# Patient Record
Sex: Male | Born: 1955
Health system: Southern US, Community
[De-identification: ages and names within clinical notes are randomized; demographics above are authoritative.]

## PROBLEM LIST (undated history)

## (undated) DIAGNOSIS — R0602 Shortness of breath: Secondary | ICD-10-CM

## (undated) DIAGNOSIS — G4730 Sleep apnea, unspecified: Secondary | ICD-10-CM

## (undated) DIAGNOSIS — I2510 Atherosclerotic heart disease of native coronary artery without angina pectoris: Secondary | ICD-10-CM

## (undated) DIAGNOSIS — I739 Peripheral vascular disease, unspecified: Secondary | ICD-10-CM

## (undated) DIAGNOSIS — C4490 Unspecified malignant neoplasm of skin, unspecified: Secondary | ICD-10-CM

## (undated) DIAGNOSIS — C3490 Malignant neoplasm of unspecified part of unspecified bronchus or lung: Secondary | ICD-10-CM

## (undated) DIAGNOSIS — I219 Acute myocardial infarction, unspecified: Secondary | ICD-10-CM

## (undated) DIAGNOSIS — I10 Essential (primary) hypertension: Secondary | ICD-10-CM

## (undated) DIAGNOSIS — D126 Benign neoplasm of colon, unspecified: Secondary | ICD-10-CM

## (undated) DIAGNOSIS — K219 Gastro-esophageal reflux disease without esophagitis: Secondary | ICD-10-CM

## (undated) DIAGNOSIS — E785 Hyperlipidemia, unspecified: Secondary | ICD-10-CM

## (undated) DIAGNOSIS — J449 Chronic obstructive pulmonary disease, unspecified: Secondary | ICD-10-CM

## (undated) HISTORY — DX: Gastro-esophageal reflux disease without esophagitis: K21.9

## (undated) HISTORY — PX: INGUINAL HERNIA REPAIR: SUR1180

## (undated) HISTORY — DX: Atherosclerotic heart disease of native coronary artery without angina pectoris: I25.10

## (undated) HISTORY — DX: Benign neoplasm of colon, unspecified: D12.6

## (undated) HISTORY — DX: Hyperlipidemia, unspecified: E78.5

## (undated) HISTORY — PX: HERNIA REPAIR: SHX51

## (undated) HISTORY — PX: ACHILLES TENDON SURGERY: SHX542

## (undated) HISTORY — DX: Peripheral vascular disease, unspecified: I73.9

## (undated) HISTORY — PX: LUNG SURGERY: SHX703

## (undated) HISTORY — PX: CORONARY ARTERY BYPASS GRAFT: SHX141

---

## 1998-01-26 ENCOUNTER — Emergency Department (HOSPITAL_COMMUNITY): Admission: EM | Admit: 1998-01-26 | Discharge: 1998-01-26 | Payer: Self-pay | Admitting: Emergency Medicine

## 1998-07-16 ENCOUNTER — Emergency Department (HOSPITAL_COMMUNITY): Admission: EM | Admit: 1998-07-16 | Discharge: 1998-07-16 | Payer: Self-pay | Admitting: Emergency Medicine

## 1998-07-16 ENCOUNTER — Encounter: Payer: Self-pay | Admitting: Emergency Medicine

## 1998-09-14 ENCOUNTER — Emergency Department (HOSPITAL_COMMUNITY): Admission: EM | Admit: 1998-09-14 | Discharge: 1998-09-14 | Payer: Self-pay | Admitting: Emergency Medicine

## 2000-02-26 ENCOUNTER — Emergency Department (HOSPITAL_COMMUNITY): Admission: EM | Admit: 2000-02-26 | Discharge: 2000-02-26 | Payer: Self-pay | Admitting: Emergency Medicine

## 2000-07-19 DIAGNOSIS — I219 Acute myocardial infarction, unspecified: Secondary | ICD-10-CM

## 2000-07-19 HISTORY — DX: Acute myocardial infarction, unspecified: I21.9

## 2000-08-16 ENCOUNTER — Inpatient Hospital Stay (HOSPITAL_COMMUNITY): Admission: EM | Admit: 2000-08-16 | Discharge: 2000-08-22 | Payer: Self-pay | Admitting: Emergency Medicine

## 2000-08-18 ENCOUNTER — Encounter: Payer: Self-pay | Admitting: Thoracic Surgery (Cardiothoracic Vascular Surgery)

## 2000-08-18 HISTORY — PX: CORONARY ARTERY BYPASS GRAFT: SHX141

## 2000-08-19 ENCOUNTER — Encounter: Payer: Self-pay | Admitting: Thoracic Surgery (Cardiothoracic Vascular Surgery)

## 2000-08-20 ENCOUNTER — Encounter: Payer: Self-pay | Admitting: Thoracic Surgery (Cardiothoracic Vascular Surgery)

## 2000-09-14 ENCOUNTER — Encounter (HOSPITAL_COMMUNITY): Admission: RE | Admit: 2000-09-14 | Discharge: 2000-12-13 | Payer: Self-pay | Admitting: Cardiology

## 2000-10-19 DIAGNOSIS — I219 Acute myocardial infarction, unspecified: Secondary | ICD-10-CM

## 2000-10-19 HISTORY — DX: Acute myocardial infarction, unspecified: I21.9

## 2000-10-19 HISTORY — PX: CORONARY ARTERY BYPASS GRAFT: SHX141

## 2000-10-31 ENCOUNTER — Encounter: Payer: Self-pay | Admitting: Emergency Medicine

## 2000-10-31 ENCOUNTER — Emergency Department (HOSPITAL_COMMUNITY): Admission: EM | Admit: 2000-10-31 | Discharge: 2000-10-31 | Payer: Self-pay | Admitting: Emergency Medicine

## 2000-11-30 ENCOUNTER — Encounter (HOSPITAL_COMMUNITY): Admission: RE | Admit: 2000-11-30 | Discharge: 2001-02-11 | Payer: Self-pay | Admitting: Cardiology

## 2001-04-27 ENCOUNTER — Ambulatory Visit (HOSPITAL_BASED_OUTPATIENT_CLINIC_OR_DEPARTMENT_OTHER): Admission: RE | Admit: 2001-04-27 | Discharge: 2001-04-27 | Payer: Self-pay | Admitting: *Deleted

## 2001-11-15 ENCOUNTER — Encounter (HOSPITAL_COMMUNITY): Admission: RE | Admit: 2001-11-15 | Discharge: 2002-02-13 | Payer: Self-pay | Admitting: Cardiology

## 2003-03-02 ENCOUNTER — Encounter: Admission: RE | Admit: 2003-03-02 | Discharge: 2003-03-02 | Payer: Self-pay | Admitting: Family Medicine

## 2003-03-02 ENCOUNTER — Encounter: Payer: Self-pay | Admitting: Family Medicine

## 2003-12-14 ENCOUNTER — Inpatient Hospital Stay (HOSPITAL_COMMUNITY): Admission: EM | Admit: 2003-12-14 | Discharge: 2003-12-18 | Payer: Self-pay | Admitting: Emergency Medicine

## 2004-01-24 ENCOUNTER — Ambulatory Visit (HOSPITAL_COMMUNITY): Admission: RE | Admit: 2004-01-24 | Discharge: 2004-01-24 | Payer: Self-pay | Admitting: Cardiology

## 2004-02-22 ENCOUNTER — Emergency Department (HOSPITAL_COMMUNITY): Admission: EM | Admit: 2004-02-22 | Discharge: 2004-02-22 | Payer: Self-pay | Admitting: Internal Medicine

## 2005-07-14 ENCOUNTER — Ambulatory Visit (HOSPITAL_COMMUNITY): Admission: RE | Admit: 2005-07-14 | Discharge: 2005-07-14 | Payer: Self-pay | Admitting: Gastroenterology

## 2005-07-14 ENCOUNTER — Encounter (INDEPENDENT_AMBULATORY_CARE_PROVIDER_SITE_OTHER): Payer: Self-pay | Admitting: *Deleted

## 2006-02-17 ENCOUNTER — Encounter: Payer: Self-pay | Admitting: *Deleted

## 2008-10-19 HISTORY — PX: LUNG SURGERY: SHX703

## 2008-12-25 ENCOUNTER — Emergency Department (HOSPITAL_COMMUNITY): Admission: EM | Admit: 2008-12-25 | Discharge: 2008-12-25 | Payer: Self-pay | Admitting: *Deleted

## 2008-12-31 ENCOUNTER — Ambulatory Visit: Admission: RE | Admit: 2008-12-31 | Discharge: 2008-12-31 | Payer: Self-pay | Admitting: Thoracic Surgery

## 2009-01-02 ENCOUNTER — Ambulatory Visit: Payer: Self-pay | Admitting: Thoracic Surgery

## 2009-01-02 ENCOUNTER — Ambulatory Visit (HOSPITAL_COMMUNITY): Admission: RE | Admit: 2009-01-02 | Discharge: 2009-01-02 | Payer: Self-pay | Admitting: Thoracic Surgery

## 2009-01-28 ENCOUNTER — Encounter: Payer: Self-pay | Admitting: Thoracic Surgery (Cardiothoracic Vascular Surgery)

## 2009-01-28 ENCOUNTER — Ambulatory Visit: Payer: Self-pay | Admitting: Thoracic Surgery (Cardiothoracic Vascular Surgery)

## 2009-01-28 ENCOUNTER — Ambulatory Visit: Payer: Self-pay | Admitting: Vascular Surgery

## 2009-01-29 ENCOUNTER — Inpatient Hospital Stay (HOSPITAL_COMMUNITY)
Admission: RE | Admit: 2009-01-29 | Discharge: 2009-02-04 | Payer: Self-pay | Admitting: Thoracic Surgery (Cardiothoracic Vascular Surgery)

## 2009-01-29 ENCOUNTER — Encounter: Payer: Self-pay | Admitting: Thoracic Surgery (Cardiothoracic Vascular Surgery)

## 2009-01-29 ENCOUNTER — Ambulatory Visit: Payer: Self-pay | Admitting: Thoracic Surgery (Cardiothoracic Vascular Surgery)

## 2009-01-29 HISTORY — PX: LUNG SURGERY: SHX703

## 2009-02-08 ENCOUNTER — Ambulatory Visit: Payer: Self-pay | Admitting: Thoracic Surgery (Cardiothoracic Vascular Surgery)

## 2009-02-13 ENCOUNTER — Ambulatory Visit: Payer: Self-pay | Admitting: Thoracic Surgery

## 2009-02-13 ENCOUNTER — Ambulatory Visit: Payer: Self-pay | Admitting: Internal Medicine

## 2009-02-13 ENCOUNTER — Encounter: Admission: RE | Admit: 2009-02-13 | Discharge: 2009-02-13 | Payer: Self-pay | Admitting: Thoracic Surgery

## 2009-02-25 LAB — COMPREHENSIVE METABOLIC PANEL
ALT: 29 U/L (ref 0–53)
BUN: 13 mg/dL (ref 6–23)
CO2: 21 mEq/L (ref 19–32)
Calcium: 9.3 mg/dL (ref 8.4–10.5)
Chloride: 107 mEq/L (ref 96–112)
Creatinine, Ser: 0.91 mg/dL (ref 0.40–1.50)
Glucose, Bld: 99 mg/dL (ref 70–99)
Potassium: 4.6 mEq/L (ref 3.5–5.3)
Sodium: 139 mEq/L (ref 135–145)

## 2009-02-25 LAB — CBC WITH DIFFERENTIAL/PLATELET
EOS%: 6.1 % (ref 0.0–7.0)
MCH: 33.3 pg (ref 27.2–33.4)
MCHC: 34.6 g/dL (ref 32.0–36.0)
MCV: 96.3 fL (ref 79.3–98.0)
MONO%: 8.4 % (ref 0.0–14.0)
RBC: 4.34 10*6/uL (ref 4.20–5.82)
RDW: 13.9 % (ref 11.0–14.6)

## 2009-03-04 ENCOUNTER — Ambulatory Visit: Payer: Self-pay | Admitting: Thoracic Surgery (Cardiothoracic Vascular Surgery)

## 2009-03-04 ENCOUNTER — Encounter
Admission: RE | Admit: 2009-03-04 | Discharge: 2009-03-04 | Payer: Self-pay | Admitting: Thoracic Surgery (Cardiothoracic Vascular Surgery)

## 2009-03-21 LAB — CBC WITH DIFFERENTIAL/PLATELET
Basophils Absolute: 0 10*3/uL (ref 0.0–0.1)
Eosinophils Absolute: 0 10*3/uL (ref 0.0–0.5)
HGB: 14.2 g/dL (ref 13.0–17.1)
MCV: 95.2 fL (ref 79.3–98.0)
MONO%: 1.6 % (ref 0.0–14.0)
NEUT#: 17.4 10*3/uL — ABNORMAL HIGH (ref 1.5–6.5)
RBC: 4.35 10*6/uL (ref 4.20–5.82)
RDW: 13.9 % (ref 11.0–14.6)
WBC: 18.9 10*3/uL — ABNORMAL HIGH (ref 4.0–10.3)
lymph#: 1.2 10*3/uL (ref 0.9–3.3)
nRBC: 0 % (ref 0–0)

## 2009-03-21 LAB — COMPREHENSIVE METABOLIC PANEL
ALT: 27 U/L (ref 0–53)
Albumin: 4 g/dL (ref 3.5–5.2)
Alkaline Phosphatase: 55 U/L (ref 39–117)
CO2: 18 mEq/L — ABNORMAL LOW (ref 19–32)
Glucose, Bld: 167 mg/dL — ABNORMAL HIGH (ref 70–99)
Potassium: 4.2 mEq/L (ref 3.5–5.3)
Sodium: 136 mEq/L (ref 135–145)
Total Bilirubin: 0.3 mg/dL (ref 0.3–1.2)
Total Protein: 6.6 g/dL (ref 6.0–8.3)

## 2009-03-21 LAB — MAGNESIUM: Magnesium: 2 mg/dL (ref 1.5–2.5)

## 2009-03-26 ENCOUNTER — Ambulatory Visit: Payer: Self-pay | Admitting: Internal Medicine

## 2009-03-26 ENCOUNTER — Ambulatory Visit: Payer: Self-pay | Admitting: Surgery

## 2009-03-26 ENCOUNTER — Encounter: Admission: RE | Admit: 2009-03-26 | Discharge: 2009-03-26 | Payer: Self-pay | Admitting: Surgery

## 2009-03-28 LAB — COMPREHENSIVE METABOLIC PANEL
Alkaline Phosphatase: 96 U/L (ref 39–117)
BUN: 34 mg/dL — ABNORMAL HIGH (ref 6–23)
CO2: 19 mEq/L (ref 19–32)
Creatinine, Ser: 1.46 mg/dL (ref 0.40–1.50)
Glucose, Bld: 97 mg/dL (ref 70–99)
Sodium: 137 mEq/L (ref 135–145)
Total Bilirubin: 0.4 mg/dL (ref 0.3–1.2)
Total Protein: 6.5 g/dL (ref 6.0–8.3)

## 2009-03-28 LAB — CBC WITH DIFFERENTIAL/PLATELET
BASO%: 0.8 % (ref 0.0–2.0)
EOS%: 0.6 % (ref 0.0–7.0)
HCT: 44.9 % (ref 38.4–49.9)
LYMPH%: 15.1 % (ref 14.0–49.0)
MCH: 33.7 pg — ABNORMAL HIGH (ref 27.2–33.4)
MCHC: 35 g/dL (ref 32.0–36.0)
NEUT%: 75.9 % — ABNORMAL HIGH (ref 39.0–75.0)
Platelets: 256 10*3/uL (ref 140–400)
RBC: 4.66 10*6/uL (ref 4.20–5.82)

## 2009-03-28 LAB — MAGNESIUM: Magnesium: 2.1 mg/dL (ref 1.5–2.5)

## 2009-04-04 LAB — CBC WITH DIFFERENTIAL/PLATELET
Basophils Absolute: 0.1 10*3/uL (ref 0.0–0.1)
EOS%: 0.3 % (ref 0.0–7.0)
HCT: 36.8 % — ABNORMAL LOW (ref 38.4–49.9)
HGB: 13.2 g/dL (ref 13.0–17.1)
MCHC: 35.8 g/dL (ref 32.0–36.0)
MONO%: 4.7 % (ref 0.0–14.0)
NEUT#: 7.9 10*3/uL — ABNORMAL HIGH (ref 1.5–6.5)
Platelets: 248 10*3/uL (ref 140–400)
RDW: 14 % (ref 11.0–14.6)
WBC: 10.1 10*3/uL (ref 4.0–10.3)

## 2009-04-04 LAB — COMPREHENSIVE METABOLIC PANEL
AST: 14 U/L (ref 0–37)
Albumin: 3.6 g/dL (ref 3.5–5.2)
Alkaline Phosphatase: 82 U/L (ref 39–117)
BUN: 16 mg/dL (ref 6–23)
Potassium: 4.1 mEq/L (ref 3.5–5.3)
Sodium: 139 mEq/L (ref 135–145)
Total Bilirubin: 0.4 mg/dL (ref 0.3–1.2)

## 2009-04-11 LAB — CBC WITH DIFFERENTIAL/PLATELET
Basophils Absolute: 0 10*3/uL (ref 0.0–0.1)
Eosinophils Absolute: 0 10*3/uL (ref 0.0–0.5)
HCT: 40.7 % (ref 38.4–49.9)
HGB: 14.4 g/dL (ref 13.0–17.1)
LYMPH%: 6 % — ABNORMAL LOW (ref 14.0–49.0)
MCV: 96.3 fL (ref 79.3–98.0)
MONO%: 0.9 % (ref 0.0–14.0)
NEUT#: 17.8 10*3/uL — ABNORMAL HIGH (ref 1.5–6.5)
NEUT%: 93 % — ABNORMAL HIGH (ref 39.0–75.0)
Platelets: 303 10*3/uL (ref 140–400)
RBC: 4.22 10*6/uL (ref 4.20–5.82)

## 2009-04-11 LAB — COMPREHENSIVE METABOLIC PANEL
ALT: 28 U/L (ref 0–53)
AST: 14 U/L (ref 0–37)
Albumin: 4.1 g/dL (ref 3.5–5.2)
BUN: 15 mg/dL (ref 6–23)
Calcium: 9.9 mg/dL (ref 8.4–10.5)
Chloride: 105 mEq/L (ref 96–112)
Potassium: 4.6 mEq/L (ref 3.5–5.3)
Total Protein: 6.8 g/dL (ref 6.0–8.3)

## 2009-04-18 LAB — COMPREHENSIVE METABOLIC PANEL
BUN: 18 mg/dL (ref 6–23)
CO2: 20 mEq/L (ref 19–32)
Glucose, Bld: 92 mg/dL (ref 70–99)
Sodium: 139 mEq/L (ref 135–145)
Total Bilirubin: 0.5 mg/dL (ref 0.3–1.2)
Total Protein: 6.4 g/dL (ref 6.0–8.3)

## 2009-04-18 LAB — CBC WITH DIFFERENTIAL/PLATELET
BASO%: 0.3 % (ref 0.0–2.0)
Eosinophils Absolute: 0.1 10*3/uL (ref 0.0–0.5)
HCT: 40.5 % (ref 38.4–49.9)
HGB: 14 g/dL (ref 13.0–17.1)
MCHC: 34.7 g/dL (ref 32.0–36.0)
MONO#: 0.8 10*3/uL (ref 0.1–0.9)
NEUT#: 9 10*3/uL — ABNORMAL HIGH (ref 1.5–6.5)
NEUT%: 74.2 % (ref 39.0–75.0)
WBC: 12.1 10*3/uL — ABNORMAL HIGH (ref 4.0–10.3)
lymph#: 2.2 10*3/uL (ref 0.9–3.3)

## 2009-04-18 LAB — MAGNESIUM: Magnesium: 1.6 mg/dL (ref 1.5–2.5)

## 2009-04-25 ENCOUNTER — Ambulatory Visit: Payer: Self-pay | Admitting: Internal Medicine

## 2009-04-25 LAB — COMPREHENSIVE METABOLIC PANEL
ALT: 30 U/L (ref 0–53)
CO2: 19 mEq/L (ref 19–32)
Calcium: 9.5 mg/dL (ref 8.4–10.5)
Chloride: 105 mEq/L (ref 96–112)
Sodium: 137 mEq/L (ref 135–145)
Total Protein: 6.4 g/dL (ref 6.0–8.3)

## 2009-04-25 LAB — CBC WITH DIFFERENTIAL/PLATELET
BASO%: 0.4 % (ref 0.0–2.0)
Eosinophils Absolute: 0 10*3/uL (ref 0.0–0.5)
HCT: 39.5 % (ref 38.4–49.9)
MCHC: 34.7 g/dL (ref 32.0–36.0)
MONO#: 0.6 10*3/uL (ref 0.1–0.9)
NEUT#: 7.7 10*3/uL — ABNORMAL HIGH (ref 1.5–6.5)
NEUT%: 75 % (ref 39.0–75.0)
RBC: 4.13 10*6/uL — ABNORMAL LOW (ref 4.20–5.82)
WBC: 10.2 10*3/uL (ref 4.0–10.3)
lymph#: 2 10*3/uL (ref 0.9–3.3)

## 2009-04-25 LAB — MAGNESIUM: Magnesium: 1.8 mg/dL (ref 1.5–2.5)

## 2009-05-02 LAB — CBC WITH DIFFERENTIAL/PLATELET
BASO%: 0.7 % (ref 0.0–2.0)
EOS%: 0.2 % (ref 0.0–7.0)
MCH: 32.3 pg (ref 27.2–33.4)
MCHC: 34.4 g/dL (ref 32.0–36.0)
RBC: 4.09 10*6/uL — ABNORMAL LOW (ref 4.20–5.82)
RDW: 15.8 % — ABNORMAL HIGH (ref 11.0–14.6)
lymph#: 1.9 10*3/uL (ref 0.9–3.3)

## 2009-05-02 LAB — COMPREHENSIVE METABOLIC PANEL
Albumin: 3.9 g/dL (ref 3.5–5.2)
BUN: 15 mg/dL (ref 6–23)
CO2: 22 mEq/L (ref 19–32)
Calcium: 9.5 mg/dL (ref 8.4–10.5)
Chloride: 108 mEq/L (ref 96–112)
Glucose, Bld: 94 mg/dL (ref 70–99)
Potassium: 4.7 mEq/L (ref 3.5–5.3)
Sodium: 140 mEq/L (ref 135–145)
Total Protein: 5.9 g/dL — ABNORMAL LOW (ref 6.0–8.3)

## 2009-05-27 ENCOUNTER — Ambulatory Visit: Payer: Self-pay | Admitting: Internal Medicine

## 2009-05-27 LAB — COMPREHENSIVE METABOLIC PANEL
BUN: 17 mg/dL (ref 6–23)
CO2: 18 mEq/L — ABNORMAL LOW (ref 19–32)
Creatinine, Ser: 0.94 mg/dL (ref 0.40–1.50)
Glucose, Bld: 157 mg/dL — ABNORMAL HIGH (ref 70–99)
Total Bilirubin: 0.5 mg/dL (ref 0.3–1.2)
Total Protein: 6.6 g/dL (ref 6.0–8.3)

## 2009-05-27 LAB — CBC WITH DIFFERENTIAL/PLATELET
Basophils Absolute: 0 10*3/uL (ref 0.0–0.1)
Eosinophils Absolute: 0 10*3/uL (ref 0.0–0.5)
HCT: 38.6 % (ref 38.4–49.9)
LYMPH%: 9 % — ABNORMAL LOW (ref 14.0–49.0)
MCV: 98.7 fL — ABNORMAL HIGH (ref 79.3–98.0)
MONO#: 0.3 10*3/uL (ref 0.1–0.9)
MONO%: 2.9 % (ref 0.0–14.0)
NEUT#: 10 10*3/uL — ABNORMAL HIGH (ref 1.5–6.5)
NEUT%: 88 % — ABNORMAL HIGH (ref 39.0–75.0)
Platelets: 296 10*3/uL (ref 140–400)
WBC: 11.4 10*3/uL — ABNORMAL HIGH (ref 4.0–10.3)

## 2009-05-27 LAB — MAGNESIUM: Magnesium: 2 mg/dL (ref 1.5–2.5)

## 2009-06-13 ENCOUNTER — Ambulatory Visit (HOSPITAL_COMMUNITY): Admission: RE | Admit: 2009-06-13 | Discharge: 2009-06-13 | Payer: Self-pay | Admitting: Internal Medicine

## 2009-06-25 LAB — COMPREHENSIVE METABOLIC PANEL
Alkaline Phosphatase: 60 U/L (ref 39–117)
CO2: 19 mEq/L (ref 19–32)
Creatinine, Ser: 0.92 mg/dL (ref 0.40–1.50)
Glucose, Bld: 95 mg/dL (ref 70–99)
Sodium: 139 mEq/L (ref 135–145)
Total Bilirubin: 0.6 mg/dL (ref 0.3–1.2)

## 2009-06-25 LAB — CBC WITH DIFFERENTIAL/PLATELET
BASO%: 0.3 % (ref 0.0–2.0)
Eosinophils Absolute: 0.1 10*3/uL (ref 0.0–0.5)
HCT: 37.6 % — ABNORMAL LOW (ref 38.4–49.9)
LYMPH%: 22.5 % (ref 14.0–49.0)
MCHC: 34.7 g/dL (ref 32.0–36.0)
MCV: 102.1 fL — ABNORMAL HIGH (ref 79.3–98.0)
MONO#: 0.5 10*3/uL (ref 0.1–0.9)
MONO%: 7.9 % (ref 0.0–14.0)
NEUT%: 67.3 % (ref 39.0–75.0)
Platelets: 375 10*3/uL (ref 140–400)
RBC: 3.69 10*6/uL — ABNORMAL LOW (ref 4.20–5.82)
WBC: 6.1 10*3/uL (ref 4.0–10.3)

## 2009-10-02 ENCOUNTER — Ambulatory Visit: Payer: Self-pay | Admitting: Internal Medicine

## 2009-10-04 LAB — COMPREHENSIVE METABOLIC PANEL
ALT: 74 U/L — ABNORMAL HIGH (ref 0–53)
Alkaline Phosphatase: 50 U/L (ref 39–117)
Creatinine, Ser: 0.93 mg/dL (ref 0.40–1.50)
Sodium: 139 mEq/L (ref 135–145)
Total Bilirubin: 0.7 mg/dL (ref 0.3–1.2)
Total Protein: 6.6 g/dL (ref 6.0–8.3)

## 2009-10-04 LAB — CBC WITH DIFFERENTIAL/PLATELET
BASO%: 0.6 % (ref 0.0–2.0)
EOS%: 3 % (ref 0.0–7.0)
HCT: 41.6 % (ref 38.4–49.9)
LYMPH%: 24.9 % (ref 14.0–49.0)
MCH: 33.7 pg — ABNORMAL HIGH (ref 27.2–33.4)
MCHC: 34.5 g/dL (ref 32.0–36.0)
MCV: 97.6 fL (ref 79.3–98.0)
MONO#: 0.9 10*3/uL (ref 0.1–0.9)
MONO%: 11.3 % (ref 0.0–14.0)
NEUT%: 60.2 % (ref 39.0–75.0)
Platelets: 253 10*3/uL (ref 140–400)
RBC: 4.27 10*6/uL (ref 4.20–5.82)
WBC: 7.7 10*3/uL (ref 4.0–10.3)

## 2009-10-07 ENCOUNTER — Ambulatory Visit (HOSPITAL_COMMUNITY): Admission: RE | Admit: 2009-10-07 | Discharge: 2009-10-07 | Payer: Self-pay | Admitting: Internal Medicine

## 2009-12-30 ENCOUNTER — Ambulatory Visit: Payer: Self-pay | Admitting: Internal Medicine

## 2010-01-02 ENCOUNTER — Ambulatory Visit (HOSPITAL_COMMUNITY): Admission: RE | Admit: 2010-01-02 | Discharge: 2010-01-02 | Payer: Self-pay | Admitting: Internal Medicine

## 2010-01-02 LAB — COMPREHENSIVE METABOLIC PANEL
ALT: 106 U/L — ABNORMAL HIGH (ref 0–53)
Albumin: 3.8 g/dL (ref 3.5–5.2)
CO2: 23 mEq/L (ref 19–32)
Potassium: 4.4 mEq/L (ref 3.5–5.3)
Sodium: 136 mEq/L (ref 135–145)
Total Bilirubin: 0.8 mg/dL (ref 0.3–1.2)
Total Protein: 6.7 g/dL (ref 6.0–8.3)

## 2010-01-02 LAB — CBC WITH DIFFERENTIAL/PLATELET
BASO%: 0.4 % (ref 0.0–2.0)
Eosinophils Absolute: 0.2 10*3/uL (ref 0.0–0.5)
LYMPH%: 22.5 % (ref 14.0–49.0)
MCHC: 34.9 g/dL (ref 32.0–36.0)
MONO#: 0.5 10*3/uL (ref 0.1–0.9)
NEUT#: 5.1 10*3/uL (ref 1.5–6.5)
Platelets: 224 10*3/uL (ref 140–400)
RBC: 4.34 10*6/uL (ref 4.20–5.82)
RDW: 14.4 % (ref 11.0–14.6)
WBC: 7.4 10*3/uL (ref 4.0–10.3)
lymph#: 1.7 10*3/uL (ref 0.9–3.3)

## 2010-05-01 ENCOUNTER — Ambulatory Visit: Payer: Self-pay | Admitting: Internal Medicine

## 2010-05-05 ENCOUNTER — Ambulatory Visit (HOSPITAL_COMMUNITY): Admission: RE | Admit: 2010-05-05 | Discharge: 2010-05-05 | Payer: Self-pay | Admitting: Internal Medicine

## 2010-05-05 LAB — CBC WITH DIFFERENTIAL/PLATELET
BASO%: 0.6 % (ref 0.0–2.0)
EOS%: 1.7 % (ref 0.0–7.0)
HCT: 41.5 % (ref 38.4–49.9)
LYMPH%: 19.8 % (ref 14.0–49.0)
MCH: 32.6 pg (ref 27.2–33.4)
MCHC: 34.4 g/dL (ref 32.0–36.0)
MCV: 94.7 fL (ref 79.3–98.0)
MONO#: 0.4 10*3/uL (ref 0.1–0.9)
MONO%: 5.2 % (ref 0.0–14.0)
NEUT%: 72.7 % (ref 39.0–75.0)
Platelets: 251 10*3/uL (ref 140–400)
RBC: 4.39 10*6/uL (ref 4.20–5.82)
WBC: 7.4 10*3/uL (ref 4.0–10.3)

## 2010-05-05 LAB — COMPREHENSIVE METABOLIC PANEL
ALT: 92 U/L — ABNORMAL HIGH (ref 0–53)
Albumin: 3.8 g/dL (ref 3.5–5.2)
CO2: 24 mEq/L (ref 19–32)
Calcium: 9.6 mg/dL (ref 8.4–10.5)
Chloride: 108 mEq/L (ref 96–112)
Glucose, Bld: 103 mg/dL — ABNORMAL HIGH (ref 70–99)
Sodium: 139 mEq/L (ref 135–145)
Total Protein: 6.8 g/dL (ref 6.0–8.3)

## 2010-08-13 ENCOUNTER — Inpatient Hospital Stay (HOSPITAL_COMMUNITY): Admission: EM | Admit: 2010-08-13 | Discharge: 2010-08-15 | Payer: Self-pay | Admitting: Emergency Medicine

## 2010-08-13 ENCOUNTER — Ambulatory Visit: Payer: Self-pay | Admitting: Vascular Surgery

## 2010-08-13 ENCOUNTER — Encounter (INDEPENDENT_AMBULATORY_CARE_PROVIDER_SITE_OTHER): Payer: Self-pay | Admitting: Internal Medicine

## 2010-09-02 ENCOUNTER — Ambulatory Visit: Payer: Self-pay | Admitting: Internal Medicine

## 2010-09-04 ENCOUNTER — Ambulatory Visit (HOSPITAL_COMMUNITY): Admission: RE | Admit: 2010-09-04 | Discharge: 2010-09-04 | Payer: Self-pay | Admitting: Internal Medicine

## 2010-09-04 LAB — CBC WITH DIFFERENTIAL/PLATELET
BASO%: 0.7 % (ref 0.0–2.0)
LYMPH%: 21.8 % (ref 14.0–49.0)
MCHC: 35.1 g/dL (ref 32.0–36.0)
MCV: 95.9 fL (ref 79.3–98.0)
MONO#: 0.4 10*3/uL (ref 0.1–0.9)
MONO%: 7.5 % (ref 0.0–14.0)
Platelets: 226 10*3/uL (ref 140–400)
RBC: 3.9 10*6/uL — ABNORMAL LOW (ref 4.20–5.82)
RDW: 14.5 % (ref 11.0–14.6)
WBC: 6 10*3/uL (ref 4.0–10.3)

## 2010-09-04 LAB — COMPREHENSIVE METABOLIC PANEL
ALT: 61 U/L — ABNORMAL HIGH (ref 0–53)
Alkaline Phosphatase: 53 U/L (ref 39–117)
Sodium: 141 mEq/L (ref 135–145)
Total Bilirubin: 0.6 mg/dL (ref 0.3–1.2)
Total Protein: 6.3 g/dL (ref 6.0–8.3)

## 2010-10-30 IMAGING — PT NM PET TUM IMG INITIAL (PI) SKULL BASE T - THIGH
7 series · 25 of 25 positions shown · non-contrast
Comparison: 12/25/2008

CLINICAL DATA: Left lower lobe mass.

NUCLEAR MEDICINE PET/CT
TECHNIQUE: 15.5 mCi F-18 FDG was injected intravenously.  Full-
ring PET imaging was performed from the skull base through the mid-
thighs.  CT data was obtained and used for attenuation correction
and anatomic localization only.  (This was not acquired as a
diagnostic CT examination.)  Data regarding site of injection,
patient weight, post injection waiting period, and fasting blood
sugar is available in the PACS documentation.

[Series 1: pet ac · axial · 3.3mm · 4.69mm/px · z∈[-870,+0]mm · 6 of 267 slices shown]
[im 1/267]
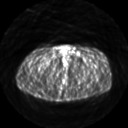
[im 54/267]
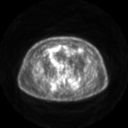
[im 107/267]
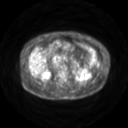
[im 160/267]
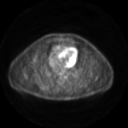
[im 213/267]
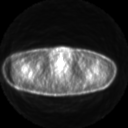
[im 267/267]
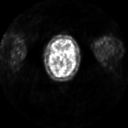

[Series 2: pet nac · axial · 3.3mm · 4.69mm/px · z∈[-870,+0]mm · 5 of 267 slices shown]
[im 1/267]
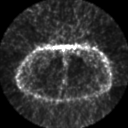
[im 67/267]
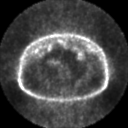
[im 134/267]
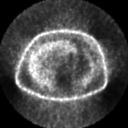
[im 200/267]
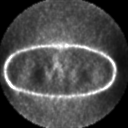
[im 267/267]
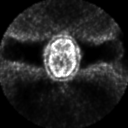

[Series 2: ct images · axial · 3.8mm · 0.98mm/px · z∈[-870,+0]mm · 5 of 267 slices shown]
[im 1/267]
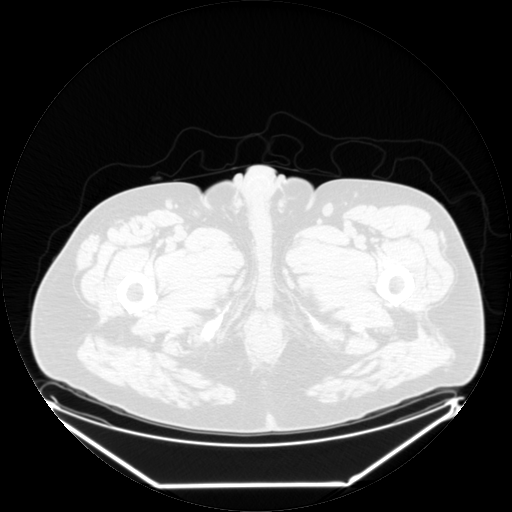
[im 67/267]
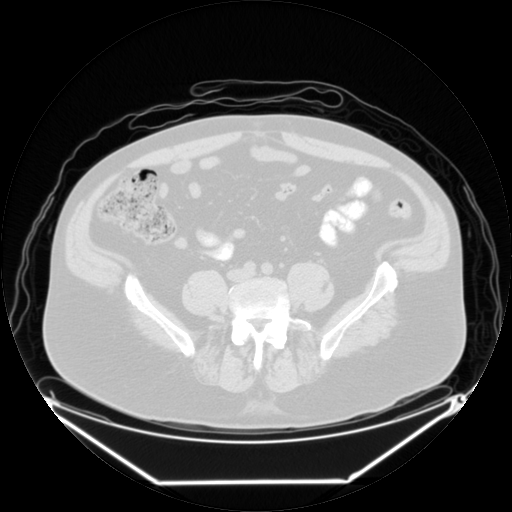
[im 134/267]
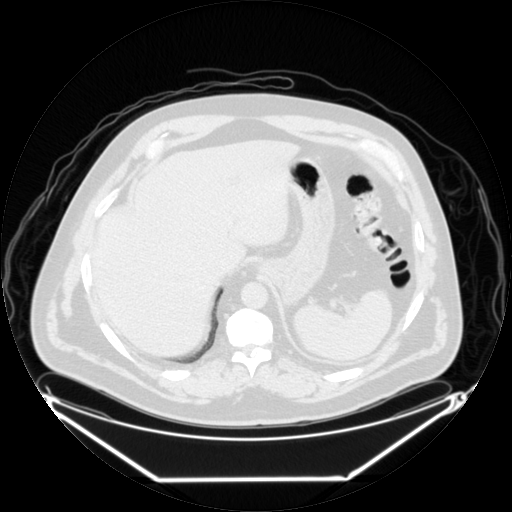
[im 200/267]
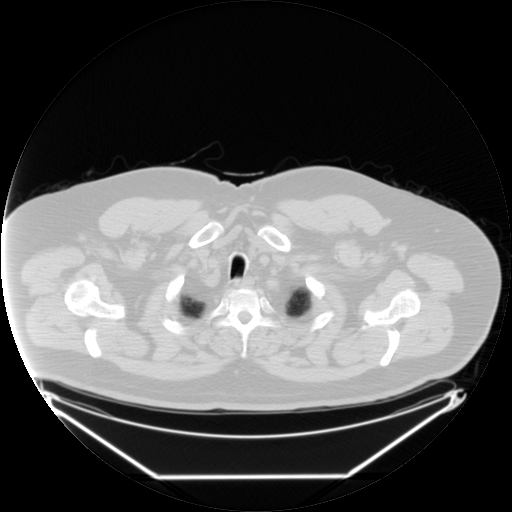
[im 267/267  brain]
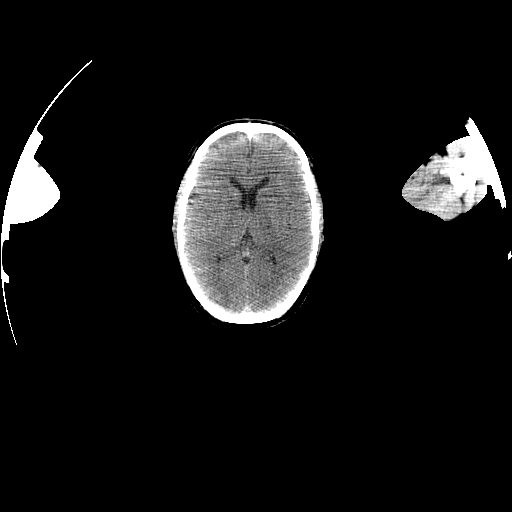

[Series 123: mip · coronal · 3.3mm · 4.69mm/px · 1 of 30 slices shown]
[im 1/30]
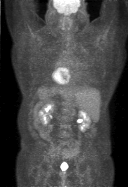

[Series 151: reformatted · axial · 3.3mm · 3.91mm/px · z∈[-324,+0]mm · 2 of 100 slices shown (1 of 3)]
[im 1/100]
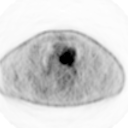
[im 100/100]
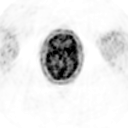

[Series 151: reformatted · axial · 3.3mm · 3.91mm/px · z∈[-870,+0]mm · 5 of 267 slices shown (2 of 3)]
[im 1/267]
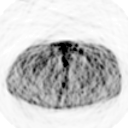
[im 67/267]
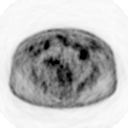
[im 134/267]
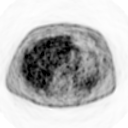
[im 200/267]
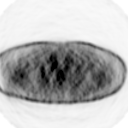
[im 267/267]
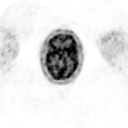

[Series 153: reformatted · coronal · 4.7mm · 6.98mm/px · 1 of 74 slices shown (3 of 3)]
[im 1/74]
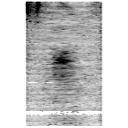

[25 of 25 positions shown; findings below may reference images not displayed]

FINDINGS: Hypermetabolic soft tissue in the vicinity of the
adenoids has a maximum standard uptake value of 9.6.  The soft
tissue density in this vicinity appears relatively symmetric.

A mucous retention cyst is present in the left maxillary sinus.

No adenopathy is identified in the neck.

Activity in the left lower lobe mass is similar to that in the
adjacent pulmonary vascular structures.  Maximum standard uptake
value is 2.8.  No pathologic hypermetabolic mediastinal or hilar
activity is noted.  Prior CABG is noted.

The adrenal glands appear normal.  Lumbar spondylosis noted.  Small
bilateral inguinal lymph nodes are not pathologically enlarged or
hypermetabolic.  Adipose tissue is incidentally noted in the
muscularis appropriate of the urinary bladder, a normal variant.

Lumbar spondylosis is present at L4-5 and L5-S1, with spurring of
the sacroiliac joints (right greater than left) and spurring of the
acetabula.

The adrenal glands appear unremarkable.  No specific hypermetabolic
lesions in the neck, abdomen, or pelvis identified.
IMPRESSION: 1.  Low grade but abnormal activity in the left lower lobe mass.
Although this could be infectious/inflammatory, the possibility of
low grade malignancy or so-called bronchial adenoma associated with
the superior segmental branch of the left lower lobe is raised.
Resection or tissue diagnosis is recommended.
2.  Hypermetabolic activity in the adenoids, of uncertain
significance.  Occasionally adenoidal tissue can persist into
adulthood and, when inflamed, be hypermetabolic.  We do not
demonstrate an asymmetric mass of the adenoids, but visual
inspection and follow-up may be warranted in order to exclude
malignancy.

## 2010-10-30 IMAGING — CT CT HEAD WO/W CM
1 of 3 series · 12 of 30 positions shown, 15 images · IV contrast (agent unspecified)
Comparison: Unenhanced CT 02/22/2004

CLINICAL DATA: Lung mass - evaluate for metastatic disease

CT HEAD WITHOUT AND WITH CONTRAST
TECHNIQUE: Contiguous axial images were obtained from the base of
the skull through the vertex without and with intravenous contrast.
Contrast: 100 ml Fmnipaque-3EE

[Series 3: head w/cm 4.8 h45s · axial · 0.43mm/px · z∈[-121,-7]mm · 12 of 30 slices shown, 15 images]
[im 3/30  brain]
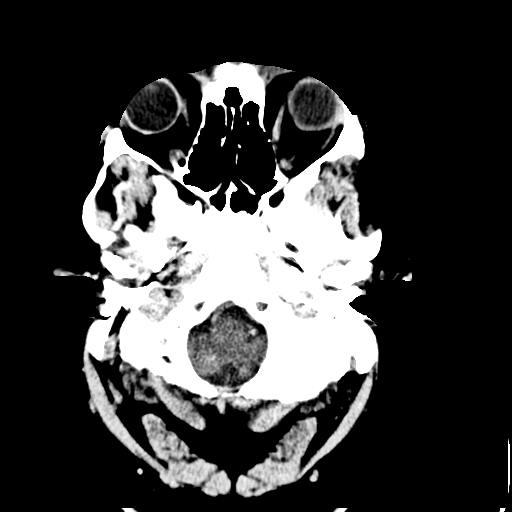
[im 3/30  bone]
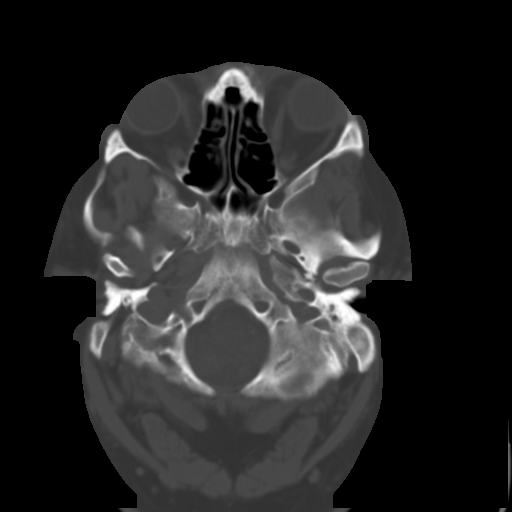
[im 5/30  brain]
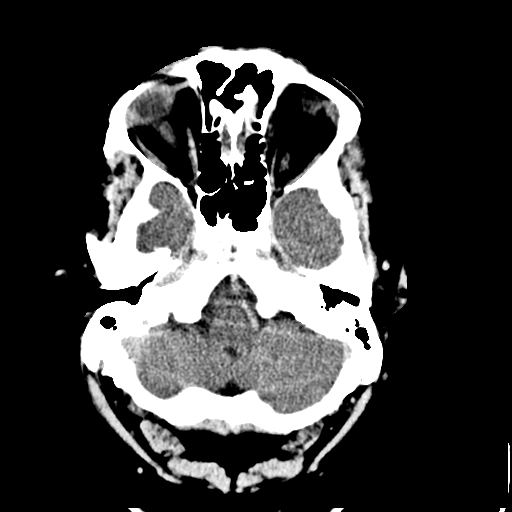
[im 7/30  brain]
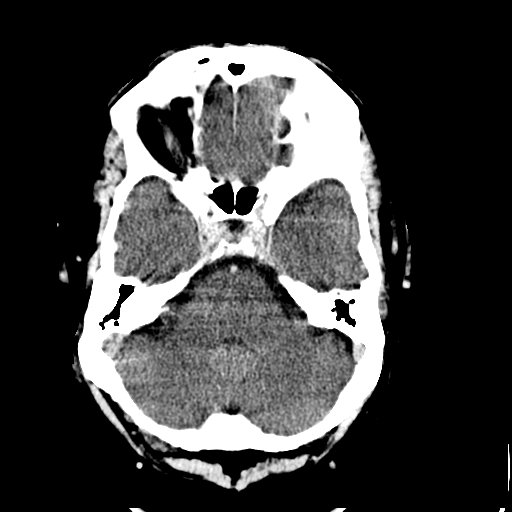
[im 9/30  brain]
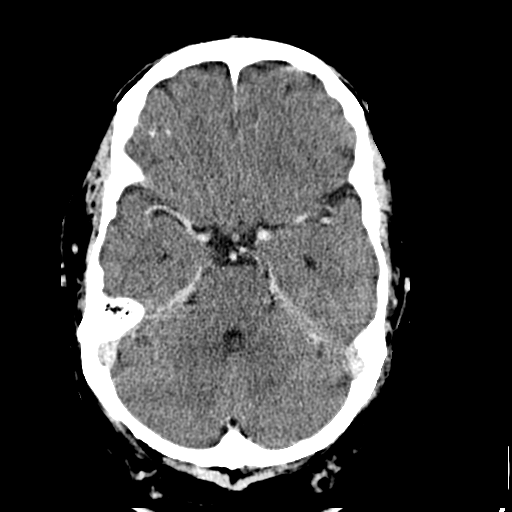
[im 12/30  brain]
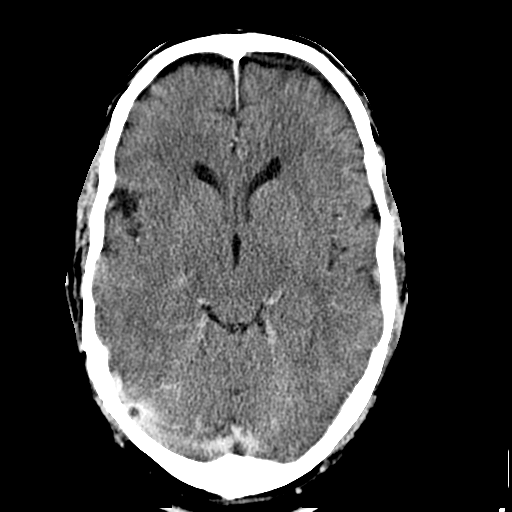
[im 12/30  bone]
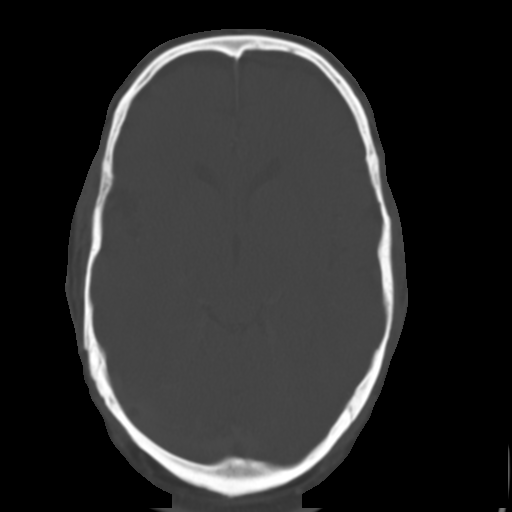
[im 14/30  brain]
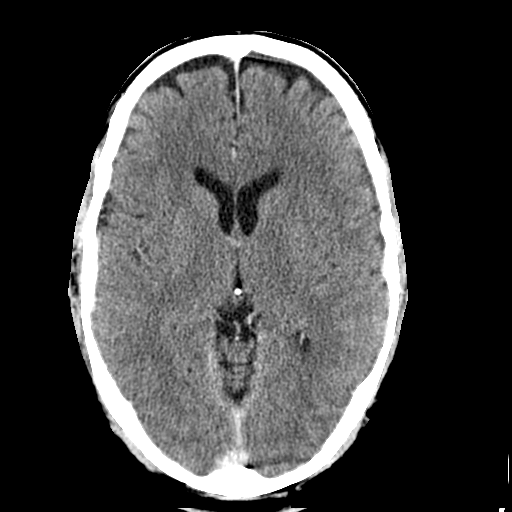
[im 16/30  brain]
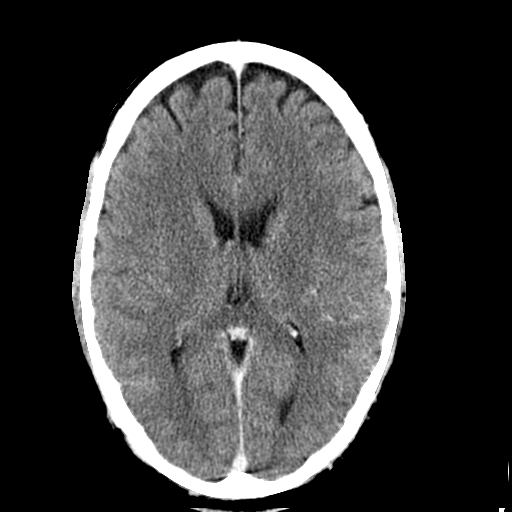
[im 18/30  brain]
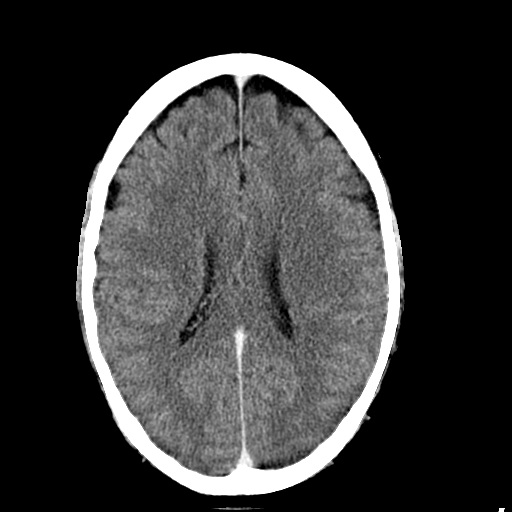
[im 21/30  brain]
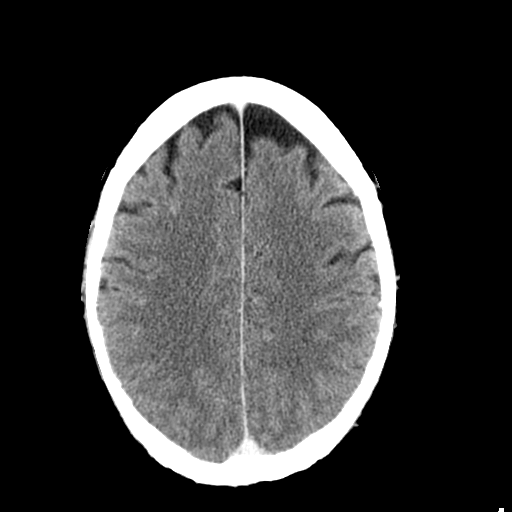
[im 21/30  bone]
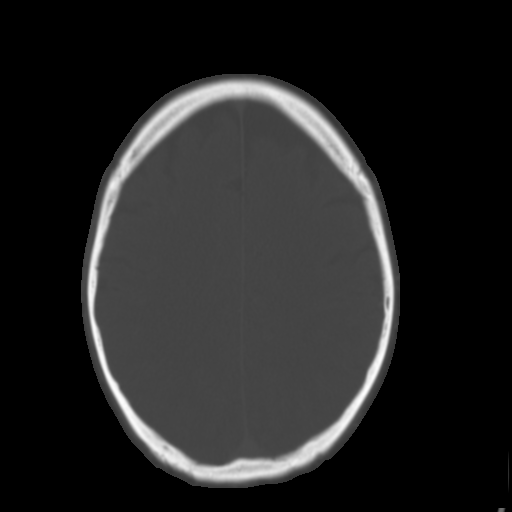
[im 23/30  brain]
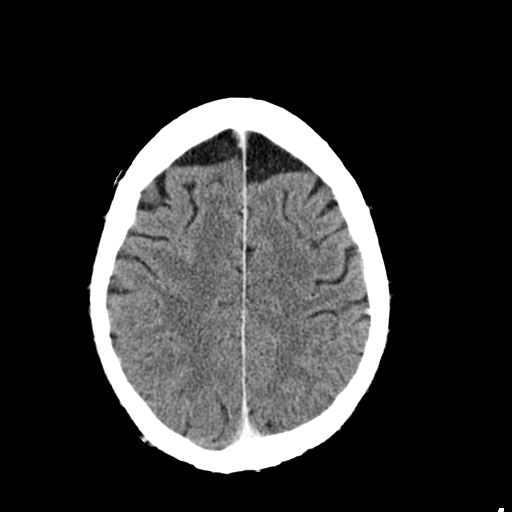
[im 25/30  brain]
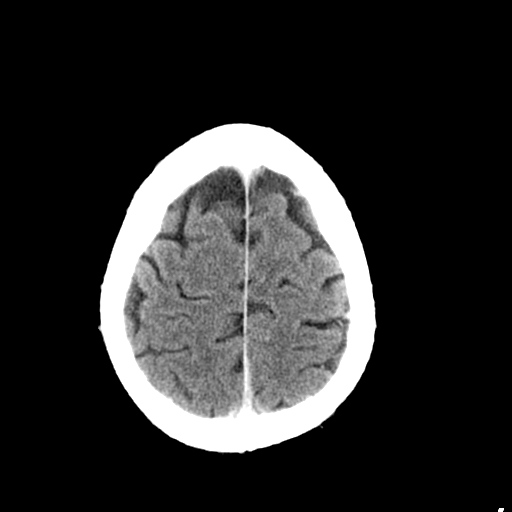
[im 27/30  brain]
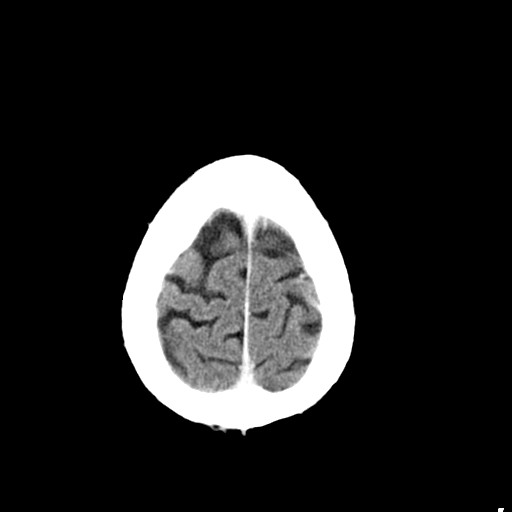

[12 of 30 positions shown; findings below may reference images not displayed]

FINDINGS: Ventricular size and CSF spaces within normal limits.  No
evidence for focal mass lesions or abnormal enhancement.  No
evidence for bleed or infarct.

Calvarium intact without obvious metastases.
IMPRESSION: 1.  No evidence for cerebral metastases.
2.  No other acute or significant findings.

## 2010-11-07 ENCOUNTER — Other Ambulatory Visit: Payer: Self-pay | Admitting: Internal Medicine

## 2010-11-07 DIAGNOSIS — C3490 Malignant neoplasm of unspecified part of unspecified bronchus or lung: Secondary | ICD-10-CM

## 2010-11-09 ENCOUNTER — Encounter: Payer: Self-pay | Admitting: Internal Medicine

## 2010-11-10 ENCOUNTER — Encounter: Payer: Self-pay | Admitting: Thoracic Surgery (Cardiothoracic Vascular Surgery)

## 2010-11-13 ENCOUNTER — Other Ambulatory Visit (HOSPITAL_COMMUNITY): Payer: Self-pay

## 2010-12-31 LAB — CARDIAC PANEL(CRET KIN+CKTOT+MB+TROPI)
CK, MB: 40.2 ng/mL (ref 0.3–4.0)
Relative Index: 10.1 — ABNORMAL HIGH (ref 0.0–2.5)
Total CK: 431 U/L — ABNORMAL HIGH (ref 7–232)
Troponin I: 2.09 ng/mL (ref 0.00–0.06)
Troponin I: 7.13 ng/mL (ref 0.00–0.06)

## 2010-12-31 LAB — CK TOTAL AND CKMB (NOT AT ARMC)
CK, MB: 11.6 ng/mL (ref 0.3–4.0)
Relative Index: 5.6 — ABNORMAL HIGH (ref 0.0–2.5)
Relative Index: 6.4 — ABNORMAL HIGH (ref 0.0–2.5)
Total CK: 297 U/L — ABNORMAL HIGH (ref 7–232)

## 2010-12-31 LAB — BASIC METABOLIC PANEL
Calcium: 8.7 mg/dL (ref 8.4–10.5)
GFR calc non Af Amer: >60 mL/min (ref >60)
GFR calc non Af Amer: >60 mL/min (ref >60)
Glucose, Bld: 91 mg/dL (ref 70–99)
Potassium: 4.1 mEq/L (ref 3.5–5.1)
Sodium: 136 mEq/L (ref 135–145)
Sodium: 139 mEq/L (ref 135–145)

## 2010-12-31 LAB — D-DIMER, QUANTITATIVE: D-Dimer, Quant: 0.72 ug/mL-FEU — ABNORMAL HIGH (ref 0.00–0.48)

## 2010-12-31 LAB — LIPID PANEL
LDL Cholesterol: 154 mg/dL — ABNORMAL HIGH (ref 0–99)
Total CHOL/HDL Ratio: 7.9 RATIO
VLDL: 33 mg/dL (ref 0–40)

## 2010-12-31 LAB — POCT I-STAT, CHEM 8
Creatinine, Ser: 0.9 mg/dL (ref 0.4–1.5)
Hemoglobin: 14.6 g/dL (ref 13.0–17.0)
Sodium: 140 mEq/L (ref 135–145)
TCO2: 25 mmol/L (ref 0–100)

## 2010-12-31 LAB — DIFFERENTIAL
Basophils Absolute: 0 10*3/uL (ref 0.0–0.1)
Eosinophils Absolute: 0.2 10*3/uL (ref 0.0–0.7)
Eosinophils Relative: 3 % (ref 0–5)
Lymphocytes Relative: 24 % (ref 12–46)
Monocytes Absolute: 0.6 10*3/uL (ref 0.1–1.0)
Monocytes Relative: 8 % (ref 3–12)
Neutro Abs: 4.8 10*3/uL (ref 1.7–7.7)

## 2010-12-31 LAB — CBC
HCT: 38.3 % — ABNORMAL LOW (ref 39.0–52.0)
Hemoglobin: 12.9 g/dL — ABNORMAL LOW (ref 13.0–17.0)
Hemoglobin: 13.3 g/dL (ref 13.0–17.0)
MCH: 32.9 pg (ref 26.0–34.0)
MCHC: 33.4 g/dL (ref 30.0–36.0)
MCHC: 33.4 g/dL (ref 30.0–36.0)
MCV: 98.4 fL (ref 78.0–100.0)
Platelets: 222 10*3/uL (ref 150–400)
RDW: 14.8 % (ref 11.5–15.5)
RDW: 14.9 % (ref 11.5–15.5)
WBC: 7.2 10*3/uL (ref 4.0–10.5)
WBC: 8.3 10*3/uL (ref 4.0–10.5)

## 2010-12-31 LAB — HEMOGLOBIN A1C
Hgb A1c MFr Bld: 5.4 % (ref <5.7)
Mean Plasma Glucose: 108 mg/dL (ref <117)

## 2010-12-31 LAB — POCT CARDIAC MARKERS

## 2010-12-31 LAB — PLATELET COUNT: Platelets: 207 10*3/uL (ref 150–400)

## 2010-12-31 LAB — APTT: aPTT: 28 seconds (ref 24–37)

## 2010-12-31 LAB — PROTIME-INR
INR: 0.93 (ref 0.00–1.49)
Prothrombin Time: 12.7 seconds (ref 11.6–15.2)

## 2010-12-31 LAB — MRSA PCR SCREENING: MRSA by PCR: NEGATIVE

## 2010-12-31 LAB — BRAIN NATRIURETIC PEPTIDE: Pro B Natriuretic peptide (BNP): 47 pg/mL (ref 0.0–100.0)

## 2011-01-15 ENCOUNTER — Other Ambulatory Visit (HOSPITAL_COMMUNITY): Payer: Self-pay

## 2011-01-19 ENCOUNTER — Encounter (HOSPITAL_BASED_OUTPATIENT_CLINIC_OR_DEPARTMENT_OTHER): Payer: 59 | Admitting: Internal Medicine

## 2011-01-19 ENCOUNTER — Ambulatory Visit (HOSPITAL_COMMUNITY)
Admission: RE | Admit: 2011-01-19 | Discharge: 2011-01-19 | Disposition: A | Discharge status: Home / Self Care | Payer: 59 | Source: Ambulatory Visit | Attending: Internal Medicine | Admitting: Internal Medicine

## 2011-01-19 ENCOUNTER — Encounter (HOSPITAL_COMMUNITY): Payer: Self-pay

## 2011-01-19 ENCOUNTER — Other Ambulatory Visit: Payer: Self-pay | Admitting: Internal Medicine

## 2011-01-19 DIAGNOSIS — Z951 Presence of aortocoronary bypass graft: Secondary | ICD-10-CM | POA: Insufficient documentation

## 2011-01-19 DIAGNOSIS — Z85118 Personal history of other malignant neoplasm of bronchus and lung: Secondary | ICD-10-CM | POA: Insufficient documentation

## 2011-01-19 DIAGNOSIS — C3430 Malignant neoplasm of lower lobe, unspecified bronchus or lung: Secondary | ICD-10-CM

## 2011-01-19 DIAGNOSIS — J90 Pleural effusion, not elsewhere classified: Secondary | ICD-10-CM | POA: Insufficient documentation

## 2011-01-19 DIAGNOSIS — Z09 Encounter for follow-up examination after completed treatment for conditions other than malignant neoplasm: Secondary | ICD-10-CM | POA: Insufficient documentation

## 2011-01-19 DIAGNOSIS — R0602 Shortness of breath: Secondary | ICD-10-CM | POA: Insufficient documentation

## 2011-01-19 DIAGNOSIS — C3490 Malignant neoplasm of unspecified part of unspecified bronchus or lung: Secondary | ICD-10-CM

## 2011-01-19 HISTORY — DX: Essential (primary) hypertension: I10

## 2011-01-19 HISTORY — DX: Malignant neoplasm of unspecified part of unspecified bronchus or lung: C34.90

## 2011-01-19 LAB — CMP (CANCER CENTER ONLY)
AST: 38 U/L (ref 11–38)
BUN, Bld: 12 mg/dL (ref 7–22)
Calcium: 9 mg/dL (ref 8.0–10.3)
Chloride: 102 mEq/L (ref 98–108)
Creat: 1 mg/dl (ref 0.6–1.2)
Glucose, Bld: 122 mg/dL — ABNORMAL HIGH (ref 73–118)

## 2011-01-19 LAB — CBC WITH DIFFERENTIAL/PLATELET
Basophils Absolute: 0 10*3/uL (ref 0.0–0.1)
EOS%: 2.2 % (ref 0.0–7.0)
HCT: 42 % (ref 38.4–49.9)
HGB: 14.6 g/dL (ref 13.0–17.1)
MCH: 32.9 pg (ref 27.2–33.4)
MCV: 94.4 fL (ref 79.3–98.0)
NEUT%: 66 % (ref 39.0–75.0)
lymph#: 1.5 10*3/uL (ref 0.9–3.3)

## 2011-01-19 MED ORDER — IOHEXOL 300 MG/ML  SOLN: 80.0000 mL | Freq: Once | INTRAMUSCULAR | Status: AC | PRN

## 2011-01-22 ENCOUNTER — Encounter (HOSPITAL_BASED_OUTPATIENT_CLINIC_OR_DEPARTMENT_OTHER): Payer: 59 | Admitting: Internal Medicine

## 2011-01-22 ENCOUNTER — Other Ambulatory Visit: Payer: Self-pay | Admitting: Internal Medicine

## 2011-01-22 DIAGNOSIS — C7A090 Malignant carcinoid tumor of the bronchus and lung: Secondary | ICD-10-CM

## 2011-01-22 DIAGNOSIS — C3490 Malignant neoplasm of unspecified part of unspecified bronchus or lung: Secondary | ICD-10-CM

## 2011-01-28 LAB — URINE MICROSCOPIC-ADD ON

## 2011-01-28 LAB — CBC
HCT: 37.7 % — ABNORMAL LOW (ref 39.0–52.0)
Hemoglobin: 13.1 g/dL (ref 13.0–17.0)
Hemoglobin: 12.9 g/dL — ABNORMAL LOW (ref 13.0–17.0)
Hemoglobin: 15.1 g/dL (ref 13.0–17.0)
MCHC: 34.5 g/dL (ref 30.0–36.0)
MCV: 99.5 fL (ref 78.0–100.0)
RBC: 3.8 MIL/uL — ABNORMAL LOW (ref 4.22–5.81)
RBC: 3.76 MIL/uL — ABNORMAL LOW (ref 4.22–5.81)
RBC: 4.36 MIL/uL (ref 4.22–5.81)
RDW: 14.1 % (ref 11.5–15.5)
RDW: 14.3 % (ref 11.5–15.5)
RDW: 14.5 % (ref 11.5–15.5)
WBC: 8.4 10*3/uL (ref 4.0–10.5)

## 2011-01-28 LAB — BLOOD GAS, ARTERIAL
Acid-base deficit: 0.6 mmol/L (ref 0.0–2.0)
Acid-base deficit: 3.2 mmol/L — ABNORMAL HIGH (ref 0.0–2.0)
Drawn by: 206361
Drawn by: 31149
FIO2: 0.21 %
FIO2: 0.32 %
Inspiratory PAP: 10
O2 Saturation: 98.3 %
PEEP: 5 cmH2O
Patient temperature: 97.4
TCO2: 25.3 mmol/L (ref 0–100)
pCO2 arterial: 37.1 mmHg (ref 35.0–45.0)
pCO2 arterial: 49.3 mmHg — ABNORMAL HIGH (ref 35.0–45.0)
pH, Arterial: 7.298 — ABNORMAL LOW (ref 7.350–7.450)
pH, Arterial: 7.375 (ref 7.350–7.450)
pO2, Arterial: 114 mmHg — ABNORMAL HIGH (ref 80.0–100.0)

## 2011-01-28 LAB — PROTIME-INR: Prothrombin Time: 13 seconds (ref 11.6–15.2)

## 2011-01-28 LAB — COMPREHENSIVE METABOLIC PANEL
ALT: 26 U/L (ref 0–53)
Alkaline Phosphatase: 50 U/L (ref 39–117)
BUN: 12 mg/dL (ref 6–23)
CO2: 25 mEq/L (ref 19–32)
Chloride: 104 mEq/L (ref 96–112)
Creatinine, Ser: 0.76 mg/dL (ref 0.4–1.5)
GFR calc non Af Amer: >60 mL/min (ref >60)
Glucose, Bld: 101 mg/dL — ABNORMAL HIGH (ref 70–99)
Glucose, Bld: 92 mg/dL (ref 70–99)
Potassium: 5.3 mEq/L — ABNORMAL HIGH (ref 3.5–5.1)
Sodium: 136 mEq/L (ref 135–145)
Total Bilirubin: 0.7 mg/dL (ref 0.3–1.2)
Total Protein: 6.3 g/dL (ref 6.0–8.3)

## 2011-01-28 LAB — BASIC METABOLIC PANEL
CO2: 26 mEq/L (ref 19–32)
Calcium: 8.4 mg/dL (ref 8.4–10.5)
Chloride: 107 mEq/L (ref 96–112)
Glucose, Bld: 114 mg/dL — ABNORMAL HIGH (ref 70–99)
Sodium: 138 mEq/L (ref 135–145)

## 2011-01-28 LAB — TYPE AND SCREEN: Antibody Screen: NEGATIVE

## 2011-01-28 LAB — URINALYSIS, ROUTINE W REFLEX MICROSCOPIC
Leukocytes, UA: NEGATIVE
Nitrite: NEGATIVE
Specific Gravity, Urine: 1.025 (ref 1.005–1.030)
Urobilinogen, UA: 1 mg/dL (ref 0.0–1.0)

## 2011-03-03 NOTE — Assessment & Plan Note (Signed)
OFFICE VISIT   Sean Gardner, Sean Gardner  DOB:  1956-09-29                                        March 26, 2009  CHART #:  [id number]   HISTORY OF PRESENT ILLNESS:  The patient returned today for evaluation  of new left-sided chest wall pain.  He had undergone left thoracotomy  and left lower lobectomy by Dr. Cornelius Moras on January 29, 2009.  He was last  seen on Mar 04, 2009.  He has started postoperative chemotherapy.  He  said that yesterday he sniffed hard and then developed left-sided chest  wall pain just around the area where his chest tube incisions are.  This  was particularly severe over night last night and not relieved by the  Ultram that he was taking.  He has not been doing any heavy lifting.  He  shortness of breath is stable.  He did report some dizziness when he  stood up this morning.   PHYSICAL EXAMINATION:  Today, his blood pressure is 109/72 and his pulse  is 109 and regular.  Respiratory rate is 18, unlabored.  Oxygen  saturation on room air is 97%.  He looks tired.  Cardiac exam shows a  regular rate and rhythm with normal heart sounds.  His lung exam is  clear.  The left thoracotomy incision is healing well.  The chest tube  sites are healing well.  There is some tenderness around the chest tube  sites but no signs of infection or swelling.   DIAGNOSTIC TEST:  A followup chest x-ray today shows stable pleural and  parenchymal opacity at the left lung base.  There are no new  abnormalities.   MEDICATIONS:  1. Lisinopril 20 mg daily.  2. Lopressor 50 mg b.i.d.  3. Crestor 20 mg at bedtime.  4. Aspirin 81 mg daily.  5. Ultram 50 mg p.r.n. for pain.   IMPRESSION:  I suspect his left-sided chest wall pain is probably  musculoskeletal.  I do not see any abnormality on his chest x-ray to  account for it, and it seems that there is local chest wall tenderness  in the area where he is complaining of pain.  A wrote him a prescription  for oxycodone IR 5  mg to 10 mg q.4 h p.r.n. for pain #40.  Also, I asked  him to hold his lisinopril as soon as his blood pressure is only 109/72  and may be dropping significantly lower when he takes is medications in  the morning.  He and his family attribute most of his problems to the  chemotherapy since he has been feeling worse since starting that.  He  will continue to follow up with Medical Oncology.  He will return to our  office if he develops any further problems with his incisions.   Evelene Croon, M.D.  Electronically Signed   BB/MEDQ  D:  03/26/2009  T:  03/27/2009  Job:  [id number]

## 2011-03-03 NOTE — Letter (Signed)
February 13, 2009   Cristy Hilts. Jacinto Halim, MD  [address]  [place], [place] [address]   Re:  ZYGMUND, PASSERO                DOB:  November 22, 1955   Dear Dr. Jacinto Halim:   I saw the patient back today after his followup for his lobectomy.  Dr.  Cornelius Moras did a left lower lobectomy and node dissection on him.  He is doing  well overall.  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  Given this  finding, and he probably will need to have adjuvant chemotherapy and I  explained this to him today.  His incisions are well healed.  His blood  pressure is 118/70, pulse 60, respiration 18, saturations were 97%.  I  told him he will gradually increase his activity, and I gave him a  prescription for Ultram.  Dr. Cornelius Moras will see him back again in 3 weeks  with a chest x-ray.   Sincerely,   Ines Bloomer, M.D.  Electronically Signed   DPB/MEDQ  D:  02/13/2009  T:  02/14/2009  Job:  [id number]   cc:   Lajuana Matte, MD

## 2011-03-03 NOTE — Letter (Signed)
January 02, 2009   Cristy Hilts. Jacinto Halim, MD  [address]  [place], [place] [address]   Re:  ISHAAQ, PENNA                DOB:  12/08/1955   Dear Dr. Jacinto Halim:   I appreciate the opportunity of seeing the patient.  This patient had an  emergency coronary artery bypass by Dr. Cornelius Moras in 2002.  Apparently, he  has done well.  Since then, he had acute onset of left chest pain, which  he went to the emergency room and a CT scan was done.  His previous  surgery included left internal mammary artery to the left anterior  descending, a right internal mammary to the right coronary artery.  A  radial artery to the second circumflex branch and a saphenous vein graft  to the second diagonal.  A CT scan at the emergency room revealed that  he had a 2 cm.  A lesion in the superior segment in the left lower lobe  was 2.2 x 1.3 cm.  A PET scan was ordered, which showed an uptake of 2.8  in the left lower lobe nodule.  No evidence of the uptake in the  mediastinal nodes or elsewhere.  His brain scan was also negative.  Pulmonary function tests were excellent with an FEV-1 of 3.37 with an  FVC of 4.18 and effusion capacity corrected of 91%.  He has a nonsmoker  at present time.  He has been treated in the past for questionable sleep  apnea.  Because of this, we feel that he probably has a low-grade cancer  in the superior segment of left lower lobe probably a carcinoid cancer.  Whatever it is that needs to be removed with at least a left lower lobe  superior segmentectomy.  Because of the surgery, we evaluated from a  cardiac status to give him cardiac clearance.  So, we will refer him  back to you before scheduling the surgery.  Dr. Cornelius Moras will do his surgery  since he did his heart surgery.  I appreciate the opportunity of seeing  the patient.   Sincerely.   Ines Bloomer, M.D.  Electronically Signed   DPB/MEDQ  D:  01/02/2009  T:  01/02/2009  Job:  [id number]

## 2011-03-03 NOTE — Discharge Summary (Signed)
Sean Gardner, Sean Gardner                ACCOUNT NO.:  [id number]   MEDICAL RECORD NO.:  [id number]          PATIENT TYPE:  INP   LOCATION:  3312                         FACILITY:  [facility]   PHYSICIAN:  Salvatore Decent. Cornelius Moras, M.D. DATE OF BIRTH:  04-30-1956   DATE OF ADMISSION:  01/29/2009  DATE OF DISCHARGE:  02/04/2009                               DISCHARGE SUMMARY   ADMITTING DIAGNOSES:  1. Left lung mass.  2. History of coronary artery disease (status post coronary artery      bypass grafting x4 in 2001).  3. History of non-ST-segment elevation myocardial infarction.  4. History of tobacco abuse.  5. History of hypertension.  6. History of hyperlipidemia.  7. History of obesity.   DISCHARGE DIAGNOSES:  1. Neuroendocrine tumor of the left lower lobe.  2. 2.  History of coronary artery disease (status post coronary artery      bypass grafting x4 in 2001).  3. History of non-ST-segment elevation myocardial infarction.  4. History of tobacco abuse.  5. History of hypertension.  6. History of hyperlipidemia.  7. History of obesity.   PROCEDURE:  Left video-assisted thoracic surgery, left thoracotomy, left  lower lobectomy with lymph node dissection by Dr. Cornelius Moras on January 29, 2009.   PATHOLOGY:  Left lower lobe of the lung revealed a neuroendocrine tumor  (1.8 cm), lymph node 11L showed metastatic neuroendocrine tumor in 1/1  lymph node.  There was no evidence of malignancy in 4/4 hilar lymph  nodes.   HISTORY OF PRESENT ILLNESS:  This is a 55 year old Caucasian male with a  past medical history of coronary artery disease (status post MI, status  post CABG x4 in 2001), history of tobacco abuse, and those previously  stated above, who has not had any further cardiac problems since 2001.  He developed a persistent productive cough approximately 3 weeks prior  to be being seen by Dr. Cornelius Moras in the office on January 28, 2009.  The cough  worsened over several days and became associated with  left-sided  atypical chest pain associated with severe coughing spells.  The patient  described the sputum as thick and greenish.  He presented to [facility] Emergency Room.  A CAT scan of the chest was done.  There was no  evidence of pulmonary embolism.  However, the patient was found to have  a 2.2-cm lobular nodule density in the left lower lobe, worrisome for  primary bronchogenic neoplasm.  There was no evidence of significant  mediastinal or hilar lymphadenopathy at that time.  Pulmonary function  tests revealed FEV-1 of 3.37 L and forced vital capacity of 4.18 L,  diffusion capacity 91% of predicted.   The patient was originally seen by Dr. Edwyna Shell in the office; however, as  previously stated, the patient was a prior patient of Dr. Orvan July.  The  patient was then seen by Dr. Jacinto Halim in March 2010, for cardiac clearance  prior to undergoing surgical intervention.  A stress Myoview exam  apparently was felt to be low risk for myocardial ischemia.  He was  cleared from a  cardiac standpoint.  It should also be noted the patient  did see his primary care physician in the interim and he was given a  prescription for oral antibiotics.  His productive cough resolved  promptly and he presented to [facility] on January 29, 2009, to undergo  the left thoracotomy and left lower lobectomy with lymph node dissection  by Dr. Cornelius Moras on January 29, 2009.   BRIEF HOSPITAL COURSE STAY:  Initially, the patient was not found to  have an air leak from his chest tube.  He had very minimal chest tube  output.  He had good pain control with the On-Q as well as the PCA.  He  was found to have a small air leak upon coughing on postop day #2.  His  chest tube was placed to water seal on this date.  Followup chest x-ray  revealed a suggestion of a small left pneumothorax.  Chest tubes had a  fair amount of output on this date, they did remain.  Subsequent chest x-  ray again revealed a small, stable left  pneumothorax.  There was no air  leak.  One chest tube was removed.  The On-Q was discontinued.  The  remaining chest tube was switched to a Mini-Express 500.  He had a  questionable 1 air leak on February 02, 2009.  Chest tube did not have any  air leak.  Chest x-ray was essentially unchanged as previously noted  with a small left apical pneumothorax.  Remaining chest tube was  removed.  The patient continued to improve such that by currently on  postop day #6 which is February 04, 2009, he has already been ambulating  without difficulties tolerating a diet.  He is afebrile.  Vital signs  are stable.  O2 sat was 96% on room air and he is going to be discharged  today.   LABORATORY STUDIES:  CMP done on February 03, 2009, potassium 4.4, BUN and  creatinine 12 and 0.76 respectively.  CBC done on this date, H&H 13.1  and 37.7, white count of 8400, and platelet count of 231,000.  Last  chest x-ray done this morning showed a stable, possibly slightly smaller  left apical pneumothorax.   DISCHARGE INSTRUCTIONS:  The patient is to remain on a low-sodium, heart-  healthy diet.  He is to increase his activity slowly.  May walk up  steps.  He is not to lift or drive for 3 weeks.  He may shower.  He is  to use soap and water on his wounds.  The patient is instructed to  continue with his breathing exercise daily, to walk every day, and  increase his frequency and duration as he tolerates.   FOLLOWUP APPOINTMENT:  To see Dr. Orvan July physician assistant on Feb 18, 2009, at 1:45 p.m.  Prior to this office appointment, a chest x-ray will  be obtained.   DISCHARGE MEDICATIONS:  1. Crestor 20 mg at bedtime.  2. Enteric-coated aspirin 81 mg p.o. daily.  3. Metoprolol 50 mg p.o. 2 times day.  4. Lisinopril 20 mg p.o. daily.  5. Ultram 50 mg 1-2 tablets every 6 hours as needed for pain.      Doree Fudge, PA      [place] [facility]. Cornelius Moras, M.D.  Electronically Signed    DZ/MEDQ  D:  02/04/2009  T:   02/05/2009  Job:  [id number]   cc:   Cristy Hilts. Jacinto Halim, MD

## 2011-03-03 NOTE — Assessment & Plan Note (Signed)
OFFICE VISIT   ASAH, LAMAY  DOB:  17-Nov-1955                                        Mar 04, 2009  CHART #:  [id number]   The patient returns to the office today for further followup, status  post left lower lobectomy on January 29, 2009, for what turned out to be  neuroendocrine carcinoma metastatic to one intralobar lymph node.  He  was last seen here in the office by Dr. Edwyna Shell on February 13, 2009.  Since  then he has continued to improve.  He has also been seen in followup by  Dr. Arbutus Ped, and Mr. Lad tentatively plans to start adjuvant  chemotherapy sometime in early June.  The patient reports that overall  he is doing well, although he still complains of exertional shortness of  breath.  He states that this is no worse than it was at the time of  hospital discharge, but he reports that it has not really improved a  whole lot.  He has not had any fevers, chills, or productive cough.  He  has very little residual pain in his chest wall, and this is very  positional in nature.  He is no longer taking any pain medicine.  His  appetite is good.  He has no other complaints.  The remainder of his  review of systems is unremarkable.   PHYSICAL EXAMINATION:  GENERAL:  A well-appearing male.  VITAL SIGNS:  Blood pressure 160/98, pulse 94, and oxygen saturation 98%  on room air.  CHEST:  A posterolateral thoracotomy incision that is healing very  nicely.  LUNGS:  Auscultation of the chest reveals clear breath sounds  bilaterally.  No wheezes or rhonchi are noted.  ABDOMEN:  Soft and nontender.  EXTREMITIES:  Warm and well perfused.   DIAGNOSTIC TEST:  Chest x-ray performed today at the [facility] is reviewed.  This demonstrates stable radiographic appearance  following left lower lobectomy.  The remainder of the left lung is clear  and well expanded.  The right lung is clear.  No other abnormalities are  noted.   IMPRESSION:  Overall, the  patient appears to be doing well.  His chest x-  ray looks good.  He still has some exertional shortness of breath that  has been a bit slow to improve following surgery, although he is only 1  month following surgery at this point in time.  He otherwise seems to be  doing quite well.  His blood pressure is running a little bit high here  in the office today.   PLAN:  I have encouraged the patient to continue to gradually increase  his physical activity as tolerated with his only limitation at this  point remaining that he refrain from heavy lifting or strenuous use of  his arms or shoulders.  I have cautioned him to keep an eye on his blood  pressure, and he will need to see Dr. Jacinto Halim or one of his partners for  adjustment of his medications should his blood pressure continue to run  on the high side.  All of his questions have been addressed.  We will  plan to see him back in 2 months with a followup chest x-ray.  If he is  in the middle of chemotherapy at that time, his followup appointment  can  be postponed until he completes his course of adjuvant therapy.  All of  his questions have been addressed.   Salvatore Decent. Cornelius Moras, M.D.  Electronically Signed   CHO/MEDQ  D:  03/04/2009  T:  03/05/2009  Job:  [id number]   cc:   Lajuana Matte, MD  Cristy Hilts. Jacinto Halim, MD  [facility]

## 2011-03-03 NOTE — Op Note (Signed)
Sean Gardner, Sean Gardner                ACCOUNT NO.:  [id number]   MEDICAL RECORD NO.:  [id number]          PATIENT TYPE:  INP   LOCATION:  3312                         FACILITY:  [facility]   PHYSICIAN:  Salvatore Decent. Cornelius Moras, M.D. DATE OF BIRTH:  Sep 14, 1956   DATE OF PROCEDURE:  01/29/2009  DATE OF DISCHARGE:                               OPERATIVE REPORT   PREOPERATIVE DIAGNOSIS:  Left lung mass.   POSTOPERATIVE DIAGNOSIS:  Left lung mass.   PROCEDURE:  Left lower lobectomy.   SURGEON:  Salvatore Decent. Cornelius Moras, MD   ASSISTANT:  Doree Fudge, PA   SECOND ASSISTANT:  Stephanie Acre. Dominick, PA   ANESTHESIA:  General.   BRIEF CLINICAL NOTE:  The patient is a 55 year old male with  longstanding tobacco abuse, coronary artery disease, hypertension,  hyperlipidemia, obesity, and obstructive sleep apnea.  The patient  developed productive cough and atypical chest pain for which he was seen  in the emergency room in March 2010.  A chest CT scan was performed at  that time demonstrating a 2-cm mass in the superior segment of the left  lower lobe suspicious for primary lung cancer.  The patient subsequently  underwent PET/CT scan that revealed mild increase metabolic activity  within this lesion.  There was no other sign of other abnormal areas and  no sign of distant metastatic disease.  The patient was initially  evaluated by Dr. Edwyna Shell and subsequently referred for surgical  intervention.  Prior to surgery, the patient underwent preoperative  cardiac clearance by Dr. Yates Decamp.  The patient now presents for  surgery as described.  The patient's family have been counseled at  length regarding the indications, risks, and potential benefits of  surgery.  Alternative treatment strategies have been discussed.  They  understand and accept all potential associated risks and desire to  proceed with surgery as described.   OPERATIVE NOTE IN DETAIL:  The patient was brought to the operating room  on the  above-mentioned date and central monitoring was established by  the anesthesia team under the care and direction of Dr. Sheldon Silvan.  Specifically, a central venous catheter was placed for central access.  A right radial arterial line was placed.  Intravenous antibiotics were  administered.  The patient was placed in the supine position on the  operating table.  General endotracheal anesthesia was induced under the  care and direction of Dr. Ivin Booty.  Initially, significant difficulty was  experienced with respect to positioning of a dual-lumen endotracheal  tube.  The patient was easy to intubate, but positioning of the tube in  appropriate position and subsequent verification of its position with  bronchoscopy took considerable period of time with consultation from a  variety of Dr. Ivin Booty' colleagues from the anesthesia team.  Ultimately,  a dual-lumen endotracheal tube was secured in the appropriate position.  A Foley catheter was placed.  Pneumatic sequential compression boots  were placed on both lower extremities.  The patient was turned to the  right lateral decubitus position using a pneumatic beanbag device to  facilitate positioning.  The patient's left  chest was prepared and  draped in sterile manner.  Single lung ventilation was begun.   A small incision was made overlying the seventh intercostal space in the  anterior axillary line.  The incision was completed through the  subcutaneous tissues with electrocautery.  The left pleural space was  entered bluntly.  A 10-mm port was passed through the incision.  A 30-  degree camera was passed through the port and the left chest was  explored visually.  There were some wispy adhesions between the visceral  and parietal surface of the pleura.  Due to the patient's obesity and  significant body habitus and the presence of some adhesions from  previous surgery, endoscopic pulmonary resection was felt not to be  technically safe or  feasible.   The left lateral thoracotomy incision was performed.  The latissimus  dorsi muscle fibers were split longitudinally and preserved.  The  serratus anterior muscle was retracted and preserved.  The left pleural  space was entered through the fifth intercostal space.  The ribs were  preserved and not divided.  The adhesions between the visceral and  parietal surface of the pleura were divided.  This was technically  straightforward.  There were some adhesions in the base of the major  fissure.  These were divided with electrocautery.  The superior segment  of the left lower lobe was now palpated.  A firm rubbery mass deep in  the parenchyma of the superior segment can be appreciated, corresponding  to the abnormality noted on CT scan and PET scan.  The inferior  pulmonary ligament was now divided.  The reflection of the pleura was  divided.  The floor of the major fissure was now opened.  The segmental  branch to the superior segment of the left lower lobe from the pulmonary  artery was identified from associated structures, subsequently encircled  with vessel loop and then divided with ATW stapler.  At this juncture,  an echelon endoscopic stapler was utilized to perform staple  segmentectomy of the superior segment of the left lower lobe.  Of note,  the mass in question was fairly low in the superior segment and really  close to the superior portion of the posterior basilar segment of the  left lower lobe.  The segmentectomy in reality comprises a portion of  the posterior basilar segment of the left lower lobe as well in order to  encompass the mass in question.  Ultimately, the entire superior segment  was removed and sent to pathology.   Preliminary frozen section histology on the mass in question was termed  a neoplasm.  The pathologist reported that it may be a low grade  malignancy, but this was not definitive.  The pathologist comments that  it may be of epithelial  origin, but this was not definitive.  The  pathologist reports that the stapled bronchial margin was free of tumor.  When about whether or not the staple line margins of the lung parenchyma  were involved, the pathologist dates that it is close, but probably not  involved with perhaps a 5-10 mm margin.  Under the circumstances,  completion lobectomy was felt to be necessary to ensure adequate  surgical resection of the neoplasm in question, particularly given the  uncertain report based upon frozen section.   The left inferior pulmonary vein was encircled with vessel loop and  subsequently divided with ATW stapler.  The continuation of the  pulmonary artery below the segmental branch was  now divided with ATW  stapler.  The remainder of the major fissure was divided with ATW  stapler and subsequently, the remainder of the left lower lobe was  removed using echelon stapler with a gold staples prior to divide the  bronchus.   One interlobar lymph node was removed in level 11L lymph node.  One  lymph node from the inferior pulmonary ligament was removed in level 9  lymph node.   The left chest was filled with warm saline.  The left upper lobe was  ventilated.  There was no sign of any residual air leak.  Meticulous  hemostasis was ascertained.   The On-Q continuous pain management system was utilized to facilitate  postoperative pain control.  One 5-inch catheter was supplied with the  On-Q kit, it was tunneled initially through the subcutaneous tissue  through a small stab incision posteriorly and then tunneled into the  subpleural space to cover the second through the sixth intercostal space  nerve roots posteriorly.  The catheter was flushed with 5 mL of 0.5%  bupivacaine solution and ultimately connected to continuous infusion  pump.  The left pleural space was drained using two 28-French chest  tubes exited through separate stab incisions inferiorly.  The  thoracotomy incision was  closed in multiple layers in routine fashion.  The skin incision was closed with subcuticular skin closure.   The patient tolerated the procedure well, was extubated in the operating  room, and transported to recovery room in stable condition.  There were  no intraoperative complications.  All sponge, instrument, and needle  counts were verified correct at completion of the operation.  No blood  products were administered.  Estimated blood loss was 350 mL.      Salvatore Decent. Cornelius Moras, M.D.  Electronically Signed     CHO/MEDQ  D:  01/29/2009  T:  01/30/2009  Job:  [id number]   cc:   Cristy Hilts. Jacinto Halim, MD

## 2011-03-03 NOTE — H&P (Signed)
HISTORY AND PHYSICAL EXAMINATION   January 28, 2009   Re:  TASHI, BAND          DOB:  1956-01-11   DATE OF PLANNED HOSPITAL ADMISSION:  January 29, 2009.   PRESENTING CHIEF COMPLAINT:  Left lung mass.   HISTORY OF PRESENT ILLNESS:  The patient is a 55 year old obese white  male from [place] with history of coronary artery disease,  hypertension, hyperlipidemia, and longstanding tobacco abuse.  The  patient underwent coronary artery bypass grafting x4 in 2001 for severe  three-vessel coronary artery disease, status post acute non-ST segment  elevation myocardial infarction.  Postoperatively, the patient did well.  He initially tried to quit smoking, but he ultimately went back to  smoking again.  He has not had any further cardiac problems whatsoever  since his heart surgery in 2001.  The patient was in his usual state of  health until approximately 3 weeks ago when he developed persistent  productive cough.  The cough worsened over several days and became  associated with left-sided atypical chest pain associated with severe  coughing spells.  The patient states at that time his cough was  productive of thick greenish sputum.  He ultimately presented to the  [facility] Emergency Room because of this persistent cough and atypical  chest pain.  A chest CT scan was performed at that time to rule out  pulmonary embolus.  This revealed the presence of a small 2-cm mass in  the superior segment of the left lower lobe.  No other significant  abnormalities were noted.  The patient was referred to Dr. Edwyna Shell, who  saw him in consultation on January 02, 2009.  A PET CT scan was performed  demonstrating mild increased uptake in the 2-cm mass in the left lower  lobe.  No other abnormalities were noted.  The patient had pulmonary  function test performed with FEV-1 measured 3.37 liters and a forced  vital capacity of 4.18 liters.  Diffusion capacity was 91% predicted.  The  patient was seen by Dr. Edwyna Shell and initially plans were made to  proceed with surgery.  However, the patient was found to be an old  patient of mine and subsequently referred for second opinion.  With his  history of chest discomfort, he was then referred for outpatient cardiac  clearance by Dr. Jacinto Halim.  He was initially evaluated by Dr. Jacinto Halim on  January 04, 2009.  He was sent for a stress Myoview exam that reportedly  was felt to be low risk for myocardial ischemia.  The official report  from this test is not currently available.  The patient is felt to be  clear to proceed with surgery as described.  The patient reports that  subsequent to his visit with Dr. Edwyna Shell, he went to see his primary care  physician at [facility] and was given a prescription  for oral antibiotics.  His productive cough resolved promptly.  He has  not had any further cough or chest discomfort whatsoever since then.   REVIEW OF SYSTEMS:  General:  The patient reports normal appetite.  He  has actually been gaining a little weight recently.  Cardiac:  Notable  only for mild exertional shortness of breath that is chronic and stable.  The patient denies resting shortness of breath, PND, orthopnea, or lower  extremity edema.  The patient denies exertional chest discomfort  suspicious for angina.  Respiratory:  The patient continues to smoke.  The  patient's previous cough has resolved.  He denies any ongoing cough,  hemoptysis, or wheezing.  Gastrointestinal:  Negative.  The patient  reports no difficulty swallowing.  He denies hematochezia, hematemesis,  melena.  Musculoskeletal:  Negative.  The patient denies arthritis or  arthralgias.  Genitourinary:  Negative.  HEENT:  Negative.  Infectious:  Notable for likely upper respiratory tract infection that resolved.  The  patient denies recent fevers or chills over the last couple of weeks.   PAST MEDICAL HISTORY:  1. Coronary artery disease, status post  coronary artery bypass      grafting x4 in October 2001.  Grafts placed at the time of surgery      include left internal mammary artery to the distal left anterior      descending coronary artery, right internal mammary artery to the      distal right coronary artery, left radial artery to the second      circumflex marginal branch, and saphenous vein graft to the second      diagonal branch.  2. Hypertension.  3. Hyperlipidemia.  4. Longstanding tobacco abuse.  5. Obesity.   FAMILY HISTORY:  Notable for the absence of any family members with lung  cancer.   SOCIAL HISTORY:  The patient is married and lives in [place].  He  states that he is retired, but still works part time, doing some  carpentry work.  He has longstanding history of tobacco abuse, smoking 1-  2 packs of cigarettes per day for many years.  He quit smoking initially  after his heart surgery, but went back to smoking again.  He states that  recently he has cut back to a half-pack of cigarettes per day.  The  patient denies excessive alcohol consumption.   CURRENT MEDICATIONS:  1. Aspirin 81 mg daily.  2. Lisinopril 20 mg daily.  3. Metoprolol 50 mg twice daily.  4. Crestor 20 mg daily.   DRUG ALLERGIES:  None known.   PHYSICAL EXAMINATION:  GENERAL:  The patient is well-appearing,  moderately obese male, who appears his stated age in no acute distress.  VITAL SIGNS:  Blood pressure 143/81, pulse 73, and oxygen saturation 96%  on room air.  HEENT:  Unrevealing.  NECK:  There is no palpable lymphadenopathy.  LUNGS:  Auscultation of the chest demonstrates clear breath sounds which  are symmetrical bilaterally.  No wheezes or rhonchi are demonstrated.  CARDIOVASCULAR:  Regular rate and rhythm.  No murmurs, rubs, or gallops  are appreciated.  ABDOMEN:  Obese, soft, and nontender.  There are no palpable masses.  Bowel sounds are present.  EXTREMITIES:  Warm and well perfused.  There is no lower extremity   edema.  There is no sign of venous insufficiency.  RECTAL/GU:  Deferred.  NEUROLOGIC:  Grossly nonfocal and symmetrical throughout.   DIAGNOSTIC TESTS:  Chest CT scan and PET CT scan performed in March 2010  are both reviewed.  These demonstrate a 2 cm mass in the superior  segment of the left lower lobe which is very mildly hypermetabolic on  PET and suspicious for primary lung cancer.  There are no other  pulmonary parenchymal lesions noted.  There is no lymphadenopathy.  There are no pleural effusions.  There is increased metabolic activity  in the adenoid glands bilaterally, likely demonstrating benign uptake.  There is no sign of distant metastatic disease.  CT scan of the head  performed in March also reveals no evidence for intracranial metastases.  Pulmonary function test performed on December 31, 2008, is reviewed.  FEV-1  measured 3.37 liters or 84% predicted.  This increased slightly to 3.56  liters following bronchodilator therapy.  Forced vital capacity was 4.18  liters or 79% predicted.  FEF25-75 was 3.42 or 93% predicted and  diffusion capacity was 73% predicted.   IMPRESSION:  Small, well-circumscribed lesion in the superior segment of  the left lower lobe suspicious for early stage primary lung cancer.   PLAN:  We will tentatively plan to proceed with left VATS and possible  open thoracotomy for wedge resection or segmentectomy of this lesion and  possible formal lobectomy if necessary.  I have outlined the  indications, risks, and potential benefits of surgery with the patient  and his family.  Alternative treatment strategies have been discussed  including continued conservative management with serial radiographic  followup versus transthoracic needle aspiration biopsy versus proceeding  directly with surgery.  The relative risks and benefits of each of these  approaches have been discussed.  They understand and accept all  potential associated risks of surgery  including but not limited to risk  of death, stroke, myocardial infarction, pulmonary embolus, respiratory  failure, pneumonia, bleeding requiring blood transfusion, arrhythmia,  prolonged chest wall pain, prolonged air leak requiring chest tube  drainage, late recurrence of malignancy.  All of their questions have  been addressed.   Salvatore Decent. Cornelius Moras, M.D.  Electronically Signed   CHO/MEDQ  D:  01/28/2009  T:  01/29/2009  Job:  [id number]   cc:   Cristy Hilts. Jacinto Halim, MD  [facility]

## 2011-03-06 NOTE — Discharge Summary (Signed)
NAME:  HOYLE, BARKDULL                          ACCOUNT NO.:  [id number]   MEDICAL RECORD NO.:  [id number]                   PATIENT TYPE:  INP   LOCATION:  3737                                 FACILITY:  [facility]   PHYSICIAN:  Cristy Hilts. Jacinto Halim, M.D.                  DATE OF BIRTH:  [birth year]   DATE OF ADMISSION:  12/14/2003  DATE OF DISCHARGE:  12/18/2003                                 DISCHARGE SUMMARY   ADMISSION DIAGNOSES:  1. Status post cardiac catheterization on December 14, 2003.  2. Status post electrocardiogram changes and chest pain post cardiac     catheterization on December 14, 2003.  3. History of coronary artery disease with history of coronary artery bypass     grafting in 2001.  4. Peripheral vascular disease with slightly abnormal Doppler.  5. Tobacco use.  6. Obesity.  7. Hyperlipidemia.   DISCHARGE DIAGNOSES:  1. Status post cardiac catheterization on December 14, 2003.  2. Status post electrocardiogram changes and chest pain post cardiac     catheterization on December 14, 2003.  3. History of coronary artery disease with history of coronary artery bypass     grafting in 2001.  4. Peripheral vascular disease with slightly abnormal Doppler.  5. Tobacco use.  6. Obesity.  7. Hyperlipidemia.  8. Status post positive myocardial infarction status post diagnostic cardiac     catheterization.   HISTORY OF PRESENT ILLNESS:  Mr. Mahkai Fangman is a 55 year old white male with  a history of CAD status post CABG in 2001.  When he saw Dr. Jacinto Halim in the  office on November 28, 2003, he was having complaints of shortness of breath  with exertion that had increased over the last month or so.  He complained  of just feeling terrible and feeling like he had no energy.  He states he  comes in from work and goes to bed and feels like he cannot do anything  else.  EKG shows sinus rhythm with no significant changes.   In addition to chest pain, he is also having lower extremity  claudication  symptoms.  At that time was planned to go on to cardiac and then peripheral  vascular angiogram given his symptomatology.  Risks and benefits of the  procedure were discussed, and he was willing to proceed. Will recheck  appropriate pre-procedure labs and schedule him for cardiac catheterization  prior to peripheral vascular angiogram.   On December 14, 2003, Mr. Kostick underwent cardiac catheterization by Dr. Yates Decamp at the [facility].  I do not have the full dictated  catheterization report available at this time.  Apparently there were no  significant abnormalities found at the time of cardiac catheterization.  Dr.  Jacinto Halim completed the diagnostic cardiac catheterization.  Post  catheterization, the patient started to complain of left arm pain.  At that  time he was found  to have new EKG changes consistent with T wave inversion  and subtle ST depression in inferolateral leads.  Dr. Jacinto Halim then proceeded  with repeat coronary angiogram to evaluate for any complications.  All of  the grafts and native vessels were unchanged.  There was no subclavian  dissection.  He could find no abnormalities on repeat look.   By this point, the left arm pain had resolved.  However, because of the EKG  changes, Dr. Jacinto Halim felt he planned to have patient admitted to  [facility] observation.  Note that at that point, his heart rate was 68,  blood pressure 116/76, respirations 14.  Heart sounds were normal S1 and S2  with no gallop, no murmur, no rub.  Lungs were clear.  Extremities showed no  edema, no abnormality.  At that point, Dr. Jacinto Halim planned to admit, check  enzymes.   Dr. Jacinto Halim did see the patient once he got to [facility] ER at which point  the EKG and CT changes were markedly improved.   HOSPITAL COURSE:  On the morning of December 15, 2003, Mr. Corpening was feeling  okay; however, his cardiac enzymes were elevated.  The first set showed MB  2.5.  On the  second set, the MB was up to 29.7, and the third set was  actually peaking at 85.4, troponin 9.54.  Therefore at that time, because  enzymes were positive, IV heparin had been started, and he was on IV heparin  at the time of our evaluation that morning.  At that point, we planned to  add Integrilin.  We would check further enzymes to make sure that was the  peak and ensure they were trending downward.   On December 16, 2003, we got a full other set of enzymes, and they were all  decreased.  That third set was the peak with MB of 85.4, troponin 9.54 as  peak enzymes.  When we saw him on the morning of February 27, at this point  was on aspirin, Plavix, heparin, Integrilin, as well as Toprol, Altace, and  statin.  At this point, he was evaluated by Dr. Tresa Endo.  EKG again remained  entirely normal as it did on the previous day.  Planned to continue  Integrilin for the day and continue Plavix indefinitely.   On December 17, 2003, Mr. Guerrant was feeling well with no symptoms, no chest  pain, shortness of breath, arm pain.  Dr. Tresa Endo again spoke with patient and  his wife, and he felt that the patient had probably experience distal  embolization via the graft with etiology of increased enzymes.  At this  point, he remains pain free.  EKG shows no changes.  At this point, plan to  stop heparin, stop nitroglycerin, and Integrilin was already turned off the  previous day.  Did increase his ACE.  Planned for discharge home in the  morning if stable.   On December 18, 2003, Mr. Vester is doing well.  He continues to have no chest  pain, arm pain, shortness of breath.  At this point, labs are all stable.  He has been evaluated by Dr. Yates Decamp in followup at this point.  He notes  this patient remains asymptomatic since admission.  Plan to discharge at  this time with plans for him to stay out of work until December 31, 2003.  At this point, he is deemed stable for discharge home by Dr. Yates Decamp.    CONSULTATIONS:  None.   PROCEDURES:  None.   LABORATORY AND X-RAY DATA:  Cardiac enzymes 6 sets as follows.  CK 161, 353,  657, 593, 430, and 301.  CK-MB 2.5, 29.7, 85.4, 85.9, 41,8, and 20.7.  Troponin 0.02, 1.68, 9.53, 8.98, 7.01, 4.89.  Electrolytes remained stable  with sodium 135, potassium 4.1, chloride 104, CO2 24, glucose 91, BUN 10,  creatinine 0.9.  White count 6.3, hemoglobin 14.6, hematocrit 43.4,  platelets 262,000.  All these parameters remained stable with no significant  variation throughout the admission.   EKG performed at [facility] apparently had shown inferolateral  T wave versions/ST depression.  However, EKGs while in the hospital here  actually show sinus rhythm with nonspecific ST-T change throughout the  admission.   DISCHARGE MEDICATIONS:  1. Lipitor 40 mg once a day.  2. Aspirin 81 mg once a day.  3. Plavix 75 mg once a day.  4. Pletal 100 mg twice a day.  5. Zetia 10 mg a day.  6. Toprol XL 50 mg once a day.  7. Imdur 30 mg once a day.  8. Altace 5 mg twice a day.   DIET:  Low-fat, low-salt.   ACTIVITY:  Light.   FOLLOW UP:  The office is to call with an appointment to see Dr. Jacinto Halim in  the next couple of weeks as well as follow up with Dr. Daphine Deutscher.      Mary B. Remer Macho, P.A.-C.                   Cristy Hilts. Jacinto Halim, M.D.    MBE/MEDQ  D:  01/21/2004  T:  01/22/2004  Job:  [id number]

## 2011-03-06 NOTE — Cardiovascular Report (Signed)
NAME:  Sean Gardner, Sean Gardner                          ACCOUNT NO.:  [id number]   MEDICAL RECORD NO.:  [id number]                   PATIENT TYPE:  OIB   LOCATION:  2899                                 FACILITY:  [facility]   PHYSICIAN:  Cristy Hilts. Jacinto Halim, M.D.                  DATE OF BIRTH:  05-29-1956   DATE OF PROCEDURE:  01/24/2004  DATE OF DISCHARGE:  01/24/2004                              CARDIAC CATHETERIZATION   REFERRING PHYSICIAN:  Tanya D. Daphine Deutscher, M.D.   PROCEDURES PERFORMED:  1. Peripheral angiography.  2. Peripheral angioplasty and stenting of the left superficial femoral     artery.   CARDIOLOGIST:  Cristy Hilts. Jacinto Halim, M.D.   INDICATIONS:  Mr. Sean Gardner is a 56 year old Caucasian male with a  history of coronary disease, status post CABG, and hypertension and  hyperlipidemia who has been having severe lifestyle limiting claudication.  He had undergone diagnostic angiography previously on December 14, 2003 at  [facility], and was found to have a focal 90% stenosis of the  left SFA.  He was brought here for elective PTA of the left SFA.   DATA:  The left iliac artery, left femoral artery and left superficial  femoral artery have mild disease.  There was a focal 90% stenosis of the  left SFA in its distal segment.   DESCRIPTION OF INTERVENTIONAL DATA:  Successful PTA using a 6.0 x 20 mm  PowerFlex balloon performed at a maximum of 12 atmospheric pressure for 60  seconds.  After multiple inflations the stenosis reduced from 90% to less  than 10% with excellent triple vessel distal runoff.   RECOMMENDATIONS:  The patient will be continued on aspirin and Plavix.  Continued risk factor modification is indicated.  The patient has very mild  disease in his right SFA and medical therapy is indicated for the same.   TECHNIQUE OF THE PROCEDURE:  Under the usual sterile precautions using a 5  French right femoral artery access a 5 French LIMA catheter was utilized to  crossover  from the right femoral and into the left iliac artery.  With the  help of a Wholey wire and then with the help of a 0.035 inch Glidewire the  left iliac artery was selectively cannulated.  Then the wire was exchanged  for a Wholey wire for support.  Then a 7 French Terumo sheath was utilized  to cross from the right femoral artery into the left iliac artery.  Then a  6.0 x 20 mm PowerFlex balloon was utilized to perform multiple inflations to  the left distal SFA.  A total of five dilatations from eight to 12  atmospheric pressures for 60-90 seconds were performed.  Post balloon  dilatation angiography revealed excellent results.   The wire and the catheters were withdrawn, and angiography was repeated to  confirm the success.  Then the Terumo sheath was gently pulled  back into the  right femoral artery and sutured in place.   During the procedure a total of 8,000 units of IV heparin was administered  and the ACT was maintained at greater than 220.   The patient tolerated the procedure well.   COMPLICATIONS:  No complications were noted.                                               Cristy Hilts. Jacinto Halim, M.D.    Pilar Plate  D:  01/24/2004  T:  01/25/2004  Job:  [id number]   cc:   Tanya D. Daphine Deutscher, M.D.  [address]  [place]  [place] [address]  Fax: [phone]

## 2011-03-06 NOTE — Discharge Summary (Signed)
NAME:  DYLLON, HENKEN                          ACCOUNT NO.:  [id number]   MEDICAL RECORD NO.:  [id number]                   PATIENT TYPE:  INP   LOCATION:  3737                                 FACILITY:  [facility]   PHYSICIAN:  Cristy Hilts. Jacinto Halim, M.D.                  DATE OF BIRTH:  November 05, 1955   DATE OF ADMISSION:  12/14/2003  DATE OF DISCHARGE:  12/18/2003                                 DISCHARGE SUMMARY   ADMISSION DIAGNOSES:  1. Status post diagnostic cardiac catheterization on December 14, 2003.  2. Status post EKG changes and chest pain, post cardiac catheterization.  3. History of coronary artery disease with history of coronary artery bypass     graft.  4. History of peripheral vascular disease.   DICTATION ENDED AT THIS POINT.      Mary B. Remer Macho, P.A.-C.                   Cristy Hilts. Jacinto Halim, M.D.    MBE/MEDQ  D:  01/21/2004  T:  01/22/2004  Job:  [id number]   cc:   Cristy Hilts. Jacinto Halim, M.D.  [address]  [place]  [place] [address]  Fax: [phone]

## 2011-03-06 NOTE — Discharge Summary (Signed)
[facility]  Patient:    Sean, Gardner                       MRN: [id number] Adm. Date:  [id number] Disc. Date: [id number] Attending:  Tressie Stalker Dictator:   Carlye Grippe. CC:         Gerrit Friends. Dietrich Pates, M.D. [facility]                           Discharge Summary  DATE OF BIRTH:  Feb 07, 1956  HISTORY OF PRESENT ILLNESS:  This is a 55 year old gentleman with moderate to severe left chest, shoulder, and upper arm aching approximately three hours duration at the time of presentation to the emergency room.  The pain was constant without dyspnea, nausea, or diaphoresis.  The patient had no change with exercise or body position.  He had no response to nitroglycerin in the ambulance.  He had no similar prior symptoms.  He had no cardiac history.  He was felt to require admission for further evaluation and treatment including rule out myocardial infarction.  PAST MEDICAL HISTORY:  Includes right and left hernia repair, no other hospital admissions or surgery.  MEDICATIONS ON ADMISSION:  None, although he did have a recent course of antibiotics for otitis.  SOCIAL HISTORY, FAMILY HISTORY, REVIEW OF SYSTEMS, AND PHYSICAL EXAMINATION: Please see the History and Physical done at the time of admission.  ASSESSMENT AND PLAN:  The patient was admitted.  There was inferior T wave inversion and ST depression and slight ST elevation in lead aVL.  Impression was unstable angina.  Initial treatments included intravenous nitroglycerin, aspirin, low molecular weight heparin, metoprolol, and evaluation of enzymes scheduled for cardiac catheterization.  HOSPITAL COURSE:  The patients initial CPK and MB enzymes were positive, and he ruled in for a non-Q-wave myocardial infarction that was considered in the high lateral distribution.  Catheterization was scheduled and performed on August 16, 2000, by Dr. Riley Kill, and this revealed significant left  main coronary artery disease with additional lesions in the LAD and circumflex including an 80% diagonal lesion.  Ejection fraction was 48.4% with evidence of anteroapical hypokinesis.  The right coronary artery did have a 50% ostial lesion, but no significant distal lesions.  Due to these findings and recent myocardial infarction, cardiac surgical opinion was obtained with Tressie Stalker, M.D. who evaluated the patient and his studies and agreed with recommendations for coronary artery surgical revascularization.  The patient was stabilized from a medical viewpoint, and the procedure was scheduled.  Procedure:  On August 18, 2000, the patient was taken to the operating room where he underwent the following procedure:  Coronary artery bypass grafting x 4.  The following grafts were placed:  (1) Left internal mammary artery to the distal left anterior descending coronary artery.  (2) Right internal mammary artery to the distal right coronary artery.  (3) Left radial artery to the second circumflex marginal branch.  (4) Saphenous vein graft to the second diagonal branch.  The patient tolerated procedure well.  Crossclamp time was 106 minutes.  Pump time was 143 minutes.  The patient required no blood products and came off cardiopulmonary bypass on no inotropic support and was taken to the surgical intensive care unit in stable condition.  Postoperative hospital course:  The patient has remained hemodynamically stable.  He initially required some aggressive bronchial toilet and optimization of his pulmonary  status prior to extubation, but he was extubated on the evening of surgery without significant difficulties.  He subsequently has developed a tracheobronchitis and has been placed on oral Tequin antibiotic, but overall pulmonary status is good, and he maintains good saturations on room air.  His routine lines, monitors, and tubes have all been discontinued in a stepwise manner.  He has  tolerated a routine advancement on the 2000 telemetry unit in regards to cardiac rehabilitation phase 1 modalities.  He maintains a normal sinus rhythm.  He does have a postoperative anemia, but this is stable.  Most recent hemoglobin and hematocrit measured August 20, 2000, are 8.7 and 24.8.  His incisions are all healing well. His left radial harvest site reveals good neurovascular function.  His overall status is felt to be quite stable for tentative discharge in the morning of August 22, 2000, pending morning round reevaluation.  CONDITION ON DISCHARGE:  Stable and improving.  MEDICATIONS ON DISCHARGE:  1. Enteric-coated aspirin one q.d.  2. Percocet one to two q.4-6h. p.r.n.  3. Lopressor 25 mg q.12h.  4. Imdur 30 mg q.a.m. x 2 months for radial artery graft.  5. Lasix 40 mg q.a.m. x 3 days.  6. Potassium chloride 20 mEq q.a.m. x 3 days.  7. Tequin 400 mg q.d. x 3 days.  8. Iron sulfate 325 mg b.i.d. x 3 days.  9. Colace 100 mg b.i.d. x 2 weeks. 10. Lipitor 10 mg q.p.m.  DISCHARGE INSTRUCTIONS:  The patient will receive written instructions regarding medications, activity, diet, wound care, and follow-up.  FOLLOWUP:  Follow-up will include Dr. Dietrich Pates in [place], September 07, 2000.  Dr. Cornelius Moras will see the patient in three weeks.  FINAL DIAGNOSES: 1. Severe coronary artery disease with left main disease. 2. Recent non-Q-wave myocardial infarction.  OTHER DIAGNOSES: 1. Postoperative anemia. 2. Postoperative bronchitis. 3. History of recent otitis media infection. 4. History of feet injuries while in the [employer]. 5. History of ruptured Achilles tendon. DD:  08/21/00 TD:  08/23/00 Job: [id number]

## 2011-03-06 NOTE — Cardiovascular Report (Signed)
[place]  Patient:    Sean Gardner, Sean Gardner                       MRN: [id number] Proc. Date: 08/16/00 Adm. Date:  [id number] Attending:  Nelta Numbers CC:         Gerrit Friends. Dietrich Pates, M.D. [facility]             CV Laboratory                        Cardiac Catheterization  INDICATIONS:  The patient is a 55 year old white male from [place] who presented with a non-Q-wave infarction.  He had some lateral ST elevation that resolved.  He also had some mild precipitable changes.  The current study was done to access anatomy.  PROCEDURES: 1. Left heart catheterization. 2. Selective coronary arteriography. 3. Selective left ventriculography. 4. Subclavian angiography.  HEMODYNAMIC DATA:  The central aortic pressure is 124/87.  LV pressure 128/26. There is no gradient on pullback across the aortic valve.  ANGIOGRAPHIC DATA:  VENTRICULOGRAPHY:  Ventriculography in the RAO projection reveals preserved overall systolic function.  There was mid anterolateral hypo to akinesis. Ejection fraction was calculated at 48%.  There did not appear to be significant mitral regurgitation in the LAO projection.  There was no segmental wall motion abnormality.  The left main coronary demonstrates some ostial stenosis that is eccentric and shelf-like.  This is best demonstrated on the very final shots using a guiding catheter outside the left main artery.  It is reproduced in several views.  The left anterior descending artery courses through the apex.  There is about 40% narrowing beyond the origin of the diagonal branch.  The moderate sized second diagonal branch has an 80% stenosis prior to a bifurcation point.  This is likely the site of the infarct related artery.  The circumflex provides a small first marginal and then there is a 40% stenosis followed by an 80% stenosis leading into a large second marginal. The distal marginal is suitable for  grafting.  The right coronary artery demonstrates about a 50% ostial stenosis followed by a 20% mid stenosis.  There is segmental plaquing of 40-50% in the mid PDA.  The subclavian is widely patent.  CONCLUSIONS: 1. Mild reduction in global left ventricular function with a wall motion    abnormality involving the anterolateral apical segment and ejection    fraction of 48%. 2. Infarct related artery consistent with diagonal stenosis. 3. Incidental left main disease. 4. High-grade stenosis of the mid circumflex.  DISPOSITION:  We will plan to get a surgical consult.  The patient will likely need revascularization surgery. DD:  08/16/00 TD:  08/16/00 Job: [id number]

## 2011-03-06 NOTE — Op Note (Signed)
[name]  Patient:    Sean Gardner, Sean Gardner                       MRN: [id number] Proc. Date: 08/18/00 Adm. Date:  [id number] Attending:  Tressie Stalker CC:         Arturo Morton. Riley Kill, M.D. [facility]             Feliciana Rossetti, M.D.             CVTS office                           Operative Report  PREOPERATIVE DIAGNOSIS:  Severe three-vessel coronary artery disease, status post acute myocardial infarction.  POSTOPERATIVE DIAGNOSIS:  Severe three-vessel coronary artery disease, status post acute myocardial infarction.  OPERATION PERFORMED:  Median sternotomy for coronary artery bypass grafting x 4 (left internal mammary artery to distal left anterior descending coronary artery, right internal mammary artery to distal right coronary artery, left radial artery to second circumflex marginal branch, saphenous vein graft to second diagonal branch).  SURGEON:  Salvatore Decent. Cornelius Moras, M.D.  ASSISTANT:  Areta Haber, P.A.  ANESTHESIA:  INDICATIONS FOR PROCEDURE:  The patient is a 55 year old white male from [place], [place] followed by Dr. Quintella Reichert and referred by Dr. Riley Kill for management of coronary artery disease.  The patient presents with new onset substernal chest pain at rest and rules in for an acute non-Q wave myocardial infarction.  Cardiac catheterization performed by Dr. Riley Kill demonstrates severe three-vessel coronary artery disease with preserved left ventricular function.  OPERATIVE CONSENT:  The patient and the family were counseled at length regarding the indications and potential benefits of coronary artery bypass grafting.  They understand the associated risks of surgery including but not limited to the risks of death, stroke, myocardial infarction, bleeding requiring blood transfusion, arrhythmia, infection and recurrent coronary artery disease.  DESCRIPTION OF PROCEDURE:  The patient was brought to the operating room on the  above-mentioned date and invasive hemodynamic monitoring was established by the anesthesia service under the care and direction of Dr. Adonis Huguenin.  The patient was placed in the supine position on the operating table.  Following induction with general endotracheal anesthesia, the patients chest, abdomen, both groins, and both lower extremities were prepped and draped in a sterile manner. The entire left upper extremity was prepared and draped in a sterile manner as well.  Prior to preparation, the patients left hand was carefully examined and a pulse oximetry probe was placed on the left index finger.  The left radial pulse was manually obliterated and the pulse oximetry wave form did not change, confirming the presence of a patent palmar arch circulation as previously documented prior to surgery.  Following sterile preparation and drape as previously described, a longitudinal incision was made on the volar aspect of the left forearm.  The left radial artery was harvested to be utilized as a conduit for bypass grafting through this longitudinal incision.  Sharp dissection was used with a paucity of electrocautery.  The superficial branch of the radial nerve was identified and preserved during the dissection.  All arterial and venous branches of the radial artery and its venae comitantes were divided between Hemoclips. Just prior to completion of the mobilization, the patient was heparinized systemically.  The left radial artery was briefly occluded and a pulse could still be palpated in the artery distal to the  level of occlusion. The left radial artery was then transected just above the wrist and the distal end was doubly oversewn with silk sutures.  The portion of the radial artery to be utilized as a conduit was flushed with heparinized saline solution and subsequently inspected for hemostasis.  The proximal end was then divided and the proximal end of the artery was doubly oversewn with  silk sutures.  The radial artery conduit was placed in a small bath of heparinized patients blood to be utilized as stored solution.  The wound was irrigated with saline solution and meticulous hemostasis was ascertained.  The wound was closed with a running two-layered closure of absorbable suture.  Simultaneously to dissection of the left radial artery, a median sternotomy incision was performed and the left internal mammary artery was dissected from the chest wall and prepared for bypass grafting.  Subsequently, the right internal mammary artery was dissected from the chest wall and prepared for bypass grafting.  Both internal mammary arteries were noted to be medium-sized caliber vessels at best but they are good quality and had excellent forward flow.  The left radial artery was notably a medium-sized vessel but had good flow prior to its removal.  A longitudinal incision was made just above the ankle on the right lower leg and saphenous vein was dissected from associated structures.  The greater saphenous vein in the lower leg was notably very sclerotic and felt to be poor quality conduit.  Subsequently, a new longitudinal incision was made in the right thigh beginning just below the right groin.  A segment of saphenous vein was obtained from the right thigh to be be utilized as bypass conduit.  The saphenous vein was of fair quality, was notably fairly thickened and had several varicosities.  These areas were excluded.  The pericardium was opened.  The ascending aorta was inspected and was notablyk free of any palpable plaque and calcification.  The ascending aorta and the right atrial appendage were cannulated for cardiopulmonary bypass. Adequate heparinization was verified.  Cardiopulmonary bypass was begun and the surface of the heart was inspected. Distal sites were selected for coronary bypass grafting.  The left radial artery, both internal mammary arteries and the  saphenous vein were all trimmed  to appropriate length.  The temperature probe was placed in the left ventricular septum and the styrofoam pad was placed to protect the left phrenic nerve from thermal injury.  The cardioplegia catheter was placed in the ascending aorta.  The patient was cooled to 32 degrees systemic temperature. The aortic crossclamp was applied and cardioplegia was delivered in an antegrade fashion through the aortic root.  Iced saline slush was applied for topical hypothermia.  The initial cardioplegic arrest was rapid and cooling was excellent.  Additional doses of cardioplegia were administered intermittently throughout the crossclamp portion of the operation both through the aortic root and down the subsequently placed vein graft to maintain septal temperature below 15 degrees centigrade.  The following distal coronary anastomoses were performed:  (1) The second circumflex marginal branch was grafted using the left radial artery in an end-to-side fashion using running 8-0 Prolene suture.  This coronary measures 1.7 mm in diameter and is of good quality.  (2) The second diagonal branch off the left anterior descending coronary artery was grafted with a saphenous vein graft in end-to-side fashion using running 7-0 Prolene suture.  This coronary measured 1.5 mm in diameter and was of fair to good quality.  (3) The distal right  coronary artery was grafted with right internal mammary artery using running 8-0 Prolene suture. This coronary measures 2 mm in diameter and is of good quality although there is some atherosclerosis present within the wall of the vessel throughout its course.  It has a very large lumen and a 2 mm probe will pass down beyond the bifurcation of the distal right coronary artery.  The posterior descending coronary artery and posterolateral coronary arteries appeared normal.  (4) The distal left anterior descending coronary artery was grafted to the  left internal mammary artery using running 8-0 Prolene suture.  This coronary measures 1.7 mm at the site of distal bypass and is deeply intramyocardial. This dissection is quite difficult due to the intramyocardial nature of the vessel.  This coronary is of fair to good quality.  Initially, after reperfusion of the left internal mammary artery, the septal temperature was noted to rise somewhat sluggishly.  Therefore, the mammary artery was re-occluded and an additional dose of cardioplegia was administered.  The distal anastomosis of the left internal mammary artery was performed again using running 8-0 Prolene suture.  After removal of the first anastomosis there was no sign of any abnormality and the anastomosis appeared patent.  A 1.5 mm probe would easily pass down the distal vessel.  Upon release of both the left and right internal mammary arteries, the septal temperature was noted to rise appropriately.  Both proximal anastomoses were performed directly to the ascending aorta prior to removal of the aortic crossclamp.  The proximal anastomosis of the left radial artery was placed to a small button of saphenous vein due to size mismatch between the proximal end of the radial artery and the ascending aorta.  The aortic crossclamp was removed after removing all air from the aortic root.  The aortic crossclamp was removed after a total crossclamp time of 106 minutes.  The heart began to beat spontaneously without need for cardioversion.  All proximal and distal anastomoses were inspected for hemostasis and appropriate graft orientation.  Epicardial pacing wires were fixed to the right ventricular outflow tract and to the right atrial appendage.  The patient was weaned from cardiopulmonary bypass without difficulty.  The patients rhythm at separation from bypass was sinus bradycardia requiring dual chamber AV sequential pacing.  Total cardiopulmonary bypass time for the operation  was 143 minutes.  Low dose dopamine infusion was begun after separation from bypass.  The patients existing radial arterial line does not appear to be functioning appropriately and temporarily and Angiocatheter was placed in the ascending aorta to allow appropriate monitoring of the blood pressure. Ultimately, the patients radial artery line began to function more appropriately and the cannula in the ascending aorta was removed.  The venous and arterial cannulae were removed uneventfully.  Protamine was administered to reverse the anticoagulation.  The mediastinum and both the left and right pleural spaces were irrigated with saline solution containing vancomycin.  Meticulous surgical hemostasis was ascertained.  The mediastinum and both left and right pleural spaces were drained with four chest tubes placed through separate stab incisions inferiorly.  The median sternotomy was closed in routine fashion.  The right lower extremity incisions were closed in multiple layers in routine fashion.  All skin incisions were closed with subcuticular skin closures.  The patient tolerated the procedure well.  The patient remained in the operating room for an excess of two hours after completion of the operation due to unavailability of a bed in the surgical intensive care unit.  The patients existing Swan-Ganz catheter had to be pulled back intraoperatively during the case and subsequently a new Swan-Ganz catheter was replaced by Dr. Krista Blue after completion of the procedure.  Ultimately after that availability became apparent, the patient was subsequently transferred to the surgical intensive care unit in stable condition.  There were no intraoperative complications.  All sponge, needle and instrument counts were verified correct at the completion of the operation.  No autologous blood products were administered. DD:  08/18/00 TD:  08/19/00 Job: [id number]

## 2011-07-20 ENCOUNTER — Ambulatory Visit (HOSPITAL_COMMUNITY)
Admission: RE | Admit: 2011-07-20 | Discharge: 2011-07-20 | Disposition: A | Discharge status: Home / Self Care | Payer: 59 | Source: Ambulatory Visit | Attending: Internal Medicine | Admitting: Internal Medicine

## 2011-07-20 ENCOUNTER — Other Ambulatory Visit: Payer: Self-pay | Admitting: Internal Medicine

## 2011-07-20 ENCOUNTER — Encounter: Payer: 59 | Admitting: Internal Medicine

## 2011-07-20 DIAGNOSIS — M479 Spondylosis, unspecified: Secondary | ICD-10-CM | POA: Insufficient documentation

## 2011-07-20 DIAGNOSIS — Z85118 Personal history of other malignant neoplasm of bronchus and lung: Secondary | ICD-10-CM | POA: Insufficient documentation

## 2011-07-20 DIAGNOSIS — J90 Pleural effusion, not elsewhere classified: Secondary | ICD-10-CM | POA: Insufficient documentation

## 2011-07-20 DIAGNOSIS — C3490 Malignant neoplasm of unspecified part of unspecified bronchus or lung: Secondary | ICD-10-CM

## 2011-07-20 DIAGNOSIS — Z09 Encounter for follow-up examination after completed treatment for conditions other than malignant neoplasm: Secondary | ICD-10-CM | POA: Insufficient documentation

## 2011-07-20 DIAGNOSIS — C7B8 Other secondary neuroendocrine tumors: Secondary | ICD-10-CM

## 2011-07-20 LAB — CBC WITH DIFFERENTIAL/PLATELET
BASO%: 0.6 % (ref 0.0–2.0)
Eosinophils Absolute: 0.2 10*3/uL (ref 0.0–0.5)
HCT: 42.5 % (ref 38.4–49.9)
MCHC: 34.8 g/dL (ref 32.0–36.0)
MONO#: 0.5 10*3/uL (ref 0.1–0.9)
NEUT#: 4.8 10*3/uL (ref 1.5–6.5)
NEUT%: 66.2 % (ref 39.0–75.0)
WBC: 7.2 10*3/uL (ref 4.0–10.3)
lymph#: 1.8 10*3/uL (ref 0.9–3.3)

## 2011-07-20 LAB — CMP (CANCER CENTER ONLY)
ALT(SGPT): 75 U/L — ABNORMAL HIGH (ref 10–47)
CO2: 22 mEq/L (ref 18–33)
Calcium: 9.2 mg/dL (ref 8.0–10.3)
Chloride: 109 mEq/L — ABNORMAL HIGH (ref 98–108)
Creat: 0.8 mg/dl (ref 0.6–1.2)
Glucose, Bld: 103 mg/dL (ref 73–118)
Sodium: 145 mEq/L (ref 128–145)
Total Protein: 6.7 g/dL (ref 6.4–8.1)

## 2011-07-20 MED ORDER — IOHEXOL 300 MG/ML  SOLN
80.0000 mL | Freq: Once | INTRAMUSCULAR | Status: AC | PRN
  Administered 2011-07-20: 80 mL via INTRAVENOUS

## 2011-07-23 ENCOUNTER — Encounter (HOSPITAL_BASED_OUTPATIENT_CLINIC_OR_DEPARTMENT_OTHER): Payer: 59 | Admitting: Internal Medicine

## 2011-07-23 DIAGNOSIS — C7A090 Malignant carcinoid tumor of the bronchus and lung: Secondary | ICD-10-CM

## 2012-01-13 ENCOUNTER — Telehealth: Payer: Self-pay | Admitting: Internal Medicine

## 2012-01-13 NOTE — Telephone Encounter (Signed)
pt l/m to r/s appts,new appts made,pt aware and mailed    aom

## 2012-01-14 ENCOUNTER — Other Ambulatory Visit: Payer: 59 | Admitting: Lab

## 2012-01-21 ENCOUNTER — Ambulatory Visit: Payer: 59 | Admitting: Internal Medicine

## 2012-01-27 ENCOUNTER — Other Ambulatory Visit (HOSPITAL_BASED_OUTPATIENT_CLINIC_OR_DEPARTMENT_OTHER): Payer: 59 | Admitting: Lab

## 2012-01-27 DIAGNOSIS — C7A090 Malignant carcinoid tumor of the bronchus and lung: Secondary | ICD-10-CM

## 2012-01-27 LAB — COMPREHENSIVE METABOLIC PANEL
ALT: 125 U/L — ABNORMAL HIGH (ref 0–53)
AST: 74 U/L — ABNORMAL HIGH (ref 0–37)
Albumin: 4.1 g/dL (ref 3.5–5.2)
Calcium: 9.6 mg/dL (ref 8.4–10.5)
Chloride: 107 mEq/L (ref 96–112)
Creatinine, Ser: 0.9 mg/dL (ref 0.50–1.35)
Potassium: 4.3 mEq/L (ref 3.5–5.3)
Sodium: 140 mEq/L (ref 135–145)

## 2012-01-27 LAB — CBC WITH DIFFERENTIAL/PLATELET
BASO%: 0.5 % (ref 0.0–2.0)
MCHC: 34.6 g/dL (ref 32.0–36.0)
MONO#: 0.5 10*3/uL (ref 0.1–0.9)
RBC: 4.42 10*6/uL (ref 4.20–5.82)
RDW: 14.3 % (ref 11.0–14.6)
WBC: 7.5 10*3/uL (ref 4.0–10.3)
lymph#: 1.8 10*3/uL (ref 0.9–3.3)
nRBC: 0 % (ref 0–0)

## 2012-02-04 ENCOUNTER — Ambulatory Visit: Payer: 59 | Admitting: Internal Medicine

## 2012-03-16 ENCOUNTER — Telehealth: Payer: Self-pay | Admitting: Internal Medicine

## 2012-03-16 ENCOUNTER — Other Ambulatory Visit: Payer: Self-pay | Admitting: Internal Medicine

## 2012-03-16 DIAGNOSIS — C3490 Malignant neoplasm of unspecified part of unspecified bronchus or lung: Secondary | ICD-10-CM

## 2012-03-16 NOTE — Telephone Encounter (Signed)
called pt with appts,pt didnt show for last appt because the ct ahd not been scheduled.  found info in mosaiq and sch pt,pt aware of appts   som

## 2012-03-17 ENCOUNTER — Other Ambulatory Visit: Payer: Self-pay | Admitting: *Deleted

## 2012-03-17 DIAGNOSIS — C3490 Malignant neoplasm of unspecified part of unspecified bronchus or lung: Secondary | ICD-10-CM

## 2012-03-21 ENCOUNTER — Ambulatory Visit (HOSPITAL_COMMUNITY)
Admission: RE | Admit: 2012-03-21 | Discharge: 2012-03-21 | Disposition: A | Discharge status: Home / Self Care | Payer: 59 | Source: Ambulatory Visit | Attending: Internal Medicine | Admitting: Internal Medicine

## 2012-03-21 ENCOUNTER — Other Ambulatory Visit (HOSPITAL_BASED_OUTPATIENT_CLINIC_OR_DEPARTMENT_OTHER): Payer: 59 | Admitting: Lab

## 2012-03-21 DIAGNOSIS — J984 Other disorders of lung: Secondary | ICD-10-CM | POA: Insufficient documentation

## 2012-03-21 DIAGNOSIS — C3490 Malignant neoplasm of unspecified part of unspecified bronchus or lung: Secondary | ICD-10-CM | POA: Insufficient documentation

## 2012-03-21 DIAGNOSIS — I517 Cardiomegaly: Secondary | ICD-10-CM | POA: Insufficient documentation

## 2012-03-21 LAB — CBC WITH DIFFERENTIAL/PLATELET
BASO%: 0.7 % (ref 0.0–2.0)
EOS%: 2.4 % (ref 0.0–7.0)
HCT: 41.2 % (ref 38.4–49.9)
MCH: 33.3 pg (ref 27.2–33.4)
MCHC: 34.5 g/dL (ref 32.0–36.0)
NEUT%: 64.8 % (ref 39.0–75.0)
lymph#: 1.7 10*3/uL (ref 0.9–3.3)

## 2012-03-21 LAB — CMP (CANCER CENTER ONLY)
ALT(SGPT): 111 U/L — ABNORMAL HIGH (ref 10–47)
AST: 73 U/L — ABNORMAL HIGH (ref 11–38)
BUN, Bld: 12 mg/dL (ref 7–22)
CO2: 26 mEq/L (ref 18–33)
Creat: 1.1 mg/dl (ref 0.6–1.2)
Total Bilirubin: 0.7 mg/dl (ref 0.20–1.60)

## 2012-03-21 MED ORDER — IOHEXOL 300 MG/ML  SOLN
100.0000 mL | Freq: Once | INTRAMUSCULAR | Status: AC | PRN
  Administered 2012-03-21: 100 mL via INTRAVENOUS

## 2012-03-23 ENCOUNTER — Ambulatory Visit (HOSPITAL_BASED_OUTPATIENT_CLINIC_OR_DEPARTMENT_OTHER): Payer: 59 | Admitting: Internal Medicine

## 2012-03-23 VITALS — BP 146/78 | HR 84 | Temp 97.2°F | Ht 72.0 in | Wt 300.9 lb

## 2012-03-23 DIAGNOSIS — C3490 Malignant neoplasm of unspecified part of unspecified bronchus or lung: Secondary | ICD-10-CM | POA: Insufficient documentation

## 2012-03-23 DIAGNOSIS — R0602 Shortness of breath: Secondary | ICD-10-CM

## 2012-03-23 DIAGNOSIS — C7A090 Malignant carcinoid tumor of the bronchus and lung: Secondary | ICD-10-CM

## 2012-03-23 NOTE — Progress Notes (Signed)
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  PRIOR THERAPY:   1. Status post left lower lobectomy under the care of Dr. Cornelius Moras on 01/29/2009. 2. Status post 4 cycles of adjuvant chemotherapy.  First cycle was given with cisplatin and docetaxel, discontinued secondary to intolerance.  The patient completed 3 more cycles with carboplatin and docetaxel.  Last dose was given 05/27/2009.  CURRENT THERAPY:  Observation.  INTERVAL HISTORY: Sean Gardner 56 y.o. male returns to the clinic today for followup visit. The patient was last seen in October of 2012. He still complaining of shortness breath with exertion and mild cough. He gained several pounds since his last visit. The patient denied having any nausea or vomiting. He has repeat CT scan of the chest performed recently and he is here today for evaluation and discussion of his scan results.  MEDICAL HISTORY: Past Medical History  Diagnosis Date  . Lung cancer     lung ca dx 2010  . Hypertension     ALLERGIES:  is allergic to zolpidem tartrate.  MEDICATIONS:  Current Outpatient Prescriptions  Medication Sig Dispense Refill  . aspirin 81 MG tablet Take 81 mg by mouth daily.      . budesonide-formoterol (SYMBICORT) 80-4.5 MCG/ACT inhaler Inhale 2 puffs into the lungs 2 (two) times daily.      . clopidogrel (PLAVIX) 75 MG tablet Take 75 mg by mouth daily.      Marland Kitchen lisinopril (PRINIVIL,ZESTRIL) 20 MG tablet Take 20 mg by mouth daily.      . metoprolol (LOPRESSOR) 50 MG tablet Take 50 mg by mouth 2 (two) times daily.      . ranitidine (ZANTAC) 300 MG capsule Take 300 mg by mouth 2 (two) times daily.      . rosuvastatin (CRESTOR) 20 MG tablet Take 20 mg by mouth daily.      Marland Kitchen tiotropium (SPIRIVA) 18 MCG inhalation capsule Place 18 mcg into  inhaler and inhale daily.        REVIEW OF SYSTEMS:  A comprehensive review of systems was negative except for: Constitutional: positive for fatigue Respiratory: positive for dyspnea on exertion   PHYSICAL EXAMINATION: General appearance: alert, cooperative and no distress Head: Normocephalic, without obvious abnormality, atraumatic Neck: no adenopathy Lymph nodes: Cervical, supraclavicular, and axillary nodes normal. Resp: clear to auscultation bilaterally Cardio: regular rate and rhythm, S1, S2 normal, no murmur, click, rub or gallop GI: soft, non-tender; bowel sounds normal; no masses,  no organomegaly Extremities: extremities normal, atraumatic, no cyanosis or edema Neurologic: Alert and oriented X 3, normal strength and tone. Normal symmetric reflexes. Normal coordination and gait  ECOG PERFORMANCE STATUS: 1 - Symptomatic but completely ambulatory  Blood pressure 146/78, pulse 84, temperature 97.2 F (36.2 C), temperature source Oral, height 6' (1.829 m), weight 300 lb 14.4 oz (136.487 kg).  LABORATORY DATA: Lab Results  Component Value Date   WBC 6.8 03/21/2012   HGB 14.2 03/21/2012   HCT 41.2 03/21/2012   MCV 96.6 03/21/2012   PLT 249 03/21/2012      Chemistry      Component Value Date/Time   NA 142 03/21/2012 1556   NA 140 01/27/2012 0930   K 4.2 03/21/2012 1556   K 4.3 01/27/2012 0930   CL 104 03/21/2012 1556   CL 107  01/27/2012 0930   CO2 26 03/21/2012 1556   CO2 24 01/27/2012 0930   BUN 12 03/21/2012 1556   BUN 11 01/27/2012 0930   CREATININE 1.1 03/21/2012 1556   CREATININE 0.90 01/27/2012 0930      Component Value Date/Time   CALCIUM 9.3 03/21/2012 1556   CALCIUM 9.6 01/27/2012 0930   ALKPHOS 59 03/21/2012 1556   ALKPHOS 54 01/27/2012 0930   AST 73* 03/21/2012 1556   AST 74* 01/27/2012 0930   ALT 125* 01/27/2012 0930   BILITOT 0.70 03/21/2012 1556   BILITOT 0.6 01/27/2012 0930       RADIOGRAPHIC STUDIES: Ct Chest W Contrast  03/21/2012  *RADIOLOGY REPORT*  Clinical Data: Follow-up  lung cancer.  CT CHEST WITH CONTRAST  Technique:  Multidetector CT imaging of the chest was performed following the standard protocol during bolus administration of intravenous contrast.  Contrast: OMNIPAQUE IOHEXOL 300 MG/ML  SOLN  Comparison: 07/20/2011.  Findings: Mediastinal and right hilar lymph nodes are not enlarged by CT size criteria.  No axillary or left hilar adenopathy.  Heart is mildly enlarged.  There is lipomatous hypertrophy of the interatrial septum.  No pericardial effusion.  Postoperative changes are seen in the right hemithorax, stable. Left lower lobectomy.  Scattered scarring in the left upper lobe. Trace pleural fluid is loculated in the inferomedial left hemithorax, as before.  There may be adherent mucus along the right lateral tracheal wall.  Airway is otherwise unremarkable.  Incidental imaging of the upper abdomen shows no acute findings. No worrisome lytic or sclerotic lesions.  Degenerative changes are seen in the spine.  IMPRESSION: Postoperative changes in the hemithoraces bilaterally without evidence of recurrent or metastatic disease.  Original Report Authenticated By: Reyes Ivan, M.D.    ASSESSMENT: This is a very pleasant 56 years old white male with history of stage II a non-small cell lung cancer status post resection followed by 4 cycles of adjuvant chemotherapy. He has been observation since August of 2010. The patient has no evidence for disease recurrence.  PLAN: I discussed the scan results with the patient today. I recommended for him continuous observation with repeat CT scan of the chest and 6 months. He would come back for followup visit at that time. He was advised to call me immediately she has any concerning symptoms in the interval. Regarding his persistent shortness of breath the patient was advised with referral from his primary care physician to pulmonary medicine for evaluation.  All questions were answered. The patient knows to call the  clinic with any problems, questions or concerns. We can certainly see the patient much sooner if necessary.

## 2012-07-18 DIAGNOSIS — Z0271 Encounter for disability determination: Secondary | ICD-10-CM

## 2012-09-19 ENCOUNTER — Telehealth: Payer: Self-pay | Admitting: Internal Medicine

## 2012-09-19 NOTE — Telephone Encounter (Signed)
Pt came in this morning thinking his appts were today for the lab and scan.  Appts are 12/3.  Pt then stated that he did not get a reminder call and that he wanted to just cx all the appts.  Melissa did look and the 11/28/thanksging day calls should have gone out on 11/29 for 12/2 and they did not go out at all.  Called the pt and lm for him to call back to see if he still wanted to keep the existing appts or r/s      anne

## 2012-09-19 NOTE — Telephone Encounter (Signed)
called pt to see what he wanted to do about his appts and he req to keep them as sch ....12/3 9:00 and  12/5 9:00

## 2012-09-20 ENCOUNTER — Telehealth: Payer: Self-pay | Admitting: Internal Medicine

## 2012-09-20 ENCOUNTER — Ambulatory Visit (HOSPITAL_COMMUNITY): Payer: 59

## 2012-09-20 ENCOUNTER — Other Ambulatory Visit: Payer: 59

## 2012-09-20 NOTE — Telephone Encounter (Signed)
pt called to r/s all appts due to feeling ill today,done,aware               Sean Gardner

## 2012-09-22 ENCOUNTER — Ambulatory Visit: Payer: 59 | Admitting: Internal Medicine

## 2012-09-27 ENCOUNTER — Other Ambulatory Visit (HOSPITAL_BASED_OUTPATIENT_CLINIC_OR_DEPARTMENT_OTHER): Payer: 59

## 2012-09-27 ENCOUNTER — Ambulatory Visit (HOSPITAL_COMMUNITY)
Admission: RE | Admit: 2012-09-27 | Discharge: 2012-09-27 | Disposition: A | Discharge status: Home / Self Care | Payer: 59 | Source: Ambulatory Visit | Attending: Internal Medicine | Admitting: Internal Medicine

## 2012-09-27 DIAGNOSIS — R0602 Shortness of breath: Secondary | ICD-10-CM | POA: Insufficient documentation

## 2012-09-27 DIAGNOSIS — J984 Other disorders of lung: Secondary | ICD-10-CM | POA: Insufficient documentation

## 2012-09-27 DIAGNOSIS — Z951 Presence of aortocoronary bypass graft: Secondary | ICD-10-CM | POA: Insufficient documentation

## 2012-09-27 DIAGNOSIS — I700 Atherosclerosis of aorta: Secondary | ICD-10-CM | POA: Insufficient documentation

## 2012-09-27 DIAGNOSIS — Z9221 Personal history of antineoplastic chemotherapy: Secondary | ICD-10-CM | POA: Insufficient documentation

## 2012-09-27 DIAGNOSIS — C3490 Malignant neoplasm of unspecified part of unspecified bronchus or lung: Secondary | ICD-10-CM | POA: Insufficient documentation

## 2012-09-27 DIAGNOSIS — Z902 Acquired absence of lung [part of]: Secondary | ICD-10-CM | POA: Insufficient documentation

## 2012-09-27 DIAGNOSIS — I2510 Atherosclerotic heart disease of native coronary artery without angina pectoris: Secondary | ICD-10-CM | POA: Insufficient documentation

## 2012-09-27 LAB — COMPREHENSIVE METABOLIC PANEL (CC13)
ALT: 123 U/L — ABNORMAL HIGH (ref 0–55)
CO2: 22 mEq/L (ref 22–29)
Calcium: 9.4 mg/dL (ref 8.4–10.4)
Chloride: 110 mEq/L — ABNORMAL HIGH (ref 98–107)
Glucose: 90 mg/dl (ref 70–99)
Sodium: 139 mEq/L (ref 136–145)
Total Bilirubin: 0.43 mg/dL (ref 0.20–1.20)
Total Protein: 6.7 g/dL (ref 6.4–8.3)

## 2012-09-27 LAB — CBC WITH DIFFERENTIAL/PLATELET
BASO%: 0.9 % (ref 0.0–2.0)
Eosinophils Absolute: 0.2 10*3/uL (ref 0.0–0.5)
MCHC: 34.1 g/dL (ref 32.0–36.0)
MONO#: 0.5 10*3/uL (ref 0.1–0.9)
NEUT#: 4.8 10*3/uL (ref 1.5–6.5)
Platelets: 240 10*3/uL (ref 140–400)
RBC: 4.31 10*6/uL (ref 4.20–5.82)
RDW: 14.4 % (ref 11.0–14.6)
WBC: 6.7 10*3/uL (ref 4.0–10.3)
lymph#: 1.1 10*3/uL (ref 0.9–3.3)

## 2012-09-27 MED ORDER — IOHEXOL 300 MG/ML  SOLN
100.0000 mL | Freq: Once | INTRAMUSCULAR | Status: AC | PRN
  Administered 2012-09-27: 100 mL via INTRAVENOUS

## 2012-09-29 ENCOUNTER — Telehealth: Payer: Self-pay | Admitting: Internal Medicine

## 2012-09-29 ENCOUNTER — Ambulatory Visit (HOSPITAL_BASED_OUTPATIENT_CLINIC_OR_DEPARTMENT_OTHER): Payer: 59 | Admitting: Internal Medicine

## 2012-09-29 ENCOUNTER — Encounter: Payer: Self-pay | Admitting: Internal Medicine

## 2012-09-29 VITALS — BP 137/84 | HR 88 | Temp 97.0°F | Resp 20 | Ht 72.0 in | Wt 303.2 lb

## 2012-09-29 DIAGNOSIS — C7A090 Malignant carcinoid tumor of the bronchus and lung: Secondary | ICD-10-CM

## 2012-09-29 DIAGNOSIS — R0602 Shortness of breath: Secondary | ICD-10-CM

## 2012-09-29 DIAGNOSIS — C3490 Malignant neoplasm of unspecified part of unspecified bronchus or lung: Secondary | ICD-10-CM

## 2012-09-29 NOTE — Progress Notes (Signed)
Pt request records so he signed a  release and I sent it to Med records.

## 2012-09-29 NOTE — Telephone Encounter (Signed)
Gave pt appt for December 2013, template for 2015 not available, pt will call and r/s appt to January per POF

## 2012-09-29 NOTE — Progress Notes (Signed)
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   PRIOR THERAPY:  1. Status post left lower lobectomy under the care of Dr. Cornelius Gardner on 01/29/2009. 2. Status post 4 cycles of adjuvant chemotherapy. First cycle was given with cisplatin and docetaxel, discontinued secondary to intolerance. The patient completed 3 more cycles with carboplatin and docetaxel. Last dose was given 05/27/2009.  CURRENT THERAPY: Observation.  INTERVAL HISTORY: Sean Gardner 56 y.o. male returns to the clinic today for routine six-month followup visit. The patient is feeling fine today with no specific complaints except for shortness breath with exertion. He denied having any significant chest pain, cough or hemoptysis. The patient denied having any significant weight loss or night sweats. He had repeat CT scan of the chest performed recently and he is here for evaluation and discussion of his scan results.  MEDICAL HISTORY: Past Medical History  Diagnosis Date  . Lung cancer     lung ca dx 2010  . Hypertension     ALLERGIES:  is allergic to zolpidem tartrate.  MEDICATIONS:  Current Outpatient Prescriptions  Medication Sig Dispense Refill  . aspirin 81 MG tablet Take 81 mg by mouth daily.      . budesonide-formoterol (SYMBICORT) 80-4.5 MCG/ACT inhaler Inhale 2 puffs into the lungs 2 (two) times daily.      . clopidogrel (PLAVIX) 75 MG tablet Take 75 mg by mouth daily.      Marland Kitchen lisinopril (PRINIVIL,ZESTRIL) 20 MG tablet Take 20 mg by mouth daily.      . metoprolol (LOPRESSOR) 50 MG tablet Take 50 mg by mouth 2 (two) times daily.      . ranitidine (ZANTAC) 300 MG capsule Take 300 mg by mouth 2 (two) times daily.      . rosuvastatin (CRESTOR) 20 MG tablet Take 20 mg by  mouth daily.      Marland Kitchen tiotropium (SPIRIVA) 18 MCG inhalation capsule Place 18 mcg into inhaler and inhale daily.      . Vitamin D, Ergocalciferol, (DRISDOL) 50000 UNITS CAPS Take 1 capsule by mouth Daily.        REVIEW OF SYSTEMS:  A comprehensive review of systems was negative except for: Respiratory: positive for dyspnea on exertion   PHYSICAL EXAMINATION: General appearance: alert, cooperative and no distress Head: Normocephalic, without obvious abnormality, atraumatic Neck: no adenopathy Resp: clear to auscultation bilaterally Cardio: regular rate and rhythm, S1, S2 normal, no murmur, click, rub or gallop GI: soft, non-tender; bowel sounds normal; no masses,  no organomegaly Extremities: extremities normal, atraumatic, no cyanosis or edema  ECOG PERFORMANCE STATUS: 1 - Symptomatic but completely ambulatory  Blood pressure 137/84, pulse 88, temperature 97 F (36.1 C), temperature source Oral, resp. rate 20, height 6' (1.829 m), weight 303 lb 3.2 oz (137.531 kg).  LABORATORY DATA: Lab Results  Component Value Date   WBC 6.7 09/27/2012   HGB 14.4 09/27/2012   HCT 42.0 09/27/2012   MCV 97.6 09/27/2012   PLT 240 09/27/2012      Chemistry      Component Value Date/Time   NA 139 09/27/2012 1157   NA 142 03/21/2012 1556   NA 140 01/27/2012 0930   K 4.4 09/27/2012 1157   K 4.2 03/21/2012 1556   K 4.3  01/27/2012 0930   CL 110* 09/27/2012 1157   CL 104 03/21/2012 1556   CL 107 01/27/2012 0930   CO2 22 09/27/2012 1157   CO2 26 03/21/2012 1556   CO2 24 01/27/2012 0930   BUN 11.0 09/27/2012 1157   BUN 12 03/21/2012 1556   BUN 11 01/27/2012 0930   CREATININE 0.9 09/27/2012 1157   CREATININE 1.1 03/21/2012 1556   CREATININE 0.90 01/27/2012 0930      Component Value Date/Time   CALCIUM 9.4 09/27/2012 1157   CALCIUM 9.3 03/21/2012 1556   CALCIUM 9.6 01/27/2012 0930   ALKPHOS 61 09/27/2012 1157   ALKPHOS 59 03/21/2012 1556   ALKPHOS 54 01/27/2012 0930   AST 71* 09/27/2012 1157   AST 73* 03/21/2012  1556   AST 74* 01/27/2012 0930   ALT 123* 09/27/2012 1157   ALT 125* 01/27/2012 0930   BILITOT 0.43 09/27/2012 1157   BILITOT 0.70 03/21/2012 1556   BILITOT 0.6 01/27/2012 0930       RADIOGRAPHIC STUDIES: Ct Chest W Contrast  09/27/2012  *RADIOLOGY REPORT*  Clinical Data: History of lung cancer.  Chemotherapy completed in 2010.  Status post left lower lobectomy.  Shortness of breath with exertion.  Restaging scan.  CT CHEST WITH CONTRAST  Technique:  Multidetector CT imaging of the chest was performed following the standard protocol during bolus administration of intravenous contrast.  Contrast: OMNIPAQUE IOHEXOL 300 MG/ML  SOLN  Comparison: Chest CT 03/21/2012  Findings:  Mediastinum: Heart size is normal. There is no significant pericardial fluid, thickening or pericardial calcification. There is atherosclerosis of the thoracic aorta, the great vessels of the mediastinum and the coronary arteries, including calcified atherosclerotic plaque in the left main, left circumflex and right coronary arteries. Status post median sternotomy for CABG with a LIMA to the LAD. No pathologically enlarged mediastinal or hilar lymph nodes. The esophagus is unremarkable in appearance.  Lungs/Pleura: A small area of scarring in the inferior segment of the lingula is unchanged.  Status post left lower lobectomy.  No suspicious appearing pulmonary nodules or masses are identified. No pleural effusions.  Linear scar in the right upper lobe is also unchanged. Small amount of pleural thickening in the medial aspect of the left base is unchanged and presumably an area of scarring.  Upper Abdomen: Unremarkable.  Musculoskeletal: Median sternotomy wires. There are no aggressive appearing lytic or blastic lesions noted in the visualized portions of the skeleton.  IMPRESSION: 1.  Status post left lower lobectomy without evidence to suggest local recurrence of disease or new metastatic disease in the thorax. 2.  Additional  incidental findings, as above, similar to prior examinations.   Original Report Authenticated By: Sean Gardner, M.D.     ASSESSMENT: This is a very pleasant 56 years old white male with history of stage IIIa non-small cell lung cancer diagnosed in March of 2010 status post left lower lobectomy followed by adjuvant chemotherapy and the patient has been observation since August of 2010 with no evidence for disease recurrence.   PLAN: I discussed the scan results with the patient today. I recommended for him to continue on observation with repeat CT scan of the chest in one year.  He was advised to call me immediately if he has any concerning symptoms in the interval.  All questions were answered. The patient knows to call the clinic with any problems, questions or concerns. We can certainly see the patient much sooner if necessary.

## 2012-09-29 NOTE — Patient Instructions (Signed)
Your scan showed no evidence for disease recurrence. Followup in one year with repeat CT scan of the chest 

## 2012-10-11 ENCOUNTER — Encounter: Payer: Self-pay | Admitting: Internal Medicine

## 2012-10-11 ENCOUNTER — Ambulatory Visit (INDEPENDENT_AMBULATORY_CARE_PROVIDER_SITE_OTHER): Payer: 59 | Admitting: Internal Medicine

## 2012-10-11 VITALS — BP 134/84 | HR 87 | Temp 97.3°F | Ht 72.0 in | Wt 303.0 lb

## 2012-10-11 DIAGNOSIS — R0609 Other forms of dyspnea: Secondary | ICD-10-CM

## 2012-10-11 DIAGNOSIS — R0600 Dyspnea, unspecified: Secondary | ICD-10-CM | POA: Insufficient documentation

## 2012-10-11 DIAGNOSIS — I10 Essential (primary) hypertension: Secondary | ICD-10-CM

## 2012-10-11 MED ORDER — OLMESARTAN MEDOXOMIL 40 MG PO TABS: ORAL_TABLET | ORAL | Status: DC

## 2012-10-11 MED ORDER — BUDESONIDE-FORMOTEROL FUMARATE 160-4.5 MCG/ACT IN AERO: INHALATION_SPRAY | RESPIRATORY_TRACT | Status: DC

## 2012-10-11 NOTE — Progress Notes (Signed)
  Subjective:    Patient ID: Sean Gardner, male    DOB: [birth year] MRN: [id number]  HPI  9 yowm quit smoking  01/31/09 at time of LLobectomy with doe post op that progressed with only  about 20 lb wt gain since surgery  referred 10/11/2012 by Dr Jacinto Halim   10/11/2012 1st pulmonary on spriva  cc doe x 3.5 years to point where can walk 50 yards before gives out.  Also has spells where worse than baseline assoc with cough and chest and nasalcongestion and subjective wheeze. In term of noct symptoms p cpap applied Sleeping ok without nocturnal  or early am exacerbation  of respiratory  c/o's or need for noct saba. Also denies any obvious fluctuation of symptoms with weather or environmental changes or other aggravating or alleviating factors except as outlined above.  No obvious daytime variabilty or assoc excess mucus produciton or cp or chest tightness,  overt sinus or hb symptoms. No unusual exp hx or h/o childhood pna/ asthma or premature birth to his knowledge.     Review of Systems  Constitutional: Negative for fever and unexpected weight change.  HENT: Positive for congestion, sneezing and sinus pressure. Negative for ear pain, nosebleeds, sore throat, rhinorrhea, trouble swallowing, dental problem and postnasal drip.   Eyes: Negative for redness and itching.  Respiratory: Positive for cough, shortness of breath and wheezing. Negative for chest tightness.   Cardiovascular: Negative for palpitations and leg swelling.  Gastrointestinal: Negative for nausea and vomiting.  Genitourinary: Negative for dysuria.  Musculoskeletal: Negative for joint swelling.  Skin: Negative for rash.  Neurological: Negative for headaches.  Hematological: Does not bruise/bleed easily.  Psychiatric/Behavioral: Negative for dysphoric mood. The patient is not nervous/anxious.        Objective:   Physical Exam  Pleasant obese wm nad Wt Readings from Last 3 Encounters:  10/11/12 303 lb (137.44 kg)  09/29/12  303 lb 3.2 oz (137.531 kg)  03/23/12 300 lb 14.4 oz (136.487 kg)    HEENT mild turbinate edema.  Oropharynx no thrush or excess pnd or cobblestoning.  No JVD or cervical adenopathy. Mild accessory muscle hypertrophy. Trachea midline, nl thryroid. Chest was hyperinflated by percussion with diminished breath sounds and moderate increased exp time without wheeze. Hoover sign positive at mid inspiration. Regular rate and rhythm without murmur gallop or rub or increase P2 or edema.  Abd: no hsm, nl excursion. Ext warm without cyanosis or clubbing.    CT Chest 09/27/12 Status post left lower lobectomy without evidence to suggest  local recurrence of disease or new metastatic disease in the  thorax.       Assessment & Plan:

## 2012-10-11 NOTE — Patient Instructions (Addendum)
Work on inhaler technique:  relax and gently blow all the way out then take a nice smooth deep breath back in, triggering the inhaler at same time you start breathing in.  Hold for up to 5 seconds if you can.  Rinse and gargle with water when done   If your mouth or throat starts to bother you,   I suggest you time the inhaler to your dental care and after using the inhaler(s) brush teeth and tongue with a baking soda containing toothpaste and when you rinse this out, gargle with it first to see if this helps your mouth and throat.    Change symbicort 160 Take 2 puffs first thing in am and then another 2 puffs about 12 hours later.   Continue spiriva each am  Stop lisinopril and start benicar 40 mg one half daily  Please schedule a follow up office visit in 6 weeks, call sooner if needed with pft's

## 2012-10-11 NOTE — Assessment & Plan Note (Addendum)
  When respiratory symptoms begin or become refractory well after a patient reports complete smoking cessation,  Especially when this wasn't the case while they were smoking, a red flag is raised based on the work of Dr Primitivo Gauze which states:  if you quit smoking when your best day FEV1 is still well preserved it is highly unlikely you will progress to severe disease.  That is to say, once the smoking stops,  the symptoms should not suddenly erupt or markedly worsen.  If so, the differential diagnosis should include  obesity/deconditioning,  LPR/Reflux/Aspiration syndromes,  occult CHF, or  especially side effect of medications commonly used in this population like acei.   Suspect this is combination of factors with obesity deconditioning and possible acei leading the list of usual suspects.  rec therefore trial of max dose symbicort/spiriva and off acei x 6 weeks then return for pfts and consideration for referral to pulmonary rehab  The proper method of use, as well as anticipated side effects, of a metered-dose inhaler are discussed and demonstrated to the patient. Improved effectiveness after extensive coaching during this visit to a level of approximately  75%

## 2012-10-11 NOTE — Assessment & Plan Note (Signed)

## 2012-11-18 ENCOUNTER — Other Ambulatory Visit: Payer: Self-pay | Admitting: Gastroenterology

## 2012-11-25 ENCOUNTER — Ambulatory Visit (INDEPENDENT_AMBULATORY_CARE_PROVIDER_SITE_OTHER): Payer: 59 | Admitting: Internal Medicine

## 2012-11-25 ENCOUNTER — Encounter: Payer: Self-pay | Admitting: Internal Medicine

## 2012-11-25 VITALS — BP 142/70 | HR 86 | Temp 97.0°F | Ht 72.0 in | Wt 300.0 lb

## 2012-11-25 DIAGNOSIS — R0609 Other forms of dyspnea: Secondary | ICD-10-CM

## 2012-11-25 DIAGNOSIS — C3490 Malignant neoplasm of unspecified part of unspecified bronchus or lung: Secondary | ICD-10-CM

## 2012-11-25 DIAGNOSIS — I10 Essential (primary) hypertension: Secondary | ICD-10-CM

## 2012-11-25 DIAGNOSIS — R0600 Dyspnea, unspecified: Secondary | ICD-10-CM

## 2012-11-25 DIAGNOSIS — R0989 Other specified symptoms and signs involving the circulatory and respiratory systems: Secondary | ICD-10-CM

## 2012-11-25 LAB — PULMONARY FUNCTION TEST

## 2012-11-25 MED ORDER — OLMESARTAN MEDOXOMIL 40 MG PO TABS: ORAL_TABLET | ORAL | Status: DC

## 2012-11-25 NOTE — Assessment & Plan Note (Addendum)
-   hfa 75% p coaching 11/25/2012  -11/25/2012  Walked RA x 3 laps @ 185 ft each stopped due to end of study, no sob or desat - PFT's 11/25/2012 FEV1  2.54 (73%) ratio 75 and no change p B2,  DLCO 59 corrects to 109  Purely restrictive with no evidence of copd s/p LLobectomy so ok to stop spiriva.  We have not excluded asthma however and he's much better on present rx so try off spiriva first, continue symbicort then return here in 3 months to see if we can change to prn saba vs continue to use symbicort.

## 2012-11-25 NOTE — Progress Notes (Signed)
PFT done today. 

## 2012-11-25 NOTE — Assessment & Plan Note (Signed)
Trial off acei started 10/11/2012   Adequate control on present rx, reviewed need to stay off acei based on how well he's doing off it.   Defer final decision to Dr Jacinto Halim re bp rx but strongly rec avoid acei as in this case I think it made him look like bad copd/ab when I'm now not even sure he has any lung airway issues at all now that he's off acei.  rx benicar 40 mg one daily samples x one month to see Dr Reece Agar before samples run out

## 2012-11-25 NOTE — Progress Notes (Signed)
  Subjective:    Patient ID: Sean Gardner, male    DOB: 19-Dec-1955 MRN: [id number]    Brief patient profile:  31 yowm quit smoking  01/31/09 at time of LLobectomy with doe post op that progressed with only  about 20 lb wt gain since surgery  referred to pulmonary clinic 10/11/2012 by Dr Jacinto Halim for doe and found to have ? acei related cough/sob with no sign airflow obstruction by pfts 11/25/2012   HPI 10/11/2012 1st pulmonary on spriva  cc doe x 3.5 years to point where can walk 50 yards before gives out.  Also has spells where worse than baseline assoc with cough and chest and nasal congestion and subjective wheeze. rec Work on inhaler technique:  Change symbicort 160 Take 2 puffs first thing in am and then another 2 puffs about 12 hours later.  Continue spiriva each am Stop lisinopril and start benicar 40 mg one half daily Please schedule a follow up office visit in 6 weeks, call sooner if needed with pft's   11/25/2012 f/u ov/Coreena Rubalcava cc quite a bit better, getting  up driveway s stopping from sob, no more cough off acei.    In term of noct symptoms p cpap applied Sleeping ok without nocturnal  or early am exacerbation  of respiratory  c/o's or need for noct saba. Also denies any obvious fluctuation of symptoms with weather or environmental changes or other aggravating or alleviating factors except as outlined above.  No obvious daytime variabilty or assoc excess mucus produciton or cp or chest tightness,  overt sinus or hb symptoms. No unusual exp hx or h/o childhood pna/ asthma or premature birth to his knowledge.  ROS  The following are not active complaints unless bolded sore throat, dysphagia, dental problems, itching, sneezing,  nasal congestion or excess/ purulent secretions, ear ache,   fever, chills, sweats, unintended wt loss, pleuritic or exertional cp, hemoptysis,  orthopnea pnd or leg swelling, presyncope, palpitations, heartburn, abdominal pain, anorexia, nausea, vomiting, diarrhea  or  change in bowel or urinary habits, change in stools or urine, dysuria,hematuria,  rash, arthralgias, visual complaints, headache, numbness weakness or ataxia or problems with walking or coordination,  change in mood/affect or memory.              Objective:   Physical Exam  Pleasant obese wm nad 11/25/2012  300 Wt Readings from Last 3 Encounters:  10/11/12 303 lb (137.44 kg)  09/29/12 303 lb 3.2 oz (137.531 kg)  03/23/12 300 lb 14.4 oz (136.487 kg)    HEENT mild turbinate edema.  Oropharynx no thrush or excess pnd or cobblestoning.  No JVD or cervical adenopathy. Mild accessory muscle hypertrophy. Trachea midline, nl thryroid. Chest was hyperinflated by percussion with diminished breath sounds and moderate increased exp time without wheeze. Hoover sign positive at mid inspiration. Regular rate and rhythm without murmur gallop or rub or increase P2 or edema.  Abd: no hsm, nl excursion. Ext warm without cyanosis or clubbing.    CT Chest 09/27/12 Status post left lower lobectomy without evidence to suggest  local recurrence of disease or new metastatic disease in the  thorax.       Assessment & Plan:

## 2012-11-25 NOTE — Patient Instructions (Addendum)
Stop spiriva to see if you notice a difference walking up the driveway and if if doing great after a few weeks ok to cut back on the symbicort to where you just use it as needed for cough or short of breath.  Work on inhaler technique:  relax and gently blow all the way out then take a nice smooth deep breath back in, triggering the inhaler at same time you start breathing in.  Hold for up to 5 seconds if you can.  Rinse and gargle with water when done   If your mouth or throat starts to bother you,   I suggest you time the inhaler to your dental care and after using the inhaler(s) brush teeth and tongue with a baking soda containing toothpaste and when you rinse this out, gargle with it first to see if this helps your mouth and throat.      Please schedule a follow up visit in 3 months but call sooner if needed

## 2012-12-07 ENCOUNTER — Encounter: Payer: Self-pay | Admitting: Internal Medicine

## 2013-01-09 ENCOUNTER — Encounter (HOSPITAL_COMMUNITY): Payer: Self-pay | Admitting: Pharmacy Technician

## 2013-01-17 ENCOUNTER — Other Ambulatory Visit: Payer: Self-pay | Admitting: Gastroenterology

## 2013-01-17 NOTE — Addendum Note (Signed)
Addended by: Willis Modena on: 01/17/2013 05:16 PM   Modules accepted: Orders

## 2013-01-18 ENCOUNTER — Ambulatory Visit (HOSPITAL_COMMUNITY)
Admission: RE | Admit: 2013-01-18 | Discharge: 2013-01-18 | Disposition: A | Discharge status: Home / Self Care | Payer: 59 | Source: Ambulatory Visit | Attending: Gastroenterology | Admitting: Gastroenterology

## 2013-01-18 ENCOUNTER — Encounter (HOSPITAL_COMMUNITY): Payer: Self-pay | Admitting: *Deleted

## 2013-01-18 ENCOUNTER — Encounter (HOSPITAL_COMMUNITY)
Admission: RE | Disposition: A | Discharge status: Home / Self Care | Payer: Self-pay | Source: Ambulatory Visit | Attending: Gastroenterology

## 2013-01-18 DIAGNOSIS — D128 Benign neoplasm of rectum: Secondary | ICD-10-CM | POA: Insufficient documentation

## 2013-01-18 DIAGNOSIS — D129 Benign neoplasm of anus and anal canal: Secondary | ICD-10-CM | POA: Insufficient documentation

## 2013-01-18 DIAGNOSIS — K644 Residual hemorrhoidal skin tags: Secondary | ICD-10-CM | POA: Insufficient documentation

## 2013-01-18 DIAGNOSIS — C3490 Malignant neoplasm of unspecified part of unspecified bronchus or lung: Secondary | ICD-10-CM

## 2013-01-18 HISTORY — DX: Acute myocardial infarction, unspecified: I21.9

## 2013-01-18 HISTORY — PX: FLEXIBLE SIGMOIDOSCOPY: SHX5431

## 2013-01-18 HISTORY — DX: Peripheral vascular disease, unspecified: I73.9

## 2013-01-18 HISTORY — PX: HOT HEMOSTASIS: SHX5433

## 2013-01-18 HISTORY — DX: Shortness of breath: R06.02

## 2013-01-18 HISTORY — DX: Sleep apnea, unspecified: G47.30

## 2013-01-18 SURGERY — SIGMOIDOSCOPY, FLEXIBLE
Anesthesia: Moderate Sedation

## 2013-01-18 MED ORDER — FENTANYL CITRATE 0.05 MG/ML IJ SOLN
INTRAMUSCULAR | Status: DC | PRN
  Administered 2013-01-18: 25 ug via INTRAVENOUS

## 2013-01-18 MED ORDER — SODIUM CHLORIDE 0.9 % IV SOLN
INTRAVENOUS | Status: DC
  Administered 2013-01-18: 500 mL via INTRAVENOUS

## 2013-01-18 MED ORDER — MIDAZOLAM HCL 10 MG/2ML IJ SOLN
INTRAMUSCULAR | Status: AC
  Filled 2013-01-18: qty 4

## 2013-01-18 MED ORDER — MIDAZOLAM HCL 10 MG/2ML IJ SOLN
INTRAMUSCULAR | Status: DC | PRN
  Administered 2013-01-18: 1 mg via INTRAVENOUS
  Administered 2013-01-18: 2 mg via INTRAVENOUS

## 2013-01-18 MED ORDER — DIPHENHYDRAMINE HCL 50 MG/ML IJ SOLN
INTRAMUSCULAR | Status: AC
  Filled 2013-01-18: qty 1

## 2013-01-18 MED ORDER — FENTANYL CITRATE 0.05 MG/ML IJ SOLN
INTRAMUSCULAR | Status: AC
  Filled 2013-01-18: qty 4

## 2013-01-18 MED ORDER — CLOPIDOGREL BISULFATE 75 MG PO TABS: 75.0000 mg | ORAL_TABLET | Freq: Every day | ORAL | Status: DC

## 2013-01-18 NOTE — Op Note (Signed)
[facility] [address] [place], [address]   FLEXIBLE SIGMOIDOSCOPY PROCEDURE REPORT  PATIENT: Sean Gardner, Sean Gardner  MR#: [id number] BIRTHDATE: 08-21-1956 , 56  yrs. old GENDER: Male ENDOSCOPIST: Willis Modena, MD REFERRED BY: Tally Joe, M.D. PROCEDURE DATE:  01/18/2013 PROCEDURE:   Sigmoidoscopy with biopsy ASA CLASS:   Class III INDICATIONS:reassessment of rectal polypectomy site (tubulovillous adenoma with high grade dysplasia). MEDICATIONS: Fentanyl 25 mcg IV and Versed 3 mg IV  DESCRIPTION OF PROCEDURE:   After the risks benefits and alternatives of the procedure were thoroughly explained, informed consent was obtained.  revealed external hemorrhoids. The endoscope was introduced through the anus  and advanced to the sigmoid colon , limited by No adverse events experienced.   The quality of the prep was good .  The instrument was then slowly withdrawn as the mucosa was fully examined.     FINDINGS:  Large external hemorrhoids, otherwise normal digital rectal exam.  Rectal polypectomy site with neighboring tattoo markings well visualized.  Very small very subtle nodularity around part of lateral rim of polypectomy site, suspect polypectomy reparative tissue, less likely polyp tissue.  This area was removed nevertheless with cold biopsy forceps.  Retroflexed view  of rectum normal.  Remainder of exam to mid sigmoid colon was normal. The scope was then withdrawn from the patient and the procedure terminated.  COMPLICATIONS:  There were no complications.  ENDOSCOPIC IMPRESSION: Seemingly complete resection of prior large rectal polyp.  Subtle small nodularity, likely reparative/scar tissue, removed with cold biopsy forceps.  RECOMMENDATIONS: 1.  Watch for potential complications of procedure. 2.  Await biopsy results. 3.  Repeat complete colonoscopy in 2 years. 4.  Resume clopidigrel tomorrow.  REPEAT EXAM: Colon repeat in 2  years  _______________________________ eSignedWillis Modena, MD 01/18/2013 8:06 AM CC:

## 2013-01-18 NOTE — H&P (Signed)
Patient interval history reviewed.  Patient examined again.  There has been no change from documented H/P dated 01/12/13 (scanned into chart from our office) except as documented above.  Assessment:  1.  Rectal polyp, prior piecemeal polypectomy, tubulovillous adenoma with high grade dysplasia.  Plan:  1.  Sigmoidoscopy with possible completion polypectomy, possible argon plasma coagulation (APC). 2.  Risks (bleeding, infection, bowel perforation that could require surgery, sedation-related changes in cardiopulmonary systems), benefits (identification and possible treatment of source of symptoms, exclusion of certain causes of symptoms), and alternatives (watchful waiting, radiographic imaging studies, empiric medical treatment) of flexible sigmoidoscopy with possible completion polypectomy, possible APC, were explained to patient in detail and he wishes to proceed.

## 2013-01-19 ENCOUNTER — Encounter (HOSPITAL_COMMUNITY): Payer: Self-pay | Admitting: Gastroenterology

## 2013-03-23 ENCOUNTER — Ambulatory Visit: Payer: 59 | Admitting: Internal Medicine

## 2013-10-17 ENCOUNTER — Other Ambulatory Visit: Payer: 59

## 2013-10-17 ENCOUNTER — Telehealth: Payer: Self-pay | Admitting: Internal Medicine

## 2013-10-17 NOTE — Telephone Encounter (Signed)
s.w. pt called to r/s cx appt due to being sick...done...pt aware

## 2013-10-18 ENCOUNTER — Ambulatory Visit: Payer: 59 | Admitting: Internal Medicine

## 2013-10-27 ENCOUNTER — Other Ambulatory Visit (HOSPITAL_BASED_OUTPATIENT_CLINIC_OR_DEPARTMENT_OTHER): Payer: 59

## 2013-10-27 ENCOUNTER — Encounter (HOSPITAL_COMMUNITY): Payer: Self-pay

## 2013-10-27 ENCOUNTER — Ambulatory Visit (HOSPITAL_COMMUNITY)
Admission: RE | Admit: 2013-10-27 | Discharge: 2013-10-27 | Disposition: A | Discharge status: Home / Self Care | Payer: 59 | Source: Ambulatory Visit | Attending: Internal Medicine | Admitting: Internal Medicine

## 2013-10-27 DIAGNOSIS — C3490 Malignant neoplasm of unspecified part of unspecified bronchus or lung: Secondary | ICD-10-CM

## 2013-10-27 DIAGNOSIS — I2510 Atherosclerotic heart disease of native coronary artery without angina pectoris: Secondary | ICD-10-CM | POA: Insufficient documentation

## 2013-10-27 DIAGNOSIS — J984 Other disorders of lung: Secondary | ICD-10-CM | POA: Insufficient documentation

## 2013-10-27 DIAGNOSIS — C7A090 Malignant carcinoid tumor of the bronchus and lung: Secondary | ICD-10-CM

## 2013-10-27 DIAGNOSIS — K7689 Other specified diseases of liver: Secondary | ICD-10-CM | POA: Insufficient documentation

## 2013-10-27 DIAGNOSIS — Z951 Presence of aortocoronary bypass graft: Secondary | ICD-10-CM | POA: Insufficient documentation

## 2013-10-27 DIAGNOSIS — M47814 Spondylosis without myelopathy or radiculopathy, thoracic region: Secondary | ICD-10-CM | POA: Insufficient documentation

## 2013-10-27 LAB — CBC WITH DIFFERENTIAL/PLATELET
BASO%: 0.6 % (ref 0.0–2.0)
Basophils Absolute: 0 10*3/uL (ref 0.0–0.1)
EOS%: 2 % (ref 0.0–7.0)
Eosinophils Absolute: 0.2 10*3/uL (ref 0.0–0.5)
HEMATOCRIT: 43.4 % (ref 38.4–49.9)
HEMOGLOBIN: 14.9 g/dL (ref 13.0–17.1)
LYMPH#: 1.6 10*3/uL (ref 0.9–3.3)
LYMPH%: 20 % (ref 14.0–49.0)
MCH: 33 pg (ref 27.2–33.4)
MCHC: 34.3 g/dL (ref 32.0–36.0)
MCV: 96 fL (ref 79.3–98.0)
MONO#: 0.6 10*3/uL (ref 0.1–0.9)
MONO%: 7.7 % (ref 0.0–14.0)
NEUT%: 69.7 % (ref 39.0–75.0)
NEUTROS ABS: 5.4 10*3/uL (ref 1.5–6.5)
Platelets: 238 10*3/uL (ref 140–400)
RBC: 4.52 10*6/uL (ref 4.20–5.82)
RDW: 14.2 % (ref 11.0–14.6)
WBC: 7.8 10*3/uL (ref 4.0–10.3)

## 2013-10-27 LAB — COMPREHENSIVE METABOLIC PANEL (CC13)
ALT: 94 U/L — AB (ref 0–55)
ANION GAP: 9 meq/L (ref 3–11)
AST: 51 U/L — ABNORMAL HIGH (ref 5–34)
Albumin: 3.7 g/dL (ref 3.5–5.0)
Alkaline Phosphatase: 57 U/L (ref 40–150)
BUN: 13.4 mg/dL (ref 7.0–26.0)
CALCIUM: 9.6 mg/dL (ref 8.4–10.4)
CHLORIDE: 109 meq/L (ref 98–109)
CO2: 23 meq/L (ref 22–29)
CREATININE: 0.8 mg/dL (ref 0.7–1.3)
Glucose: 93 mg/dl (ref 70–140)
Potassium: 4.4 mEq/L (ref 3.5–5.1)
Sodium: 140 mEq/L (ref 136–145)
TOTAL PROTEIN: 6.8 g/dL (ref 6.4–8.3)
Total Bilirubin: 0.69 mg/dL (ref 0.20–1.20)

## 2013-11-01 ENCOUNTER — Ambulatory Visit (HOSPITAL_BASED_OUTPATIENT_CLINIC_OR_DEPARTMENT_OTHER): Payer: 59 | Admitting: Internal Medicine

## 2013-11-01 ENCOUNTER — Encounter: Payer: Self-pay | Admitting: Internal Medicine

## 2013-11-01 VITALS — BP 151/68 | HR 63 | Temp 98.1°F | Resp 18 | Ht 72.0 in | Wt 304.0 lb

## 2013-11-01 DIAGNOSIS — C3490 Malignant neoplasm of unspecified part of unspecified bronchus or lung: Secondary | ICD-10-CM

## 2013-11-01 DIAGNOSIS — Z859 Personal history of malignant neoplasm, unspecified: Secondary | ICD-10-CM

## 2013-11-01 NOTE — Progress Notes (Signed)
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   PRIOR THERAPY:  1. Status post left lower lobectomy under the care of Dr. Roxy Manns on 01/29/2009. 2. Status post 4 cycles of adjuvant chemotherapy. First cycle was given with cisplatin and docetaxel, discontinued secondary to intolerance. The patient completed 3 more cycles with carboplatin and docetaxel. Last dose was given 05/27/2009.  CURRENT THERAPY: Observation.  INTERVAL HISTORY: Sean Gardner 58 y.o. male returns to the clinic today for routine six-month followup visit. The patient is feeling fine today with no specific complaints except for shortness of breath with exertion secondary to moderate obesity. He denied having any significant chest pain, cough or hemoptysis. The patient denied having any significant weight loss or night sweats. He had repeat CT scan of the chest performed recently and he is here for evaluation and discussion of his scan results.  MEDICAL HISTORY: Past Medical History  Diagnosis Date  . Hypertension   . Myocardial infarction   . Sleep apnea     cpap  . Shortness of breath   . Peripheral vascular disease   . Lung cancer     lung ca dx 2010    ALLERGIES:  is allergic to zolpidem tartrate.  MEDICATIONS:  Current Outpatient Prescriptions  Medication Sig Dispense Refill  . aspirin 81 MG tablet Take 81 mg by mouth daily.      Marland Kitchen atorvastatin (LIPITOR) 80 MG tablet Take 80 mg by mouth daily before breakfast.       . clopidogrel (PLAVIX) 75 MG tablet Take 1 tablet (75 mg total) by mouth daily before breakfast.  1 tablet  0  . gabapentin (NEURONTIN) 300 MG capsule Take 300 mg by mouth 3 (three) times daily.       . metoprolol (LOPRESSOR) 50 MG  tablet Take 50 mg by mouth 2 (two) times daily.      . ranitidine (ZANTAC) 300 MG capsule Take 300 mg by mouth 2 (two) times daily.      . rosuvastatin (CRESTOR) 40 MG tablet Take 40 mg by mouth daily.      . Vitamin D, Ergocalciferol, (DRISDOL) 50000 UNITS CAPS Take 1 capsule by mouth Daily.       No current facility-administered medications for this visit.    REVIEW OF SYSTEMS:  A comprehensive review of systems was negative except for: Respiratory: positive for dyspnea on exertion   PHYSICAL EXAMINATION: General appearance: alert, cooperative and no distress Head: Normocephalic, without obvious abnormality, atraumatic Neck: no adenopathy Resp: clear to auscultation bilaterally Cardio: regular rate and rhythm, S1, S2 normal, no murmur, click, rub or gallop GI: soft, non-tender; bowel sounds normal; no masses,  no organomegaly Extremities: extremities normal, atraumatic, no cyanosis or edema  ECOG PERFORMANCE STATUS: 1 - Symptomatic but completely ambulatory  Blood pressure 151/68, pulse 63, temperature 98.1 F (36.7 C), temperature source Oral, resp. rate 18, height 6' (1.829 m), weight 304 lb (137.893 kg).  LABORATORY DATA: Lab Results  Component Value Date   WBC 7.8 10/27/2013   HGB 14.9 10/27/2013   HCT 43.4 10/27/2013   MCV 96.0 10/27/2013   PLT 238 10/27/2013      Chemistry      Component Value Date/Time   NA 140  10/27/2013 0947   NA 142 03/21/2012 1556   NA 140 01/27/2012 0930   K 4.4 10/27/2013 0947   K 4.2 03/21/2012 1556   K 4.3 01/27/2012 0930   CL 110* 09/27/2012 1157   CL 104 03/21/2012 1556   CL 107 01/27/2012 0930   CO2 23 10/27/2013 0947   CO2 26 03/21/2012 1556   CO2 24 01/27/2012 0930   BUN 13.4 10/27/2013 0947   BUN 12 03/21/2012 1556   BUN 11 01/27/2012 0930   CREATININE 0.8 10/27/2013 0947   CREATININE 1.1 03/21/2012 1556   CREATININE 0.90 01/27/2012 0930      Component Value Date/Time   CALCIUM 9.6 10/27/2013 0947   CALCIUM 9.3 03/21/2012 1556   CALCIUM 9.6 01/27/2012 0930    ALKPHOS 57 10/27/2013 0947   ALKPHOS 59 03/21/2012 1556   ALKPHOS 54 01/27/2012 0930   AST 51* 10/27/2013 0947   AST 73* 03/21/2012 1556   AST 74* 01/27/2012 0930   ALT 94* 10/27/2013 0947   ALT 111* 03/21/2012 1556   ALT 125* 01/27/2012 0930   BILITOT 0.69 10/27/2013 0947   BILITOT 0.70 03/21/2012 1556   BILITOT 0.6 01/27/2012 0930       RADIOGRAPHIC STUDIES:  Ct Chest Wo Contrast  10/27/2013   CLINICAL DATA:  Lung cancer status post left lower lobectomy in 2010  EXAM: CT CHEST WITHOUT CONTRAST  TECHNIQUE: Multidetector CT imaging of the chest was performed following the standard protocol without IV contrast.  COMPARISON:  09/27/2012  FINDINGS: Status post left lower lobectomy. Mild linear scarring in the anterior right upper lobe. Mild lingular scarring. No new/suspicious pulmonary nodules. No pleural effusion or pneumothorax.  Visualized thyroid is grossly unremarkable.  The heart is normal in size. No pericardial effusion. Coronary atherosclerosis. Postsurgical changes related to prior CABG.  Small mediastinal nodes which do not meet pathologic CT size criteria. No suspicious axillary lymphadenopathy.  Visualized upper abdomen is notable for mild hepatic steatosis.  Degenerative changes of the visualized thoracolumbar spine.  IMPRESSION: Status post left lower lobectomy.  No evidence of recurrent or metastatic disease in the chest.   Electronically Signed   By: Julian Hy M.D.   On: 10/27/2013 10:42    ASSESSMENT AND PLAN : This is a very pleasant 58 years old white male with history of stage IIIa non-small cell lung cancer diagnosed in March of 2010 status post left lower lobectomy followed by adjuvant chemotherapy and the patient has been observation since August of 2010 with no evidence for disease recurrence. I discussed the scan results with the patient today. I recommended for him to continue on observation with repeat CT scan of the chest in one year.  He was advised to call me immediately if he  has any concerning symptoms in the interval.  All questions were answered. The patient knows to call the clinic with any problems, questions or concerns. We can certainly see the patient much sooner if necessary.

## 2013-11-01 NOTE — Patient Instructions (Signed)
Followup visit in one year with repeat CT scan of the chest. 

## 2013-11-30 ENCOUNTER — Other Ambulatory Visit: Payer: Self-pay | Admitting: Family Medicine

## 2013-11-30 DIAGNOSIS — M5416 Radiculopathy, lumbar region: Secondary | ICD-10-CM

## 2013-12-06 ENCOUNTER — Ambulatory Visit
Admission: RE | Admit: 2013-12-06 | Discharge: 2013-12-06 | Disposition: A | Discharge status: Home / Self Care | Payer: Non-veteran care | Source: Ambulatory Visit | Attending: Family Medicine | Admitting: Family Medicine

## 2013-12-06 DIAGNOSIS — M5416 Radiculopathy, lumbar region: Secondary | ICD-10-CM

## 2014-10-30 ENCOUNTER — Ambulatory Visit (HOSPITAL_COMMUNITY): Admission: RE | Admit: 2014-10-30 | Payer: 59 | Source: Ambulatory Visit

## 2014-10-30 ENCOUNTER — Other Ambulatory Visit: Payer: 59

## 2014-11-01 ENCOUNTER — Telehealth: Payer: Self-pay | Admitting: Internal Medicine

## 2014-11-01 ENCOUNTER — Ambulatory Visit: Payer: 59 | Admitting: Internal Medicine

## 2014-11-01 ENCOUNTER — Telehealth: Payer: Self-pay | Admitting: Medical Oncology

## 2014-11-01 NOTE — Telephone Encounter (Signed)
CT cancelled. I left a message for pt to call me. He has f/u today

## 2014-11-01 NOTE — Telephone Encounter (Signed)
pt called and was concerned about what his appts are for i fwd him to desk nurse

## 2014-11-01 NOTE — Telephone Encounter (Signed)
Missed appt . Pt did not get CT scan due to insurance. Pt asking how much longer he needs to do CT scan and see Mohamed. . Per Julien Nordmann I told pt that since he was diagnosed in 2010 that he can now follow up with his PCP and does not need to see Julien Nordmann unless he has concerns to please call us. . Pt voices understanding.

## 2015-07-22 DIAGNOSIS — R0602 Shortness of breath: Secondary | ICD-10-CM | POA: Diagnosis not present

## 2015-07-22 DIAGNOSIS — R350 Frequency of micturition: Secondary | ICD-10-CM | POA: Diagnosis not present

## 2015-07-22 DIAGNOSIS — Z125 Encounter for screening for malignant neoplasm of prostate: Secondary | ICD-10-CM | POA: Diagnosis not present

## 2015-07-22 DIAGNOSIS — E559 Vitamin D deficiency, unspecified: Secondary | ICD-10-CM | POA: Diagnosis not present

## 2015-07-22 DIAGNOSIS — J449 Chronic obstructive pulmonary disease, unspecified: Secondary | ICD-10-CM | POA: Diagnosis not present

## 2015-07-22 DIAGNOSIS — E782 Mixed hyperlipidemia: Secondary | ICD-10-CM | POA: Diagnosis not present

## 2015-07-22 DIAGNOSIS — Z1211 Encounter for screening for malignant neoplasm of colon: Secondary | ICD-10-CM | POA: Diagnosis not present

## 2015-07-22 DIAGNOSIS — Z1389 Encounter for screening for other disorder: Secondary | ICD-10-CM | POA: Diagnosis not present

## 2015-07-22 DIAGNOSIS — I10 Essential (primary) hypertension: Secondary | ICD-10-CM | POA: Diagnosis not present

## 2015-07-22 DIAGNOSIS — Z0000 Encounter for general adult medical examination without abnormal findings: Secondary | ICD-10-CM | POA: Diagnosis not present

## 2015-07-22 DIAGNOSIS — I2510 Atherosclerotic heart disease of native coronary artery without angina pectoris: Secondary | ICD-10-CM | POA: Diagnosis not present

## 2015-07-22 DIAGNOSIS — Z23 Encounter for immunization: Secondary | ICD-10-CM | POA: Diagnosis not present

## 2015-07-31 ENCOUNTER — Institutional Professional Consult (permissible substitution): Payer: Non-veteran care | Admitting: Pulmonary Disease

## 2015-09-05 DIAGNOSIS — I70213 Atherosclerosis of native arteries of extremities with intermittent claudication, bilateral legs: Secondary | ICD-10-CM | POA: Diagnosis not present

## 2015-09-05 DIAGNOSIS — I25810 Atherosclerosis of coronary artery bypass graft(s) without angina pectoris: Secondary | ICD-10-CM | POA: Diagnosis not present

## 2015-09-05 DIAGNOSIS — E782 Mixed hyperlipidemia: Secondary | ICD-10-CM | POA: Diagnosis not present

## 2015-09-05 DIAGNOSIS — I2510 Atherosclerotic heart disease of native coronary artery without angina pectoris: Secondary | ICD-10-CM | POA: Diagnosis not present

## 2015-09-17 ENCOUNTER — Telehealth: Payer: Self-pay | Admitting: Internal Medicine

## 2015-09-17 NOTE — Telephone Encounter (Signed)
Patient having a lot of shortness of breath.  Patient has sleep apnea, he cant catch his breath when he lays down.  He loses his breath when he sits in a chair.  Patient is disabled veteran, goes to [place] [facility].  Patient has had several breathing tests and never got results from the breathing tests.  Has cough and sob.  Cannot walk very far, going outside causes issues with breathing. Pt has hx of lung cancer and COPD.    Scheduled patient to see Dr. Vaughan Browner tomorrow at 2:30pm. Nothing further needed. Closing encounter

## 2015-09-18 ENCOUNTER — Institutional Professional Consult (permissible substitution): Payer: Non-veteran care | Admitting: Pulmonary Disease

## 2015-09-18 ENCOUNTER — Encounter: Payer: Self-pay | Admitting: Pulmonary Disease

## 2015-09-18 ENCOUNTER — Ambulatory Visit (INDEPENDENT_AMBULATORY_CARE_PROVIDER_SITE_OTHER): Payer: 59 | Admitting: Pulmonary Disease

## 2015-09-18 VITALS — BP 118/78 | HR 63 | Temp 97.9°F | Ht 73.0 in | Wt 301.2 lb

## 2015-09-18 DIAGNOSIS — R0600 Dyspnea, unspecified: Secondary | ICD-10-CM | POA: Diagnosis not present

## 2015-09-18 DIAGNOSIS — R0689 Other abnormalities of breathing: Secondary | ICD-10-CM | POA: Diagnosis not present

## 2015-09-18 DIAGNOSIS — G4730 Sleep apnea, unspecified: Secondary | ICD-10-CM | POA: Insufficient documentation

## 2015-09-18 MED ORDER — TIOTROPIUM BROMIDE MONOHYDRATE 18 MCG IN CAPS: 18.0000 ug | ORAL_CAPSULE | Freq: Every day | RESPIRATORY_TRACT | Status: DC

## 2015-09-18 NOTE — Progress Notes (Signed)
Subjective:    Patient ID: Sean Gardner, male    DOB: [birth year], 59 y.o.   MRN: [id number]  HPI Consult for evaluation of dyspnea.  Sean Gardner is a ex smoker with history of sleep apnea, left lower lobectomy for lung cancer who presents for evaluation of dyspnea. He had this dyspnea ever since his diagnosis of cancer and surgery in 2010. He was seen by Dr. Melvyn Novas in 2014 and had PFTs at that time which showed restriction and DLCO reduction but no obstruction. He also has significant coronary artery disease with quadruple bypass in 2002. He follows up with Dr. Einar Gip regularly. At his last visit earlier this year he was told that his heart is working normally. His oncologist is Dr. Inda Merlin. He has regular follow-up since his lung cancer resection with no evidence of recurrence.  His main symptoms are dyspnea that is more when he lies on his back. He also has non-productive cough that is exacerbated by lying on his back. He does have dyspnea on exertion as well. He does not have dyspnea at rest. He has cough with yellow mucus production, no wheezing, fevers or chills. He is a veteran who gets most of his care at the [place]. He's had regular PFTs there the last one being in May of this year. He was on Symbicort and Spiriva this year but Spiriva ran out and he is now taking the Symbicort alone.  He also has OSA diagnosed in 2002. He is using his CPAP every night. But noticed more recently that it does not seem to be as effective as before. He reports disturbed sleep, snoring, daytime somnolence.  He is a heavy ex-smoker. He smoked 2 packs per day for 20 years. He quit in 2010 when he was diagnosed with lung cancer. He has problems with his weight and obesity. He's been trying to lose weight. He's lost 15 pounds over the past 4 months with diet and exercise.  Past Medical History  Diagnosis Date  . Hypertension   . Myocardial infarction ([place])   . Sleep apnea     cpap  . Shortness of breath   .  Peripheral vascular disease ([place])   . Lung cancer ([place])     lung ca dx 2010    Current outpatient prescriptions:  .  aspirin 81 MG tablet, Take 81 mg by mouth daily., Disp: , Rfl:  .  atorvastatin (LIPITOR) 80 MG tablet, Take 80 mg by mouth daily before breakfast. , Disp: , Rfl:  .  budesonide-formoterol (SYMBICORT) 80-4.5 MCG/ACT inhaler, Inhale 2 puffs into the lungs 2 (two) times daily., Disp: , Rfl:  .  clopidogrel (PLAVIX) 75 MG tablet, Take 1 tablet (75 mg total) by mouth daily before breakfast., Disp: 1 tablet, Rfl: 0 .  gabapentin (NEURONTIN) 300 MG capsule, Take 300 mg by mouth 3 (three) times daily. , Disp: , Rfl:  .  metoprolol (LOPRESSOR) 50 MG tablet, Take 50 mg by mouth 2 (two) times daily., Disp: , Rfl:  .  ranitidine (ZANTAC) 300 MG capsule, Take 300 mg by mouth 2 (two) times daily., Disp: , Rfl:  .  rosuvastatin (CRESTOR) 40 MG tablet, Take 40 mg by mouth daily., Disp: , Rfl:  .  Vitamin D, Ergocalciferol, (DRISDOL) 50000 UNITS CAPS, Take 1 capsule by mouth Daily., Disp: , Rfl:  .  tiotropium (SPIRIVA HANDIHALER) 18 MCG inhalation capsule, Place 1 capsule (18 mcg total) into inhaler and inhale daily., Disp: 30 capsule, Rfl: 4  Review of Systems Dyspnea on exertion and lying on the back, cough with white mucus, no wheezing. No hemoptysis. No fevers, chills, loss of weight, appetite, malaise, fatigue. No chest pain, palpitations. Nausea, vomiting, diarrhea, constipation. All other review of systems are negative    Objective:   Physical Exam  Blood pressure 118/78, pulse 63, temperature 97.9 F (36.6 C), temperature source Oral, height '6\' 1"'$  (1.854 m), weight 301 lb 3.2 oz (136.623 kg), SpO2 96 %.  Gen: Obese male. No apparent distress Neuro: No gross focal deficits. Neck: No JVD, lymphadenopathy, thyromegaly. RS: Clear, no wheeze, crackles CVS: S1-S2 heard, no murmurs rubs gallops. Abdomen: Soft, positive bowel sounds. Extremities: No edema.     Assessment &  Plan:  Dyspnea on exertion He may have emphysema from smoking although his PFTs do not show obstruction and CTs scan did not show emphysematous changes. He likely has dyspnea from obesity, deconditioning. I will try to get his recent PFTs and imaging from the [place]. Start on Spiriva in addition to the Symbicort.  Obstructive sleep apnea He was diagnosed in 2002 and is been using the same machine since then. It does not seem to be working well and I believe some of his symptoms of dyspnea and cough especially on lying down may be too poorly treated sleep apnea. [facility].  Plan: - Obtain PFTs and lung imaging from [place], continue Symbicort. - Agree with sleep apnea reassessment and CPAP replacement - Increase to lose weight and exercise  Marshell Garfinkel MD [facility] Pager [phone] If no answer or after 3pm call: [phone] 09/18/2015, 5:45 PM

## 2015-09-18 NOTE — Patient Instructions (Signed)
We will start you on Spiriva. Continue the Symbicort We will try to get your most recent pulmonary function tests from the [place] Please continue to exercise, diet and try to lose weight. Please follow up with his sleep doctor at the [facility] for check of his CPAP.  Return to clinic in 6 months

## 2015-10-07 ENCOUNTER — Ambulatory Visit: Payer: Non-veteran care | Admitting: Internal Medicine

## 2016-03-17 ENCOUNTER — Encounter: Payer: Self-pay | Admitting: Internal Medicine

## 2016-03-17 ENCOUNTER — Ambulatory Visit (INDEPENDENT_AMBULATORY_CARE_PROVIDER_SITE_OTHER): Payer: 59 | Admitting: Internal Medicine

## 2016-03-17 VITALS — BP 128/72 | HR 93 | Ht 73.0 in | Wt 302.0 lb

## 2016-03-17 DIAGNOSIS — R0600 Dyspnea, unspecified: Secondary | ICD-10-CM | POA: Diagnosis not present

## 2016-03-17 DIAGNOSIS — G4730 Sleep apnea, unspecified: Secondary | ICD-10-CM

## 2016-03-17 DIAGNOSIS — I10 Essential (primary) hypertension: Secondary | ICD-10-CM

## 2016-03-17 NOTE — Patient Instructions (Addendum)
Try off spiriva - think of it like high octane fuel and see if you note any change at peak levels of exertion and if not leave it off  Follow up here is as needed as there are two pulmonary doctors in [place] = [place]/[facility] rec trial off acei

## 2016-03-17 NOTE — Progress Notes (Signed)
Subjective:    Patient ID: Sean Gardner, male    DOB: 1956/09/25,    MRN: [id number]  HPI Consult for evaluation of dyspnea.  Mr. Schueler is a ex smoker with history of sleep apnea, left lower lobectomy for lung cancer who presents for evaluation of dyspnea. He had this dyspnea ever since his diagnosis of cancer and surgery in 2010. He was seen by Dr. Melvyn Novas in 2014 and had PFTs at that time which showed restriction and DLCO reduction but no obstruction. He also has significant coronary artery disease with quadruple bypass in 2002. He follows up with Dr. Einar Gip regularly. At his last visit earlier this year he was told that his heart is working normally. His oncologist is Dr. Inda Merlin. He has regular follow-up since his lung cancer resection with no evidence of recurrence.  His main symptoms are dyspnea that is more when he lies on his back. He also has non-productive cough that is exacerbated by lying on his back. He does have dyspnea on exertion as well. He does not have dyspnea at rest. He has cough with yellow mucus production, no wheezing, fevers or chills. He is a veteran who gets most of his care at the [place]. He's had regular PFTs there the last one being in May of this year. He was on Symbicort and Spiriva this year but Spiriva ran out and he is now taking the Symbicort alone.  He also has OSA diagnosed in 2002. He is using his CPAP every night. But noticed more recently that it does not seem to be as effective as before. He reports disturbed sleep, snoring, daytime somnolence.  He is a heavy ex-smoker. He smoked 2 packs per day for 20 years. He quit in 2010 when he was diagnosed with lung cancer. He has problems with his weight and obesity. He's been trying to lose weight. He's lost 15 pounds over the past 4 months with diet and exercise.  rec We will start you on Spiriva. Continue the Symbicort    03/17/2016  f/u ov/Amely Voorheis re: obesity / ? Copd on spiriva dpi/ symb 59 2bid  Chief Complaint    Patient presents with  . Follow-up    Pt c/o continued SOB regardless of exertion level. Pt is on CPAP nightly. Pt denies cough/wheeze/CP/tightness.   any time lies down immediately [facility]  More limited by hips and back than sob, can do large store even [employer] but not mall no better with spiriva MMRC2 = can't walk a nl pace on a flat grade s sob but does fine slow and flat    No obvious day to day or daytime variability or assoc excess/ purulent sputum or mucus plugs or hemoptysis or cp or chest tightness, subjective wheeze or overt sinus or hb symptoms. No unusual exp hx or h/o childhood pna/ asthma or knowledge of premature birth.  Sleeping ok without nocturnal  or early am exacerbation  of respiratory  c/o's or need for noct saba. Also denies any obvious fluctuation of symptoms with weather or environmental changes or other aggravating or alleviating factors except as outlined above   Current Medications, Allergies, Complete Past Medical History, Past Surgical History, Family History, and Social History were reviewed in [employer] record.  ROS  The following are not active complaints unless bolded sore throat, dysphagia, dental problems, itching, sneezing,  nasal congestion or excess/ purulent secretions, ear ache,   fever, chills, sweats, unintended wt loss, classically pleuritic  or exertional cp,  orthopnea pnd or leg swelling, presyncope, palpitations, abdominal pain, anorexia, nausea, vomiting, diarrhea  or change in bowel or bladder habits, change in stools or urine, dysuria,hematuria,  rash, arthralgias, visual complaints, headache, numbness, weakness or ataxia or problems with walking or coordination,  change in mood/affect or memory.                Objective:   Physical Exam   Gen obese pleasant wm nad  Wt Readings from Last 3 Encounters:  03/17/16 302 lb (136.986 kg)  09/18/15 301 lb 3.2 oz (136.623 kg)  11/01/13 304 lb (137.893  kg)    Vital signs reviewed     oropharynx:  Modified Mallampati Score = 1/2  Neuro: No gross focal deficits. Neck: No JVD, lymphadenopathy, thyromegaly RS: Clear, no wheeze, crackles CVS: S1-S2 heard, no murmurs rubs gallops. Abdomen: Soft, positive bowel sounds. Extremities: No edema.        Assessment & Plan:

## 2016-03-21 ENCOUNTER — Encounter: Payer: Self-pay | Admitting: Internal Medicine

## 2016-03-21 NOTE — Assessment & Plan Note (Signed)
Body mass index is 39.85   No results found for: TSH   Contributing to gerd tendency/ doe/reviewed the need and the process to achieve and maintain neg calorie balance > defer f/u primary care including intermittently monitoring thyroid status

## 2016-03-21 NOTE — Assessment & Plan Note (Addendum)
-  11/25/2012  Walked RA x 3 laps @ 185 ft each stopped due to end of study, no sob or desat - PFT's 11/25/2012 FEV1  2.54 (73%) ratio 75 and no change p B2,  DLCO 59 corrects to 109 - trial of spiriva added to symbicort 09/18/15 > no changed 03/17/2016 so rec resume just the symbicort    - 03/17/2016  After extensive coaching HFA effectiveness =    75%    When respiratory symptoms begin or become refractory well after a patient reports complete smoking cessation,  Especially when this wasn't the case while they were smoking, a red flag is raised based on the work of Dr Kris Mouton which states:  if you quit smoking when your best day FEV1 is still well preserved it is highly unlikely you will progress to severe disease.  That is to say, once the smoking stops,  the symptoms should not suddenly erupt or markedly worsen.  If so, the differential diagnosis should include  obesity/deconditioning,  LPR/Reflux/Aspiration syndromes,  occult CHF, or  especially side effect of medications commonly used in this population (especially ACEi - see HBP)   As expected no resp to rx for copd so ok to try back off spiriva and just use the low dose spiriva with issue of risk of mild AB/gerd related to wt   I had an extended discussion with the patient reviewing all relevant studies completed to date and  lasting 15 to 20 minutes of a 25 minute visit    Each maintenance medication was reviewed in detail including most importantly the difference between maintenance and prns and under what circumstances the prns are to be triggered using an action plan format that is not reflected in the computer generated alphabetically organized AVS.    Please see instructions for details which were reviewed in writing and the patient given a copy highlighting the part that I personally wrote and discussed at today's ov.

## 2016-03-21 NOTE — Assessment & Plan Note (Signed)
[facility] where has his PC

## 2016-03-21 NOTE — Assessment & Plan Note (Addendum)
Trial off acei started 10/11/2012  And again rec 03/17/2016   In the best review of chronic cough to date ( NEJM 2016 375 [id number]) ,  ACEi are now felt to cause cough in up to  20% of pts which is a 4 fold increase from previous reports and does not include the variety of non-specific complaints we see in pulmonary clinic in pts on ACEi but previously attributed to another dx like  Copd/asthma and  include PNDS, throat and chest congestion, "bronchitis", unexplained dyspnea and noct "strangling" sensations, and hoarseness, but also  atypical /refractory GERD symptoms like dysphagia and "bad heartburn"   The only way I know  to prove this is not an "ACEi Case" is a trial off ACEi x a minimum of 6 weeks then regroup.   Will advise he check with PC but note the last time we rec this his breathing improved in absence of cough

## 2016-03-23 ENCOUNTER — Telehealth: Payer: Self-pay | Admitting: *Deleted

## 2016-03-23 NOTE — Telephone Encounter (Signed)
-----   Message from Tanda Rockers, MD sent at 03/22/2016  6:26 AM EDT ----- Can't remember if this one got by me or not but he needs trial off acei immediately - he can go to primary or we can call him in avapro 300 mg daily x 4 week supply and have him return to see Tammy or his PC after 4 weeks for recheck

## 2016-03-23 NOTE — Telephone Encounter (Signed)
LMTCB for the pt 

## 2016-03-27 NOTE — Telephone Encounter (Signed)
LMTCB

## 2016-03-30 NOTE — Telephone Encounter (Signed)
lmtcb for pt.  

## 2016-03-30 NOTE — Telephone Encounter (Signed)
[phone] pt calling back

## 2016-03-31 MED ORDER — IRBESARTAN 300 MG PO TABS: 300.0000 mg | ORAL_TABLET | Freq: Every day | ORAL | Status: DC

## 2016-03-31 NOTE — Telephone Encounter (Signed)
Spoke with the pt and notified of recs per MW  He verbalized understanding  Avapro was sent and ov with MW to f/u 04/30/16

## 2016-04-27 ENCOUNTER — Telehealth: Payer: Self-pay | Admitting: Internal Medicine

## 2016-04-27 NOTE — Telephone Encounter (Signed)
Per 03/17/16 OV: Patient Instructions       Try off spiriva - think of it like high octane fuel and see if you note any change at peak levels of exertion and if not leave it off  Follow up here is as needed as there are two pulmonary doctors in [place] = [place] rec trial off acei    ---  Called spoke with pt. He was wanting to know which BP medication he is suppose to come off of. I advised him he was suppose to go off lisinopril. He verbalized understanding and needed nothing further needed

## 2016-04-30 ENCOUNTER — Ambulatory Visit: Payer: Non-veteran care | Admitting: Internal Medicine

## 2016-05-11 ENCOUNTER — Encounter (HOSPITAL_COMMUNITY): Payer: Self-pay | Admitting: Emergency Medicine

## 2016-05-11 ENCOUNTER — Emergency Department (HOSPITAL_COMMUNITY): Payer: 59

## 2016-05-11 ENCOUNTER — Inpatient Hospital Stay (HOSPITAL_COMMUNITY)
Admission: EM | Admit: 2016-05-11 | Discharge: 2016-05-15 | DRG: 872 | Disposition: A | Discharge status: Home / Self Care | Payer: 59 | Attending: Internal Medicine | Admitting: Internal Medicine

## 2016-05-11 DIAGNOSIS — G4730 Sleep apnea, unspecified: Secondary | ICD-10-CM | POA: Diagnosis present

## 2016-05-11 DIAGNOSIS — Z6841 Body Mass Index (BMI) 40.0 and over, adult: Secondary | ICD-10-CM

## 2016-05-11 DIAGNOSIS — B9620 Unspecified Escherichia coli [E. coli] as the cause of diseases classified elsewhere: Secondary | ICD-10-CM | POA: Diagnosis present

## 2016-05-11 DIAGNOSIS — E785 Hyperlipidemia, unspecified: Secondary | ICD-10-CM | POA: Diagnosis present

## 2016-05-11 DIAGNOSIS — I739 Peripheral vascular disease, unspecified: Secondary | ICD-10-CM | POA: Diagnosis present

## 2016-05-11 DIAGNOSIS — N390 Urinary tract infection, site not specified: Secondary | ICD-10-CM | POA: Diagnosis present

## 2016-05-11 DIAGNOSIS — Z85118 Personal history of other malignant neoplasm of bronchus and lung: Secondary | ICD-10-CM

## 2016-05-11 DIAGNOSIS — I10 Essential (primary) hypertension: Secondary | ICD-10-CM | POA: Diagnosis present

## 2016-05-11 DIAGNOSIS — I252 Old myocardial infarction: Secondary | ICD-10-CM | POA: Diagnosis not present

## 2016-05-11 DIAGNOSIS — A419 Sepsis, unspecified organism: Secondary | ICD-10-CM | POA: Diagnosis not present

## 2016-05-11 DIAGNOSIS — Z87891 Personal history of nicotine dependence: Secondary | ICD-10-CM

## 2016-05-11 DIAGNOSIS — Z951 Presence of aortocoronary bypass graft: Secondary | ICD-10-CM

## 2016-05-11 DIAGNOSIS — Z7982 Long term (current) use of aspirin: Secondary | ICD-10-CM | POA: Diagnosis not present

## 2016-05-11 DIAGNOSIS — Z7902 Long term (current) use of antithrombotics/antiplatelets: Secondary | ICD-10-CM | POA: Diagnosis not present

## 2016-05-11 DIAGNOSIS — E662 Morbid (severe) obesity with alveolar hypoventilation: Secondary | ICD-10-CM | POA: Diagnosis present

## 2016-05-11 DIAGNOSIS — E872 Acidosis, unspecified: Secondary | ICD-10-CM

## 2016-05-11 DIAGNOSIS — N12 Tubulo-interstitial nephritis, not specified as acute or chronic: Secondary | ICD-10-CM | POA: Diagnosis not present

## 2016-05-11 DIAGNOSIS — I2510 Atherosclerotic heart disease of native coronary artery without angina pectoris: Secondary | ICD-10-CM | POA: Diagnosis present

## 2016-05-11 DIAGNOSIS — R0602 Shortness of breath: Secondary | ICD-10-CM | POA: Diagnosis not present

## 2016-05-11 LAB — COMPREHENSIVE METABOLIC PANEL
ALK PHOS: 50 U/L (ref 38–126)
ALT: 44 U/L (ref 17–63)
AST: 33 U/L (ref 15–41)
Albumin: 4 g/dL (ref 3.5–5.0)
Anion gap: 9 (ref 5–15)
BILIRUBIN TOTAL: 1.5 mg/dL — AB (ref 0.3–1.2)
BUN: 10 mg/dL (ref 6–20)
CALCIUM: 9.4 mg/dL (ref 8.9–10.3)
CO2: 21 mmol/L — AB (ref 22–32)
CREATININE: 1.1 mg/dL (ref 0.61–1.24)
Chloride: 107 mmol/L (ref 101–111)
GFR calc non Af Amer: >60 mL/min (ref >60)
Glucose, Bld: 109 mg/dL — ABNORMAL HIGH (ref 65–99)
Potassium: 3.6 mmol/L (ref 3.5–5.1)
SODIUM: 137 mmol/L (ref 135–145)
Total Protein: 6.6 g/dL (ref 6.5–8.1)

## 2016-05-11 LAB — URINALYSIS, ROUTINE W REFLEX MICROSCOPIC
Glucose, UA: NEGATIVE mg/dL
Ketones, ur: 15 mg/dL — AB
NITRITE: NEGATIVE
Protein, ur: 100 mg/dL — AB
Specific Gravity, Urine: 1.028 (ref 1.005–1.030)
pH: 5.5 (ref 5.0–8.0)

## 2016-05-11 LAB — CBC WITH DIFFERENTIAL/PLATELET
BASOS PCT: 0 %
Basophils Absolute: 0 10*3/uL (ref 0.0–0.1)
EOS ABS: 0 10*3/uL (ref 0.0–0.7)
Eosinophils Relative: 0 %
HCT: 43.7 % (ref 39.0–52.0)
HEMOGLOBIN: 14.6 g/dL (ref 13.0–17.0)
Lymphocytes Relative: 3 %
Lymphs Abs: 0.5 10*3/uL — ABNORMAL LOW (ref 0.7–4.0)
MCH: 32.2 pg (ref 26.0–34.0)
MCHC: 33.4 g/dL (ref 30.0–36.0)
MCV: 96.3 fL (ref 78.0–100.0)
MONO ABS: 0.5 10*3/uL (ref 0.1–1.0)
MONOS PCT: 3 %
NEUTROS PCT: 94 %
Neutro Abs: 13.3 10*3/uL — ABNORMAL HIGH (ref 1.7–7.7)
Platelets: 190 10*3/uL (ref 150–400)
RBC: 4.54 MIL/uL (ref 4.22–5.81)
RDW: 14.6 % (ref 11.5–15.5)
WBC: 14.3 10*3/uL — ABNORMAL HIGH (ref 4.0–10.5)

## 2016-05-11 LAB — URINE MICROSCOPIC-ADD ON

## 2016-05-11 LAB — I-STAT CG4 LACTIC ACID, ED
LACTIC ACID, VENOUS: 1.95 mmol/L — AB (ref 0.5–1.9)
Lactic Acid, Venous: 2.73 mmol/L (ref 0.5–1.9)

## 2016-05-11 LAB — I-STAT TROPONIN, ED: TROPONIN I, POC: 0.05 ng/mL (ref 0.00–0.08)

## 2016-05-11 MED ORDER — ACETAMINOPHEN 325 MG PO TABS
650.0000 mg | ORAL_TABLET | Freq: Four times a day (QID) | ORAL | Status: DC | PRN
  Administered 2016-05-12 – 2016-05-13 (×3): 650 mg via ORAL
  Filled 2016-05-11 (×3): qty 2

## 2016-05-11 MED ORDER — ROSUVASTATIN CALCIUM 20 MG PO TABS
40.0000 mg | ORAL_TABLET | Freq: Every day | ORAL | Status: DC
  Administered 2016-05-11 – 2016-05-15 (×5): 40 mg via ORAL
  Filled 2016-05-11 (×5): qty 2

## 2016-05-11 MED ORDER — CLOPIDOGREL BISULFATE 75 MG PO TABS
75.0000 mg | ORAL_TABLET | Freq: Every day | ORAL | Status: DC
  Administered 2016-05-12 – 2016-05-15 (×4): 75 mg via ORAL
  Filled 2016-05-11 (×4): qty 1

## 2016-05-11 MED ORDER — ENOXAPARIN SODIUM 40 MG/0.4ML ~~LOC~~ SOLN
40.0000 mg | SUBCUTANEOUS | Status: DC
  Administered 2016-05-11 – 2016-05-14 (×4): 40 mg via SUBCUTANEOUS
  Filled 2016-05-11 (×4): qty 0.4

## 2016-05-11 MED ORDER — FAMOTIDINE 20 MG PO TABS
40.0000 mg | ORAL_TABLET | Freq: Two times a day (BID) | ORAL | Status: DC
  Administered 2016-05-11 – 2016-05-15 (×8): 40 mg via ORAL
  Filled 2016-05-11 (×8): qty 2

## 2016-05-11 MED ORDER — ASPIRIN EC 81 MG PO TBEC
81.0000 mg | DELAYED_RELEASE_TABLET | Freq: Every day | ORAL | Status: DC
  Administered 2016-05-12 – 2016-05-15 (×4): 81 mg via ORAL
  Filled 2016-05-11 (×4): qty 1

## 2016-05-11 MED ORDER — ONDANSETRON HCL 4 MG/2ML IJ SOLN
INTRAMUSCULAR | Status: AC
  Administered 2016-05-11: 4 mg
  Filled 2016-05-11: qty 2

## 2016-05-11 MED ORDER — SODIUM CHLORIDE 0.9 % IV BOLUS (SEPSIS)
1000.0000 mL | Freq: Once | INTRAVENOUS | Status: AC
  Administered 2016-05-11: 1000 mL via INTRAVENOUS

## 2016-05-11 MED ORDER — DEXTROSE 5 % IV SOLN
1.0000 g | Freq: Two times a day (BID) | INTRAVENOUS | Status: DC
  Administered 2016-05-12: 1 g via INTRAVENOUS
  Filled 2016-05-11: qty 10

## 2016-05-11 MED ORDER — VITAMIN D (ERGOCALCIFEROL) 1.25 MG (50000 UNIT) PO CAPS
50000.0000 [IU] | ORAL_CAPSULE | ORAL | Status: DC
  Administered 2016-05-12: 50000 [IU] via ORAL
  Filled 2016-05-11: qty 1

## 2016-05-11 MED ORDER — DEXTROSE 5 % IV SOLN
1.0000 g | Freq: Two times a day (BID) | INTRAVENOUS | Status: DC
  Filled 2016-05-11 (×2): qty 10

## 2016-05-11 MED ORDER — SODIUM CHLORIDE 0.9% FLUSH
3.0000 mL | Freq: Two times a day (BID) | INTRAVENOUS | Status: DC
  Administered 2016-05-11 – 2016-05-15 (×4): 3 mL via INTRAVENOUS

## 2016-05-11 MED ORDER — SODIUM CHLORIDE 0.9 % IV SOLN
INTRAVENOUS | Status: DC
Start: 2016-05-12 — End: 2016-05-12

## 2016-05-11 MED ORDER — VITAMIN D (ERGOCALCIFEROL) 1.25 MG (50000 UNIT) PO CAPS
50000.0000 [IU] | ORAL_CAPSULE | ORAL | Status: DC
  Filled 2016-05-11: qty 1

## 2016-05-11 MED ORDER — ACETAMINOPHEN 325 MG PO TABS
650.0000 mg | ORAL_TABLET | Freq: Once | ORAL | Status: AC
  Administered 2016-05-11: 650 mg via ORAL
  Filled 2016-05-11: qty 2

## 2016-05-11 MED ORDER — METOPROLOL TARTRATE 50 MG PO TABS
50.0000 mg | ORAL_TABLET | Freq: Two times a day (BID) | ORAL | Status: DC
  Administered 2016-05-11 – 2016-05-15 (×8): 50 mg via ORAL
  Filled 2016-05-11 (×8): qty 1

## 2016-05-11 MED ORDER — POTASSIUM CHLORIDE IN NACL 20-0.9 MEQ/L-% IV SOLN
INTRAVENOUS | Status: DC
  Administered 2016-05-11 – 2016-05-12 (×2): via INTRAVENOUS
  Filled 2016-05-11 (×3): qty 1000

## 2016-05-11 MED ORDER — GABAPENTIN 300 MG PO CAPS
300.0000 mg | ORAL_CAPSULE | Freq: Every morning | ORAL | Status: DC
  Administered 2016-05-12 – 2016-05-15 (×4): 300 mg via ORAL
  Filled 2016-05-11 (×4): qty 1

## 2016-05-11 MED ORDER — MOMETASONE FURO-FORMOTEROL FUM 200-5 MCG/ACT IN AERO
2.0000 | INHALATION_SPRAY | Freq: Two times a day (BID) | RESPIRATORY_TRACT | Status: DC
  Administered 2016-05-11 – 2016-05-15 (×8): 2 via RESPIRATORY_TRACT
  Filled 2016-05-11 (×2): qty 8.8

## 2016-05-11 MED ORDER — DEXTROSE 5 % IV SOLN
1.0000 g | Freq: Once | INTRAVENOUS | Status: AC
  Administered 2016-05-11: 1 g via INTRAVENOUS
  Filled 2016-05-11: qty 10

## 2016-05-11 MED ORDER — ONDANSETRON HCL 4 MG/2ML IJ SOLN: 4.0000 mg | Freq: Four times a day (QID) | INTRAMUSCULAR | Status: DC | PRN

## 2016-05-11 MED ORDER — ONDANSETRON HCL 4 MG PO TABS: 4.0000 mg | ORAL_TABLET | Freq: Four times a day (QID) | ORAL | Status: DC | PRN

## 2016-05-11 NOTE — ED Notes (Signed)
Report given to [facility] RN 

## 2016-05-11 NOTE — ED Notes (Signed)
Attempted to start IV x 2-- without success, IV team consult placed.

## 2016-05-11 NOTE — ED Notes (Signed)
Placed patient into a gown and on the monitor

## 2016-05-11 NOTE — ED Notes (Signed)
Pt pulled out iv upon arrival to er, restarted and labs drawn at that time, wife and daughter in room

## 2016-05-11 NOTE — Progress Notes (Signed)
RT placed patient on CPAP auto 59mx and 671m. NO O2 bleed in needed. Patient tolerating well.

## 2016-05-11 NOTE — ED Provider Notes (Signed)
[facility] DEPT Provider Note   CSN: [id number] Arrival date & time: 05/11/16  1419  First Provider Contact:       History   Chief Complaint Chief Complaint  Patient presents with  . Dysuria  . Emesis  . Fever    HPI Sean Gardner is a 60 y.o. male.  Pt reports he woke today with a fever and inability to control his urine.  Pt reports he has vomitted several times.    The history is provided by the patient. No language interpreter was used.  Dysuria   This is a new problem. The current episode started 6 to 12 hours ago. The problem occurs every urination. The problem has not changed since onset.The patient is experiencing no pain. The maximum temperature recorded prior to his arrival was 101 to 101.9 F. Associated symptoms include vomiting. He has tried nothing for the symptoms. His past medical history does not include recurrent UTIs.  Emesis   This is a new problem. The current episode started 12 to 24 hours ago. The problem has been gradually improving. The maximum temperature recorded prior to his arrival was 101 to 101.9 F. Associated symptoms include a fever.  Fever   Associated symptoms include vomiting.    Past Medical History:  Diagnosis Date  . Hypertension   . Lung cancer ([place])    lung ca dx 2010  . Myocardial infarction ([place])   . Peripheral vascular disease ([place])   . Shortness of breath   . Sleep apnea    cpap    Patient Active Problem List   Diagnosis Date Noted  . Morbid (severe) obesity due to excess calories ([place]) 03/21/2016  . Sleep apnea 09/18/2015  . Dyspnea and respiratory abnormality 09/18/2015  . Dyspnea 10/11/2012  . Essential hypertension 10/11/2012  . Lung cancer ([place]) 03/23/2012    Past Surgical History:  Procedure Laterality Date  . ACHILLES TENDON SURGERY    . CORONARY ARTERY BYPASS GRAFT    . FLEXIBLE SIGMOIDOSCOPY N/A 01/18/2013   Procedure: FLEXIBLE SIGMOIDOSCOPY;  Surgeon: Arta Silence, MD;  Location: [facility] ENDOSCOPY;   Service: Endoscopy;  Laterality: N/A;  . HERNIA REPAIR     inguinal  . HOT HEMOSTASIS N/A 01/18/2013   Procedure: HOT HEMOSTASIS (ARGON PLASMA COAGULATION/BICAP);  Surgeon: Arta Silence, MD;  Location: [facility];  Service: Endoscopy;  Laterality: N/A;  . LUNG SURGERY         Home Medications    Prior to Admission medications   Medication Sig Start Date End Date Taking? Authorizing Provider  aspirin 81 MG tablet Take 81 mg by mouth daily.   Yes Historical Provider, MD  clopidogrel (PLAVIX) 75 MG tablet Take 1 tablet (75 mg total) by mouth daily before breakfast. 01/19/13  Yes Arta Silence, MD  gabapentin (NEURONTIN) 300 MG capsule Take 300 mg by mouth every morning.    Yes Historical Provider, MD  irbesartan (AVAPRO) 300 MG tablet Take 1 tablet (300 mg total) by mouth daily. 03/31/16  Yes Tanda Rockers, MD  lisinopril (PRINIVIL,ZESTRIL) 40 MG tablet Take 1 tablet by mouth daily. 02/06/16  Yes Historical Provider, MD  metoprolol (LOPRESSOR) 50 MG tablet Take 50 mg by mouth 2 (two) times daily.   Yes Historical Provider, MD  ranitidine (ZANTAC) 300 MG tablet Take 1 tablet by mouth 2 (two) times daily. 02/26/16  Yes Historical Provider, MD  rosuvastatin (CRESTOR) 40 MG tablet Take 40 mg by mouth daily.   Yes Historical Provider, MD  SYMBICORT 160-4.5  MCG/ACT inhaler Inhale 2 puffs into the lungs 2 (two) times daily. 02/07/16  Yes Historical Provider, MD  Vitamin D, Ergocalciferol, (DRISDOL) 50000 UNITS CAPS Take 1 capsule by mouth 2 (two) times a week.  07/18/12  Yes Historical Provider, MD    Family History No family history on file.  Social History Social History  Substance Use Topics  . Smoking status: Former Smoker    Packs/day: 2.00    Years: 20.00    Types: Cigarettes    Quit date: 10/20/2007  . Smokeless tobacco: Never Used  . Alcohol use Yes     Comment: Occassionaly     Allergies   Zolpidem tartrate   Review of Systems Review of Systems  Constitutional: Positive  for fever.  Gastrointestinal: Positive for vomiting.  Genitourinary: Positive for dysuria.  All other systems reviewed and are negative.    Physical Exam Updated Vital Signs BP 119/78 (BP Location: Left Arm)   Pulse 94   Temp 101 F (38.3 C) (Oral)   Resp (!) 27   SpO2 95%   Physical Exam  Constitutional: He is oriented to person, place, and time. He appears well-developed and well-nourished.  HENT:  Head: Normocephalic.  Eyes: EOM are normal. Pupils are equal, round, and reactive to light.  Neck: Normal range of motion.  Cardiovascular: Normal rate and regular rhythm.   Pulmonary/Chest: Effort normal and breath sounds normal.  Abdominal: Soft. He exhibits no distension.  Musculoskeletal: Normal range of motion.  Neurological: He is alert and oriented to person, place, and time.  Skin: Skin is warm.  Psychiatric: He has a normal mood and affect.  Nursing note and vitals reviewed.    ED Treatments / Results  Labs (all labs ordered are listed, but only abnormal results are displayed) Labs Reviewed  COMPREHENSIVE METABOLIC PANEL - Abnormal; Notable for the following:       Result Value   CO2 21 (*)    Glucose, Bld 109 (*)    Total Bilirubin 1.5 (*)    All other components within normal limits  CBC WITH DIFFERENTIAL/PLATELET - Abnormal; Notable for the following:    WBC 14.3 (*)    Neutro Abs 13.3 (*)    Lymphs Abs 0.5 (*)    All other components within normal limits  URINALYSIS, ROUTINE W REFLEX MICROSCOPIC (NOT AT [facility]) - Abnormal; Notable for the following:    Color, Urine AMBER (*)    APPearance HAZY (*)    Hgb urine dipstick MODERATE (*)    Bilirubin Urine SMALL (*)    Ketones, ur 15 (*)    Protein, ur 100 (*)    Leukocytes, UA MODERATE (*)    All other components within normal limits  URINE MICROSCOPIC-ADD ON - Abnormal; Notable for the following:    Squamous Epithelial / LPF 0-5 (*)    Bacteria, UA FEW (*)    All other components within normal limits    I-STAT CG4 LACTIC ACID, ED - Abnormal; Notable for the following:    Lactic Acid, Venous 2.73 (*)    All other components within normal limits  I-STAT CG4 LACTIC ACID, ED - Abnormal; Notable for the following:    Lactic Acid, Venous 1.95 (*)    All other components within normal limits  CULTURE, BLOOD (ROUTINE X 2)  CULTURE, BLOOD (ROUTINE X 2)  URINE CULTURE  I-STAT TROPOININ, ED    EKG  EKG Interpretation None       Radiology Dg Chest 2 View  Result Date: 05/11/2016 CLINICAL DATA:  One day history of shortness of breath EXAM: CHEST  2 VIEW COMPARISON:  Chest CT October 27, 2013 FINDINGS: There is no edema or consolidation. The heart is slightly enlarged with pulmonary vascularity within normal limits. There is epicardial fat along the right and left sides the heart. Patient is status post coronary artery bypass grafting. No adenopathy. There is degenerative change in the thoracic spine. IMPRESSION: No edema or consolidation.  Slight cardiomegaly. Electronically Signed   By: Lowella Grip III M.D.   On: 05/11/2016 16:07   Procedures Procedures (including critical care time)  Medications Ordered in ED Medications  sodium chloride 0.9 % bolus 1,000 mL (1,000 mLs Intravenous New Bag/Given 05/11/16 1748)  ondansetron (ZOFRAN) 4 MG/2ML injection (4 mg  Given 05/11/16 1508)  cefTRIAXone (ROCEPHIN) 1 g in dextrose 5 % 50 mL IVPB (1 g Intravenous New Bag/Given 05/11/16 1539)  acetaminophen (TYLENOL) tablet 650 mg (650 mg Oral Given 05/11/16 1748)     Initial Impression / Assessment and Plan / ED Course  I have reviewed the triage vital signs and the nursing notes.  Pertinent labs & imaging results that were available during my care of the patient were reviewed by me and considered in my medical decision making (see chart for details).  Clinical Course  Pt given IV rocephin,  Pt has slight elevation of lactate,   Urine shows wbc's tntc. Elevated WBC's.   Pt had multiple attempts  to start Iv,  Pt has difficult acess due to history of chemo.    Final Clinical Impressions(s) / ED Diagnoses   Final diagnoses:  Pyelonephritis  UTI (lower urinary tract infection)    New Prescriptions New Prescriptions   No medications on file     Fransico Meadow, PA-C 05/11/16 [place], PA-C 05/11/16 1953    Duffy Bruce, MD 05/12/16 0900

## 2016-05-11 NOTE — ED Triage Notes (Signed)
Pt states woke up with urinary discomfort and then states he was sob and then had some vomiting, strong smell to ua, cbg 4 pt has hx of CBAG and lunng cancer

## 2016-05-11 NOTE — H&P (Signed)
History and Physical    Sean Gardner WGN:[id number] DOB: Feb 17, 1956 DOA: 05/11/2016  PCP: Gara Kroner, MD   Patient coming from: Home.  Chief Complaint: Dysuria, fever and vomiting  HPI: Sean Gardner is a 60 y.o. male with medical history significant of hypertension, lung cancer, CAD/MI, peripheral vascular disease, sleep apnea on CPAP, morbid obesity, hyperlipidemia who comes in the emergency department with complaints of dysuria, fever, chills since about 3 AM today.  Per patient, he woke up around 3 AM today sweating, febrile with chills. He does states that he has been having urgency and dysuria, but often has been unable to make it to the bathroom without being incontinent. He denies flank pain or urethral discharge. He denies abdominal pain, but complains of frequent nausea. He has vomited about 4-5 times today during the late morning and early afternoon.   He denies chest pain, palpitations, dizziness, orthopnea, pedal edema the lower extremities, but complains of having an episode of dyspnea around 11:30 AM when he started having severe symptoms of nausea and emesis. He denies further dyspnea since then.  ED Course: Workup in the ER shows leukocytosis of 14.3 K, elevated lactic acid level at 2.73 with an abnormal urine analysis with pyuria and bacteriuria. He was given IV fluids and IV antibiotics.  Review of Systems: As per HPI otherwise 10 point review of systems negative.   Past Medical History:  Diagnosis Date  . Hypertension   . Lung cancer ([place])    lung ca dx 2010  . Myocardial infarction ([place])   . Peripheral vascular disease ([place])   . Shortness of breath   . Sleep apnea    cpap    Past Surgical History:  Procedure Laterality Date  . ACHILLES TENDON SURGERY    . CORONARY ARTERY BYPASS GRAFT    . FLEXIBLE SIGMOIDOSCOPY N/A 01/18/2013   Procedure: FLEXIBLE SIGMOIDOSCOPY;  Surgeon: Arta Silence, MD;  Location: [facility];  Service: Endoscopy;  Laterality: N/A;    . HERNIA REPAIR     inguinal  . HOT HEMOSTASIS N/A 01/18/2013   Procedure: HOT HEMOSTASIS (ARGON PLASMA COAGULATION/BICAP);  Surgeon: Arta Silence, MD;  Location: [facility];  Service: Endoscopy;  Laterality: N/A;  . LUNG SURGERY       reports that he quit smoking about 8 years ago. His smoking use included Cigarettes. He has a 40.00 pack-year smoking history. He has never used smokeless tobacco. He reports that he drinks alcohol. He reports that he does not use drugs.  Allergies  Allergen Reactions  . Zolpidem Tartrate Other (See Comments)    Felt fuzzy and hallucinations    Family History  Problem Relation Age of Onset  . Lung cancer Father     Prior to Admission medications   Medication Sig Start Date End Date Taking? Authorizing Provider  aspirin 81 MG tablet Take 81 mg by mouth daily.   Yes Historical Provider, MD  clopidogrel (PLAVIX) 75 MG tablet Take 1 tablet (75 mg total) by mouth daily before breakfast. 01/19/13  Yes Arta Silence, MD  gabapentin (NEURONTIN) 300 MG capsule Take 300 mg by mouth every morning.    Yes Historical Provider, MD  irbesartan (AVAPRO) 300 MG tablet Take 1 tablet (300 mg total) by mouth daily. 03/31/16  Yes Tanda Rockers, MD  lisinopril (PRINIVIL,ZESTRIL) 40 MG tablet Take 1 tablet by mouth daily. 02/06/16  Yes Historical Provider, MD  metoprolol (LOPRESSOR) 50 MG tablet Take 50 mg by mouth 2 (two) times daily.  Yes Historical Provider, MD  ranitidine (ZANTAC) 300 MG tablet Take 1 tablet by mouth 2 (two) times daily. 02/26/16  Yes Historical Provider, MD  rosuvastatin (CRESTOR) 40 MG tablet Take 40 mg by mouth daily.   Yes Historical Provider, MD  SYMBICORT 160-4.5 MCG/ACT inhaler Inhale 2 puffs into the lungs 2 (two) times daily. 02/07/16  Yes Historical Provider, MD  Vitamin D, Ergocalciferol, (DRISDOL) 50000 UNITS CAPS Take 1 capsule by mouth 2 (two) times a week.  07/18/12  Yes Historical Provider, MD    Physical Exam: Vitals:   05/11/16 1845  05/11/16 1915 05/11/16 1930 05/11/16 2035  BP: 125/65 114/56 122/72 130/68  Pulse: 91 88 86 86  Resp: (!) '27 16 18 20  '$ Temp:    97.9 F (36.6 C)  TempSrc:      SpO2: 94% 96% 95% 96%      Constitutional: NAD, calm, comfortable Vitals:   05/11/16 1845 05/11/16 1915 05/11/16 1930 05/11/16 2035  BP: 125/65 114/56 122/72 130/68  Pulse: 91 88 86 86  Resp: (!) '27 16 18 20  '$ Temp:    97.9 F (36.6 C)  TempSrc:      SpO2: 94% 96% 95% 96%   Eyes: PERRL, lids and conjunctivae normal ENMT: Mucous membranes are moist. Posterior pharynx clear of any exudate or lesions. Neck: normal, supple, no masses, no thyromegaly Respiratory: clear to auscultation bilaterally, no wheezing, no crackles. Normal respiratory effort.  Cardiovascular: Regular rate and rhythm, no murmurs / rubs / gallops. No extremity edema. 2+ pedal pulses. No carotid bruits.  Abdomen: Obese, BS positive, soft, no tenderness, no masses palpated. No flank tenderness.   Musculoskeletal: no clubbing / cyanosis. No joint deformity upper and lower extremities. Good ROM, no contractures. Normal muscle tone.  Skin: no rashes, lesions, ulcers.  Neurologic: CN 2-12 grossly intact. Sensation intact, DTR normal. Strength 5/5 in all 4.  Psychiatric: Normal judgment and insight. Alert and oriented x 4. Normal mood.    Labs on Admission: I have personally reviewed following labs and imaging studies  CBC:  Recent Labs Lab 05/11/16 1451  WBC 14.3*  NEUTROABS 13.3*  HGB 14.6  HCT 43.7  MCV 96.3  PLT 809   Basic Metabolic Panel:  Recent Labs Lab 05/11/16 1451  NA 137  K 3.6  CL 107  CO2 21*  GLUCOSE 109*  BUN 10  CREATININE 1.10  CALCIUM 9.4   GFR: CrCl cannot be calculated (Unknown ideal weight.). Liver Function Tests:  Recent Labs Lab 05/11/16 1451  AST 33  ALT 44  ALKPHOS 50  BILITOT 1.5*  PROT 6.6  ALBUMIN 4.0   Urine analysis:    Component Value Date/Time   COLORURINE AMBER (A) 05/11/2016 1515    APPEARANCEUR HAZY (A) 05/11/2016 1515   LABSPEC 1.028 05/11/2016 1515   PHURINE 5.5 05/11/2016 1515   GLUCOSEU NEGATIVE 05/11/2016 1515   HGBUR MODERATE (A) 05/11/2016 1515   BILIRUBINUR SMALL (A) 05/11/2016 1515   KETONESUR 15 (A) 05/11/2016 1515   PROTEINUR 100 (A) 05/11/2016 1515   UROBILINOGEN 1.0 01/28/2009 1145   NITRITE NEGATIVE 05/11/2016 1515   LEUKOCYTESUR MODERATE (A) 05/11/2016 1515    Radiological Exams on Admission: Dg Chest 2 View  Result Date: 05/11/2016 CLINICAL DATA:  One day history of shortness of breath EXAM: CHEST  2 VIEW COMPARISON:  Chest CT October 27, 2013 FINDINGS: There is no edema or consolidation. The heart is slightly enlarged with pulmonary vascularity within normal limits. There is epicardial fat along the  right and left sides the heart. Patient is status post coronary artery bypass grafting. No adenopathy. There is degenerative change in the thoracic spine. IMPRESSION: No edema or consolidation.  Slight cardiomegaly. Electronically Signed   By: Lowella Grip III M.D.   On: 05/11/2016 16:07   EKG: Independently reviewed. Vent. rate 115 BPM PR interval * ms QRS duration 89 ms QT/QTc 311/431 ms P-R-T axes 19 163 70. Sinus tachycardia Atrial premature complexes Consider right ventricular hypertrophy Abnormal lateral Q waves Non-specific TW flattening lateral leads  Assessment/Plan Principal Problem:   Sepsis secondary to UTI ([place]) Admit to telemetry/inpatient. Continue IV fluids. Continue Rocephin 1 g IVPB 12 hours. Follow-up blood cultures and sensitivity. Follow-up urine culture and sensitivity.  Active Problems:   Essential hypertension Hold Avapro and lisinopril for now. Continue metoprolol 50 mg by mouth twice a day. Monitor blood pressure closely.    Sleep apnea Continue CPAP at bedtime.    CAD (coronary artery disease) Stable and not symptomatic. Continue aspirin, clopidogrel, beta blocker and statin.     Hyperlipidemia Continue Crestor 40 mg by mouth daily. Monitor LFTs periodically.   DVT prophylaxis: Lovenox. Code Status: Full code. Family Communication:  Disposition Plan: Admit for IV antibiotic therapy for 2-3 days. Consults called:  Admission status: Telemetry/inpatient.   Reubin Milan MD [facility] Pager [phone].  If 7PM-7AM, please contact night-coverage [email or web address] Password [facility]  05/11/2016, 9:17 PM

## 2016-05-12 LAB — CBC WITH DIFFERENTIAL/PLATELET
BASOS ABS: 0 10*3/uL (ref 0.0–0.1)
BASOS PCT: 0 %
EOS ABS: 0 10*3/uL (ref 0.0–0.7)
EOS PCT: 0 %
HEMATOCRIT: 38.2 % — AB (ref 39.0–52.0)
Hemoglobin: 12.6 g/dL — ABNORMAL LOW (ref 13.0–17.0)
Lymphocytes Relative: 5 %
Lymphs Abs: 0.5 10*3/uL — ABNORMAL LOW (ref 0.7–4.0)
MCH: 32 pg (ref 26.0–34.0)
MCHC: 33 g/dL (ref 30.0–36.0)
MCV: 97 fL (ref 78.0–100.0)
MONO ABS: 0.6 10*3/uL (ref 0.1–1.0)
MONOS PCT: 6 %
Neutro Abs: 8.9 10*3/uL — ABNORMAL HIGH (ref 1.7–7.7)
Neutrophils Relative %: 89 %
PLATELETS: 154 10*3/uL (ref 150–400)
RBC: 3.94 MIL/uL — ABNORMAL LOW (ref 4.22–5.81)
RDW: 15.1 % (ref 11.5–15.5)
WBC: 10.1 10*3/uL (ref 4.0–10.5)

## 2016-05-12 LAB — COMPREHENSIVE METABOLIC PANEL
ALBUMIN: 3.1 g/dL — AB (ref 3.5–5.0)
ALK PHOS: 40 U/L (ref 38–126)
ALT: 30 U/L (ref 17–63)
ANION GAP: 5 (ref 5–15)
AST: 23 U/L (ref 15–41)
BILIRUBIN TOTAL: 1.1 mg/dL (ref 0.3–1.2)
BUN: 10 mg/dL (ref 6–20)
CALCIUM: 8.5 mg/dL — AB (ref 8.9–10.3)
CO2: 23 mmol/L (ref 22–32)
CREATININE: 0.97 mg/dL (ref 0.61–1.24)
Chloride: 110 mmol/L (ref 101–111)
GFR calc Af Amer: >60 mL/min (ref >60)
GFR calc non Af Amer: >60 mL/min (ref >60)
GLUCOSE: 97 mg/dL (ref 65–99)
Potassium: 3.9 mmol/L (ref 3.5–5.1)
SODIUM: 138 mmol/L (ref 135–145)
TOTAL PROTEIN: 5.3 g/dL — AB (ref 6.5–8.1)

## 2016-05-12 LAB — BLOOD CULTURE ID PANEL (REFLEXED)
Acinetobacter baumannii: NOT DETECTED
CANDIDA GLABRATA: NOT DETECTED
CANDIDA KRUSEI: NOT DETECTED
CANDIDA PARAPSILOSIS: NOT DETECTED
CARBAPENEM RESISTANCE: NOT DETECTED
Candida albicans: NOT DETECTED
Candida tropicalis: NOT DETECTED
ENTEROBACTERIACEAE SPECIES: DETECTED — AB
ESCHERICHIA COLI: DETECTED — AB
Enterobacter cloacae complex: NOT DETECTED
Enterococcus species: NOT DETECTED
Haemophilus influenzae: NOT DETECTED
KLEBSIELLA OXYTOCA: NOT DETECTED
KLEBSIELLA PNEUMONIAE: NOT DETECTED
Listeria monocytogenes: NOT DETECTED
Methicillin resistance: NOT DETECTED
Neisseria meningitidis: NOT DETECTED
PSEUDOMONAS AERUGINOSA: NOT DETECTED
Proteus species: NOT DETECTED
STAPHYLOCOCCUS SPECIES: NOT DETECTED
STREPTOCOCCUS AGALACTIAE: NOT DETECTED
STREPTOCOCCUS PNEUMONIAE: NOT DETECTED
STREPTOCOCCUS SPECIES: NOT DETECTED
Serratia marcescens: NOT DETECTED
Staphylococcus aureus (BCID): NOT DETECTED
Streptococcus pyogenes: NOT DETECTED
Vancomycin resistance: NOT DETECTED

## 2016-05-12 MED ORDER — NAPHAZOLINE-GLYCERIN 0.012-0.2 % OP SOLN
1.0000 [drp] | Freq: Four times a day (QID) | OPHTHALMIC | Status: DC | PRN
  Filled 2016-05-12: qty 15

## 2016-05-12 MED ORDER — DEXTROSE 5 % IV SOLN: 2.0000 g | Freq: Two times a day (BID) | INTRAVENOUS | Status: DC

## 2016-05-12 MED ORDER — DEXTROSE 5 % IV SOLN
2.0000 g | INTRAVENOUS | Status: DC
  Administered 2016-05-13 – 2016-05-15 (×3): 2 g via INTRAVENOUS
  Filled 2016-05-12 (×4): qty 2

## 2016-05-12 MED ORDER — DEXTROSE 5 % IV SOLN
1.0000 g | Freq: Once | INTRAVENOUS | Status: AC
  Administered 2016-05-12: 1 g via INTRAVENOUS
  Filled 2016-05-12: qty 10

## 2016-05-12 MED ORDER — SODIUM CHLORIDE 0.9 % IV SOLN
INTRAVENOUS | Status: DC
  Administered 2016-05-12 – 2016-05-14 (×5): via INTRAVENOUS

## 2016-05-12 MED ORDER — TRAZODONE HCL 50 MG PO TABS
50.0000 mg | ORAL_TABLET | Freq: Once | ORAL | Status: DC
  Filled 2016-05-12: qty 1

## 2016-05-12 MED ORDER — TAMSULOSIN HCL 0.4 MG PO CAPS
0.4000 mg | ORAL_CAPSULE | Freq: Every day | ORAL | Status: DC
  Administered 2016-05-12 – 2016-05-14 (×3): 0.4 mg via ORAL
  Filled 2016-05-12 (×3): qty 1

## 2016-05-12 NOTE — Progress Notes (Addendum)
Patient complaining of his eyes "burning" and feeling dry. Patient asking for visine eye drops. MD paged. Verbal order received.

## 2016-05-12 NOTE — Progress Notes (Signed)
PHARMACY - PHYSICIAN COMMUNICATION CRITICAL VALUE ALERT - BLOOD CULTURE IDENTIFICATION (BCID)  Results for orders placed or performed during the hospital encounter of 05/11/16  Blood Culture ID Panel (Reflexed) (Collected: 05/11/2016  3:25 PM)  Result Value Ref Range   Enterococcus species NOT DETECTED NOT DETECTED   Vancomycin resistance NOT DETECTED NOT DETECTED   Listeria monocytogenes NOT DETECTED NOT DETECTED   Staphylococcus species NOT DETECTED NOT DETECTED   Staphylococcus aureus NOT DETECTED NOT DETECTED   Methicillin resistance NOT DETECTED NOT DETECTED   Streptococcus species NOT DETECTED NOT DETECTED   Streptococcus agalactiae NOT DETECTED NOT DETECTED   Streptococcus pneumoniae NOT DETECTED NOT DETECTED   Streptococcus pyogenes NOT DETECTED NOT DETECTED   Acinetobacter baumannii NOT DETECTED NOT DETECTED   Enterobacteriaceae species DETECTED (A) NOT DETECTED   Enterobacter cloacae complex NOT DETECTED NOT DETECTED   Escherichia coli DETECTED (A) NOT DETECTED   Klebsiella oxytoca NOT DETECTED NOT DETECTED   Klebsiella pneumoniae NOT DETECTED NOT DETECTED   Proteus species NOT DETECTED NOT DETECTED   Serratia marcescens NOT DETECTED NOT DETECTED   Carbapenem resistance NOT DETECTED NOT DETECTED   Haemophilus influenzae NOT DETECTED NOT DETECTED   Neisseria meningitidis NOT DETECTED NOT DETECTED   Pseudomonas aeruginosa NOT DETECTED NOT DETECTED   Candida albicans NOT DETECTED NOT DETECTED   Candida glabrata NOT DETECTED NOT DETECTED   Candida krusei NOT DETECTED NOT DETECTED   Candida parapsilosis NOT DETECTED NOT DETECTED   Candida tropicalis NOT DETECTED NOT DETECTED    Name of physician (or Provider) Contacted: Dr. Tyrell Antonio  Changes to prescribed antibiotics required: Pt is on Rocephin currently    Gracy Bruins, PharmD Clinical Pharmacist [facility]

## 2016-05-12 NOTE — Progress Notes (Addendum)
At around 0220 pt. c/o on &off  Difficulty voiding & incontinent at times.MD on call was called & ordered to insert foley.

## 2016-05-12 NOTE — Progress Notes (Signed)
PROGRESS NOTE    Sean Gardner  QIW:[id number] DOB: [birth year] DOA: 05/11/2016 PCP: Gara Kroner, MD  Brief Narrative: Sean Gardner is a 60 y.o. male with medical history significant of hypertension, lung cancer, CAD/MI, peripheral vascular disease, sleep apnea on CPAP, morbid obesity, hyperlipidemia who comes in the emergency department with complaints of dysuria, fever, chills since about 3 AM 7-24.  Per patient, he woke up around 3 AM 7-24 sweating, febrile with chills. He does states that he has been having urgency and dysuria, but often has been unable to make it to the bathroom without being incontinent. He denies flank pain or urethral discharge. He denies abdominal pain, but complains of frequent nausea. He has vomited about 4-5 times today during the late morning and early afternoon.   Assessment & Plan:   Principal Problem:   Sepsis secondary to UTI [facility]) Active Problems:   Essential hypertension   Sleep apnea   CAD (coronary artery disease)   Hyperlipidemia     Sepsis secondary to UTI ([place]), E coli Bacteremia;  Continue IV fluids. Continue Rocephin 2 g IVPB 24 hours. blood cultures and sensitivity. Growing E coli.  Follow-up urine culture and sensitivity.  Urine retention;  He will need voiding trial.  Start Flomax.     Essential hypertension Hold Avapro and lisinopril for now. Continue metoprolol 50 mg by mouth twice a day. Monitor blood pressure closely.    Sleep apnea Continue CPAP at bedtime.    CAD (coronary artery disease) Stable and not symptomatic. Continue aspirin, clopidogrel, beta blocker and statin.    Hyperlipidemia Continue Crestor 40 mg by mouth daily. Monitor LFTs periodically.       DVT prophylaxis: (Lovenox/Heparin/SCD's/anticoagulated/None (if comfort care) Code Status: Full code.  Family Communication: Wife at bedside.  Disposition Plan: Home in 48 to 72 hours.    Consultants:    none  Procedures:  none  Antimicrobials:   Ceftriaxone 7-24   Subjective: Relates chills. Denies dyspnea.  He was having trouble emptying his bladder.   Objective: Vitals:   05/11/16 2304 05/12/16 0518 05/12/16 0849 05/12/16 0903  BP:  127/60  (!) 131/59  Pulse: 81 85  73  Resp: 18 18    Temp:  100.3 F (37.9 C)    TempSrc:  Oral    SpO2: 96% 95% 96%   Weight:      Height:        Intake/Output Summary (Last 24 hours) at 05/12/16 1012 Last data filed at 05/12/16 0908  Gross per 24 hour  Intake          2866.25 ml  Output              825 ml  Net          2041.25 ml   Filed Weights   05/11/16 2120  Weight: 134.4 kg (296 lb 4.8 oz)    Examination:  General exam: Appears calm and comfortable  Respiratory system: Clear to auscultation. Respiratory effort normal. Cardiovascular system: S1 & S2 heard, RRR. No JVD, murmurs, rubs, gallops or clicks. No pedal edema. Gastrointestinal system: Abdomen is nondistended, soft and nontender. No organomegaly or masses felt. Normal bowel sounds heard. Central nervous system: Alert and oriented. No focal neurological deficits. Extremities: Symmetric 5 x 5 power. Skin: No rashes, lesions or ulcers Psychiatry: Judgement and insight appear normal. Mood & affect appropriate.     Data Reviewed: I have personally reviewed following labs and imaging studies  CBC:  Recent Labs  Lab 05/11/16 1451 05/12/16 0751  WBC 14.3* 10.1  NEUTROABS 13.3* 8.9*  HGB 14.6 12.6*  HCT 43.7 38.2*  MCV 96.3 97.0  PLT 190 161   Basic Metabolic Panel:  Recent Labs Lab 05/11/16 1451 05/12/16 0751  NA 137 138  K 3.6 3.9  CL 107 110  CO2 21* 23  GLUCOSE 109* 97  BUN 10 10  CREATININE 1.10 0.97  CALCIUM 9.4 8.5*   GFR: Estimated Creatinine Clearance: 112 mL/min (by C-G formula based on SCr of 0.97 mg/dL). Liver Function Tests:  Recent Labs Lab 05/11/16 1451 05/12/16 0751  AST 33 23  ALT 44 30  ALKPHOS 50 40  BILITOT 1.5*  1.1  PROT 6.6 5.3*  ALBUMIN 4.0 3.1*   No results for input(s): LIPASE, AMYLASE in the last 168 hours. No results for input(s): AMMONIA in the last 168 hours. Coagulation Profile: No results for input(s): INR, PROTIME in the last 168 hours. Cardiac Enzymes: No results for input(s): CKTOTAL, CKMB, CKMBINDEX, TROPONINI in the last 168 hours. BNP (last 3 results) No results for input(s): PROBNP in the last 8760 hours. HbA1C: No results for input(s): HGBA1C in the last 72 hours. CBG: No results for input(s): GLUCAP in the last 168 hours. Lipid Profile: No results for input(s): CHOL, HDL, LDLCALC, TRIG, CHOLHDL, LDLDIRECT in the last 72 hours. Thyroid Function Tests: No results for input(s): TSH, T4TOTAL, FREET4, T3FREE, THYROIDAB in the last 72 hours. Anemia Panel: No results for input(s): VITAMINB12, FOLATE, FERRITIN, TIBC, IRON, RETICCTPCT in the last 72 hours. Sepsis Labs:  Recent Labs Lab 05/11/16 1505 05/11/16 1752  LATICACIDVEN 2.73* 1.95*    Recent Results (from the past 240 hour(s))  Blood Culture (routine x 2)     Status: None (Preliminary result)   Collection Time: 05/11/16  3:25 PM  Result Value Ref Range Status   Specimen Description BLOOD RIGHT HAND  Final   Special Requests BOTTLES DRAWN AEROBIC AND ANAEROBIC 5CC  Final   Culture  Setup Time   Final    GRAM NEGATIVE RODS IN BOTH AEROBIC AND ANAEROBIC BOTTLES Organism ID to follow CRITICAL RESULT CALLED TO, READ BACK BY AND VERIFIED WITH: K HIATT,PHARMD '@0715'$  05/12/16 MKELLY    Culture PENDING  Incomplete   Report Status PENDING  Incomplete  Blood Culture ID Panel (Reflexed)     Status: Abnormal   Collection Time: 05/11/16  3:25 PM  Result Value Ref Range Status   Enterococcus species NOT DETECTED NOT DETECTED Final   Vancomycin resistance NOT DETECTED NOT DETECTED Final   Listeria monocytogenes NOT DETECTED NOT DETECTED Final   Staphylococcus species NOT DETECTED NOT DETECTED Final   Staphylococcus aureus  NOT DETECTED NOT DETECTED Final   Methicillin resistance NOT DETECTED NOT DETECTED Final   Streptococcus species NOT DETECTED NOT DETECTED Final   Streptococcus agalactiae NOT DETECTED NOT DETECTED Final   Streptococcus pneumoniae NOT DETECTED NOT DETECTED Final   Streptococcus pyogenes NOT DETECTED NOT DETECTED Final   Acinetobacter baumannii NOT DETECTED NOT DETECTED Final   Enterobacteriaceae species DETECTED (A) NOT DETECTED Final    Comment: CRITICAL RESULT CALLED TO, READ BACK BY AND VERIFIED WITH: K HIATT,PHARMD '@0715'$  05/12/16 MKELLY    Enterobacter cloacae complex NOT DETECTED NOT DETECTED Final   Escherichia coli DETECTED (A) NOT DETECTED Final    Comment: CRITICAL RESULT CALLED TO, READ BACK BY AND VERIFIED WITH: K HIATT,PHARMD '@0715'$  05/12/16 MKELLY    Klebsiella oxytoca NOT DETECTED NOT DETECTED Final   Klebsiella pneumoniae NOT  DETECTED NOT DETECTED Final   Proteus species NOT DETECTED NOT DETECTED Final   Serratia marcescens NOT DETECTED NOT DETECTED Final   Carbapenem resistance NOT DETECTED NOT DETECTED Final   Haemophilus influenzae NOT DETECTED NOT DETECTED Final   Neisseria meningitidis NOT DETECTED NOT DETECTED Final   Pseudomonas aeruginosa NOT DETECTED NOT DETECTED Final   Candida albicans NOT DETECTED NOT DETECTED Final   Candida glabrata NOT DETECTED NOT DETECTED Final   Candida krusei NOT DETECTED NOT DETECTED Final   Candida parapsilosis NOT DETECTED NOT DETECTED Final   Candida tropicalis NOT DETECTED NOT DETECTED Final         Radiology Studies: Dg Chest 2 View  Result Date: 05/11/2016 CLINICAL DATA:  One day history of shortness of breath EXAM: CHEST  2 VIEW COMPARISON:  Chest CT October 27, 2013 FINDINGS: There is no edema or consolidation. The heart is slightly enlarged with pulmonary vascularity within normal limits. There is epicardial fat along the right and left sides the heart. Patient is status post coronary artery bypass grafting. No  adenopathy. There is degenerative change in the thoracic spine. IMPRESSION: No edema or consolidation.  Slight cardiomegaly. Electronically Signed   By: Lowella Grip III M.D.   On: 05/11/2016 16:07       Scheduled Meds: . aspirin EC  81 mg Oral Daily  . [START ON 05/13/2016] cefTRIAXone (ROCEPHIN)  IV  2 g Intravenous Q24H  . clopidogrel  75 mg Oral QAC breakfast  . enoxaparin (LOVENOX) injection  40 mg Subcutaneous Q24H  . famotidine  40 mg Oral BID  . gabapentin  300 mg Oral q morning - 10a  . metoprolol  50 mg Oral BID  . mometasone-formoterol  2 puff Inhalation BID  . rosuvastatin  40 mg Oral Daily  . sodium chloride flush  3 mL Intravenous Q12H  . Vitamin D (Ergocalciferol)  50,000 Units Oral Once per day on Tue Sat   Continuous Infusions: . sodium chloride    . 0.9 % NaCl with KCl 20 mEq / L 125 mL/hr at 05/12/16 0614     LOS: 1 day    Time spent: 35 minutes.     Elmarie Shiley, MD [facility] Pager [phone]  If 7PM-7AM, please contact night-coverage [email or web address] Password [email or web address] 05/12/2016, 10:12 AM

## 2016-05-13 LAB — CBC WITH DIFFERENTIAL/PLATELET
BASOS ABS: 0 10*3/uL (ref 0.0–0.1)
Basophils Relative: 0 %
Eosinophils Absolute: 0 10*3/uL (ref 0.0–0.7)
Eosinophils Relative: 0 %
HEMATOCRIT: 39.4 % (ref 39.0–52.0)
Hemoglobin: 12.5 g/dL — ABNORMAL LOW (ref 13.0–17.0)
LYMPHS ABS: 0.6 10*3/uL — AB (ref 0.7–4.0)
LYMPHS PCT: 11 %
MCH: 31.1 pg (ref 26.0–34.0)
MCHC: 31.7 g/dL (ref 30.0–36.0)
MCV: 98 fL (ref 78.0–100.0)
MONO ABS: 0.3 10*3/uL (ref 0.1–1.0)
MONOS PCT: 5 %
NEUTROS ABS: 4.3 10*3/uL (ref 1.7–7.7)
Neutrophils Relative %: 84 %
Platelets: 132 10*3/uL — ABNORMAL LOW (ref 150–400)
RBC: 4.02 MIL/uL — ABNORMAL LOW (ref 4.22–5.81)
RDW: 15.2 % (ref 11.5–15.5)
WBC: 5.2 10*3/uL (ref 4.0–10.5)

## 2016-05-13 LAB — BASIC METABOLIC PANEL
ANION GAP: 8 (ref 5–15)
BUN: 11 mg/dL (ref 6–20)
CALCIUM: 8.9 mg/dL (ref 8.9–10.3)
CO2: 24 mmol/L (ref 22–32)
Chloride: 104 mmol/L (ref 101–111)
Creatinine, Ser: 0.93 mg/dL (ref 0.61–1.24)
GLUCOSE: 84 mg/dL (ref 65–99)
Potassium: 4.3 mmol/L (ref 3.5–5.1)
Sodium: 136 mmol/L (ref 135–145)

## 2016-05-13 LAB — URINE CULTURE: Culture: 30000 — AB

## 2016-05-13 MED ORDER — DIPHENHYDRAMINE HCL 25 MG PO CAPS
25.0000 mg | ORAL_CAPSULE | Freq: Every evening | ORAL | Status: DC | PRN
  Administered 2016-05-13: 25 mg via ORAL
  Filled 2016-05-13: qty 1

## 2016-05-13 NOTE — Progress Notes (Signed)
Patient states he places himself on and off of CPAP when ready. RT informed patient if he needs any help have RN contact RT.

## 2016-05-13 NOTE — Care Management Note (Signed)
Case Management Note  Patient Details  Name: Sean Gardner MRN: [id number] Date of Birth: Oct 21, 1955  Subjective/Objective:                 Independent patient from home with wife. He uses CPAP at night at home. No oxygen needs. No difficulties getting or paying for meds or getting to PCP. No CM needs identified at this time. CM will continue to follow.   Action/Plan:  No CM needs identified. Expected Discharge Date:                  Expected Discharge Plan:  Home/Self Care  In-House Referral:     Discharge planning Services  CM Consult  Post Acute Care Choice:  NA Choice offered to:  NA  DME Arranged:  N/A DME Agency:  NA  HH Arranged:  NA HH Agency:  NA  Status of Service:  Completed, signed off  If discussed at [place] of Stay Meetings, dates discussed:    Additional Comments:  Carles Collet, RN 05/13/2016, 2:29 PM

## 2016-05-13 NOTE — Progress Notes (Signed)
PROGRESS NOTE    Sean Gardner  NOM:[id number] DOB: 1956/05/04 DOA: 05/11/2016 PCP: Gara Kroner, MD  Brief Narrative: Sean Gardner is a 60 y.o. male with medical history significant of hypertension, lung cancer, CAD/MI, peripheral vascular disease, sleep apnea on CPAP, morbid obesity, hyperlipidemia who comes in the emergency department with complaints of dysuria, fever, chills since about 3 AM 7-24.  Per patient, he woke up around 3 AM 7-24 sweating, febrile with chills. He does states that he has been having urgency and dysuria, but often has been unable to make it to the bathroom without being incontinent. He denies flank pain or urethral discharge. He denies abdominal pain, but complains of frequent nausea. He has vomited about 4-5 times today during the late morning and early afternoon.   Assessment & Plan:   Principal Problem:   Sepsis secondary to UTI [facility]) Active Problems:   Essential hypertension   Sleep apnea   CAD (coronary artery disease)   Hyperlipidemia     Sepsis secondary to UTI ([place]), E coli Bacteremia;  Continue IV fluids. Continue Rocephin 2 g IVPB 24 hours. Urine and blood cultures and sensitivity. Growing E coli.  Repeat blood culture obtained on 7/26 to ensure clearance of bacteremia  Urine retention;  Foley inserted on admission .  Start Flomax.  voiding trial on 7/26 Avoid constipation, Need outpatient urology consult    Essential hypertension Hold Avapro and lisinopril for now. Continue metoprolol 50 mg by mouth twice a day. Monitor blood pressure closely.    Sleep apnea Continue CPAP at bedtime.    CAD (coronary artery disease) Stable and not symptomatic. Continue aspirin, clopidogrel, beta blocker and statin.    Hyperlipidemia Continue Crestor 40 mg by mouth daily. Monitor LFTs periodically.       DVT prophylaxis: (Lovenox/Heparin/SCD's/anticoagulated/None (if comfort care) Code Status: Full code.  Family Communication:  Wife at bedside.  Disposition Plan: Home in 48 to 72 hours.    Consultants:   none  Procedures:  none  Antimicrobials:   Ceftriaxone 7-24   Subjective: Feeling better, want something to help him sleep at night He was having trouble emptying his bladder.   Objective: Vitals:   05/12/16 2021 05/12/16 2140 05/12/16 2323 05/13/16 0547  BP:  132/66  (!) 139/58  Pulse: 79 70 76 74  Resp: '18 17 18 16  '$ Temp:  98.7 F (37.1 C)  98.4 F (36.9 C)  TempSrc:  Oral  Oral  SpO2: 98% 97% 98% 100%  Weight:      Height:        Intake/Output Summary (Last 24 hours) at 05/13/16 0805 Last data filed at 05/13/16 0600  Gross per 24 hour  Intake          2853.33 ml  Output             1150 ml  Net          1703.33 ml   Filed Weights   05/11/16 2120  Weight: 134.4 kg (296 lb 4.8 oz)    Examination:  General exam: Appears calm and comfortable  Respiratory system: Clear to auscultation. Respiratory effort normal. Cardiovascular system: S1 & S2 heard, RRR. No JVD, murmurs, rubs, gallops or clicks. No pedal edema. Gastrointestinal system: Abdomen is nondistended, soft and nontender. No organomegaly or masses felt. Normal bowel sounds heard. Central nervous system: Alert and oriented. No focal neurological deficits. Extremities: Symmetric 5 x 5 power. Skin: No rashes, lesions or ulcers Psychiatry: Judgement and insight appear  normal. Mood & affect appropriate.     Data Reviewed: I have personally reviewed following labs and imaging studies  CBC:  Recent Labs Lab 05/11/16 1451 05/12/16 0751 05/13/16 0645  WBC 14.3* 10.1 5.2  NEUTROABS 13.3* 8.9* 4.3  HGB 14.6 12.6* 12.5*  HCT 43.7 38.2* 39.4  MCV 96.3 97.0 98.0  PLT 190 154 469*   Basic Metabolic Panel:  Recent Labs Lab 05/11/16 1451 05/12/16 0751 05/13/16 0645  NA 137 138 136  K 3.6 3.9 4.3  CL 107 110 104  CO2 21* 23 24  GLUCOSE 109* 97 84  BUN '10 10 11  '$ CREATININE 1.10 0.97 0.93  CALCIUM 9.4 8.5* 8.9    GFR: Estimated Creatinine Clearance: 116.8 mL/min (by C-G formula based on SCr of 0.93 mg/dL). Liver Function Tests:  Recent Labs Lab 05/11/16 1451 05/12/16 0751  AST 33 23  ALT 44 30  ALKPHOS 50 40  BILITOT 1.5* 1.1  PROT 6.6 5.3*  ALBUMIN 4.0 3.1*   No results for input(s): LIPASE, AMYLASE in the last 168 hours. No results for input(s): AMMONIA in the last 168 hours. Coagulation Profile: No results for input(s): INR, PROTIME in the last 168 hours. Cardiac Enzymes: No results for input(s): CKTOTAL, CKMB, CKMBINDEX, TROPONINI in the last 168 hours. BNP (last 3 results) No results for input(s): PROBNP in the last 8760 hours. HbA1C: No results for input(s): HGBA1C in the last 72 hours. CBG: No results for input(s): GLUCAP in the last 168 hours. Lipid Profile: No results for input(s): CHOL, HDL, LDLCALC, TRIG, CHOLHDL, LDLDIRECT in the last 72 hours. Thyroid Function Tests: No results for input(s): TSH, T4TOTAL, FREET4, T3FREE, THYROIDAB in the last 72 hours. Anemia Panel: No results for input(s): VITAMINB12, FOLATE, FERRITIN, TIBC, IRON, RETICCTPCT in the last 72 hours. Sepsis Labs:  Recent Labs Lab 05/11/16 1505 05/11/16 1752  LATICACIDVEN 2.73* 1.95*    Recent Results (from the past 240 hour(s))  Blood Culture (routine x 2)     Status: None (Preliminary result)   Collection Time: 05/11/16  2:51 PM  Result Value Ref Range Status   Specimen Description BLOOD LEFT FOREARM  Final   Special Requests BOTTLES DRAWN AEROBIC AND ANAEROBIC 5CC  Final   Culture NO GROWTH < 24 HOURS  Final   Report Status PENDING  Incomplete  Urine culture     Status: None (Preliminary result)   Collection Time: 05/11/16  3:15 PM  Result Value Ref Range Status   Specimen Description URINE, RANDOM  Final   Special Requests NONE  Final   Culture CULTURE REINCUBATED FOR BETTER GROWTH  Final   Report Status PENDING  Incomplete  Blood Culture (routine x 2)     Status: None (Preliminary  result)   Collection Time: 05/11/16  3:25 PM  Result Value Ref Range Status   Specimen Description BLOOD RIGHT HAND  Final   Special Requests BOTTLES DRAWN AEROBIC AND ANAEROBIC 5CC  Final   Culture  Setup Time   Final    GRAM NEGATIVE RODS IN BOTH AEROBIC AND ANAEROBIC BOTTLES Organism ID to follow CRITICAL RESULT CALLED TO, READ BACK BY AND VERIFIED WITH: K HIATT,PHARMD '@0715'$  05/12/16 MKELLY    Culture NO GROWTH < 24 HOURS  Final   Report Status PENDING  Incomplete  Blood Culture ID Panel (Reflexed)     Status: Abnormal   Collection Time: 05/11/16  3:25 PM  Result Value Ref Range Status   Enterococcus species NOT DETECTED NOT DETECTED Final  Vancomycin resistance NOT DETECTED NOT DETECTED Final   Listeria monocytogenes NOT DETECTED NOT DETECTED Final   Staphylococcus species NOT DETECTED NOT DETECTED Final   Staphylococcus aureus NOT DETECTED NOT DETECTED Final   Methicillin resistance NOT DETECTED NOT DETECTED Final   Streptococcus species NOT DETECTED NOT DETECTED Final   Streptococcus agalactiae NOT DETECTED NOT DETECTED Final   Streptococcus pneumoniae NOT DETECTED NOT DETECTED Final   Streptococcus pyogenes NOT DETECTED NOT DETECTED Final   Acinetobacter baumannii NOT DETECTED NOT DETECTED Final   Enterobacteriaceae species DETECTED (A) NOT DETECTED Final    Comment: CRITICAL RESULT CALLED TO, READ BACK BY AND VERIFIED WITH: K HIATT,PHARMD '@0715'$  05/12/16 MKELLY    Enterobacter cloacae complex NOT DETECTED NOT DETECTED Final   Escherichia coli DETECTED (A) NOT DETECTED Final    Comment: CRITICAL RESULT CALLED TO, READ BACK BY AND VERIFIED WITH: K HIATT,PHARMD '@0715'$  05/12/16 MKELLY    Klebsiella oxytoca NOT DETECTED NOT DETECTED Final   Klebsiella pneumoniae NOT DETECTED NOT DETECTED Final   Proteus species NOT DETECTED NOT DETECTED Final   Serratia marcescens NOT DETECTED NOT DETECTED Final   Carbapenem resistance NOT DETECTED NOT DETECTED Final   Haemophilus  influenzae NOT DETECTED NOT DETECTED Final   Neisseria meningitidis NOT DETECTED NOT DETECTED Final   Pseudomonas aeruginosa NOT DETECTED NOT DETECTED Final   Candida albicans NOT DETECTED NOT DETECTED Final   Candida glabrata NOT DETECTED NOT DETECTED Final   Candida krusei NOT DETECTED NOT DETECTED Final   Candida parapsilosis NOT DETECTED NOT DETECTED Final   Candida tropicalis NOT DETECTED NOT DETECTED Final         Radiology Studies: Dg Chest 2 View  Result Date: 05/11/2016 CLINICAL DATA:  One day history of shortness of breath EXAM: CHEST  2 VIEW COMPARISON:  Chest CT October 27, 2013 FINDINGS: There is no edema or consolidation. The heart is slightly enlarged with pulmonary vascularity within normal limits. There is epicardial fat along the right and left sides the heart. Patient is status post coronary artery bypass grafting. No adenopathy. There is degenerative change in the thoracic spine. IMPRESSION: No edema or consolidation.  Slight cardiomegaly. Electronically Signed   By: Lowella Grip III M.D.   On: 05/11/2016 16:07       Scheduled Meds: . aspirin EC  81 mg Oral Daily  . cefTRIAXone (ROCEPHIN)  IV  2 g Intravenous Q24H  . clopidogrel  75 mg Oral QAC breakfast  . enoxaparin (LOVENOX) injection  40 mg Subcutaneous Q24H  . famotidine  40 mg Oral BID  . gabapentin  300 mg Oral q morning - 10a  . metoprolol  50 mg Oral BID  . mometasone-formoterol  2 puff Inhalation BID  . rosuvastatin  40 mg Oral Daily  . sodium chloride flush  3 mL Intravenous Q12H  . tamsulosin  0.4 mg Oral QPC supper  . traZODone  50 mg Oral Once  . Vitamin D (Ergocalciferol)  50,000 Units Oral Once per day on Tue Sat   Continuous Infusions: . sodium chloride 100 mL/hr at 05/13/16 0430     LOS: 2 days    Time spent: 25 minutes.     Florencia Reasons, MD PhD [facility] Pager [phone]  If 7PM-7AM, please contact night-coverage [email or web address] Password [email or web address] 05/13/2016, 8:05 AM

## 2016-05-14 LAB — CULTURE, BLOOD (ROUTINE X 2)

## 2016-05-14 MED ORDER — TAMSULOSIN HCL 0.4 MG PO CAPS: 0.4000 mg | ORAL_CAPSULE | Freq: Every day | ORAL | 0 refills | Status: DC

## 2016-05-14 MED ORDER — DIPHENHYDRAMINE HCL 25 MG PO CAPS: 25.0000 mg | ORAL_CAPSULE | Freq: Every day | ORAL | Status: DC

## 2016-05-14 MED ORDER — ACETAMINOPHEN 500 MG PO TABS
500.0000 mg | ORAL_TABLET | Freq: Every day | ORAL | Status: DC
  Administered 2016-05-14: 500 mg via ORAL
  Filled 2016-05-14: qty 1

## 2016-05-14 MED ORDER — LISINOPRIL 40 MG PO TABS
40.0000 mg | ORAL_TABLET | Freq: Every day | ORAL | Status: DC
  Administered 2016-05-14 – 2016-05-15 (×2): 40 mg via ORAL
  Filled 2016-05-14 (×2): qty 1

## 2016-05-14 NOTE — Progress Notes (Signed)
PROGRESS NOTE    Sean Gardner  TFT:[id number] DOB: [birth year] DOA: 05/11/2016 PCP: Gara Kroner, MD  Brief Narrative: Sean Gardner is a 60 y.o. male with medical history significant of hypertension, lung cancer, CAD/MI, peripheral vascular disease, sleep apnea on CPAP, morbid obesity, hyperlipidemia who comes in the emergency department with complaints of dysuria, fever, chills since about 3 AM 7-24.  Per patient, he woke up around 3 AM 7-24 sweating, febrile with chills. He does states that he has been having urgency and dysuria, but often has been unable to make it to the bathroom without being incontinent. He denies flank pain or urethral discharge. He denies abdominal pain, but complains of frequent nausea. He has vomited about 4-5 times today during the late morning and early afternoon.   Assessment & Plan:   Principal Problem:   Sepsis secondary to UTI [facility]) Active Problems:   Essential hypertension   Sleep apnea   CAD (coronary artery disease)   Hyperlipidemia     Sepsis secondary to UTI ([place]), E coli Bacteremia;  Urine and blood cultures Growing pan sensitive E coli.  Repeat blood culture obtained on 7/26 to ensure clearance of bacteremia Received ivf from admission to 7/27,  Continue Rocephin 2 g IVPB for another 24 hours.anticipate d/c tomorrow with oral abx to finish total of 14 days treatment  Urine retention;  Foley inserted on admission .  Started Flomax.  Voided after foley removal on 7/26 Avoid constipation, Need outpatient urology consult,     Essential hypertension On both Avapro and lisinopril at home, these was held since admission, restarted lisinopril on 7/27. Continue metoprolol 50 mg by mouth twice a day. Monitor blood pressure closely.    Sleep apnea Continue CPAP at bedtime.    CAD (coronary artery disease) Stable and not symptomatic. Continue aspirin, clopidogrel, beta blocker and statin.    Hyperlipidemia Continue Crestor 40 mg by  mouth daily. Monitor LFTs periodically.       DVT prophylaxis: (Lovenox) Code Status: Full code.  Family Communication: Wife at bedside.  Disposition Plan: Home tomorrow.    Consultants:   none  Procedures:  none  Antimicrobials:   Ceftriaxone 7-24   Subjective: Not able to sleep well last night, otherwise Feeling better, denies pain, no fever, voided after foley removal   Objective: Vitals:   05/13/16 2111 05/13/16 2319 05/14/16 0616 05/14/16 0837  BP: (!) 159/54  140/84   Pulse: 77  68   Resp: 17  18   Temp: 98.8 F (37.1 C) 99 F (37.2 C) 97.4 F (36.3 C)   TempSrc: Oral Oral Oral   SpO2: 99%  95% 97%  Weight:      Height:        Intake/Output Summary (Last 24 hours) at 05/14/16 1013 Last data filed at 05/14/16 0618  Gross per 24 hour  Intake             2960 ml  Output             1050 ml  Net             1910 ml   Filed Weights   05/11/16 2120  Weight: 134.4 kg (296 lb 4.8 oz)    Examination:  General exam: Appears calm and comfortable  Respiratory system: Clear to auscultation. Respiratory effort normal. Cardiovascular system: S1 & S2 heard, RRR. No JVD, murmurs, rubs, gallops or clicks. No pedal edema. Gastrointestinal system: Abdomen is nondistended, soft and nontender. No organomegaly  or masses felt. Normal bowel sounds heard. Central nervous system: Alert and oriented. No focal neurological deficits. Extremities: Symmetric 5 x 5 power. Skin: No rashes, lesions or ulcers Psychiatry: Judgement and insight appear normal. Mood & affect appropriate.     Data Reviewed: I have personally reviewed following labs and imaging studies  CBC:  Recent Labs Lab 05/11/16 1451 05/12/16 0751 05/13/16 0645  WBC 14.3* 10.1 5.2  NEUTROABS 13.3* 8.9* 4.3  HGB 14.6 12.6* 12.5*  HCT 43.7 38.2* 39.4  MCV 96.3 97.0 98.0  PLT 190 154 465*   Basic Metabolic Panel:  Recent Labs Lab 05/11/16 1451 05/12/16 0751 05/13/16 0645  NA 137 138 136    K 3.6 3.9 4.3  CL 107 110 104  CO2 21* 23 24  GLUCOSE 109* 97 84  BUN '10 10 11  '$ CREATININE 1.10 0.97 0.93  CALCIUM 9.4 8.5* 8.9   GFR: Estimated Creatinine Clearance: 116.8 mL/min (by C-G formula based on SCr of 0.93 mg/dL). Liver Function Tests:  Recent Labs Lab 05/11/16 1451 05/12/16 0751  AST 33 23  ALT 44 30  ALKPHOS 50 40  BILITOT 1.5* 1.1  PROT 6.6 5.3*  ALBUMIN 4.0 3.1*   No results for input(s): LIPASE, AMYLASE in the last 168 hours. No results for input(s): AMMONIA in the last 168 hours. Coagulation Profile: No results for input(s): INR, PROTIME in the last 168 hours. Cardiac Enzymes: No results for input(s): CKTOTAL, CKMB, CKMBINDEX, TROPONINI in the last 168 hours. BNP (last 3 results) No results for input(s): PROBNP in the last 8760 hours. HbA1C: No results for input(s): HGBA1C in the last 72 hours. CBG: No results for input(s): GLUCAP in the last 168 hours. Lipid Profile: No results for input(s): CHOL, HDL, LDLCALC, TRIG, CHOLHDL, LDLDIRECT in the last 72 hours. Thyroid Function Tests: No results for input(s): TSH, T4TOTAL, FREET4, T3FREE, THYROIDAB in the last 72 hours. Anemia Panel: No results for input(s): VITAMINB12, FOLATE, FERRITIN, TIBC, IRON, RETICCTPCT in the last 72 hours. Sepsis Labs:  Recent Labs Lab 05/11/16 1505 05/11/16 1752  LATICACIDVEN 2.73* 1.95*    Recent Results (from the past 240 hour(s))  Blood Culture (routine x 2)     Status: None (Preliminary result)   Collection Time: 05/11/16  2:51 PM  Result Value Ref Range Status   Specimen Description BLOOD LEFT FOREARM  Final   Special Requests BOTTLES DRAWN AEROBIC AND ANAEROBIC 5CC  Final   Culture NO GROWTH 2 DAYS  Final   Report Status PENDING  Incomplete  Urine culture     Status: Abnormal   Collection Time: 05/11/16  3:15 PM  Result Value Ref Range Status   Specimen Description URINE, RANDOM  Final   Special Requests NONE  Final   Culture 30,000 COLONIES/mL ESCHERICHIA  COLI (A)  Final   Report Status 05/13/2016 FINAL  Final   Organism ID, Bacteria ESCHERICHIA COLI (A)  Final      Susceptibility   Escherichia coli - MIC*    AMPICILLIN <=2 SENSITIVE Sensitive     CEFAZOLIN <=4 SENSITIVE Sensitive     CEFTRIAXONE <=1 SENSITIVE Sensitive     CIPROFLOXACIN <=0.25 SENSITIVE Sensitive     GENTAMICIN <=1 SENSITIVE Sensitive     IMIPENEM <=0.25 SENSITIVE Sensitive     NITROFURANTOIN <=16 SENSITIVE Sensitive     TRIMETH/SULFA <=20 SENSITIVE Sensitive     AMPICILLIN/SULBACTAM <=2 SENSITIVE Sensitive     PIP/TAZO <=4 SENSITIVE Sensitive     * 30,000 COLONIES/mL ESCHERICHIA COLI  Blood Culture (routine x 2)     Status: Abnormal   Collection Time: 05/11/16  3:25 PM  Result Value Ref Range Status   Specimen Description BLOOD RIGHT HAND  Final   Special Requests BOTTLES DRAWN AEROBIC AND ANAEROBIC 5CC  Final   Culture  Setup Time   Final    GRAM NEGATIVE RODS IN BOTH AEROBIC AND ANAEROBIC BOTTLES CRITICAL RESULT CALLED TO, READ BACK BY AND VERIFIED WITH: K HIATT,PHARMD '@0715'$  05/12/16 MKELLY    Culture ESCHERICHIA COLI (A)  Final   Report Status 05/14/2016 FINAL  Final   Organism ID, Bacteria ESCHERICHIA COLI  Final      Susceptibility   Escherichia coli - MIC*    AMPICILLIN <=2 SENSITIVE Sensitive     CEFAZOLIN <=4 SENSITIVE Sensitive     CEFEPIME <=1 SENSITIVE Sensitive     CEFTAZIDIME <=1 SENSITIVE Sensitive     CEFTRIAXONE <=1 SENSITIVE Sensitive     CIPROFLOXACIN <=0.25 SENSITIVE Sensitive     GENTAMICIN <=1 SENSITIVE Sensitive     IMIPENEM <=0.25 SENSITIVE Sensitive     TRIMETH/SULFA <=20 SENSITIVE Sensitive     AMPICILLIN/SULBACTAM <=2 SENSITIVE Sensitive     PIP/TAZO <=4 SENSITIVE Sensitive     * ESCHERICHIA COLI  Blood Culture ID Panel (Reflexed)     Status: Abnormal   Collection Time: 05/11/16  3:25 PM  Result Value Ref Range Status   Enterococcus species NOT DETECTED NOT DETECTED Final   Vancomycin resistance NOT DETECTED NOT DETECTED Final    Listeria monocytogenes NOT DETECTED NOT DETECTED Final   Staphylococcus species NOT DETECTED NOT DETECTED Final   Staphylococcus aureus NOT DETECTED NOT DETECTED Final   Methicillin resistance NOT DETECTED NOT DETECTED Final   Streptococcus species NOT DETECTED NOT DETECTED Final   Streptococcus agalactiae NOT DETECTED NOT DETECTED Final   Streptococcus pneumoniae NOT DETECTED NOT DETECTED Final   Streptococcus pyogenes NOT DETECTED NOT DETECTED Final   Acinetobacter baumannii NOT DETECTED NOT DETECTED Final   Enterobacteriaceae species DETECTED (A) NOT DETECTED Final    Comment: CRITICAL RESULT CALLED TO, READ BACK BY AND VERIFIED WITH: K HIATT,PHARMD '@0715'$  05/12/16 MKELLY    Enterobacter cloacae complex NOT DETECTED NOT DETECTED Final   Escherichia coli DETECTED (A) NOT DETECTED Final    Comment: CRITICAL RESULT CALLED TO, READ BACK BY AND VERIFIED WITH: K HIATT,PHARMD '@0715'$  05/12/16 MKELLY    Klebsiella oxytoca NOT DETECTED NOT DETECTED Final   Klebsiella pneumoniae NOT DETECTED NOT DETECTED Final   Proteus species NOT DETECTED NOT DETECTED Final   Serratia marcescens NOT DETECTED NOT DETECTED Final   Carbapenem resistance NOT DETECTED NOT DETECTED Final   Haemophilus influenzae NOT DETECTED NOT DETECTED Final   Neisseria meningitidis NOT DETECTED NOT DETECTED Final   Pseudomonas aeruginosa NOT DETECTED NOT DETECTED Final   Candida albicans NOT DETECTED NOT DETECTED Final   Candida glabrata NOT DETECTED NOT DETECTED Final   Candida krusei NOT DETECTED NOT DETECTED Final   Candida parapsilosis NOT DETECTED NOT DETECTED Final   Candida tropicalis NOT DETECTED NOT DETECTED Final         Radiology Studies: No results found.      Scheduled Meds: . acetaminophen  500 mg Oral QHS  . aspirin EC  81 mg Oral Daily  . cefTRIAXone (ROCEPHIN)  IV  2 g Intravenous Q24H  . clopidogrel  75 mg Oral QAC breakfast  . diphenhydrAMINE  25 mg Oral QHS  . enoxaparin (LOVENOX)  injection  40 mg Subcutaneous Q24H  .  famotidine  40 mg Oral BID  . gabapentin  300 mg Oral q morning - 10a  . metoprolol  50 mg Oral BID  . mometasone-formoterol  2 puff Inhalation BID  . rosuvastatin  40 mg Oral Daily  . sodium chloride flush  3 mL Intravenous Q12H  . tamsulosin  0.4 mg Oral QPC supper  . traZODone  50 mg Oral Once  . Vitamin D (Ergocalciferol)  50,000 Units Oral Once per day on Tue Sat   Continuous Infusions:     LOS: 3 days    Time spent: 25 minutes.     Florencia Reasons, MD PhD [facility] Pager [phone]  If 7PM-7AM, please contact night-coverage [email or web address] Password [facility] 05/14/2016, 10:13 AM

## 2016-05-14 NOTE — Progress Notes (Signed)
Patient to self administer CPAP.  Water chamber full and unit ready for use.  Pt will call if he needs further assistance.

## 2016-05-15 MED ORDER — FLORANEX PO PACK: 1.0000 g | PACK | Freq: Three times a day (TID) | ORAL | Status: DC

## 2016-05-15 MED ORDER — FLORANEX PO PACK: 1.0000 g | PACK | Freq: Three times a day (TID) | ORAL | 0 refills | Status: DC

## 2016-05-15 MED ORDER — AMOXICILLIN-POT CLAVULANATE 875-125 MG PO TABS: 1.0000 | ORAL_TABLET | Freq: Two times a day (BID) | ORAL | 0 refills | Status: DC

## 2016-05-15 NOTE — Discharge Summary (Signed)
Discharge Summary  Sean Gardner ZOX:[id number] DOB: 1956-03-16  PCP: Gara Kroner, MD  Admit date: 05/11/2016 Discharge date: 05/15/2016  Time spent: <73mns  Recommendations for Outpatient Follow-up:  1. F/u with PMD within a week  for hospital discharge follow up, repeat cbc/bmp at follow up 2. F/u with urology   Discharge Diagnoses:  Active Hospital Problems   Diagnosis Date Noted  . [place] ([place] 05/11/2016  . CAD (coronary artery disease) 05/11/2016  . Hyperlipidemia 05/11/2016  . Sleep apnea 09/18/2015  . Essential hypertension 10/11/2012    Resolved Hospital Problems   Diagnosis Date Noted Date Resolved  No resolved problems to display.    Discharge Condition: stable  Diet recommendation: heart healthy  Filed Weights   05/11/16 2120  Weight: 134.4 kg (296 lb 4.8 oz)    History of present illness:  Chief Complaint: Dysuria, fever and vomiting  HPI: Sean KITTLESONis a 60y.o. male with medical history significant of hypertension, lung cancer, CAD/MI, peripheral vascular disease, sleep apnea on CPAP, morbid obesity, hyperlipidemia who comes in the emergency department with complaints of dysuria, fever, chills since about 3 AM today.  Per patient, he woke up around 3 AM today sweating, febrile with chills. He does states that he has been having urgency and dysuria, but often has been unable to make it to the bathroom without being incontinent. He denies flank pain or urethral discharge. He denies abdominal pain, but complains of frequent nausea. He has vomited about 4-5 times today during the late morning and early afternoon.   He denies chest pain, palpitations, dizziness, orthopnea, pedal edema the lower extremities, but complains of having an episode of dyspnea around 11:30 AM when he started having severe symptoms of nausea and emesis. He denies further dyspnea since then.  ED Course: Workup in the ER shows leukocytosis of 14.3 K, elevated  lactic acid level at 2.73 with an abnormal urine analysis with pyuria and bacteriuria. He was given IV fluids and IV antibiotics.  Hospital Course:  Principal Problem:   Sepsis secondary to UTI ([facility] Active Problems:   Essential hypertension   Sleep apnea   CAD (coronary artery disease)   Hyperlipidemia   [place], E coli Bacteremia;  Urine and blood cultures Growing pan sensitive E coli.  Repeat blood culture obtained on 7/26 , no growth Received ivf from admission to 7/27,  Continue Rocephin 2 g IVPB x5 doses in the hospital, discharged home on augmentin for 9 days to finish 14 days treatment.  Urine retention;  Foley inserted on admission .  Started Flomax.  Voided after foley removal on 7/26 Avoid constipation, Need outpatient urology follow up  Essential hypertension On both Avapro and lisinopril at home, these was held since admission, restarted at discharge Continue metoprolol 50 mg by mouth twice a day. Monitor blood pressure closely.  Sleep apnea Continue CPAP at bedtime.  CAD (coronary artery disease) Stable and not symptomatic. Continue aspirin, clopidogrel, beta blocker and statin.  Hyperlipidemia Continue Crestor 40 mg by mouth daily. Monitor LFTs periodically.   morbid obesity: Body mass index is 40.19 kg/m.     DVT prophylaxis while in the hospital: (Lovenox) Code Status: Full code.  Family Communication: Wife at bedside.  Disposition Plan: Home on 7/28   Consultants:   none  Procedures:  none  Antimicrobials:   Ceftriaxone 7-24   Discharge Exam: BP (!) 160/69 (BP Location: Left Arm)   Pulse (!) 58  Temp 98.2 F (36.8 C)   Resp 20   Ht 6' (1.829 m)   Wt 134.4 kg (296 lb 4.8 oz)   SpO2 96%   BMI 40.19 kg/m    General exam: Appears calm and comfortable  Respiratory system: Clear to auscultation. Respiratory effort normal. Cardiovascular system: S1 & S2 heard, RRR. No JVD,  murmurs, rubs, gallops or clicks. No pedal edema. Gastrointestinal system: Abdomen is nondistended, soft and nontender. No organomegaly or masses felt. Normal bowel sounds heard. Central nervous system: Alert and oriented. No focal neurological deficits. Extremities: Symmetric 5 x 5 power. Skin: No rashes, lesions or ulcers Psychiatry: Judgement and insight appear normal. Mood & affect appropriate.    Discharge Instructions You were cared for by a hospitalist during your hospital stay. If you have any questions about your discharge medications or the care you received while you were in the hospital after you are discharged, you can call the unit and asked to speak with the hospitalist on call if the hospitalist that took care of you is not available. Once you are discharged, your primary care physician will handle any further medical issues. Please note that NO REFILLS for any discharge medications will be authorized once you are discharged, as it is imperative that you return to your primary care physician (or establish a relationship with a primary care physician if you do not have one) for your aftercare needs so that they can reassess your need for medications and monitor your lab values.  Discharge Instructions    Diet - low sodium heart healthy    Complete by:  As directed   Increase activity slowly    Complete by:  As directed       Medication List    TAKE these medications   amoxicillin-clavulanate 875-125 MG tablet Commonly known as:  AUGMENTIN Take 1 tablet by mouth 2 (two) times daily.   aspirin 81 MG tablet Take 81 mg by mouth daily.   clopidogrel 75 MG tablet Commonly known as:  PLAVIX Take 1 tablet (75 mg total) by mouth daily before breakfast.   gabapentin 300 MG capsule Commonly known as:  NEURONTIN Take 300 mg by mouth every morning.   irbesartan 300 MG tablet Commonly known as:  AVAPRO Take 1 tablet (300 mg total) by mouth daily.   lactobacillus Pack Take 1  packet (1 g total) by mouth 3 (three) times daily with meals.   lisinopril 40 MG tablet Commonly known as:  PRINIVIL,ZESTRIL Take 1 tablet by mouth daily.   metoprolol 50 MG tablet Commonly known as:  LOPRESSOR Take 50 mg by mouth 2 (two) times daily.   ranitidine 300 MG tablet Commonly known as:  ZANTAC Take 1 tablet by mouth 2 (two) times daily.   rosuvastatin 40 MG tablet Commonly known as:  CRESTOR Take 40 mg by mouth daily.   SYMBICORT 160-4.5 MCG/ACT inhaler Generic drug:  budesonide-formoterol Inhale 2 puffs into the lungs 2 (two) times daily.   tamsulosin 0.4 MG Caps capsule Commonly known as:  FLOMAX Take 1 capsule (0.4 mg total) by mouth daily after supper.   Vitamin D (Ergocalciferol) 50000 units Caps capsule Commonly known as:  DRISDOL Take 1 capsule by mouth 2 (two) times a week.      Allergies  Allergen Reactions  . Zolpidem Tartrate Other (See Comments)    Felt fuzzy and hallucinations   Follow-up Information    SWAYNE,DAVID W, MD Follow up in 1 week(s).   Specialty:  Family  Medicine Why:  hospital discharge follow up Contact information: [place] [address] [facility] Follow up in 2 week(s).   Why:  urinary tract infection Contact information: [address]  [place] [address] [place] [address] [phone]            The results of significant diagnostics from this hospitalization (including imaging, microbiology, ancillary and laboratory) are listed below for reference.    Significant Diagnostic Studies: Dg Chest 2 View  Result Date: 05/11/2016 CLINICAL DATA:  One day history of shortness of breath EXAM: CHEST  2 VIEW COMPARISON:  Chest CT October 27, 2013 FINDINGS: There is no edema or consolidation. The heart is slightly enlarged with pulmonary vascularity within normal limits. There is epicardial fat along the right and left sides the heart. Patient is status post  coronary artery bypass grafting. No adenopathy. There is degenerative change in the thoracic spine. IMPRESSION: No edema or consolidation.  Slight cardiomegaly. Electronically Signed   By: Lowella Grip III M.D.   On: 05/11/2016 16:07   Microbiology: Recent Results (from the past 240 hour(s))  Blood Culture (routine x 2)     Status: None (Preliminary result)   Collection Time: 05/11/16  2:51 PM  Result Value Ref Range Status   Specimen Description BLOOD LEFT FOREARM  Final   Special Requests BOTTLES DRAWN AEROBIC AND ANAEROBIC 5CC  Final   Culture NO GROWTH 3 DAYS  Final   Report Status PENDING  Incomplete  Urine culture     Status: Abnormal   Collection Time: 05/11/16  3:15 PM  Result Value Ref Range Status   Specimen Description URINE, RANDOM  Final   Special Requests NONE  Final   Culture 30,000 COLONIES/mL ESCHERICHIA COLI (A)  Final   Report Status 05/13/2016 FINAL  Final   Organism ID, Bacteria ESCHERICHIA COLI (A)  Final      Susceptibility   Escherichia coli - MIC*    AMPICILLIN <=2 SENSITIVE Sensitive     CEFAZOLIN <=4 SENSITIVE Sensitive     CEFTRIAXONE <=1 SENSITIVE Sensitive     CIPROFLOXACIN <=0.25 SENSITIVE Sensitive     GENTAMICIN <=1 SENSITIVE Sensitive     IMIPENEM <=0.25 SENSITIVE Sensitive     NITROFURANTOIN <=16 SENSITIVE Sensitive     TRIMETH/SULFA <=20 SENSITIVE Sensitive     AMPICILLIN/SULBACTAM <=2 SENSITIVE Sensitive     PIP/TAZO <=4 SENSITIVE Sensitive     * 30,000 COLONIES/mL ESCHERICHIA COLI  Blood Culture (routine x 2)     Status: Abnormal   Collection Time: 05/11/16  3:25 PM  Result Value Ref Range Status   Specimen Description BLOOD RIGHT HAND  Final   Special Requests BOTTLES DRAWN AEROBIC AND ANAEROBIC 5CC  Final   Culture  Setup Time   Final    GRAM NEGATIVE RODS IN BOTH AEROBIC AND ANAEROBIC BOTTLES CRITICAL RESULT CALLED TO, READ BACK BY AND VERIFIED WITH: K HIATT,PHARMD '@0715'$  05/12/16 MKELLY    Culture ESCHERICHIA COLI (A)  Final    Report Status 05/14/2016 FINAL  Final   Organism ID, Bacteria ESCHERICHIA COLI  Final      Susceptibility   Escherichia coli - MIC*    AMPICILLIN <=2 SENSITIVE Sensitive     CEFAZOLIN <=4 SENSITIVE Sensitive     CEFEPIME <=1 SENSITIVE Sensitive     CEFTAZIDIME <=1 SENSITIVE Sensitive     CEFTRIAXONE <=1 SENSITIVE Sensitive     CIPROFLOXACIN <=  0.25 SENSITIVE Sensitive     GENTAMICIN <=1 SENSITIVE Sensitive     IMIPENEM <=0.25 SENSITIVE Sensitive     TRIMETH/SULFA <=20 SENSITIVE Sensitive     AMPICILLIN/SULBACTAM <=2 SENSITIVE Sensitive     PIP/TAZO <=4 SENSITIVE Sensitive     * ESCHERICHIA COLI  Blood Culture ID Panel (Reflexed)     Status: Abnormal   Collection Time: 05/11/16  3:25 PM  Result Value Ref Range Status   Enterococcus species NOT DETECTED NOT DETECTED Final   Vancomycin resistance NOT DETECTED NOT DETECTED Final   Listeria monocytogenes NOT DETECTED NOT DETECTED Final   Staphylococcus species NOT DETECTED NOT DETECTED Final   Staphylococcus aureus NOT DETECTED NOT DETECTED Final   Methicillin resistance NOT DETECTED NOT DETECTED Final   Streptococcus species NOT DETECTED NOT DETECTED Final   Streptococcus agalactiae NOT DETECTED NOT DETECTED Final   Streptococcus pneumoniae NOT DETECTED NOT DETECTED Final   Streptococcus pyogenes NOT DETECTED NOT DETECTED Final   Acinetobacter baumannii NOT DETECTED NOT DETECTED Final   Enterobacteriaceae species DETECTED (A) NOT DETECTED Final    Comment: CRITICAL RESULT CALLED TO, READ BACK BY AND VERIFIED WITH: K HIATT,PHARMD '@0715'$  05/12/16 MKELLY    Enterobacter cloacae complex NOT DETECTED NOT DETECTED Final   Escherichia coli DETECTED (A) NOT DETECTED Final    Comment: CRITICAL RESULT CALLED TO, READ BACK BY AND VERIFIED WITH: K HIATT,PHARMD '@0715'$  05/12/16 MKELLY    Klebsiella oxytoca NOT DETECTED NOT DETECTED Final   Klebsiella pneumoniae NOT DETECTED NOT DETECTED Final   Proteus species NOT DETECTED NOT DETECTED Final    Serratia marcescens NOT DETECTED NOT DETECTED Final   Carbapenem resistance NOT DETECTED NOT DETECTED Final   Haemophilus influenzae NOT DETECTED NOT DETECTED Final   Neisseria meningitidis NOT DETECTED NOT DETECTED Final   Pseudomonas aeruginosa NOT DETECTED NOT DETECTED Final   Candida albicans NOT DETECTED NOT DETECTED Final   Candida glabrata NOT DETECTED NOT DETECTED Final   Candida krusei NOT DETECTED NOT DETECTED Final   Candida parapsilosis NOT DETECTED NOT DETECTED Final   Candida tropicalis NOT DETECTED NOT DETECTED Final  Culture, blood (Routine X 2) w Reflex to ID Panel     Status: None (Preliminary result)   Collection Time: 05/13/16 12:00 PM  Result Value Ref Range Status   Specimen Description BLOOD RIGHT ANTECUBITAL  Final   Special Requests BOTTLES DRAWN AEROBIC ONLY  [place]  Final   Culture NO GROWTH 1 DAY  Final   Report Status PENDING  Incomplete  Culture, blood (Routine X 2) w Reflex to ID Panel     Status: None (Preliminary result)   Collection Time: 05/13/16 12:05 PM  Result Value Ref Range Status   Specimen Description BLOOD RIGHT HAND  Final   Special Requests BOTTLES DRAWN AEROBIC ONLY 10CC  Final   Culture NO GROWTH 1 DAY  Final   Report Status PENDING  Incomplete     Labs: Basic Metabolic Panel:  Recent Labs Lab 05/11/16 1451 05/12/16 0751 05/13/16 0645  NA 137 138 136  K 3.6 3.9 4.3  CL 107 110 104  CO2 21* 23 24  GLUCOSE 109* 97 84  BUN '10 10 11  '$ CREATININE 1.10 0.97 0.93  CALCIUM 9.4 8.5* 8.9   Liver Function Tests:  Recent Labs Lab 05/11/16 1451 05/12/16 0751  AST 33 23  ALT 44 30  ALKPHOS 50 40  BILITOT 1.5* 1.1  PROT 6.6 5.3*  ALBUMIN 4.0 3.1*   No results for input(s): LIPASE,  AMYLASE in the last 168 hours. No results for input(s): AMMONIA in the last 168 hours. CBC:  Recent Labs Lab 05/11/16 1451 05/12/16 0751 05/13/16 0645  WBC 14.3* 10.1 5.2  NEUTROABS 13.3* 8.9* 4.3  HGB 14.6 12.6* 12.5*  HCT 43.7 38.2* 39.4  MCV  96.3 97.0 98.0  PLT 190 154 132*   Cardiac Enzymes: No results for input(s): CKTOTAL, CKMB, CKMBINDEX, TROPONINI in the last 168 hours. BNP: BNP (last 3 results) No results for input(s): BNP in the last 8760 hours.  ProBNP (last 3 results) No results for input(s): PROBNP in the last 8760 hours.  CBG: No results for input(s): GLUCAP in the last 168 hours.     SignedFlorencia Reasons MD, PhD  [facility] 05/15/2016, 1:33 PM

## 2016-05-16 LAB — CULTURE, BLOOD (ROUTINE X 2): CULTURE: NO GROWTH

## 2016-05-18 LAB — CULTURE, BLOOD (ROUTINE X 2)
CULTURE: NO GROWTH
CULTURE: NO GROWTH

## 2016-05-26 DIAGNOSIS — G4733 Obstructive sleep apnea (adult) (pediatric): Secondary | ICD-10-CM | POA: Diagnosis not present

## 2016-05-26 DIAGNOSIS — R339 Retention of urine, unspecified: Secondary | ICD-10-CM | POA: Diagnosis not present

## 2016-05-26 DIAGNOSIS — E559 Vitamin D deficiency, unspecified: Secondary | ICD-10-CM | POA: Diagnosis not present

## 2016-05-26 DIAGNOSIS — J449 Chronic obstructive pulmonary disease, unspecified: Secondary | ICD-10-CM | POA: Diagnosis not present

## 2016-05-26 DIAGNOSIS — K219 Gastro-esophageal reflux disease without esophagitis: Secondary | ICD-10-CM | POA: Diagnosis not present

## 2016-05-26 DIAGNOSIS — I2510 Atherosclerotic heart disease of native coronary artery without angina pectoris: Secondary | ICD-10-CM | POA: Diagnosis not present

## 2016-05-26 DIAGNOSIS — G629 Polyneuropathy, unspecified: Secondary | ICD-10-CM | POA: Diagnosis not present

## 2016-05-26 DIAGNOSIS — I10 Essential (primary) hypertension: Secondary | ICD-10-CM | POA: Diagnosis not present

## 2016-05-26 DIAGNOSIS — Z87440 Personal history of urinary (tract) infections: Secondary | ICD-10-CM | POA: Diagnosis not present

## 2016-05-26 DIAGNOSIS — C3490 Malignant neoplasm of unspecified part of unspecified bronchus or lung: Secondary | ICD-10-CM | POA: Diagnosis not present

## 2016-05-26 DIAGNOSIS — E782 Mixed hyperlipidemia: Secondary | ICD-10-CM | POA: Diagnosis not present

## 2016-09-03 DIAGNOSIS — E782 Mixed hyperlipidemia: Secondary | ICD-10-CM | POA: Diagnosis not present

## 2016-09-03 DIAGNOSIS — I25810 Atherosclerosis of coronary artery bypass graft(s) without angina pectoris: Secondary | ICD-10-CM | POA: Diagnosis not present

## 2016-09-03 DIAGNOSIS — I2510 Atherosclerotic heart disease of native coronary artery without angina pectoris: Secondary | ICD-10-CM | POA: Diagnosis not present

## 2016-09-03 DIAGNOSIS — I70213 Atherosclerosis of native arteries of extremities with intermittent claudication, bilateral legs: Secondary | ICD-10-CM | POA: Diagnosis not present

## 2017-02-02 DIAGNOSIS — M545 Low back pain: Secondary | ICD-10-CM | POA: Diagnosis not present

## 2017-02-25 DIAGNOSIS — E782 Mixed hyperlipidemia: Secondary | ICD-10-CM | POA: Diagnosis not present

## 2017-02-25 DIAGNOSIS — G629 Polyneuropathy, unspecified: Secondary | ICD-10-CM | POA: Diagnosis not present

## 2017-02-25 DIAGNOSIS — J449 Chronic obstructive pulmonary disease, unspecified: Secondary | ICD-10-CM | POA: Diagnosis not present

## 2017-02-25 DIAGNOSIS — M545 Low back pain: Secondary | ICD-10-CM | POA: Diagnosis not present

## 2017-02-25 DIAGNOSIS — I10 Essential (primary) hypertension: Secondary | ICD-10-CM | POA: Diagnosis not present

## 2017-02-25 DIAGNOSIS — I2510 Atherosclerotic heart disease of native coronary artery without angina pectoris: Secondary | ICD-10-CM | POA: Diagnosis not present

## 2017-02-25 DIAGNOSIS — K219 Gastro-esophageal reflux disease without esophagitis: Secondary | ICD-10-CM | POA: Diagnosis not present

## 2017-02-25 DIAGNOSIS — G4733 Obstructive sleep apnea (adult) (pediatric): Secondary | ICD-10-CM | POA: Diagnosis not present

## 2017-02-25 DIAGNOSIS — E559 Vitamin D deficiency, unspecified: Secondary | ICD-10-CM | POA: Diagnosis not present

## 2017-02-25 DIAGNOSIS — M7661 Achilles tendinitis, right leg: Secondary | ICD-10-CM | POA: Diagnosis not present

## 2017-02-25 DIAGNOSIS — C3490 Malignant neoplasm of unspecified part of unspecified bronchus or lung: Secondary | ICD-10-CM | POA: Diagnosis not present

## 2017-07-14 DIAGNOSIS — M25551 Pain in right hip: Secondary | ICD-10-CM | POA: Diagnosis not present

## 2017-07-27 DIAGNOSIS — M5136 Other intervertebral disc degeneration, lumbar region: Secondary | ICD-10-CM | POA: Diagnosis not present

## 2017-07-27 DIAGNOSIS — M5416 Radiculopathy, lumbar region: Secondary | ICD-10-CM | POA: Diagnosis not present

## 2017-08-05 DIAGNOSIS — M545 Low back pain: Secondary | ICD-10-CM | POA: Diagnosis not present

## 2017-08-05 DIAGNOSIS — G8929 Other chronic pain: Secondary | ICD-10-CM | POA: Diagnosis not present

## 2017-08-10 DIAGNOSIS — M5416 Radiculopathy, lumbar region: Secondary | ICD-10-CM | POA: Diagnosis not present

## 2017-08-10 DIAGNOSIS — M5136 Other intervertebral disc degeneration, lumbar region: Secondary | ICD-10-CM | POA: Diagnosis not present

## 2017-08-30 DIAGNOSIS — G629 Polyneuropathy, unspecified: Secondary | ICD-10-CM | POA: Diagnosis not present

## 2017-08-30 DIAGNOSIS — M5136 Other intervertebral disc degeneration, lumbar region: Secondary | ICD-10-CM | POA: Diagnosis not present

## 2017-08-30 DIAGNOSIS — E559 Vitamin D deficiency, unspecified: Secondary | ICD-10-CM | POA: Diagnosis not present

## 2017-08-30 DIAGNOSIS — J449 Chronic obstructive pulmonary disease, unspecified: Secondary | ICD-10-CM | POA: Diagnosis not present

## 2017-08-30 DIAGNOSIS — K219 Gastro-esophageal reflux disease without esophagitis: Secondary | ICD-10-CM | POA: Diagnosis not present

## 2017-08-30 DIAGNOSIS — M7661 Achilles tendinitis, right leg: Secondary | ICD-10-CM | POA: Diagnosis not present

## 2017-08-30 DIAGNOSIS — C3490 Malignant neoplasm of unspecified part of unspecified bronchus or lung: Secondary | ICD-10-CM | POA: Diagnosis not present

## 2017-08-30 DIAGNOSIS — E782 Mixed hyperlipidemia: Secondary | ICD-10-CM | POA: Diagnosis not present

## 2017-08-30 DIAGNOSIS — G4733 Obstructive sleep apnea (adult) (pediatric): Secondary | ICD-10-CM | POA: Diagnosis not present

## 2017-08-30 DIAGNOSIS — I10 Essential (primary) hypertension: Secondary | ICD-10-CM | POA: Diagnosis not present

## 2017-08-30 DIAGNOSIS — I2510 Atherosclerotic heart disease of native coronary artery without angina pectoris: Secondary | ICD-10-CM | POA: Diagnosis not present

## 2017-10-25 DIAGNOSIS — I25810 Atherosclerosis of coronary artery bypass graft(s) without angina pectoris: Secondary | ICD-10-CM | POA: Diagnosis not present

## 2017-10-25 DIAGNOSIS — I70213 Atherosclerosis of native arteries of extremities with intermittent claudication, bilateral legs: Secondary | ICD-10-CM | POA: Diagnosis not present

## 2017-10-25 DIAGNOSIS — I2510 Atherosclerotic heart disease of native coronary artery without angina pectoris: Secondary | ICD-10-CM | POA: Diagnosis not present

## 2017-10-25 DIAGNOSIS — R0602 Shortness of breath: Secondary | ICD-10-CM | POA: Diagnosis not present

## 2017-11-04 DIAGNOSIS — R0602 Shortness of breath: Secondary | ICD-10-CM | POA: Diagnosis not present

## 2017-11-04 DIAGNOSIS — R079 Chest pain, unspecified: Secondary | ICD-10-CM | POA: Diagnosis not present

## 2017-11-22 DIAGNOSIS — J069 Acute upper respiratory infection, unspecified: Secondary | ICD-10-CM | POA: Diagnosis not present

## 2017-11-22 DIAGNOSIS — J449 Chronic obstructive pulmonary disease, unspecified: Secondary | ICD-10-CM | POA: Diagnosis not present

## 2017-11-26 ENCOUNTER — Emergency Department (HOSPITAL_COMMUNITY): Payer: Medicare Other

## 2017-11-26 ENCOUNTER — Encounter (HOSPITAL_COMMUNITY): Payer: Self-pay | Admitting: *Deleted

## 2017-11-26 ENCOUNTER — Emergency Department (HOSPITAL_COMMUNITY)
Admission: EM | Admit: 2017-11-26 | Discharge: 2017-11-26 | Disposition: A | Discharge status: Home / Self Care | Payer: Medicare Other | Attending: Emergency Medicine | Admitting: Emergency Medicine

## 2017-11-26 ENCOUNTER — Other Ambulatory Visit: Payer: Self-pay

## 2017-11-26 DIAGNOSIS — Z87891 Personal history of nicotine dependence: Secondary | ICD-10-CM | POA: Diagnosis not present

## 2017-11-26 DIAGNOSIS — Z85118 Personal history of other malignant neoplasm of bronchus and lung: Secondary | ICD-10-CM | POA: Diagnosis not present

## 2017-11-26 DIAGNOSIS — R079 Chest pain, unspecified: Secondary | ICD-10-CM

## 2017-11-26 DIAGNOSIS — Z7982 Long term (current) use of aspirin: Secondary | ICD-10-CM | POA: Diagnosis not present

## 2017-11-26 DIAGNOSIS — R0602 Shortness of breath: Secondary | ICD-10-CM | POA: Insufficient documentation

## 2017-11-26 DIAGNOSIS — Z79899 Other long term (current) drug therapy: Secondary | ICD-10-CM | POA: Insufficient documentation

## 2017-11-26 DIAGNOSIS — I2510 Atherosclerotic heart disease of native coronary artery without angina pectoris: Secondary | ICD-10-CM | POA: Insufficient documentation

## 2017-11-26 DIAGNOSIS — I10 Essential (primary) hypertension: Secondary | ICD-10-CM | POA: Diagnosis not present

## 2017-11-26 DIAGNOSIS — R0789 Other chest pain: Secondary | ICD-10-CM | POA: Insufficient documentation

## 2017-11-26 DIAGNOSIS — E785 Hyperlipidemia, unspecified: Secondary | ICD-10-CM | POA: Diagnosis not present

## 2017-11-26 DIAGNOSIS — R0609 Other forms of dyspnea: Secondary | ICD-10-CM | POA: Diagnosis not present

## 2017-11-26 LAB — BASIC METABOLIC PANEL
Anion gap: 12 (ref 5–15)
BUN: 13 mg/dL (ref 6–20)
CHLORIDE: 107 mmol/L (ref 101–111)
CO2: 19 mmol/L — AB (ref 22–32)
CREATININE: 0.84 mg/dL (ref 0.61–1.24)
Calcium: 9.9 mg/dL (ref 8.9–10.3)
GFR calc Af Amer: >60 mL/min (ref >60)
GFR calc non Af Amer: >60 mL/min (ref >60)
Glucose, Bld: 108 mg/dL — ABNORMAL HIGH (ref 65–99)
Potassium: 4.1 mmol/L (ref 3.5–5.1)
SODIUM: 138 mmol/L (ref 135–145)

## 2017-11-26 LAB — CBC
HEMATOCRIT: 43.8 % (ref 39.0–52.0)
Hemoglobin: 14.6 g/dL (ref 13.0–17.0)
MCH: 32.5 pg (ref 26.0–34.0)
MCHC: 33.3 g/dL (ref 30.0–36.0)
MCV: 97.6 fL (ref 78.0–100.0)
Platelets: 267 10*3/uL (ref 150–400)
RBC: 4.49 MIL/uL (ref 4.22–5.81)
RDW: 14.6 % (ref 11.5–15.5)
WBC: 7.5 10*3/uL (ref 4.0–10.5)

## 2017-11-26 LAB — I-STAT TROPONIN, ED
TROPONIN I, POC: 0.02 ng/mL (ref 0.00–0.08)
TROPONIN I, POC: 0.03 ng/mL (ref 0.00–0.08)
Troponin i, poc: 0.01 ng/mL (ref 0.00–0.08)

## 2017-11-26 LAB — BRAIN NATRIURETIC PEPTIDE: B NATRIURETIC PEPTIDE 5: 47.3 pg/mL (ref 0.0–100.0)

## 2017-11-26 LAB — D-DIMER, QUANTITATIVE (NOT AT ARMC): D DIMER QUANT: 0.32 ug{FEU}/mL (ref 0.00–0.50)

## 2017-11-26 MED ORDER — HYDROCODONE-HOMATROPINE 5-1.5 MG/5ML PO SYRP: 5.0000 mL | ORAL_SOLUTION | Freq: Four times a day (QID) | ORAL | 0 refills | Status: DC | PRN

## 2017-11-26 MED ORDER — AMOXICILLIN-POT CLAVULANATE 875-125 MG PO TABS: 1.0000 | ORAL_TABLET | Freq: Two times a day (BID) | ORAL | 0 refills | Status: DC

## 2017-11-26 MED ORDER — IPRATROPIUM-ALBUTEROL 0.5-2.5 (3) MG/3ML IN SOLN
3.0000 mL | Freq: Once | RESPIRATORY_TRACT | Status: AC
  Administered 2017-11-26: 3 mL via RESPIRATORY_TRACT
  Filled 2017-11-26: qty 3

## 2017-11-26 NOTE — ED Triage Notes (Signed)
Patient states he woke up with ingestion type pain then it went away c/o sob and states enroute to hospital he was having some left arm pain. States he has been seeing his PCP for a cough the last 3 weeks and he hasn't been able to use his c-pap machine at home because he couldn't breath.

## 2017-11-26 NOTE — ED Provider Notes (Signed)
[facility] EMERGENCY DEPARTMENT Provider Note   CSN: [id number] Arrival date & time: 11/26/17  0408     History   Chief Complaint Chief Complaint  Patient presents with  . Chest Pain    HPI Sean Gardner is a 62 y.o. male with history of CAD status post quadruple bypass, hypertension, hyperlipidemia, lung cancer after resection, sleep apnea on CPAP, obesity here for evaluation of burning and "hot" discomfort to the central chest at 4 AM today associated with shortness of breath and sensation of a lump in his throat. Symptoms started when he going to bed and laid back on his wedge pillow. Usually sleeps on a wedge pillow without difficulty. States in the last 3 weeks he has had persistent mildly productive cough, went to urgent care and was given doxycycline.  Currently on day 4 of doxy, without improvement of cough. Denies fevers, chills, current CP, nausea, vomiting, abdominal pain, back pain, LE swelling, calf pain.  Always has orthopnea with laying completely flat. Has not used his CPAP machine in 3 weeks due to night time cough. No recent increase in weight. Usually walks approx 50 yards before he starts to feel SOB.   HPI  Past Medical History:  Diagnosis Date  . Hypertension   . Lung cancer ([place])    lung ca dx 2010  . Myocardial infarction ([place])   . Peripheral vascular disease ([place])   . Shortness of breath   . Sleep apnea    cpap    Patient Active Problem List   Diagnosis Date Noted  . Sepsis secondary to UTI ([place]) 05/11/2016  . CAD (coronary artery disease) 05/11/2016  . Hyperlipidemia 05/11/2016  . Morbid (severe) obesity due to excess calories ([place]) 03/21/2016  . Sleep apnea 09/18/2015  . Dyspnea and respiratory abnormality 09/18/2015  . Dyspnea 10/11/2012  . Essential hypertension 10/11/2012  . Lung cancer ([place]) 03/23/2012    Past Surgical History:  Procedure Laterality Date  . ACHILLES TENDON SURGERY    . CORONARY ARTERY BYPASS GRAFT      . FLEXIBLE SIGMOIDOSCOPY N/A 01/18/2013   Procedure: FLEXIBLE SIGMOIDOSCOPY;  Surgeon: Arta Silence, MD;  Location: [facility];  Service: Endoscopy;  Laterality: N/A;  . HERNIA REPAIR     inguinal  . HOT HEMOSTASIS N/A 01/18/2013   Procedure: HOT HEMOSTASIS (ARGON PLASMA COAGULATION/BICAP);  Surgeon: Arta Silence, MD;  Location: [facility];  Service: Endoscopy;  Laterality: N/A;  . LUNG SURGERY         Home Medications    Prior to Admission medications   Medication Sig Start Date End Date Taking? Authorizing Provider  aspirin 81 MG tablet Take 81 mg by mouth daily.   Yes [provider]  clopidogrel (PLAVIX) 75 MG tablet Take 1 tablet (75 mg total) by mouth daily before breakfast. 01/19/13  Yes Arta Silence, MD  diphenhydramine-acetaminophen (TYLENOL PM) 25-500 MG TABS tablet Take 1 tablet by mouth at bedtime as needed (pain).   Yes [provider]  DM-Doxylamine-Acetaminophen (NYQUIL COLD & FLU PO) Take 15 mLs by mouth at bedtime as needed (cough).   Yes [provider]  doxycycline (ADOXA) 100 MG tablet Take 100 mg by mouth 2 (two) times daily.   Yes [provider]  guaiFENesin (MUCINEX) 600 MG 12 hr tablet Take 600 mg by mouth 2 (two) times daily.   Yes [provider]  lisinopril (PRINIVIL,ZESTRIL) 40 MG tablet Take 1 tablet by mouth daily. 02/06/16  Yes [provider]  metoprolol (LOPRESSOR) 50 MG tablet Take 50 mg by mouth 2 (two) times daily.   Yes [provider]  PROAIR HFA 108 (90 Base) MCG/ACT inhaler Inhale 1 puff into the lungs every 4 (four) hours as needed for wheezing. 11/01/17  Yes [provider]  ranitidine (ZANTAC) 300 MG tablet Take 1 tablet by mouth 2 (two) times daily. 02/26/16  Yes [provider]  rosuvastatin (CRESTOR) 40 MG tablet Take 40 mg by mouth daily.   Yes [provider]  SYMBICORT 160-4.5 MCG/ACT inhaler Inhale 2 puffs into the lungs 2 (two) times daily.  02/07/16  Yes [provider]  Vitamin D, Ergocalciferol, (DRISDOL) 50000 UNITS CAPS Take 1 capsule by mouth once a week.  07/18/12  Yes [provider]  amoxicillin-clavulanate (AUGMENTIN) 875-125 MG tablet Take 1 tablet by mouth every 12 (twelve) hours. 11/26/17   Kinnie Feil, PA-C  HYDROcodone-homatropine (HYCODAN) 5-1.5 MG/5ML syrup Take 5 mLs by mouth every 6 (six) hours as needed for cough. FOR DISRUPTIVE NIGHT TIME COUGH AND BODY ACHES 11/26/17   Kinnie Feil, PA-C  irbesartan (AVAPRO) 300 MG tablet Take 1 tablet (300 mg total) by mouth daily. Patient not taking: Reported on 11/26/2017 03/31/16   Tanda Rockers, MD  lactobacillus (FLORANEX/LACTINEX) PACK Take 1 packet (1 g total) by mouth 3 (three) times daily with meals. Patient not taking: Reported on 11/26/2017 05/15/16   Florencia Reasons, MD  tamsulosin (FLOMAX) 0.4 MG CAPS capsule Take 1 capsule (0.4 mg total) by mouth daily after supper. Patient not taking: Reported on 11/26/2017 05/14/16   Florencia Reasons, MD    Family History Family History  Problem Relation Age of Onset  . Lung cancer Father     Social History Social History   Tobacco Use  . Smoking status: Former Smoker    Packs/day: 2.00    Years: 20.00    Pack years: 40.00    Types: Cigarettes    Last attempt to quit: 10/20/2007    Years since quitting: 10.1  . Smokeless tobacco: Never Used  Substance Use Topics  . Alcohol use: Yes    Comment: Occassionaly  . Drug use: No     Allergies   Zolpidem tartrate   Review of Systems Review of Systems  Respiratory: Positive for cough, choking and shortness of breath.   Cardiovascular: Positive for chest pain.  All other systems reviewed and are negative.    Physical Exam Updated Vital Signs BP (!) 161/74   Pulse 74   Temp 97.9 F (36.6 C) (Oral)   Resp (!) 27   Ht 6' (1.829 m)   Wt 127 kg (280 lb)   SpO2 98%   BMI 37.97 kg/m   Physical Exam  Constitutional: He appears well-developed and  well-nourished.  NAD. Non toxic.   HENT:  Head: Normocephalic and atraumatic.  Nose: Nose normal.  Moist mucous membranes. Large tongue, visualized top of oropharynx and tonsils which appear normal.   Eyes: Conjunctivae, EOM and lids are normal.  Neck: Trachea normal and normal range of motion.  Neck is supple. Trachea midline. Unable to check for JVD due to short neck and body habitus  Cardiovascular: Normal rate, regular rhythm, S1 normal, S2 normal and normal heart sounds.  Pulses:      Carotid pulses are 2+ on the right side, and 2+ on the left side.      Radial pulses are 2+ on the right side, and 2+ on the left side.  Dorsalis pedis pulses are 2+ on the right side, and 2+ on the left side.  RRR. No LE edema or calf tenderness.   Pulmonary/Chest: Effort normal and breath sounds normal. No respiratory distress. He has no decreased breath sounds. He has no rhonchi.  No reproducible chest wall tenderness. CP not reproducible with AROM of upper extremities. No rales or wheezing.  Abdominal: Soft. Bowel sounds are normal. There is no tenderness.  No epigastric tenderness. No distention.   Neurological: He is alert. GCS eye subscore is 4. GCS verbal subscore is 5. GCS motor subscore is 6.  Skin: Skin is warm and dry. Capillary refill takes less than 2 seconds.  No rash to chest wall  Psychiatric: He has a normal mood and affect. His speech is normal and behavior is normal. Judgment and thought content normal. Cognition and memory are normal.     ED Treatments / Results  Labs (all labs ordered are listed, but only abnormal results are displayed) Labs Reviewed  BASIC METABOLIC PANEL - Abnormal; Notable for the following components:      Result Value   CO2 19 (*)    Glucose, Bld 108 (*)    All other components within normal limits  CBC  D-DIMER, QUANTITATIVE (NOT AT [facility])  BRAIN NATRIURETIC PEPTIDE  I-STAT TROPONIN, ED  I-STAT TROPONIN, ED  I-STAT TROPONIN, ED    EKG  EKG  Interpretation  Date/Time:  Friday November 26 2017 04:12:14 EST Ventricular Rate:  73 PR Interval:  174 QRS Duration: 98 QT Interval:  406 QTC Calculation: 447 R Axis:   -62 Text Interpretation:  Normal sinus rhythm Left anterior fascicular block ST & T wave abnormality, consider inferior ischemia ST & T wave abnormality, consider anterolateral ischemia Abnormal ECG changed from prior EKG Confirmed by Malvin Johns [phone]) on 11/26/2017 9:58:00 AM       Radiology Dg Chest 2 View  Result Date: 11/26/2017 CLINICAL DATA:  Acute onset of shortness of breath and subacute onset of cough. EXAM: CHEST  2 VIEW COMPARISON:  Chest radiograph performed 05/11/2016 FINDINGS: The lungs are well-aerated. Mild left basilar scarring is noted. There is no evidence of pleural effusion or pneumothorax. The heart is borderline enlarged. The patient is status post median sternotomy, with evidence of prior CABG. No acute osseous abnormalities are seen. IMPRESSION: Borderline cardiomegaly.  No acute cardiopulmonary process seen. Electronically Signed   By: Garald Balding M.D.   On: 11/26/2017 04:58    Procedures Procedures (including critical care time)  Medications Ordered in ED Medications  ipratropium-albuterol (DUONEB) 0.5-2.5 (3) MG/3ML nebulizer solution 3 mL (3 mLs Nebulization Given 11/26/17 1053)     Initial Impression / Assessment and Plan / ED Course  I have reviewed the triage vital signs and the nursing notes.  Pertinent labs & imaging results that were available during my care of the patient were reviewed by me and considered in my medical decision making (see chart for details).  Clinical Course as of Nov 26 1504  Fri Nov 26, 2017  1005 Normal sinus rhythm Left anterior fascicular block ST & T wave abnormality, consider inferior ischemia ST & T wave abnormality, consider anterolateral ischemia Abnormal ECG changed from prior EKG Confirmed by Malvin Johns [phone]) on 11/26/2017 9:58:00 AM  ED EKG within 10 minutes [CG]  1204 Spoke to Dr Carlisle Beers who will come from [facility] to see pt.   [CG]  1346 Spoke to Dr Nadyne Coombes who evaluated pt in the Ed. Recommends repeat trop and  d/c home with augmentin for likely URI causing cough. Pending trop  [CG]    Clinical Course User Index [CG] Kinnie Feil, PA-C   62 year old male with extensive past cardiac history and sleep apnea here for burning chest discomfort, shortness of breath and choking sensation that began when patient laid down on his wedge pillow to go to sleep. Has had mildly productive cough for 3 weeks. Has not been using CPAP at night in the last 3 weeks due to nighttime cough. Is on day 4 of doxycycline.  No previous h/o acid reflux. Exam is unremarkable. Patient denies current chest pain, shortness of breath but still feels like there is a lump in the back of his throat. He does not look fluid overloaded clinically.  Workup so far remarkable for new ST and T-wave abnormalities in inferior and lateral leads change from previous EKG. Trop 0.01 > 0.02. Pending d-dimer and BNP. Given cardiac history, symptoms, EKG changes will consult cardiology. Noncompliance with CPAP may be contributing.   Final Clinical Impressions(s) / ED Diagnoses   Pt evaluated by Dr Amanda Cockayne, his cardiologist. ST/T wave abnormalities are not new to Dr Einar Gip. He recommends third trop and d/c if normal. High suspicion for dysphagia/GERD etiology contributing.   1505: Third trop negative. Will d/c pt at this time. He has been ambulatory without hypoxia or symptoms.  Final diagnoses:  Chest pain, unspecified type    ED Discharge Orders        Ordered    amoxicillin-clavulanate (AUGMENTIN) 875-125 MG tablet  Every 12 hours     11/26/17 1500    HYDROcodone-homatropine (HYCODAN) 5-1.5 MG/5ML syrup  Every 6 hours PRN     11/26/17 1500       Kinnie Feil, PA-C 11/26/17 1506    Malvin Johns, MD 11/26/17 1512

## 2017-11-26 NOTE — ED Notes (Signed)
Pt ambulated throughout pod c hallway with no assistance. O2 saturation began at 98% and remained between 97%-100% throughout ambulation. Tolerated well, no dizziness, lightheadedness, or shortness of breath noted.

## 2017-11-26 NOTE — Discharge Instructions (Signed)
Dr Einar Gip recommends discharge at this time. Your remaining lab work was normal. Take augmentin for upper respiratory infection. Take hycodan syrup for persistent cough. Note that hycodan has hydrocodone in it which is a narcotic pain medication, be cautious with this medicine as it can cause drowsiness. You can take hycodan syrup especially at night time to help stop your cough.Try to start using your CPAP machine as soon as you are feeling better and cough has improved

## 2017-11-26 NOTE — Consult Note (Signed)
Reason for Consult:Chest pain and abnormal EKG Referring Physician: Carmon Sails, PA-C  Sean Gardner is an 63 y.o. male.  HPI: He had been doing well until about 2-3 weeks ago started having mild fever, chills, cough.  He was seen at urgent care and was prescribed doxycycline.  However he continued to have cough which became productive and greenish, and started to feel much more weak and tired.  Last night he felt extremely fatigued and could not breathe and also noticed burning sensation in his chest and tightness in the middle of the chest.  Hence felt that he needs to be further evaluated and presented to the emergency room.  Also in the past 2-3 weeks he has noticed difficulty in swallowing.  States that it appears that the food gets stuck in the right upper part of his neck or in the upper part of his chest and he has to stand up and drink water to get the food down.  He has history of CABG in 2001 with LIMA to LAD, SVG to diagonal to which is occluded by angiography in September 2011 and also has free radial artery graft OM branch of the circumflex coronary artery. He has history of morbid obesity and obstructive sleep apnea and has been compliant with CPAP. Other history includes mixed hyperlipidemia and hypertension, degenerative joint disease and spinal disease and history of tobacco use disorder in the past which he has quit and has had lung cancer status post chemotherapy and left lower lobe lobectomy in April 2010.  Past Medical History:  Diagnosis Date  . Hypertension   . Lung cancer ([place])    lung ca dx 2010  . Myocardial infarction ([place])   . Peripheral vascular disease ([place])   . Shortness of breath   . Sleep apnea    cpap    Past Surgical History:  Procedure Laterality Date  . ACHILLES TENDON SURGERY    . CORONARY ARTERY BYPASS GRAFT    . FLEXIBLE SIGMOIDOSCOPY N/A 01/18/2013   Procedure: FLEXIBLE SIGMOIDOSCOPY;  Surgeon: Arta Silence, MD;  Location: [facility];   Service: Endoscopy;  Laterality: N/A;  . HERNIA REPAIR     inguinal  . HOT HEMOSTASIS N/A 01/18/2013   Procedure: HOT HEMOSTASIS (ARGON PLASMA COAGULATION/BICAP);  Surgeon: Arta Silence, MD;  Location: [facility];  Service: Endoscopy;  Laterality: N/A;  . LUNG SURGERY      Family History  Problem Relation Age of Onset  . Lung cancer Father     Social History:  reports that he quit smoking about 10 years ago. His smoking use included cigarettes. He has a 40.00 pack-year smoking history. he has never used smokeless tobacco. He reports that he drinks alcohol. He reports that he does not use drugs.  Allergies:  Allergies  Allergen Reactions  . Zolpidem Tartrate Other (See Comments)    Felt fuzzy and hallucinations     Results for orders placed or performed during the hospital encounter of 11/26/17 (from the past 48 hour(s))  Basic metabolic panel     Status: Abnormal   Collection Time: 11/26/17  4:28 AM  Result Value Ref Range   Sodium 138 135 - 145 mmol/L   Potassium 4.1 3.5 - 5.1 mmol/L   Chloride 107 101 - 111 mmol/L   CO2 19 (L) 22 - 32 mmol/L   Glucose, Bld 108 (H) 65 - 99 mg/dL   BUN 13 6 - 20 mg/dL   Creatinine, Ser 0.84 0.61 - 1.24 mg/dL  Calcium 9.9 8.9 - 10.3 mg/dL   GFR calc non Af Amer >60 >60 mL/min   GFR calc Af Amer >60 >60 mL/min    Comment: (NOTE) The eGFR has been calculated using the CKD EPI equation. This calculation has not been validated in all clinical situations. eGFR's persistently <60 mL/min signify possible Chronic Kidney Disease.    Anion gap 12 5 - 15    Comment: Performed at [facility], [address] [place], [place] [address]  CBC     Status: None   Collection Time: 11/26/17  4:28 AM  Result Value Ref Range   WBC 7.5 4.0 - 10.5 K/uL   RBC 4.49 4.22 - 5.81 MIL/uL   Hemoglobin 14.6 13.0 - 17.0 g/dL   HCT 43.8 39.0 - 52.0 %   MCV 97.6 78.0 - 100.0 fL   MCH 32.5 26.0 - 34.0 pg   MCHC 33.3 30.0 - 36.0 g/dL   RDW 14.6 11.5 -  15.5 %   Platelets 267 150 - 400 K/uL    Comment: Performed at [facility], [address] [place], [place] [address]  I-stat troponin, ED     Status: None   Collection Time: 11/26/17  4:40 AM  Result Value Ref Range   Troponin i, poc 0.01 0.00 - 0.08 ng/mL   Comment 3            Comment: Due to the release kinetics of cTnI, a negative result within the first hours of the onset of symptoms does not rule out myocardial infarction with certainty. If myocardial infarction is still suspected, repeat the test at appropriate intervals.   D-dimer, quantitative (not at [facility])     Status: None   Collection Time: 11/26/17 10:44 AM  Result Value Ref Range   D-Dimer, Quant 0.32 0.00 - 0.50 ug/mL-FEU    Comment: (NOTE) At the manufacturer cut-off of 0.50 ug/mL FEU, this assay has been documented to exclude PE with a sensitivity and negative predictive value of 97 to 99%.  At this time, this assay has not been approved by the FDA to exclude DVT/VTE. Results should be correlated with clinical presentation. Performed at [facility], [address] [place], [place] [address]   Brain natriuretic peptide     Status: None   Collection Time: 11/26/17 10:44 AM  Result Value Ref Range   B Natriuretic Peptide 47.3 0.0 - 100.0 pg/mL    Comment: Performed at [facility], [address] [place], [place] [address]  I-Stat Troponin, ED (not at [facility])     Status: None   Collection Time: 11/26/17 10:54 AM  Result Value Ref Range   Troponin i, poc 0.02 0.00 - 0.08 ng/mL   Comment 3            Comment: Due to the release kinetics of cTnI, a negative result within the first hours of the onset of symptoms does not rule out myocardial infarction with certainty. If myocardial infarction is still suspected, repeat the test at appropriate intervals.     Dg Chest 2 View  Result Date: 11/26/2017 CLINICAL DATA:  Acute onset of shortness of breath and subacute onset of cough. EXAM: CHEST  2  VIEW COMPARISON:  Chest radiograph performed 05/11/2016 FINDINGS: The lungs are well-aerated. Mild left basilar scarring is noted. There is no evidence of pleural effusion or pneumothorax. The heart is borderline enlarged. The patient is status post median sternotomy, with evidence of prior   CABG. No acute osseous abnormalities are seen. IMPRESSION: Borderline cardiomegaly.  No acute cardiopulmonary process seen. Electronically Signed   By: Jeffery  Chang M.D.   On: 11/26/2017 04:58    Review of Systems  Constitutional: Positive for malaise/fatigue. Negative for chills, diaphoresis and fever.  HENT: Positive for congestion. Negative for ear pain, hearing loss and tinnitus.   Eyes: Negative.   Respiratory: Positive for cough, sputum production (greenish), shortness of breath and wheezing.   Cardiovascular: Positive for chest pain and orthopnea. Negative for palpitations, claudication, leg swelling and PND.  Gastrointestinal: Positive for heartburn (dyspnagia). Negative for abdominal pain, diarrhea, nausea and vomiting.  Genitourinary: Negative.   Musculoskeletal: Positive for back pain.  Skin: Negative.   Neurological: Positive for weakness. Negative for dizziness.  Endo/Heme/Allergies: Negative.   Psychiatric/Behavioral: Negative.   All other systems reviewed and are negative.  Blood pressure (!) 161/74, pulse 74, temperature 97.9 F (36.6 C), temperature source Oral, resp. rate (!) 27, height 6' (1.829 m), weight 127 kg (280 lb), SpO2 98 %.  Body mass index is 37.97 kg/m.  Physical Exam  Constitutional: He is oriented to person, place, and time.  Patient is well built and moderately obese and in no acute distress.  HENT:  Head: Normocephalic and atraumatic.  Eyes: Conjunctivae are normal.  Neck: Normal range of motion. Neck supple. No JVD present. No tracheal deviation present. No thyromegaly present.  Cardiovascular: Normal rate and regular rhythm. Exam reveals no gallop and no friction  rub.  No murmur heard. Femoral pulses are difficult to feel, popliteal pulses are difficult to feel due to body habitus, pedal pulses are absent bilaterally.  Capillary refill is normal.  Bilateral carotids normal.  Respiratory: Effort normal and breath sounds normal. No respiratory distress. He has no wheezes.  GI: Soft. Bowel sounds are normal.  Obese  Musculoskeletal: Normal range of motion.  Neurological: He is alert and oriented to person, place, and time.  Skin: Skin is warm and dry.  Psychiatric: He has a normal mood and affect.   EKG 11/26/2017: Normal sinus rhythm at rate of 73 bpm, normal axis, diffuse anterolateral deep T wave inversion, cannot exclude subendocardial infarct versus ischemia.  Compared to the EKG done on 10/25/2017 in our office and also on 09/03/2016 in our office, no significant change.  Echocardiogram 11/04/2017: Left ventricle cavity is normal in size. Moderate concentric hypertrophy of the left ventricle. Normal global wall motion. Doppler evidence of grade I (impaired) diastolic dysfunction, normal LAP. Calculated EF 57%. Left atrial cavity is moderately dilated. Right ventricle cavity is mildly dilated. Normal right ventricular function. Mild to moderate mitral regurgitation. Inadequate tricuspid regurgitation jet to estimate pulmonary artery pressure IVC is dilated with a respiratory response of <50%. This may suggest elevated right atrial pressure. No significant change compared to prior study in 2013.  Assessment/Plan: 1.  Chest pain appears to be more consistent with GERD and also dysphagia. 2.  Abnormal EKG with deep anterolateral T wave inversion, unchanged from EKG in 2016.  Negative cardiac markers so far. 3. Coronary artery disease of the native vessel and coronary artery disease of the saphenous vein graft. Coronary angiogram: 08/13/10: CABG 2001. LIMA to LAD,SVG to D2 occluded (9/11) and free radial A. graft to OM  4. Shortness of breath and  dyspnea on exertion, multifactorial, acute upper respiratory infection, underlying obesity and history of left lower lobe lobectomy in 2010. Has h/o lung cancer s/p chemotherapy and left lower lobe lobectomy 01/2009. In remission.  Recommendation:   Patient's symptoms are atypical, would recommend repeat serum troponin and if negative, he can be discharged home.  Would recommend starting him on Augmentin and he will need further GI evaluation for dysphagia.  If the cardiac markers turn positive, obviously will admit the patient.  D-dimer is negative, do not suspect PE and chest x-ray is clear, BNP is also normal.  He has not had a recent nuclear stress test, we will schedule it in the outpatient basis Unless troponin is positive then he will need repeat cardiac catheterization.  I met with his wife and explained in detail.  They understand.  Will call and set up the appointments.   Adrian Prows, MD 11/26/2017, 1:50 PM [facility] [place] Pager: [phone] Office: [phone] If no answer: Cell:  [phone]

## 2017-11-29 DIAGNOSIS — K219 Gastro-esophageal reflux disease without esophagitis: Secondary | ICD-10-CM | POA: Diagnosis not present

## 2017-11-29 DIAGNOSIS — J209 Acute bronchitis, unspecified: Secondary | ICD-10-CM | POA: Diagnosis not present

## 2017-12-20 DIAGNOSIS — I25810 Atherosclerosis of coronary artery bypass graft(s) without angina pectoris: Secondary | ICD-10-CM | POA: Diagnosis not present

## 2017-12-20 DIAGNOSIS — R0789 Other chest pain: Secondary | ICD-10-CM | POA: Diagnosis not present

## 2017-12-23 DIAGNOSIS — I25810 Atherosclerosis of coronary artery bypass graft(s) without angina pectoris: Secondary | ICD-10-CM | POA: Diagnosis not present

## 2017-12-23 DIAGNOSIS — I2510 Atherosclerotic heart disease of native coronary artery without angina pectoris: Secondary | ICD-10-CM | POA: Diagnosis not present

## 2017-12-23 DIAGNOSIS — I10 Essential (primary) hypertension: Secondary | ICD-10-CM | POA: Diagnosis not present

## 2017-12-23 DIAGNOSIS — R0602 Shortness of breath: Secondary | ICD-10-CM | POA: Diagnosis not present

## 2018-02-17 DIAGNOSIS — I10 Essential (primary) hypertension: Secondary | ICD-10-CM | POA: Diagnosis not present

## 2018-02-17 DIAGNOSIS — Z951 Presence of aortocoronary bypass graft: Secondary | ICD-10-CM | POA: Diagnosis not present

## 2018-02-17 DIAGNOSIS — R0602 Shortness of breath: Secondary | ICD-10-CM | POA: Diagnosis not present

## 2018-02-17 DIAGNOSIS — I2510 Atherosclerotic heart disease of native coronary artery without angina pectoris: Secondary | ICD-10-CM | POA: Diagnosis not present

## 2018-03-03 DIAGNOSIS — G629 Polyneuropathy, unspecified: Secondary | ICD-10-CM | POA: Diagnosis not present

## 2018-03-03 DIAGNOSIS — E559 Vitamin D deficiency, unspecified: Secondary | ICD-10-CM | POA: Diagnosis not present

## 2018-03-03 DIAGNOSIS — Z6839 Body mass index (BMI) 39.0-39.9, adult: Secondary | ICD-10-CM | POA: Diagnosis not present

## 2018-03-03 DIAGNOSIS — M5136 Other intervertebral disc degeneration, lumbar region: Secondary | ICD-10-CM | POA: Diagnosis not present

## 2018-03-03 DIAGNOSIS — C3490 Malignant neoplasm of unspecified part of unspecified bronchus or lung: Secondary | ICD-10-CM | POA: Diagnosis not present

## 2018-03-03 DIAGNOSIS — J449 Chronic obstructive pulmonary disease, unspecified: Secondary | ICD-10-CM | POA: Diagnosis not present

## 2018-03-03 DIAGNOSIS — E782 Mixed hyperlipidemia: Secondary | ICD-10-CM | POA: Diagnosis not present

## 2018-03-03 DIAGNOSIS — I2510 Atherosclerotic heart disease of native coronary artery without angina pectoris: Secondary | ICD-10-CM | POA: Diagnosis not present

## 2018-03-03 DIAGNOSIS — I10 Essential (primary) hypertension: Secondary | ICD-10-CM | POA: Diagnosis not present

## 2018-03-03 DIAGNOSIS — M7661 Achilles tendinitis, right leg: Secondary | ICD-10-CM | POA: Diagnosis not present

## 2018-03-03 DIAGNOSIS — G4733 Obstructive sleep apnea (adult) (pediatric): Secondary | ICD-10-CM | POA: Diagnosis not present

## 2018-07-29 DIAGNOSIS — M62838 Other muscle spasm: Secondary | ICD-10-CM | POA: Diagnosis not present

## 2018-07-29 DIAGNOSIS — M940 Chondrocostal junction syndrome [Tietze]: Secondary | ICD-10-CM | POA: Diagnosis not present

## 2018-08-04 ENCOUNTER — Ambulatory Visit
Admission: RE | Admit: 2018-08-04 | Discharge: 2018-08-04 | Disposition: A | Discharge status: Home / Self Care | Payer: 59 | Source: Ambulatory Visit | Attending: Family Medicine | Admitting: Family Medicine

## 2018-08-04 ENCOUNTER — Ambulatory Visit
Admission: RE | Admit: 2018-08-04 | Discharge: 2018-08-04 | Disposition: A | Discharge status: Home / Self Care | Payer: Medicare Other | Source: Ambulatory Visit | Attending: Family Medicine | Admitting: Family Medicine

## 2018-08-04 ENCOUNTER — Other Ambulatory Visit: Payer: Self-pay | Admitting: Family Medicine

## 2018-08-04 DIAGNOSIS — R0789 Other chest pain: Secondary | ICD-10-CM

## 2018-08-04 DIAGNOSIS — R198 Other specified symptoms and signs involving the digestive system and abdomen: Secondary | ICD-10-CM

## 2018-08-04 DIAGNOSIS — C3490 Malignant neoplasm of unspecified part of unspecified bronchus or lung: Secondary | ICD-10-CM | POA: Diagnosis not present

## 2018-08-04 DIAGNOSIS — Z85118 Personal history of other malignant neoplasm of bronchus and lung: Secondary | ICD-10-CM | POA: Diagnosis not present

## 2018-08-04 MED ORDER — IOPAMIDOL (ISOVUE-300) INJECTION 61%
125.0000 mL | Freq: Once | INTRAVENOUS | Status: AC | PRN
  Administered 2018-08-04: 125 mL via INTRAVENOUS

## 2018-08-09 ENCOUNTER — Emergency Department (HOSPITAL_COMMUNITY): Payer: Medicare Other

## 2018-08-09 ENCOUNTER — Emergency Department (HOSPITAL_COMMUNITY)
Admission: EM | Admit: 2018-08-09 | Discharge: 2018-08-09 | Disposition: A | Discharge status: Home / Self Care | Payer: Medicare Other | Attending: Emergency Medicine | Admitting: Emergency Medicine

## 2018-08-09 ENCOUNTER — Encounter (HOSPITAL_COMMUNITY): Payer: Self-pay | Admitting: Emergency Medicine

## 2018-08-09 ENCOUNTER — Other Ambulatory Visit: Payer: Self-pay

## 2018-08-09 DIAGNOSIS — I2510 Atherosclerotic heart disease of native coronary artery without angina pectoris: Secondary | ICD-10-CM | POA: Insufficient documentation

## 2018-08-09 DIAGNOSIS — M546 Pain in thoracic spine: Secondary | ICD-10-CM | POA: Diagnosis not present

## 2018-08-09 DIAGNOSIS — I10 Essential (primary) hypertension: Secondary | ICD-10-CM | POA: Diagnosis not present

## 2018-08-09 DIAGNOSIS — Z87891 Personal history of nicotine dependence: Secondary | ICD-10-CM | POA: Insufficient documentation

## 2018-08-09 DIAGNOSIS — R0789 Other chest pain: Secondary | ICD-10-CM | POA: Insufficient documentation

## 2018-08-09 DIAGNOSIS — Z7982 Long term (current) use of aspirin: Secondary | ICD-10-CM | POA: Diagnosis not present

## 2018-08-09 DIAGNOSIS — Z79899 Other long term (current) drug therapy: Secondary | ICD-10-CM | POA: Insufficient documentation

## 2018-08-09 DIAGNOSIS — M549 Dorsalgia, unspecified: Secondary | ICD-10-CM | POA: Diagnosis present

## 2018-08-09 DIAGNOSIS — R0989 Other specified symptoms and signs involving the circulatory and respiratory systems: Secondary | ICD-10-CM | POA: Diagnosis not present

## 2018-08-09 DIAGNOSIS — R9431 Abnormal electrocardiogram [ECG] [EKG]: Secondary | ICD-10-CM | POA: Diagnosis not present

## 2018-08-09 LAB — I-STAT TROPONIN, ED: Troponin i, poc: 0 ng/mL (ref 0.00–0.08)

## 2018-08-09 LAB — CBC
HEMATOCRIT: 46.1 % (ref 39.0–52.0)
Hemoglobin: 15.4 g/dL (ref 13.0–17.0)
MCH: 32.5 pg (ref 26.0–34.0)
MCHC: 33.4 g/dL (ref 30.0–36.0)
MCV: 97.3 fL (ref 80.0–100.0)
PLATELETS: 262 10*3/uL (ref 150–400)
RBC: 4.74 MIL/uL (ref 4.22–5.81)
RDW: 14.1 % (ref 11.5–15.5)
WBC: 8.8 10*3/uL (ref 4.0–10.5)
nRBC: 0 % (ref 0.0–0.2)

## 2018-08-09 LAB — BASIC METABOLIC PANEL
Anion gap: 6 (ref 5–15)
BUN: 12 mg/dL (ref 8–23)
CALCIUM: 10 mg/dL (ref 8.9–10.3)
CO2: 23 mmol/L (ref 22–32)
Chloride: 110 mmol/L (ref 98–111)
Creatinine, Ser: 0.94 mg/dL (ref 0.61–1.24)
GFR calc Af Amer: >60 mL/min (ref >60)
GFR calc non Af Amer: >60 mL/min (ref >60)
Glucose, Bld: 91 mg/dL (ref 70–99)
Potassium: 4.7 mmol/L (ref 3.5–5.1)
Sodium: 139 mmol/L (ref 135–145)

## 2018-08-09 MED ORDER — LORAZEPAM 1 MG PO TABS
1.0000 mg | ORAL_TABLET | Freq: Once | ORAL | Status: DC
  Filled 2018-08-09: qty 1

## 2018-08-09 MED ORDER — OXYCODONE-ACETAMINOPHEN 5-325 MG PO TABS
1.0000 | ORAL_TABLET | Freq: Once | ORAL | Status: AC
  Administered 2018-08-09: 1 via ORAL
  Filled 2018-08-09: qty 1

## 2018-08-09 NOTE — ED Provider Notes (Signed)
I saw and evaluated the patient, reviewed the resident's note and I agree with the findings and plan with the following exceptions.   62 year old male whose had atraumatic pain that radiates from his back around bilateral sides of his chest across the middle of his chest.  Has been evaluated a couple times by his primary doctor for but did not have time to see him today so they sent him to the emergency room.  Patient has any neurologic symptoms otherwise.  No nausea or vomiting.  Has history of lung cancer which is followed by the [facility] without recurrence. On exam there is no obvious rash in the dermatome of discomfort.  He states is a bandlike pain over his xiphoid process and bilaterally.  Abdominal tenderness.  Has some paraspinal tenderness in the back but no midline tenderness to palpation.  No neurologic changes. Patient had a CT of his chest and abdomen and pelvis.  With it being atraumatic the only other thing (aside from [facility]) I can think of would be pancreatitis vs spinal lesion with history of cancer, will MRI for same. Add on lipase.    EKG Interpretation  Date/Time:  Tuesday August 09 2018 15:33:36 EDT Ventricular Rate:  67 PR Interval:  194 QRS Duration: 96 QT Interval:  424 QTC Calculation: 448 R Axis:   -19 Text Interpretation:  Normal sinus rhythm Incomplete right bundle branch block ST & Marked T wave abnormality, consider anterolateral ischemia Abnormal ECG No significant change was found Confirmed by Jola Schmidt [phone]) on 08/09/2018 3:42:17 PM         Ondrea Dow, Corene Cornea, MD 08/10/18 2354

## 2018-08-09 NOTE — ED Provider Notes (Signed)
Patient placed in Quick Look pathway, seen and evaluated   Chief Complaint: chest pain  HPI:   Sean Gardner is a 62 y.o. male persistent pain across the front of his chest and over the posterior lower ribs bilaterally.  This pain has been present for the last few weeks.  Patient does have a history of a left lower lobectomy in the setting of metastatic lung cancer but this was done in 2010.  Patient saw his regular doctor regarding this pain last week and had CT of the chest abdomen and pelvis done which showed no obvious cause for his pain, he reports over the past week persistent and severe, he has been taking Robaxin and Vicodin without relief.  PCP has not been able to find cause for pain.  ROS: + Chest pain, back pain. -Fever, cough, syncope, abdominal pain  Physical Exam:   Gen: No distress  Neuro: Awake and Alert  Skin: Warm    Focused Exam: Tenderness to palpation across the front chest and bilateral posterior ribs without overlying skin changes palpable deformity.  Lungs clear to auscultation with lung sounds absent in the left lower lobe S/P lobectomy, heart RRR, abdomen nontender to palpation.   Initiation of care has begun. The patient has been counseled on the process, plan, and necessity for staying for the completion/evaluation, and the remainder of the medical screening examination    Janet Berlin 08/09/18 1606    Mesner, Corene Cornea, MD 08/09/18 2302

## 2018-08-09 NOTE — ED Notes (Signed)
Pt called out needing assistance.  This RN answered need, pt requesting monitoring to be taken off because he is going to Dr. Rockwell [place] to be able to get out patient MRI across the street recommended by his daughter

## 2018-08-09 NOTE — ED Notes (Signed)
Pt leaving AMA, discharge instructions and follow up care reviewed and understood.

## 2018-08-09 NOTE — ED Triage Notes (Signed)
Pt here for eval of upper back/ribs and chest soreness, feels like someone is punching him. Denies injury. Pt states the pain is intermittent. Pain is currently 8/10.

## 2018-08-09 NOTE — ED Notes (Signed)
Pt refused MRI.

## 2018-08-09 NOTE — ED Provider Notes (Signed)
[facility] EMERGENCY DEPARTMENT Provider Note   CSN: [id number] Arrival date & time: 08/09/18  1523     History   Chief Complaint Chief Complaint  Patient presents with  . Back Pain  . Chest Pain    HPI Sean Gardner is a 62 y.o. male.  HPI Patient is a 62 year old male with a past medical history of MI status post four-vessel CABG and lung cancer that is post lobectomy and reportedly in remission who presents the emerge department for evaluation of back pain and chest pain.  Patient reports that he has been experiencing this pain for approximately 3 weeks.  Patient reports that the pain is on his bilateral lower chest and is worsened with any movement or palpation over the area.  Patient has been worked up by his primary care physician who has done labs and imaging studies that to this point have been unremarkable.  Patient has had a CT abdomen pelvis performed that reportedly did not show any evidence of acute process that would explain his current pain.  Outside of the symptoms, patient denies any exertional component to his pain or pleuritic component to his pain, history of DVT, fevers, chills, nausea, vomiting, diarrhea, constipation, or headaches.  His remaining review of systems is negative at this time.  Past Medical History:  Diagnosis Date  . Hypertension   . Lung cancer ([place])    lung ca dx 2010  . Myocardial infarction ([place])   . Peripheral vascular disease ([place])   . Shortness of breath   . Sleep apnea    cpap    Patient Active Problem List   Diagnosis Date Noted  . Sepsis secondary to UTI ([place]) 05/11/2016  . CAD (coronary artery disease) 05/11/2016  . Hyperlipidemia 05/11/2016  . Morbid (severe) obesity due to excess calories ([place]) 03/21/2016  . Sleep apnea 09/18/2015  . Dyspnea and respiratory abnormality 09/18/2015  . Dyspnea 10/11/2012  . Essential hypertension 10/11/2012  . Lung cancer ([place]) 03/23/2012    Past Surgical History:    Procedure Laterality Date  . ACHILLES TENDON SURGERY    . CORONARY ARTERY BYPASS GRAFT    . FLEXIBLE SIGMOIDOSCOPY N/A 01/18/2013   Procedure: FLEXIBLE SIGMOIDOSCOPY;  Surgeon: Arta Silence, MD;  Location: [facility] ENDOSCOPY;  Service: Endoscopy;  Laterality: N/A;  . HERNIA REPAIR     inguinal  . HOT HEMOSTASIS N/A 01/18/2013   Procedure: HOT HEMOSTASIS (ARGON PLASMA COAGULATION/BICAP);  Surgeon: Arta Silence, MD;  Location: [facility];  Service: Endoscopy;  Laterality: N/A;  . LUNG SURGERY          Home Medications    Prior to Admission medications   Medication Sig Start Date End Date Taking? Authorizing Provider  amLODipine (NORVASC) 5 MG tablet Take 5 mg by mouth daily. 07/21/18  Yes [provider]  aspirin 81 MG tablet Take 81 mg by mouth daily.   Yes [provider]  clopidogrel (PLAVIX) 75 MG tablet Take 1 tablet (75 mg total) by mouth daily before breakfast. 01/19/13  Yes Arta Silence, MD  diphenhydramine-acetaminophen (TYLENOL PM) 25-500 MG TABS tablet Take 1 tablet by mouth at bedtime as needed (pain).   Yes [provider]  lisinopril (PRINIVIL,ZESTRIL) 40 MG tablet Take 40 mg by mouth daily.  02/06/16  Yes [provider]  methocarbamol (ROBAXIN) 500 MG tablet Take 750 mg by mouth every 4 (four) hours as needed for spasms. 08/04/18  Yes [provider]  metoprolol (LOPRESSOR) 50 MG tablet Take  50 mg by mouth 2 (two) times daily.   Yes [provider]  rosuvastatin (CRESTOR) 40 MG tablet Take 40 mg by mouth daily.   Yes [provider]  SYMBICORT 160-4.5 MCG/ACT inhaler Inhale 2 puffs into the lungs 2 (two) times daily. 02/07/16  Yes [provider]  Vitamin D, Ergocalciferol, (DRISDOL) 50000 UNITS CAPS Take 50,000 Units by mouth every Saturday.    Yes [provider]  HYDROcodone-homatropine (HYCODAN) 5-1.5 MG/5ML syrup Take 5 mLs by mouth every 6 (six) hours as needed for cough. FOR DISRUPTIVE NIGHT  TIME COUGH AND BODY ACHES Patient not taking: Reported on 08/09/2018 11/26/17   Kinnie Feil, PA-C  irbesartan (AVAPRO) 300 MG tablet Take 1 tablet (300 mg total) by mouth daily. Patient not taking: Reported on 11/26/2017 03/31/16   Tanda Rockers, MD  lactobacillus (FLORANEX/LACTINEX) PACK Take 1 packet (1 g total) by mouth 3 (three) times daily with meals. Patient not taking: Reported on 11/26/2017 05/15/16   Florencia Reasons, MD  tamsulosin (FLOMAX) 0.4 MG CAPS capsule Take 1 capsule (0.4 mg total) by mouth daily after supper. Patient not taking: Reported on 11/26/2017 05/14/16   Florencia Reasons, MD    Family History Family History  Problem Relation Age of Onset  . Lung cancer Father     Social History Social History   Tobacco Use  . Smoking status: Former Smoker    Packs/day: 2.00    Years: 20.00    Pack years: 40.00    Types: Cigarettes    Last attempt to quit: 10/20/2007    Years since quitting: 10.8  . Smokeless tobacco: Never Used  Substance Use Topics  . Alcohol use: Yes    Comment: Occassionaly  . Drug use: No     Allergies   Zolpidem tartrate   Review of Systems Review of Systems  Constitutional: Negative for chills and fever.  HENT: Negative for ear pain and sore throat.   Eyes: Negative for pain and visual disturbance.  Respiratory: Negative for cough and shortness of breath.   Cardiovascular: Positive for chest pain. Negative for palpitations.  Gastrointestinal: Positive for abdominal pain (upper abdomen, intermittent, and not present currently). Negative for vomiting.  Genitourinary: Negative for dysuria and hematuria.  Musculoskeletal: Positive for back pain. Negative for arthralgias.  Skin: Negative for color change and rash.  Allergic/Immunologic: Negative for environmental allergies and food allergies.  Neurological: Negative for seizures and syncope.  Psychiatric/Behavioral: Negative for agitation, behavioral problems and confusion.  All other systems reviewed and  are negative.    Physical Exam Updated Vital Signs BP 121/71   Pulse 66   Temp 98 F (36.7 C) (Oral)   Resp 19   Ht 6' (1.829 m)   Wt 127 kg   SpO2 97%   BMI 37.97 kg/m   Physical Exam  Constitutional: He appears well-developed and well-nourished.  HENT:  Head: Normocephalic and atraumatic.  Eyes: Conjunctivae are normal.  Neck: Neck supple.  Cardiovascular: Normal rate and regular rhythm.  No murmur heard. Pulmonary/Chest: Effort normal and breath sounds normal. No respiratory distress. He exhibits tenderness (Patient with severe tenderness to palpation over his bilateral lower chest.  He also have some tenderness to palpation into his axilla.).  Abdominal: Soft. There is no tenderness.  Musculoskeletal: He exhibits no edema.  Patient with tenderness to palpation in his lower back.  Tenderness is worse in patient's flanks, but there is mild midline tenderness as well.  Neurological: He is alert.  Patient  denies any sensory changes or weakness in his bilateral upper and lower extremities.   Skin: Skin is warm and dry.  Patient with lesions on his bilateral upper extremities concerning for actinic keratosis.  Patient is being followed by a physician at the [facility] regarding these lesions.  Psychiatric: He has a normal mood and affect.  Nursing note and vitals reviewed.    ED Treatments / Results  Labs (all labs ordered are listed, but only abnormal results are displayed) Labs Reviewed  BASIC METABOLIC PANEL  CBC  I-STAT TROPONIN, ED    EKG EKG Interpretation  Date/Time:  Tuesday August 09 2018 15:33:36 EDT Ventricular Rate:  67 PR Interval:  194 QRS Duration: 96 QT Interval:  424 QTC Calculation: 448 R Axis:   -19 Text Interpretation:  Normal sinus rhythm Incomplete right bundle branch block ST & Marked T wave abnormality, consider anterolateral ischemia Abnormal ECG No significant change was found Confirmed by Jola Schmidt [phone]) on 08/09/2018 3:42:17  PM   Radiology Dg Chest 2 View  Result Date: 08/09/2018 CLINICAL DATA:  Soreness of the chest and upper back and ribs, no history of injury, history of prior left lower lobectomy for lung carcinoma EXAM: CHEST - 2 VIEW COMPARISON:  Chest x-ray of 11/26/2016 and CT chest of 08/04/2017 FINDINGS: No active infiltrate or effusion is seen. Mediastinal and hilar contours are unremarkable. Median sternotomy sutures are present from prior CABG. Cardiomegaly is stable. No acute rib fracture is seen. There are degenerative changes in the mid to lower thoracic spine. IMPRESSION: 1. No evidence of rib fracture. Mild degenerative change in the mid to lower thoracic spine. 2. Stable cardiomegaly with changes of CABG. Electronically Signed   By: Ivar Drape M.D.   On: 08/09/2018 16:32    Procedures Procedures (including critical care time)  Medications Ordered in ED Medications  oxyCODONE-acetaminophen (PERCOCET/ROXICET) 5-325 MG per tablet 1 tablet (1 tablet Oral Given 08/09/18 1614)     Initial Impression / Assessment and Plan / ED Course  I have reviewed the triage vital signs and the nursing notes.  Pertinent labs & imaging results that were available during my care of the patient were reviewed by me and considered in my medical decision making (see chart for details).     Patient is a 61 year old male with past medical history as detailed above who presents emergency department for evaluation of back pain that radiates to his bilateral flanks and into his anterior chest.  Patient reports his pain is been ongoing for 3 weeks.  Patient has been evaluated at an outside facility for this same complaint and had CT imaging done which was reportedly negative.  Patient has had continuation of his symptoms, and as result came to the emergency department for further evaluation.  Upon arrival to the emergency department, patient does appear to be in pain with movement, but is otherwise hemodynamically stable in  no acute distress.  Patient's laboratory studies are overall unremarkable.  Patient's EKG results are detailed above, but does not show any acute changes from previous.  Chest x-ray does not show evidence of acute cardiac or pulmonary abnormality to explain patient's pain.  Given the fact the patient is already had a CT study performed prior to arrival to the emergency department that was reportedly negative, our plan was to obtain an MRI patient's thoracic spine to further evaluate for possible cause of patient's pain.  Unfortunately, patient did not believe that he will be able to tolerate this and elected  to forego imaging at this time.  He states that he will follow-up with his primary care physician to set up outpatient MRI as he has done this in the past. Patient given return precautions. Patient in no acute distress at the time of discharge.   At this time, the exact etiology of patient's pain is unclear.  He has no evidence of acute cardiac cause of his pain.  There is no evidence on CT of rib fractures or pulmonary pathology to explain his symptoms.  No overlying skin changes present in the distribution of patient's pain.  He does have tenderness to palpation in his thoracic spine and his pain does seem to follow a dermatomal pattern making possible spinal cord/nerve root impingement possible.  This will be further evaluated on MRI but patient is declining this imaging study at this time.  Final Clinical Impressions(s) / ED Diagnoses   Final diagnoses:  Chest wall pain  Bilateral thoracic back pain, unspecified chronicity    ED Discharge Orders    None       Cassondra Stachowski, Chanda Busing, MD 08/10/18 2150    Merrily Pew, MD 08/10/18 2354

## 2018-08-09 NOTE — ED Notes (Signed)
Pt sitting on side of bed.  Ativan offered, patient refusing MRI scan.  Pt calling daughter to find out where his outpatient MRI was done because he cant have it done here and she used to work at [employer].

## 2018-08-12 ENCOUNTER — Other Ambulatory Visit: Payer: Self-pay | Admitting: Family Medicine

## 2018-08-12 DIAGNOSIS — M546 Pain in thoracic spine: Secondary | ICD-10-CM

## 2018-08-12 DIAGNOSIS — Z85118 Personal history of other malignant neoplasm of bronchus and lung: Secondary | ICD-10-CM

## 2018-08-16 ENCOUNTER — Ambulatory Visit
Admission: RE | Admit: 2018-08-16 | Discharge: 2018-08-16 | Disposition: A | Discharge status: Home / Self Care | Payer: Medicare Other | Source: Ambulatory Visit | Attending: Family Medicine | Admitting: Family Medicine

## 2018-08-16 DIAGNOSIS — Z85118 Personal history of other malignant neoplasm of bronchus and lung: Secondary | ICD-10-CM

## 2018-08-16 DIAGNOSIS — M546 Pain in thoracic spine: Secondary | ICD-10-CM

## 2018-08-16 DIAGNOSIS — M4804 Spinal stenosis, thoracic region: Secondary | ICD-10-CM | POA: Diagnosis not present

## 2018-08-16 MED ORDER — GADOBENATE DIMEGLUMINE 529 MG/ML IV SOLN
20.0000 mL | Freq: Once | INTRAVENOUS | Status: AC | PRN
  Administered 2018-08-16: 20 mL via INTRAVENOUS

## 2018-08-21 ENCOUNTER — Other Ambulatory Visit: Payer: 59

## 2018-08-24 DIAGNOSIS — M546 Pain in thoracic spine: Secondary | ICD-10-CM | POA: Diagnosis not present

## 2018-08-25 DIAGNOSIS — I10 Essential (primary) hypertension: Secondary | ICD-10-CM | POA: Diagnosis not present

## 2018-08-25 DIAGNOSIS — I2510 Atherosclerotic heart disease of native coronary artery without angina pectoris: Secondary | ICD-10-CM | POA: Diagnosis not present

## 2018-08-25 DIAGNOSIS — R0602 Shortness of breath: Secondary | ICD-10-CM | POA: Diagnosis not present

## 2018-08-25 DIAGNOSIS — Z951 Presence of aortocoronary bypass graft: Secondary | ICD-10-CM | POA: Diagnosis not present

## 2018-08-26 ENCOUNTER — Telehealth: Payer: Self-pay | Admitting: Medical Oncology

## 2018-08-26 NOTE — Telephone Encounter (Signed)
Sean Gardner, dtr, wants pt to see Dr Julien Nordmann -She said Dr Emiliano Dyer wanted him referred to Dr Julien Nordmann after abnormal MRI thoracic spine.

## 2018-08-29 ENCOUNTER — Telehealth: Payer: Self-pay | Admitting: Internal Medicine

## 2018-08-29 ENCOUNTER — Telehealth: Payer: Self-pay | Admitting: Medical Oncology

## 2018-08-29 NOTE — Telephone Encounter (Signed)
appt confimed with pt.

## 2018-08-29 NOTE — Telephone Encounter (Signed)
Scheduled appt per 11/11 sch message - pt is aware of appt date and time.

## 2018-09-05 ENCOUNTER — Other Ambulatory Visit: Payer: Self-pay | Admitting: *Deleted

## 2018-09-05 ENCOUNTER — Inpatient Hospital Stay: Payer: Medicare Other | Attending: Internal Medicine | Admitting: Internal Medicine

## 2018-09-05 ENCOUNTER — Encounter: Payer: Self-pay | Admitting: Internal Medicine

## 2018-09-05 ENCOUNTER — Inpatient Hospital Stay: Payer: Medicare Other

## 2018-09-05 ENCOUNTER — Telehealth: Payer: Self-pay | Admitting: Internal Medicine

## 2018-09-05 VITALS — BP 133/85 | HR 79 | Temp 97.6°F | Resp 18 | Ht 72.0 in | Wt 284.0 lb

## 2018-09-05 DIAGNOSIS — Z951 Presence of aortocoronary bypass graft: Secondary | ICD-10-CM | POA: Diagnosis not present

## 2018-09-05 DIAGNOSIS — Z79899 Other long term (current) drug therapy: Secondary | ICD-10-CM | POA: Insufficient documentation

## 2018-09-05 DIAGNOSIS — C3490 Malignant neoplasm of unspecified part of unspecified bronchus or lung: Secondary | ICD-10-CM

## 2018-09-05 DIAGNOSIS — C3432 Malignant neoplasm of lower lobe, left bronchus or lung: Secondary | ICD-10-CM | POA: Insufficient documentation

## 2018-09-05 DIAGNOSIS — Z9221 Personal history of antineoplastic chemotherapy: Secondary | ICD-10-CM | POA: Insufficient documentation

## 2018-09-05 DIAGNOSIS — I10 Essential (primary) hypertension: Secondary | ICD-10-CM | POA: Insufficient documentation

## 2018-09-05 DIAGNOSIS — Z902 Acquired absence of lung [part of]: Secondary | ICD-10-CM | POA: Diagnosis not present

## 2018-09-05 DIAGNOSIS — Z7982 Long term (current) use of aspirin: Secondary | ICD-10-CM | POA: Insufficient documentation

## 2018-09-05 DIAGNOSIS — C7951 Secondary malignant neoplasm of bone: Secondary | ICD-10-CM | POA: Insufficient documentation

## 2018-09-05 LAB — CMP (CANCER CENTER ONLY)
ALK PHOS: 60 U/L (ref 38–126)
ALT: 60 U/L — AB (ref 0–44)
AST: 48 U/L — AB (ref 15–41)
Albumin: 3.9 g/dL (ref 3.5–5.0)
Anion gap: 7 (ref 5–15)
BILIRUBIN TOTAL: 0.7 mg/dL (ref 0.3–1.2)
BUN: 16 mg/dL (ref 8–23)
CALCIUM: 10.8 mg/dL — AB (ref 8.9–10.3)
CHLORIDE: 110 mmol/L (ref 98–111)
CO2: 25 mmol/L (ref 22–32)
CREATININE: 1.01 mg/dL (ref 0.61–1.24)
Glucose, Bld: 96 mg/dL (ref 70–99)
Potassium: 4.5 mmol/L (ref 3.5–5.1)
Sodium: 142 mmol/L (ref 135–145)
Total Protein: 7.2 g/dL (ref 6.5–8.1)

## 2018-09-05 LAB — CBC WITH DIFFERENTIAL (CANCER CENTER ONLY)
Abs Immature Granulocytes: 0.04 10*3/uL (ref 0.00–0.07)
Basophils Absolute: 0.1 10*3/uL (ref 0.0–0.1)
Basophils Relative: 1 %
EOS ABS: 0.2 10*3/uL (ref 0.0–0.5)
EOS PCT: 3 %
HEMATOCRIT: 48.3 % (ref 39.0–52.0)
HEMOGLOBIN: 16.1 g/dL (ref 13.0–17.0)
Immature Granulocytes: 1 %
LYMPHS PCT: 18 %
Lymphs Abs: 1.2 10*3/uL (ref 0.7–4.0)
MCH: 32.4 pg (ref 26.0–34.0)
MCHC: 33.3 g/dL (ref 30.0–36.0)
MCV: 97.2 fL (ref 80.0–100.0)
Monocytes Absolute: 0.6 10*3/uL (ref 0.1–1.0)
Monocytes Relative: 8 %
NRBC: 0 % (ref 0.0–0.2)
Neutro Abs: 4.7 10*3/uL (ref 1.7–7.7)
Neutrophils Relative %: 69 %
Platelet Count: 236 10*3/uL (ref 150–400)
RBC: 4.97 MIL/uL (ref 4.22–5.81)
RDW: 14.4 % (ref 11.5–15.5)
WBC: 6.8 10*3/uL (ref 4.0–10.5)

## 2018-09-05 NOTE — Progress Notes (Signed)
[facility] [phone]   [phone]  OFFICE PROGRESS NOTE  [name], MD [place] [id number]  PRINCIPAL DIAGNOSIS: Stage IIA (T1 N1 MX) non-small cell lung cancer, presented with low grade neuroendocrine carcinoma diagnosed in March 2010.   PRIOR THERAPY:  1. Status post left lower lobectomy under the care of Dr. Roxy Manns on 01/29/2009. 2. Status post 4 cycles of adjuvant chemotherapy. First cycle was given with cisplatin and docetaxel, discontinued secondary to intolerance. The patient completed 3 more cycles with carboplatin and docetaxel. Last dose was given 05/27/2009.  CURRENT THERAPY: Observation.  INTERVAL HISTORY: Sean Gardner 62 y.o. male returns to the clinic today for a follow-up visit.  The patient was last seen around 5 years ago.  He was supposed to come back for follow-up visit in 1 year but he missed several of his appointment.  He has been complaining recently of generalized pain including the chest and back as well as abdomen.  He denied having any shortness of breath except with exertion with no cough or hemoptysis.  He denied having any recent weight loss or night sweats.  He has no headache or visual changes.  He had repeat CT scan of the chest, abdomen and pelvis performed recently that were unremarkable for disease recurrence but MRI of the thoracic spines showed suspicious bone lesions.  The patient is here today for reevaluation and recommendation regarding his condition.  MEDICAL HISTORY: Past Medical History:  Diagnosis Date  . Hypertension   . Lung cancer ([place])    lung ca dx 2010  . Myocardial infarction ([place])   . Peripheral vascular disease ([place])   . Shortness of breath   . Sleep apnea    cpap    ALLERGIES:  is allergic to zolpidem tartrate.  MEDICATIONS:  Current Outpatient Medications  Medication Sig Dispense Refill  . amLODipine (NORVASC) 5 MG tablet Take 5 mg by mouth  daily.  2  . aspirin 81 MG tablet Take 81 mg by mouth daily.    . clopidogrel (PLAVIX) 75 MG tablet Take 1 tablet (75 mg total) by mouth daily before breakfast. 1 tablet 0  . diphenhydramine-acetaminophen (TYLENOL PM) 25-500 MG TABS tablet Take 1 tablet by mouth at bedtime as needed (pain).    Marland Kitchen HYDROcodone-homatropine (HYCODAN) 5-1.5 MG/5ML syrup Take 5 mLs by mouth every 6 (six) hours as needed for cough. FOR DISRUPTIVE NIGHT TIME COUGH AND BODY ACHES (Patient not taking: Reported on 08/09/2018) 120 mL 0  . irbesartan (AVAPRO) 300 MG tablet Take 1 tablet (300 mg total) by mouth daily. (Patient not taking: Reported on 11/26/2017) 30 tablet 0  . lactobacillus (FLORANEX/LACTINEX) PACK Take 1 packet (1 g total) by mouth 3 (three) times daily with meals. (Patient not taking: Reported on 11/26/2017) 30 packet 0  . lisinopril (PRINIVIL,ZESTRIL) 40 MG tablet Take 40 mg by mouth daily.     . methocarbamol (ROBAXIN) 500 MG tablet Take 750 mg by mouth every 4 (four) hours as needed for spasms.  0  . metoprolol (LOPRESSOR) 50 MG tablet Take 50 mg by mouth 2 (two) times daily.    . rosuvastatin (CRESTOR) 40 MG tablet Take 40 mg by mouth daily.    . SYMBICORT 160-4.5 MCG/ACT inhaler Inhale 2 puffs into the lungs 2 (two) times daily.    . tamsulosin (FLOMAX) 0.4 MG CAPS capsule Take 1 capsule (0.4 mg total) by mouth daily after supper. (Patient not  taking: Reported on 11/26/2017) 30 capsule 0  . Vitamin D, Ergocalciferol, (DRISDOL) 50000 UNITS CAPS Take 50,000 Units by mouth every Saturday.      No current facility-administered medications for this visit.     REVIEW OF SYSTEMS:  Constitutional: positive for fatigue Eyes: negative Ears, nose, mouth, throat, and face: negative Respiratory: positive for dyspnea on exertion Cardiovascular: negative Gastrointestinal: negative Genitourinary:negative Integument/breast: negative Hematologic/lymphatic: negative Musculoskeletal:positive for bone pain Neurological:  negative Behavioral/Psych: negative Endocrine: negative Allergic/Immunologic: negative   PHYSICAL EXAMINATION: General appearance: alert, cooperative, fatigued and no distress Head: Normocephalic, without obvious abnormality, atraumatic Neck: no adenopathy Lymph nodes: Cervical, supraclavicular, and axillary nodes normal. Resp: clear to auscultation bilaterally Back: symmetric, no curvature. ROM normal. No CVA tenderness. Cardio: regular rate and rhythm, S1, S2 normal, no murmur, click, rub or gallop GI: soft, non-tender; bowel sounds normal; no masses,  no organomegaly Extremities: extremities normal, atraumatic, no cyanosis or edema Neurologic: Alert and oriented X 3, normal strength and tone. Normal symmetric reflexes. Normal coordination and gait  ECOG PERFORMANCE STATUS: 1 - Symptomatic but completely ambulatory  Blood pressure 133/85, pulse 79, temperature 97.6 F (36.4 C), temperature source Oral, resp. rate 18, height 6' (1.829 m), weight 284 lb (128.8 kg), SpO2 97 %.  LABORATORY DATA: Lab Results  Component Value Date   WBC 6.8 09/05/2018   HGB 16.1 09/05/2018   HCT 48.3 09/05/2018   MCV 97.2 09/05/2018   PLT 236 09/05/2018      Chemistry      Component Value Date/Time   NA 139 08/09/2018 1545   NA 140 10/27/2013 0947   K 4.7 08/09/2018 1545   K 4.4 10/27/2013 0947   CL 110 08/09/2018 1545   CL 110 (H) 09/27/2012 1157   CO2 23 08/09/2018 1545   CO2 23 10/27/2013 0947   BUN 12 08/09/2018 1545   BUN 13.4 10/27/2013 0947   CREATININE 0.94 08/09/2018 1545   CREATININE 0.8 10/27/2013 0947      Component Value Date/Time   CALCIUM 10.0 08/09/2018 1545   CALCIUM 9.6 10/27/2013 0947   ALKPHOS 40 05/12/2016 0751   ALKPHOS 57 10/27/2013 0947   AST 23 05/12/2016 0751   AST 51 (H) 10/27/2013 0947   ALT 30 05/12/2016 0751   ALT 94 (H) 10/27/2013 0947   BILITOT 1.1 05/12/2016 0751   BILITOT 0.69 10/27/2013 0947       RADIOGRAPHIC STUDIES: Dg Chest 2  View  Result Date: 08/09/2018 CLINICAL DATA:  Soreness of the chest and upper back and ribs, no history of injury, history of prior left lower lobectomy for lung carcinoma EXAM: CHEST - 2 VIEW COMPARISON:  Chest x-ray of 11/26/2016 and CT chest of 08/04/2017 FINDINGS: No active infiltrate or effusion is seen. Mediastinal and hilar contours are unremarkable. Median sternotomy sutures are present from prior CABG. Cardiomegaly is stable. No acute rib fracture is seen. There are degenerative changes in the mid to lower thoracic spine. IMPRESSION: 1. No evidence of rib fracture. Mild degenerative change in the mid to lower thoracic spine. 2. Stable cardiomegaly with changes of CABG. Electronically Signed   By: Ivar Drape M.D.   On: 08/09/2018 16:32   Mr Thoracic Spine W Wo Contrast  Addendum Date: 08/19/2018   ADDENDUM REPORT: 08/19/2018 15:27 ADDENDUM: While the lesion at T12 was present previously, the other areas of enhancement raise suspicion for osseous metastases in this patient with a history of lung cancer. Differential diagnosis includes treated lesions as well as atypical  hemangiomas. Other imaging is unlikely to differentiate this. Recommend short-term follow-up MRI of the thoracic spine without and with contrast at 3 months. Electronically Signed   By: San Morelle M.D.   On: 08/19/2018 15:27   Result Date: 08/19/2018 CLINICAL DATA:  Mid back pain radiating into the ribs. Acute thoracic back pain. History of lung cancer. EXAM: MRI THORACIC WITHOUT AND WITH CONTRAST TECHNIQUE: Multiplanar and multiecho pulse sequences of the thoracic spine were obtained without and with intravenous contrast. CONTRAST:  14mL MULTIHANCE GADOBENATE DIMEGLUMINE 529 MG/ML IV SOLN COMPARISON:  CT chest 08/04/2018 FINDINGS: MRI THORACIC SPINE FINDINGS Alignment:  AP alignment is anatomic. Vertebrae: Vertebral body heights are maintained. Reactive endplate changes are present anteriorly at T6-7. Multiple enhancing  lesions are present in the marrow. An 8 mm lesion present on the left at T2. A T2 and T1 hyperintense lesion is present posteriorly on the right at T12. There is central enhancement associated. Enhancing lesion near the superior endplate on the left at T10 measures 12 x 11 mm. There is focal enhancement extending into the left pedicle of T9. Cord: Normal signal is present in the thoracic spinal cord to the conus medullaris which terminates at L1. Paraspinal and other soft tissues: The paraspinous soft tissues are unremarkable. Visualized lung fields are clear. Disc levels: T1-2: Negative. T2-3: Asymmetric right-sided facet hypertrophy contributes to mild right foraminal narrowing. T3-4: Asymmetric right-sided facet hypertrophy and ligamentum flavum calcification results in moderate right foraminal stenosis. T4-5: Mild right foraminal narrowing is due to facet spurring. T5-6: Calcification of ligamentum flavum results in some encroachment of the posterior canal. The foramina are patent. A right paramedian disc protrusion effaces the ventral CSF. T6-7: Extensive calcification of the ligamentum flavum effaces the posterior CSF. The central disc protrusion is present as well. This results in mild central canal narrowing. The foramina are patent bilaterally. T7-8: Negative. T8-9: Asymmetric left-sided facet spurring contributes to mild left foraminal narrowing. T9-10: Facet spurring contributes to mild foraminal narrowing bilaterally. The central canal is patent. T10-11: Asymmetric left-sided facet hypertrophy contributes to mild left foraminal narrowing. T11-12: Asymmetric left-sided facet hypertrophy is present. There is mild left foraminal narrowing. IMPRESSION: 1. Multilevel facet disease and calcification of the ligamentum flavum is noted. 2. Central canal narrowing is most evident at T5-6 and T6-7. No abnormal cord signal is associated. 3. Asymmetric right-sided facet disease in the upper thoracic spine. T3-4 is  the most severe level. 4. Asymmetric left-sided foraminal disease in the lower thoracic spine as described. Electronically Signed: By: San Morelle M.D. On: 08/16/2018 14:52   ASSESSMENT AND PLAN : This is a very pleasant 62 years old white male with history of stage IIIa non-small cell lung cancer diagnosed in March of 2010 status post left lower lobectomy followed by adjuvant chemotherapy and the patient has been observation since August of 2010. Recent CT scan of the chest, abdomen and pelvis showed no concerning findings for disease recurrence but MRI of the thoracic spine showed a T12 lesion with other areas of enhancement raising suspicious for osseous metastasis in a patient with lung cancer. I personally and independently reviewed the imaging studies and discussed the results with the patient and his wife. I recommended for the patient to have repeat PET scan for further evaluation of his disease and to rule out any disease recurrence.  If the PET scan is suspicious for metastatic disease, I will arrange for the patient to have a biopsy of the most accessible metastatic lesion. I will  see the patient back for follow-up visit in 2-3 weeks for reevaluation and discussion of his treatment options based on the results of the PET scan. For pain management, he will continue his current medication with Percocet and muscle relaxant as prescribed by his primary care physician. The patient was advised to call immediately if he has any concerning symptoms in the interval.   All questions were answered. The patient knows to call the clinic with any problems, questions or concerns. We can certainly see the patient much sooner if necessary.

## 2018-09-05 NOTE — Telephone Encounter (Signed)
Printed calendar and avs. °

## 2018-09-22 ENCOUNTER — Telehealth: Payer: Self-pay

## 2018-09-22 ENCOUNTER — Encounter: Payer: Self-pay | Admitting: Oncology

## 2018-09-22 ENCOUNTER — Inpatient Hospital Stay: Payer: Medicare Other | Attending: Oncology | Admitting: Oncology

## 2018-09-22 ENCOUNTER — Encounter (HOSPITAL_COMMUNITY)
Admission: RE | Admit: 2018-09-22 | Discharge: 2018-09-22 | Disposition: A | Discharge status: Home / Self Care | Payer: Medicare Other | Source: Ambulatory Visit | Attending: Internal Medicine | Admitting: Internal Medicine

## 2018-09-22 VITALS — BP 157/69 | HR 68 | Temp 97.6°F | Resp 18 | Ht 73.0 in | Wt 293.8 lb

## 2018-09-22 DIAGNOSIS — Z9989 Dependence on other enabling machines and devices: Secondary | ICD-10-CM | POA: Insufficient documentation

## 2018-09-22 DIAGNOSIS — Z7902 Long term (current) use of antithrombotics/antiplatelets: Secondary | ICD-10-CM | POA: Diagnosis not present

## 2018-09-22 DIAGNOSIS — I252 Old myocardial infarction: Secondary | ICD-10-CM

## 2018-09-22 DIAGNOSIS — I739 Peripheral vascular disease, unspecified: Secondary | ICD-10-CM | POA: Insufficient documentation

## 2018-09-22 DIAGNOSIS — C3490 Malignant neoplasm of unspecified part of unspecified bronchus or lung: Secondary | ICD-10-CM | POA: Insufficient documentation

## 2018-09-22 DIAGNOSIS — Z951 Presence of aortocoronary bypass graft: Secondary | ICD-10-CM | POA: Insufficient documentation

## 2018-09-22 DIAGNOSIS — Z7982 Long term (current) use of aspirin: Secondary | ICD-10-CM | POA: Insufficient documentation

## 2018-09-22 DIAGNOSIS — Z79899 Other long term (current) drug therapy: Secondary | ICD-10-CM | POA: Diagnosis not present

## 2018-09-22 DIAGNOSIS — G4730 Sleep apnea, unspecified: Secondary | ICD-10-CM | POA: Insufficient documentation

## 2018-09-22 DIAGNOSIS — Z9221 Personal history of antineoplastic chemotherapy: Secondary | ICD-10-CM | POA: Insufficient documentation

## 2018-09-22 DIAGNOSIS — Z902 Acquired absence of lung [part of]: Secondary | ICD-10-CM | POA: Insufficient documentation

## 2018-09-22 DIAGNOSIS — C3432 Malignant neoplasm of lower lobe, left bronchus or lung: Secondary | ICD-10-CM | POA: Diagnosis not present

## 2018-09-22 DIAGNOSIS — I10 Essential (primary) hypertension: Secondary | ICD-10-CM | POA: Insufficient documentation

## 2018-09-22 DIAGNOSIS — C3412 Malignant neoplasm of upper lobe, left bronchus or lung: Secondary | ICD-10-CM | POA: Diagnosis not present

## 2018-09-22 LAB — GLUCOSE, CAPILLARY: GLUCOSE-CAPILLARY: 98 mg/dL (ref 70–99)

## 2018-09-22 MED ORDER — FLUDEOXYGLUCOSE F - 18 (FDG) INJECTION
14.1000 | Freq: Once | INTRAVENOUS | Status: AC | PRN
  Administered 2018-09-22: 14.1 via INTRAVENOUS

## 2018-09-22 NOTE — Telephone Encounter (Signed)
Printed avs and calender of upcoming appointment. Per 12/5 los

## 2018-09-22 NOTE — Assessment & Plan Note (Addendum)
This is a very pleasant 62 year old white male with history of stage IIIa non-small cell lung cancer diagnosed in March of 2010 status post left lower lobectomy followed by adjuvant chemotherapy and the patient has been observation since August of 2010. Recent CT scan of the chest, abdomen and pelvis showed no concerning findings for disease recurrence but MRI of the thoracic spine showed a T12 lesion with other areas of enhancement raising suspicious for osseous metastasis in a patient with lung cancer.  He had a PET scan performed earlier today and is here to discuss the results.  The patient was seen with Dr. Julien Nordmann.  PET scan results were discussed with the patient and his wife which did not show evidence of disease recurrence.  His back pain has now resolved.  Recommend for him to continue observation.  He will have a restaging CT scan of the chest in 1 year and follow-up 1 to 2 days after the CT scan to discuss the results.  The patient was advised to call immediately if he has any concerning symptoms in the interval.

## 2018-09-22 NOTE — Progress Notes (Signed)
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   PRIOR THERAPY:  1. Status post left lower lobectomy under the care of Dr. Roxy Manns on 01/29/2009. 2. Status post 4 cycles of adjuvant chemotherapy. First cycle was given with cisplatin and docetaxel, discontinued secondary to intolerance. The patient completed 3 more cycles with carboplatin and docetaxel. Last dose was given 05/27/2009.  CURRENT THERAPY: Observation.  INTERVAL HISTORY: Sean Gardner 62 y.o. male returns for a routine follow-up visit companied by his wife.  The patient is feeling fine today and reports improvement in his back, chest, and abdominal pain.  He is no longer taking pain medication or relaxers on a regular basis.  He denies fevers and chills.  Denies chest pain, shortness of breath, cough, hemoptysis.  Denies nausea, vomiting, constipation, diarrhea.  Denies recent weight loss or night sweats.  He had a recent PET scan and is here to discuss the results.  MEDICAL HISTORY: Past Medical History:  Diagnosis Date  . Hypertension   . Lung cancer ([place])    lung ca dx 2010  . Myocardial infarction ([place])   . Peripheral vascular disease ([place])   . Shortness of breath   . Sleep apnea    cpap    ALLERGIES:  is allergic to zolpidem tartrate.  MEDICATIONS:  Current Outpatient Medications  Medication Sig Dispense Refill  . albuterol (PROAIR HFA) 108 (90 Base) MCG/ACT inhaler ProAir HFA 90 mcg/actuation aerosol inhaler    . amLODipine (NORVASC) 5 MG tablet Take 5 mg by mouth daily.  2  . aspirin 81 MG tablet Take 81 mg by mouth daily.    . clopidogrel (PLAVIX) 75 MG tablet Take 1 tablet (75 mg total) by mouth daily before breakfast. 1 tablet 0  . cyclobenzaprine (FLEXERIL) 10 MG tablet cyclobenzaprine 10 mg  tablet  TAKE 1 TABLET BY MOUTH 3 TIMES A DAY AS NEEDED FOR MUSCLE SPASMS    . diphenhydramine-acetaminophen (TYLENOL PM) 25-500 MG TABS tablet Take 1 tablet by mouth at bedtime as needed (pain).    Marland Kitchen HYDROcodone-Acetaminophen 5-300 MG TABS hydrocodone 5 mg-acetaminophen 300 mg tablet    . HYDROcodone-homatropine (HYCODAN) 5-1.5 MG/5ML syrup Take 5 mLs by mouth every 6 (six) hours as needed for cough. FOR DISRUPTIVE NIGHT TIME COUGH AND BODY ACHES (Patient not taking: Reported on 08/09/2018) 120 mL 0  . irbesartan (AVAPRO) 300 MG tablet Take 1 tablet (300 mg total) by mouth daily. 30 tablet 0  . lactobacillus (FLORANEX/LACTINEX) PACK Take 1 packet (1 g total) by mouth 3 (three) times daily with meals. 30 packet 0  . lisinopril (PRINIVIL,ZESTRIL) 40 MG tablet Take 40 mg by mouth daily.     . methocarbamol (ROBAXIN) 500 MG tablet methocarbamol 500 mg tablet    . metoprolol (LOPRESSOR) 50 MG tablet Take 50 mg by mouth 2 (two) times daily.    . nitroGLYCERIN (NITROSTAT) 0.4 MG SL tablet nitroglycerin 0.4 mg sublingual tablet    . ranitidine (ZANTAC) 300 MG tablet ranitidine 300 mg tablet    . rosuvastatin (CRESTOR) 40 MG tablet Take 40 mg by mouth daily.    . SYMBICORT 160-4.5 MCG/ACT inhaler Inhale 2 puffs into the lungs 2 (two) times daily.    . tamsulosin (FLOMAX) 0.4 MG CAPS capsule Take 1 capsule (0.4 mg total) by mouth  daily after supper. (Patient not taking: Reported on 11/26/2017) 30 capsule 0  . Vitamin D, Ergocalciferol, (DRISDOL) 50000 UNITS CAPS Take 50,000 Units by mouth every Saturday.      No current facility-administered medications for this visit.     SURGICAL HISTORY:  Past Surgical History:  Procedure Laterality Date  . ACHILLES TENDON SURGERY    . CORONARY ARTERY BYPASS GRAFT    . FLEXIBLE SIGMOIDOSCOPY N/A 01/18/2013   Procedure: FLEXIBLE SIGMOIDOSCOPY;  Surgeon: Arta Silence, MD;  Location: [facility];  Service: Endoscopy;  Laterality: N/A;  . HERNIA REPAIR     inguinal   . HOT HEMOSTASIS N/A 01/18/2013   Procedure: HOT HEMOSTASIS (ARGON PLASMA COAGULATION/BICAP);  Surgeon: Arta Silence, MD;  Location: [facility];  Service: Endoscopy;  Laterality: N/A;  . LUNG SURGERY      REVIEW OF SYSTEMS:   Review of Systems  Constitutional: Negative for appetite change, chills, fatigue, fever and unexpected weight change.  HENT:   Negative for mouth sores, nosebleeds, sore throat and trouble swallowing.   Eyes: Negative for eye problems and icterus.  Respiratory: Negative for cough, hemoptysis, shortness of breath and wheezing.   Cardiovascular: Negative for chest pain and leg swelling.  Gastrointestinal: Negative for abdominal pain, constipation, diarrhea, nausea and vomiting.  Genitourinary: Negative for bladder incontinence, difficulty urinating, dysuria, frequency and hematuria.   Musculoskeletal: Negative for back pain, gait problem, neck pain and neck stiffness.  Skin: Negative for itching and rash.  Neurological: Negative for dizziness, extremity weakness, gait problem, headaches, light-headedness and seizures.  Hematological: Negative for adenopathy. Does not bruise/bleed easily.  Psychiatric/Behavioral: Negative for confusion, depression and sleep disturbance. The patient is not nervous/anxious.     PHYSICAL EXAMINATION:  Blood pressure (!) 157/69, pulse 68, temperature 97.6 F (36.4 C), temperature source Oral, resp. rate 18, height 6\' 1"  (1.854 m), weight 293 lb 12.8 oz (133.3 kg), SpO2 96 %.  ECOG PERFORMANCE STATUS: 1 - Symptomatic but completely ambulatory  Physical Exam  Constitutional: Oriented to person, place, and time and well-developed, well-nourished, and in no distress. No distress.  HENT:  Head: Normocephalic and atraumatic.  Mouth/Throat: Oropharynx is clear and moist. No oropharyngeal exudate.  Eyes: Conjunctivae are normal. Right eye exhibits no discharge. Left eye exhibits no discharge. No scleral icterus.  Neck: Normal range of  motion. Neck supple.  Cardiovascular: Normal rate, regular rhythm, normal heart sounds and intact distal pulses.   Pulmonary/Chest: Effort normal and breath sounds normal. No respiratory distress. No wheezes. No rales.  Abdominal: Soft. Bowel sounds are normal. Exhibits no distension and no mass. There is no tenderness.  Musculoskeletal: Normal range of motion. Exhibits no edema.  Lymphadenopathy:    No cervical adenopathy.  Neurological: Alert and oriented to person, place, and time. Exhibits normal muscle tone. Gait normal. Coordination normal.  Skin: Skin is warm and dry. No rash noted. Not diaphoretic. No erythema. No pallor.  Psychiatric: Mood, memory and judgment normal.  Vitals reviewed.  LABORATORY DATA: Lab Results  Component Value Date   WBC 6.8 09/05/2018   HGB 16.1 09/05/2018   HCT 48.3 09/05/2018   MCV 97.2 09/05/2018   PLT 236 09/05/2018      Chemistry      Component Value Date/Time   NA 142 09/05/2018 1148   NA 140 10/27/2013 0947   K 4.5 09/05/2018 1148   K 4.4 10/27/2013 0947   CL 110 09/05/2018 1148   CL 110 (H) 09/27/2012 1157   CO2 25 09/05/2018 1148  CO2 23 10/27/2013 0947   BUN 16 09/05/2018 1148   BUN 13.4 10/27/2013 0947   CREATININE 1.01 09/05/2018 1148   CREATININE 0.8 10/27/2013 0947      Component Value Date/Time   CALCIUM 10.8 (H) 09/05/2018 1148   CALCIUM 9.6 10/27/2013 0947   ALKPHOS 60 09/05/2018 1148   ALKPHOS 57 10/27/2013 0947   AST 48 (H) 09/05/2018 1148   AST 51 (H) 10/27/2013 0947   ALT 60 (H) 09/05/2018 1148   ALT 94 (H) 10/27/2013 0947   BILITOT 0.7 09/05/2018 1148   BILITOT 0.69 10/27/2013 0947       RADIOGRAPHIC STUDIES:  Nm Pet Image Restag (ps) Skull Base To Thigh  Result Date: 09/22/2018 CLINICAL DATA:  Initial treatment strategy for left lower lobe resection for lung cancer in 2010. Chemotherapy. Prior MRI of the thoracic spine demonstrating multiple foci of abnormal enhancement. EXAM: NUCLEAR MEDICINE PET SKULL  BASE TO THIGH TECHNIQUE: 14.1 mCi F-18 FDG was injected intravenously. Full-ring PET imaging was performed from the skull base to thigh after the radiotracer. CT data was obtained and used for attenuation correction and anatomic localization. Fasting blood glucose: 98 mg/dl COMPARISON:  MRI of the thoracic spine of 08/16/2018. Chest abdomen and pelvic CTs of 08/04/2018. 01/02/2009 PET. FINDINGS: Mild degradation throughout secondary to patient body habitus. Mediastinal blood pool activity: SUV max 3.2 NECK: No areas of abnormal hypermetabolism. Incidental CT findings: No cervical adenopathy. CHEST: No pulmonary parenchymal or thoracic nodal hypermetabolism. Incidental CT findings: Mild cardiomegaly. Median sternotomy for CABG. Left lower lobectomy. ABDOMEN/PELVIS: No abdominopelvic parenchymal or nodal hypermetabolism. Incidental CT findings: Moderate caudate lobe enlargement which is nonspecific. Normal adrenal glands. Abdominal aortic atherosclerosis. Mild prostatomegaly. SKELETON: No abnormal marrow activity. Incidental CT findings: Right greater than left hip osteoarthritis. Degenerate changes of the right sacroiliac joint. IMPRESSION: 1. Status post left lower lobectomy. No evidence of hypermetabolic recurrent or metastatic disease. 2. Mild degradation secondary to patient body habitus. Electronically Signed   By: Abigail Miyamoto M.D.   On: 09/22/2018 11:27     ASSESSMENT/PLAN:  Lung cancer This is a very pleasant 62 year old white male with history of stage IIIa non-small cell lung cancer diagnosed in March of 2010 status post left lower lobectomy followed by adjuvant chemotherapy and the patient has been observation since August of 2010. Recent CT scan of the chest, abdomen and pelvis showed no concerning findings for disease recurrence but MRI of the thoracic spine showed a T12 lesion with other areas of enhancement raising suspicious for osseous metastasis in a patient with lung cancer.  He had a PET  scan performed earlier today and is here to discuss the results.  The patient was seen with Dr. Julien Nordmann.  PET scan results were discussed with the patient and his wife which did not show evidence of disease recurrence.  His back pain has now resolved.  Recommend for him to continue observation.  He will have a restaging CT scan of the chest in 1 year and follow-up 1 to 2 days after the CT scan to discuss the results.  The patient was advised to call immediately if he has any concerning symptoms in the interval.   Orders Placed This Encounter  Procedures  . CT CHEST W CONTRAST    Standing Status:   Future    Standing Expiration Date:   03/23/2020    Order Specific Question:   If indicated for the ordered procedure, I authorize the administration of contrast media per Radiology  protocol    Answer:   Yes    Order Specific Question:   Preferred imaging location?    Answer:   [facility]    Order Specific Question:   Radiology Contrast Protocol - do NOT remove file path    Answer:   [email or web address]    Order Specific Question:   ** REASON FOR EXAM (FREE TEXT)    Answer:   Lung cancer. Restaging.  Marland Kitchen CBC with Differential (Cancer Center Only)    Standing Status:   Future    Standing Expiration Date:   03/23/2020  . CMP ([place] only)    Standing Status:   Future    Standing Expiration Date:   03/23/2020     Mikey Bussing, DNP, AGPCNP-BC, AOCNP 09/22/18   ADDENDUM: Hematology/Oncology Attending: I had a face-to-face encounter with the patient.  I recommended his care plan.  This is a very pleasant 62 years old white male with history of stage IIa non-small cell lung cancer diagnosed in March 2010 status post left lower lobectomy followed by adjuvant systemic chemotherapy and the patient has been on observation since that time.  He had recent imaging studies that showed suspicious bone lesions concerning for osseous metastasis. I recommended for the  patient to have a PET scan which was performed recently.  His PET scan showed no concerning findings for disease recurrence or metastasis. I discussed the PET scan results with the patient and his wife.  I recommended for him to continue on observation with repeat CT scan of the chest in 1 year.  The patient was also advised to call immediately if he has any other concerning symptoms in the interval.  Disclaimer: This note was dictated with voice recognition software. Similar sounding words can inadvertently be transcribed and may be missed upon review. Eilleen Kempf, MD 09/23/18

## 2018-11-25 ENCOUNTER — Other Ambulatory Visit: Payer: Self-pay | Admitting: Cardiology

## 2018-12-02 DIAGNOSIS — Z85118 Personal history of other malignant neoplasm of bronchus and lung: Secondary | ICD-10-CM | POA: Diagnosis not present

## 2018-12-02 DIAGNOSIS — I2510 Atherosclerotic heart disease of native coronary artery without angina pectoris: Secondary | ICD-10-CM | POA: Diagnosis not present

## 2018-12-02 DIAGNOSIS — R001 Bradycardia, unspecified: Secondary | ICD-10-CM | POA: Diagnosis not present

## 2018-12-02 DIAGNOSIS — M5416 Radiculopathy, lumbar region: Secondary | ICD-10-CM | POA: Diagnosis not present

## 2018-12-12 DIAGNOSIS — M545 Low back pain: Secondary | ICD-10-CM | POA: Diagnosis not present

## 2018-12-12 DIAGNOSIS — M25551 Pain in right hip: Secondary | ICD-10-CM | POA: Diagnosis not present

## 2018-12-12 DIAGNOSIS — M25552 Pain in left hip: Secondary | ICD-10-CM | POA: Diagnosis not present

## 2018-12-21 DIAGNOSIS — M25552 Pain in left hip: Secondary | ICD-10-CM | POA: Diagnosis not present

## 2018-12-27 DIAGNOSIS — M25552 Pain in left hip: Secondary | ICD-10-CM | POA: Diagnosis not present

## 2019-01-03 ENCOUNTER — Encounter (INDEPENDENT_AMBULATORY_CARE_PROVIDER_SITE_OTHER): Payer: Self-pay | Admitting: Orthopaedic Surgery

## 2019-01-03 ENCOUNTER — Ambulatory Visit (INDEPENDENT_AMBULATORY_CARE_PROVIDER_SITE_OTHER): Payer: Medicare Other | Admitting: Orthopaedic Surgery

## 2019-01-03 ENCOUNTER — Ambulatory Visit (INDEPENDENT_AMBULATORY_CARE_PROVIDER_SITE_OTHER): Payer: Self-pay

## 2019-01-03 ENCOUNTER — Other Ambulatory Visit: Payer: Self-pay

## 2019-01-03 ENCOUNTER — Ambulatory Visit (INDEPENDENT_AMBULATORY_CARE_PROVIDER_SITE_OTHER): Payer: Medicare Other

## 2019-01-03 DIAGNOSIS — M160 Bilateral primary osteoarthritis of hip: Secondary | ICD-10-CM

## 2019-01-03 DIAGNOSIS — M25552 Pain in left hip: Secondary | ICD-10-CM

## 2019-01-03 DIAGNOSIS — M1612 Unilateral primary osteoarthritis, left hip: Secondary | ICD-10-CM

## 2019-01-03 DIAGNOSIS — M25551 Pain in right hip: Secondary | ICD-10-CM

## 2019-01-03 DIAGNOSIS — R634 Abnormal weight loss: Secondary | ICD-10-CM

## 2019-01-03 DIAGNOSIS — M1611 Unilateral primary osteoarthritis, right hip: Secondary | ICD-10-CM

## 2019-01-03 MED ORDER — METHYLPREDNISOLONE ACETATE 40 MG/ML IJ SUSP: 40.0000 mg | Freq: Once | INTRAMUSCULAR | Status: DC

## 2019-01-03 NOTE — Progress Notes (Signed)
Subjective: He is here for ultrasound-guided bilateral hip injections.  He has osteoarthritis in both hips.  Objective: He has restricted range of motion and groin pain with passive internal rotation of both hips.  Procedure: Ultrasound-guided bilateral hip injections: After sterile prep with Betadine, injected 8 cc 1% lidocaine without epinephrine and 40 mg methylprednisolone using a 22-gauge spinal needle, passing the needle through the iliofemoral ligament into the femoral head/neck junction.  Injectate was seen filling the joint capsules of both hips.  Patient had very good pain relief during the immediate anesthetic phase.  He will follow-up as directed.

## 2019-01-03 NOTE — Progress Notes (Addendum)
Office Visit Note   Patient: Sean Gardner           Date of Birth: 12-13-1955           MRN: [id number] Visit Date: 01/03/2019              Requested by: [name], MD [place], [id number] [address] PCP: [name], MD   Assessment & Plan: Visit Diagnoses:  1. Primary osteoarthritis of right hip   2. Primary osteoarthritis of left hip   3. Pain of both hip joints   4. Weight loss     Plan: Impression is bilateral hip degenerative joint disease worse on the right and bilateral hip abductor tendinosis.  Overall I think his pain is coming more from his hip joints then from his tendinosis therefore we will try bilateral hip injections with Dr. Ernestina Patches today to see if this will give him some relief.  We will also refer him to Dr. Leafy Ro for weight loss management.  He will continue home exercises for his abductor tendinosis.  We discussed the importance of weight loss.  Questions encouraged and answered.  Follow-up as needed.  Follow-Up Instructions: Return if symptoms worsen or fail to improve.   Orders:  Orders Placed This Encounter  Procedures  . XR HIPS BILAT W OR W/O PELVIS 3-4 VIEWS  . Amb Ref to Medical Weight Management   Meds ordered this encounter  Medications  . methylPREDNISolone acetate (DEPO-MEDROL) injection 40 mg  . methylPREDNISolone acetate (DEPO-MEDROL) injection 40 mg      Procedures: No procedures performed   Clinical Data: No additional findings.   Subjective: Chief Complaint  Patient presents with  . Right Hip - Pain  . Left Hip - Pain    HPI  Review of Systems  Constitutional: Negative.   All other systems reviewed and are negative.    Objective: Vital Signs: There were no vitals taken for this visit.  Physical Exam Vitals signs and nursing note reviewed.  Constitutional:      Appearance: He is well-developed.  HENT:     Head: Normocephalic and atraumatic.  Eyes:     Pupils: Pupils are equal,  round, and reactive to light.  Neck:     Musculoskeletal: Neck supple.  Pulmonary:     Effort: Pulmonary effort is normal.  Abdominal:     Palpations: Abdomen is soft.  Musculoskeletal: Normal range of motion.  Skin:    General: Skin is warm.  Neurological:     Mental Status: He is alert and oriented to person, place, and time.  Psychiatric:        Behavior: Behavior normal.        Thought Content: Thought content normal.        Judgment: Judgment normal.     Ortho Exam Bilateral hip exam shows positive logroll with positive FADIR.  His greater trochanters are mildly tender to palpation.  Negative sciatic tension signs.  Negative Faber. Specialty Comments:  No specialty comments available.  Imaging: No results found.   PMFS History: Patient Active Problem List   Diagnosis Date Noted  . Bone metastasis ([place]) 09/05/2018  . Sepsis secondary to UTI ([place]) 05/11/2016  . CAD (coronary artery disease) 05/11/2016  . Hyperlipidemia 05/11/2016  . Morbid (severe) obesity due to excess calories ([place]) 03/21/2016  . Sleep apnea 09/18/2015  . Dyspnea and respiratory abnormality 09/18/2015  . Dyspnea 10/11/2012  . Essential hypertension 10/11/2012  . Lung cancer ([place])  03/23/2012   Past Medical History:  Diagnosis Date  . Hypertension   . Lung cancer ([place])    lung ca dx 2010  . Myocardial infarction ([place])   . Peripheral vascular disease ([place])   . Shortness of breath   . Sleep apnea    cpap    Family History  Problem Relation Age of Onset  . Lung cancer Father     Past Surgical History:  Procedure Laterality Date  . ACHILLES TENDON SURGERY    . CORONARY ARTERY BYPASS GRAFT    . FLEXIBLE SIGMOIDOSCOPY N/A 01/18/2013   Procedure: FLEXIBLE SIGMOIDOSCOPY;  Surgeon: Arta Silence, MD;  Location: [facility];  Service: Endoscopy;  Laterality: N/A;  . HERNIA REPAIR     inguinal  . HOT HEMOSTASIS N/A 01/18/2013   Procedure: HOT HEMOSTASIS (ARGON PLASMA COAGULATION/BICAP);   Surgeon: Arta Silence, MD;  Location: [facility];  Service: Endoscopy;  Laterality: N/A;  . LUNG SURGERY     Social History   Occupational History  . Not on file  Tobacco Use  . Smoking status: Former Smoker    Packs/day: 2.00    Years: 20.00    Pack years: 40.00    Types: Cigarettes    Last attempt to quit: 10/20/2007    Years since quitting: 11.2  . Smokeless tobacco: Never Used  Substance and Sexual Activity  . Alcohol use: Yes    Comment: Occassionaly  . Drug use: No  . Sexual activity: Not on file

## 2019-02-22 ENCOUNTER — Encounter: Payer: Self-pay | Admitting: Cardiology

## 2019-02-23 ENCOUNTER — Ambulatory Visit (INDEPENDENT_AMBULATORY_CARE_PROVIDER_SITE_OTHER): Payer: Medicare Other | Admitting: Cardiology

## 2019-02-23 ENCOUNTER — Encounter: Payer: Self-pay | Admitting: Cardiology

## 2019-02-23 ENCOUNTER — Other Ambulatory Visit: Payer: Self-pay

## 2019-02-23 VITALS — Ht 73.0 in | Wt 290.0 lb

## 2019-02-23 DIAGNOSIS — R0609 Other forms of dyspnea: Secondary | ICD-10-CM | POA: Diagnosis not present

## 2019-02-23 DIAGNOSIS — E662 Morbid (severe) obesity with alveolar hypoventilation: Secondary | ICD-10-CM

## 2019-02-23 DIAGNOSIS — I10 Essential (primary) hypertension: Secondary | ICD-10-CM

## 2019-02-23 DIAGNOSIS — I2510 Atherosclerotic heart disease of native coronary artery without angina pectoris: Secondary | ICD-10-CM | POA: Diagnosis not present

## 2019-02-23 DIAGNOSIS — Z951 Presence of aortocoronary bypass graft: Secondary | ICD-10-CM | POA: Diagnosis not present

## 2019-02-23 NOTE — Progress Notes (Signed)
Primary Physician/Referring:  [name], MD  Patient ID: Sean Gardner, male    DOB: 04-02-1956, 63 y.o.   MRN: [id number]  Chief Complaint  Patient presents with   Coronary Artery Disease   Hypertension   Follow-up    77mo   This visit type was conducted due to national recommendations for restrictions regarding the COVID-19 Pandemic (e.g. social distancing).  This format is felt to be most appropriate for this patient at this time.  All issues noted in this document were discussed and addressed.  No physical exam was performed (except for noted visual exam findings with Telehealth visits).  The patient has consented to conduct a Telehealth visit and understands insurance will be billed.   I discussed the limitations of evaluation and management by telemedicine and the availability of in person appointments. The patient expressed understanding and agreed to proceed.  Virtual Visit via Video Note is as below  I connected with Mr. Milstein, on 02/23/19 at [facility] by telephone and verified that I am speaking with the correct person using two identifiers. Unable to perform video visit as patient had difficulty despite multiple attempts establishing connection.     I have discussed with her regarding the safety during COVID Pandemic and steps and precautions including social distancing with the patient.    HPI: Sean Gardner  is a 63 y.o. male  with history of CABG in 2001 with LIMA to LAD, SVG to diagonal to which is occluded by angiography in September 2011 and also has free radial artery graft OM branch of the circumflex coronary artery. He has history of morbid obesity and obstructive sleep apnea and has been compliant with CPAP. Other history includes mixed hyperlipidemia and hypertension, degenerative joint disease and spinal disease and history of tobacco use disorder in the past which he has quit and has had lung cancer status post chemotherapy and left lower lobe lobectomy in April  2010.  He underwent Lexiscan nuclear stress test in March 2019 revealing mild inferior ischemia considered low risk study. I have recommended medical management.  At his last office visit, he complained of back pain that mostly radiates around the right side of his chest and occasionally around the left side felt to be musculoskeletal etiology.He was seen by Dr. Cay Schillings and underwent MRI of the thoracic spine showed a T12 lesion with other areas of enhancement raising suspicious for osseous metastasis in a patient with lung cancer. He was reevaluated by Dr. Inda Merlin in Nov 2019 and underwent PET scan that was without evidence of disease. He is to return in 1 year for follow up. Was evaluated by Dr. Tomie China and had bilateral hip injections that has resolved his back pain.  He has chronic dyspnea on exertion that has remained stable. He has been staying at home due to COVID-19, but still is active with maintaining [place]. No decreased exercise tolerance. Tolerating medications well. Does not have a way to check BP at home.  Past Medical History:  Diagnosis Date   Hypertension    Lung cancer ([place])    lung ca dx 2010   Myocardial infarction [facility])    Peripheral vascular disease ([place])    Shortness of breath    Sleep apnea    cpap    Past Surgical History:  Procedure Laterality Date   ACHILLES TENDON SURGERY     CORONARY ARTERY BYPASS GRAFT     FLEXIBLE SIGMOIDOSCOPY N/A 01/18/2013   Procedure: FLEXIBLE SIGMOIDOSCOPY;  Surgeon: Arta Silence,  MD;  Location: [facility] ENDOSCOPY;  Service: Endoscopy;  Laterality: N/A;   HERNIA REPAIR     inguinal   HOT HEMOSTASIS N/A 01/18/2013   Procedure: HOT HEMOSTASIS (ARGON PLASMA COAGULATION/BICAP);  Surgeon: Arta Silence, MD;  Location: [facility];  Service: Endoscopy;  Laterality: N/A;   LUNG SURGERY      Social History   Socioeconomic History   Marital status: Married    Spouse name: Not on file   Number of children: 2   Years of  education: Not on file   Highest education level: Not on file  Occupational History   Not on file  Social Needs   Financial resource strain: Not on file   Food insecurity:    Worry: Not on file    Inability: Not on file   Transportation needs:    Medical: Not on file    Non-medical: Not on file  Tobacco Use   Smoking status: Former Smoker    Packs/day: 2.00    Years: 20.00    Pack years: 40.00    Types: Cigarettes    Last attempt to quit: 10/20/2007    Years since quitting: 11.3   Smokeless tobacco: Never Used  Substance and Sexual Activity   Alcohol use: Not Currently   Drug use: No   Sexual activity: Not on file  Lifestyle   Physical activity:    Days per week: Not on file    Minutes per session: Not on file   Stress: Not on file  Relationships   Social connections:    Talks on phone: Not on file    Gets together: Not on file    Attends religious service: Not on file    Active member of club or organization: Not on file    Attends meetings of clubs or organizations: Not on file    Relationship status: Not on file   Intimate partner violence:    Fear of current or ex partner: Not on file    Emotionally abused: Not on file    Physically abused: Not on file    Forced sexual activity: Not on file  Other Topics Concern   Not on file  Social History Narrative   Not on file    Current Outpatient Medications on File Prior to Visit  Medication Sig Dispense Refill   amLODipine (NORVASC) 5 MG tablet TAKE 1 TABLET BY MOUTH EVERY DAY 30 tablet 2   aspirin 81 MG tablet Take 81 mg by mouth daily.     clopidogrel (PLAVIX) 75 MG tablet Take 1 tablet (75 mg total) by mouth daily before breakfast. 1 tablet 0   diphenhydramine-acetaminophen (TYLENOL PM) 25-500 MG TABS tablet Take 1 tablet by mouth at bedtime as needed (pain).     lisinopril (PRINIVIL,ZESTRIL) 40 MG tablet Take 40 mg by mouth daily.      metoprolol (LOPRESSOR) 50 MG tablet Take 50 mg by mouth  2 (two) times daily.     nitroGLYCERIN (NITROSTAT) 0.4 MG SL tablet nitroglycerin 0.4 mg sublingual tablet     omeprazole (PRILOSEC) 20 MG capsule Take 20 mg by mouth daily.     rosuvastatin (CRESTOR) 40 MG tablet Take 40 mg by mouth daily.     SYMBICORT 160-4.5 MCG/ACT inhaler Inhale 2 puffs into the lungs 2 (two) times daily.     Vitamin D, Ergocalciferol, (DRISDOL) 50000 UNITS CAPS Take 50,000 Units by mouth every Saturday.      Current Facility-Administered Medications on File Prior to Visit  Medication Dose Route Frequency Provider Last Rate Last Dose   methylPREDNISolone acetate (DEPO-MEDROL) injection 40 mg  40 mg Intra-articular Once Hilts, Michael, MD       methylPREDNISolone acetate (DEPO-MEDROL) injection 40 mg  40 mg Intra-articular Once Hilts, Michael, MD        Review of Systems  Constitution: Negative for decreased appetite, malaise/fatigue, weight gain and weight loss.  Eyes: Negative for visual disturbance.  Cardiovascular: Positive for claudication (mild bilateral calf) and dyspnea on exertion. Negative for chest pain, leg swelling, orthopnea, palpitations and syncope.  Respiratory: Negative for hemoptysis and wheezing.   Endocrine: Negative for cold intolerance and heat intolerance.  Hematologic/Lymphatic: Does not bruise/bleed easily.  Skin: Negative for nail changes.  Musculoskeletal: Positive for back pain and joint pain (has chronic ankle sprain. DJD. and bony spurs and tendon calcification. back pain). Negative for muscle weakness and myalgias.  Gastrointestinal: Negative for abdominal pain, change in bowel habit, nausea and vomiting.  Neurological: Negative for difficulty with concentration, dizziness, focal weakness and headaches.  Psychiatric/Behavioral: Negative for altered mental status and suicidal ideas.  All other systems reviewed and are negative.     Objective  Height 6\' 1"  (1.854 m), weight 290 lb (131.5 kg). Body mass index is 38.26 kg/m.     Physical exam not performed or limited due to virtual visit. Please see exam details from prior visit is as below.   Physical Exam  Constitutional: He is oriented to person, place, and time. Vital signs are normal. He appears well-developed and well-nourished.  Morbidly obese  HENT:  Head: Normocephalic and atraumatic.  Neck: Normal range of motion.  Cardiovascular: Normal rate, regular rhythm, normal heart sounds and intact distal pulses.  Pulses:      Femoral pulses are 1+ on the right side and 1+ on the left side.      Popliteal pulses are 0 on the right side and 0 on the left side.       Dorsalis pedis pulses are 0 on the right side and 0 on the left side.       Posterior tibial pulses are 0 on the right side and 2+ on the left side.  Pulmonary/Chest: Effort normal and breath sounds normal. No accessory muscle usage. No respiratory distress.  Abdominal: Soft. Bowel sounds are normal. A hernia is present. Hernia confirmed positive in the ventral area (reducible).  Musculoskeletal: Normal range of motion.  Neurological: He is alert and oriented to person, place, and time.  Skin: Skin is warm and dry.  Vitals reviewed.  Radiology:  MRI spine 08/16/2018: 1. Multilevel facet disease and calcification of the ligamentum flavum is noted. 2. Central canal narrowing is most evident at T5-6 and T6-7. No abnormal cord signal is associated. 3. Asymmetric right-sided facet disease in the upper thoracic spine. T3-4 is the most severe level. 4. Asymmetric left-sided foraminal disease in the lower thoracic spine as described.  Laboratory examination:    CMP Latest Ref Rng & Units 09/05/2018 08/09/2018 11/26/2017  Glucose 70 - 99 mg/dL 96 91 108(H)  BUN 8 - 23 mg/dL 16 12 13   Creatinine 0.61 - 1.24 mg/dL 1.01 0.94 0.84  Sodium 135 - 145 mmol/L 142 139 138  Potassium 3.5 - 5.1 mmol/L 4.5 4.7 4.1  Chloride 98 - 111 mmol/L 110 110 107  CO2 22 - 32 mmol/L 25 23 19(L)  Calcium 8.9 - 10.3  mg/dL 10.8(H) 10.0 9.9  Total Protein 6.5 - 8.1 g/dL 7.2 - -  Total  Bilirubin 0.3 - 1.2 mg/dL 0.7 - -  Alkaline Phos 38 - 126 U/L 60 - -  AST 15 - 41 U/L 48(H) - -  ALT 0 - 44 U/L 60(H) - -   CBC Latest Ref Rng & Units 09/05/2018 08/09/2018 11/26/2017  WBC 4.0 - 10.5 K/uL 6.8 8.8 7.5  Hemoglobin 13.0 - 17.0 g/dL 16.1 15.4 14.6  Hematocrit 39.0 - 52.0 % 48.3 46.1 43.8  Platelets 150 - 400 K/uL 236 262 267   Lipid Panel     Component Value Date/Time   CHOL (H) 08/13/2010 1200    214        ATP III CLASSIFICATION:  <200     mg/dL   Desirable  200-239  mg/dL   Borderline High  >=240    mg/dL   High          TRIG 163 (H) 08/13/2010 1200   HDL 27 (L) 08/13/2010 1200   CHOLHDL 7.9 08/13/2010 1200   VLDL 33 08/13/2010 1200   LDLCALC (H) 08/13/2010 1200    154        Total Cholesterol/HDL:CHD Risk Coronary Heart Disease Risk Table                     Men   Women  1/2 Average Risk   3.4   3.3  Average Risk       5.0   4.4  2 X Average Risk   9.6   7.1  3 X Average Risk  23.4   11.0        Use the calculated Patient Ratio above and the CHD Risk Table to determine the patient's CHD Risk.        ATP III CLASSIFICATION (LDL):  <100     mg/dL   Optimal  100-129  mg/dL   Near or Above                    Optimal  130-159  mg/dL   Borderline  160-189  mg/dL   High  >190     mg/dL   Very High   HEMOGLOBIN A1C Lab Results  Component Value Date   HGBA1C  08/13/2010    5.4 (NOTE)                                                                       According to the ADA Clinical Practice Recommendations for 2011, when HbA1c is used as a screening test:   >=6.5%   Diagnostic of Diabetes Mellitus           (if abnormal result  is confirmed)  5.7-6.4%   Increased risk of developing Diabetes Mellitus  References:Diagnosis and Classification of Diabetes Mellitus,Diabetes JIRC,7893,81(OFBPZ 1):S62-S69 and Standards of Medical Care in         Diabetes - 2011,Diabetes Care,2011,34  (Suppl  1):S11-S61.   MPG 108 08/13/2010   TSH No results for input(s): TSH in the last 8760 hours.  Cardiac Studies:   Echocardiogram 11/04/2017: Left ventricle cavity is normal in size. Moderate concentric hypertrophy of the left ventricle. Normal global wall motion. Doppler evidence of grade I (impaired) diastolic dysfunction, normal LAP. Calculated EF 57%. Left atrial cavity is moderately  dilated. Right ventricle cavity is mildly dilated. Normal right ventricular function. Mild to moderate mitral regurgitation. Inadequate tricuspid regurgitation jet to estimate pulmonary artery pressure IVC is dilated with a respiratory response of <50%. This may suggest elevated right atrial pressure. No significant change compared to prior study in 2013.  Lexiscan myoview stress test 12/20/2017: 1. Pharmacologic stress testing was performed with intravenous administration of .4 mg of Lexiscan over a 10-15 seconds infusion. Resting blood pressure was 146/68 mmHg and peak effect blood pressure was 164/74 mmHg. Stress symptoms included dyspnea, dizziness. 2. Exercise capacity not assessed. The resting electrocardiogram demonstrated normal sinus rhythm, normal resting conduction and no resting arrhythmias. T wave inversions inferior and anterolateral leads. Cannot exclude ischemia. Stress EKG is non diagnostic for ischemia as it is a pharmacologic stress. 3. The overall quality of the study is fair. Left ventricular cavity is noted to be normal on the rest and stress studies. Gated SPECT imaging demonstrates hypokinesis of the apical anterior myocardial wall(s). The left ventricular ejection fraction was calculated or visually estimated to be 59%. SPECT images demonstrate small, fixed perfusion abnormality of moderate intensity in the apical anterior myocardial wall(s). SPECT images also demonstrate small, reversible perfusion abnormality of mild intensity in mid inferior, basal inferior myocardium. This suggests mild  inferior ischemia, and possible anteroapical infarct. 4. Low to intermediate risk study.  Assessment   Atherosclerosis of native coronary artery of native heart without angina pectoris  S/P CABG x 3  Dyspnea on exertion  Essential hypertension  Morbid obesity with alveolar hypoventilation (HCC)  EKG 08/25/2018: Normal sinus rhythm at rate of 60 bpm, normal axis, deep T wave inversion in the inferior and anterolateral leads, cannot exclude some myocardial ischemia versus infarct. No change from EKG 10/14/2017  Recommendations:   Patient is presently doing well without any significant complaints today.  Denies any symptoms of angina.  Continues to have dyspnea on exertion that has remained stable. He has not been monitoring his blood pressure, but states has been normal on previous checks with other physicians, will continue with current medical therapy.  Previously noted MRI of thoracic spine abnormalities were evaluated by Dr. Earlie Server and has had negative PET scan. He will follow-up with him in 1 year for reevaluation given his history of lung cancer. Found to have bilateral hip arthritis and back pain has resolved with bilateral injections.    Does not appear to have had recent lipids, will order this to be performed in a few weeks once patient feels more comfortable going to the lab.  No changes were made medications today.  Encouraged him to ensure that he is getting some type of exercise daily. I will see him back in 6 months or sooner if problems.  Miquel Dunn, MSN, APRN, FNP-C [facility]. [place] Office: [phone] Fax: [phone]

## 2019-02-28 ENCOUNTER — Other Ambulatory Visit: Payer: Self-pay | Admitting: Cardiology

## 2019-02-28 NOTE — Telephone Encounter (Signed)
Please fill

## 2019-07-25 DIAGNOSIS — M5416 Radiculopathy, lumbar region: Secondary | ICD-10-CM | POA: Diagnosis not present

## 2019-07-25 DIAGNOSIS — E782 Mixed hyperlipidemia: Secondary | ICD-10-CM | POA: Diagnosis not present

## 2019-07-25 DIAGNOSIS — J449 Chronic obstructive pulmonary disease, unspecified: Secondary | ICD-10-CM | POA: Diagnosis not present

## 2019-07-25 DIAGNOSIS — G629 Polyneuropathy, unspecified: Secondary | ICD-10-CM | POA: Diagnosis not present

## 2019-07-25 DIAGNOSIS — M5136 Other intervertebral disc degeneration, lumbar region: Secondary | ICD-10-CM | POA: Diagnosis not present

## 2019-07-25 DIAGNOSIS — C3490 Malignant neoplasm of unspecified part of unspecified bronchus or lung: Secondary | ICD-10-CM | POA: Diagnosis not present

## 2019-07-25 DIAGNOSIS — G4733 Obstructive sleep apnea (adult) (pediatric): Secondary | ICD-10-CM | POA: Diagnosis not present

## 2019-07-25 DIAGNOSIS — E559 Vitamin D deficiency, unspecified: Secondary | ICD-10-CM | POA: Diagnosis not present

## 2019-07-25 DIAGNOSIS — I10 Essential (primary) hypertension: Secondary | ICD-10-CM | POA: Diagnosis not present

## 2019-07-25 DIAGNOSIS — I2510 Atherosclerotic heart disease of native coronary artery without angina pectoris: Secondary | ICD-10-CM | POA: Diagnosis not present

## 2019-07-25 DIAGNOSIS — K219 Gastro-esophageal reflux disease without esophagitis: Secondary | ICD-10-CM | POA: Diagnosis not present

## 2019-08-08 ENCOUNTER — Other Ambulatory Visit: Payer: Self-pay

## 2019-08-08 DIAGNOSIS — Z20822 Contact with and (suspected) exposure to covid-19: Secondary | ICD-10-CM

## 2019-08-08 DIAGNOSIS — J40 Bronchitis, not specified as acute or chronic: Secondary | ICD-10-CM | POA: Diagnosis not present

## 2019-08-09 LAB — NOVEL CORONAVIRUS, NAA: SARS-CoV-2, NAA: NOT DETECTED

## 2019-08-31 ENCOUNTER — Encounter: Payer: Self-pay | Admitting: Cardiology

## 2019-08-31 ENCOUNTER — Other Ambulatory Visit: Payer: Self-pay

## 2019-08-31 ENCOUNTER — Telehealth (INDEPENDENT_AMBULATORY_CARE_PROVIDER_SITE_OTHER): Payer: Medicare Other | Admitting: Cardiology

## 2019-08-31 VITALS — Ht 72.0 in | Wt 284.0 lb

## 2019-08-31 DIAGNOSIS — Z951 Presence of aortocoronary bypass graft: Secondary | ICD-10-CM | POA: Diagnosis not present

## 2019-08-31 DIAGNOSIS — E662 Morbid (severe) obesity with alveolar hypoventilation: Secondary | ICD-10-CM | POA: Diagnosis not present

## 2019-08-31 DIAGNOSIS — I2510 Atherosclerotic heart disease of native coronary artery without angina pectoris: Secondary | ICD-10-CM | POA: Diagnosis not present

## 2019-08-31 DIAGNOSIS — I10 Essential (primary) hypertension: Secondary | ICD-10-CM

## 2019-08-31 MED ORDER — AMLODIPINE BESYLATE 5 MG PO TABS: 5.0000 mg | ORAL_TABLET | Freq: Every day | ORAL | 3 refills | Status: DC

## 2019-08-31 NOTE — Progress Notes (Signed)
Primary Physician/Referring:  [name], MD  Patient ID: Sean Gardner, male    DOB: May 11, 1956, 63 y.o.   MRN: [id number]  Chief Complaint  Patient presents with  . Coronary Artery Disease   This visit type was conducted due to national recommendations for restrictions regarding the COVID-19 Pandemic (e.g. social distancing).  This format is felt to be most appropriate for this patient at this time.  All issues noted in this document were discussed and addressed.  No physical exam was performed (except for noted visual exam findings with Telehealth visits).  The patient has consented to conduct a Telehealth visit and understands insurance will be billed.   I discussed the limitations of evaluation and management by telemedicine and the availability of in person appointments. The patient expressed understanding and agreed to proceed.  Virtual Visit via Video Note is as below  I connected with@, on 08/31/19 at 0915 by a video enabled telemedicine application and verified that I am speaking with the correct person using two identifiers.     I have discussed with her regarding the safety during COVID Pandemic and steps and precautions including social distancing with the patient.     HPI: Sean Gardner  is a 63 y.o. male  with history of CABG in 2001 with LIMA to LAD, SVG to diagonal to which is occluded by angiography in September 2011 and also has free radial artery graft OM branch of the circumflex coronary artery. He has history of morbid obesity and obstructive sleep apnea and has been compliant with CPAP. Other history includes mixed hyperlipidemia and hypertension, degenerative joint disease and spinal disease and history of tobacco use disorder in the past which he has quit and has had lung cancer status post chemotherapy and left lower lobe lobectomy in April 2010.  He underwent Lexiscan nuclear stress test in March 2019 revealing mild inferior ischemia considered low risk study.  I have recommended medical management.  This is a 6 month virtual visit. He has recently recovered from a URI. Negative COVID test. He has chronic dyspnea on exertion that is unchanged. He has been staying at home due to COVID-19, but still is active with maintaining 4 acres. No decreased exercise tolerance. Tolerating medications well. He has been out of his amlodipine.  He reports that he has had labs and CT scan at the [place].   Past Medical History:  Diagnosis Date  . Hypertension   . Lung cancer ([place])    lung ca dx 2010  . Myocardial infarction ([place])   . Peripheral vascular disease ([place])   . Shortness of breath   . Sleep apnea    cpap    Past Surgical History:  Procedure Laterality Date  . ACHILLES TENDON SURGERY    . CORONARY ARTERY BYPASS GRAFT    . FLEXIBLE SIGMOIDOSCOPY N/A 01/18/2013   Procedure: FLEXIBLE SIGMOIDOSCOPY;  Surgeon: Arta Silence, MD;  Location: [facility];  Service: Endoscopy;  Laterality: N/A;  . HERNIA REPAIR     inguinal  . HOT HEMOSTASIS N/A 01/18/2013   Procedure: HOT HEMOSTASIS (ARGON PLASMA COAGULATION/BICAP);  Surgeon: Arta Silence, MD;  Location: [facility];  Service: Endoscopy;  Laterality: N/A;  . LUNG SURGERY      Social History   Socioeconomic History  . Marital status: Married    Spouse name: Not on file  . Number of children: 2  . Years of education: Not on file  . Highest education level: Not on file  Occupational History  .  Not on file  Social Needs  . Financial resource strain: Not on file  . Food insecurity    Worry: Not on file    Inability: Not on file  . Transportation needs    Medical: Not on file    Non-medical: Not on file  Tobacco Use  . Smoking status: Former Smoker    Packs/day: 2.00    Years: 20.00    Pack years: 40.00    Types: Cigarettes    Quit date: 10/20/2007    Years since quitting: 11.8  . Smokeless tobacco: Never Used  Substance and Sexual Activity  . Alcohol use: Not Currently  . Drug use: No  .  Sexual activity: Not on file  Lifestyle  . Physical activity    Days per week: Not on file    Minutes per session: Not on file  . Stress: Not on file  Relationships  . Social Herbalist on phone: Not on file    Gets together: Not on file    Attends religious service: Not on file    Active member of club or organization: Not on file    Attends meetings of clubs or organizations: Not on file    Relationship status: Not on file  . Intimate partner violence    Fear of current or ex partner: Not on file    Emotionally abused: Not on file    Physically abused: Not on file    Forced sexual activity: Not on file  Other Topics Concern  . Not on file  Social History Narrative  . Not on file    Current Outpatient Medications on File Prior to Visit  Medication Sig Dispense Refill  . aspirin 81 MG tablet Take 81 mg by mouth daily.    . clopidogrel (PLAVIX) 75 MG tablet Take 1 tablet (75 mg total) by mouth daily before breakfast. 1 tablet 0  . diphenhydramine-acetaminophen (TYLENOL PM) 25-500 MG TABS tablet Take 1 tablet by mouth at bedtime as needed (pain).    Marland Kitchen lisinopril (PRINIVIL,ZESTRIL) 40 MG tablet Take 40 mg by mouth daily.     . metoprolol (LOPRESSOR) 50 MG tablet Take 50 mg by mouth 2 (two) times daily.    . nitroGLYCERIN (NITROSTAT) 0.4 MG SL tablet nitroglycerin 0.4 mg sublingual tablet    . omeprazole (PRILOSEC) 20 MG capsule Take 20 mg by mouth daily.    . rosuvastatin (CRESTOR) 40 MG tablet Take 40 mg by mouth daily.    . SYMBICORT 160-4.5 MCG/ACT inhaler Inhale 2 puffs into the lungs 2 (two) times daily.    . Vitamin D, Ergocalciferol, (DRISDOL) 50000 UNITS CAPS Take 50,000 Units by mouth every Saturday.      Current Facility-Administered Medications on File Prior to Visit  Medication Dose Route Frequency Provider Last Rate Last Dose  . methylPREDNISolone acetate (DEPO-MEDROL) injection 40 mg  40 mg Intra-articular Once Hilts, Michael, MD      . methylPREDNISolone  acetate (DEPO-MEDROL) injection 40 mg  40 mg Intra-articular Once Hilts, Michael, MD        Review of Systems  Constitution: Negative for decreased appetite, malaise/fatigue, weight gain and weight loss.  Eyes: Negative for visual disturbance.  Cardiovascular: Positive for claudication (mild bilateral calf) and dyspnea on exertion. Negative for chest pain, leg swelling, orthopnea, palpitations and syncope.  Respiratory: Negative for hemoptysis and wheezing.   Endocrine: Negative for cold intolerance and heat intolerance.  Hematologic/Lymphatic: Does not bruise/bleed easily.  Skin: Negative for nail  changes.  Musculoskeletal: Positive for back pain and joint pain (has chronic ankle sprain. DJD. and bony spurs and tendon calcification. back pain). Negative for muscle weakness and myalgias.  Gastrointestinal: Negative for abdominal pain, change in bowel habit, nausea and vomiting.  Neurological: Negative for difficulty with concentration, dizziness, focal weakness and headaches.  Psychiatric/Behavioral: Negative for altered mental status and suicidal ideas.  All other systems reviewed and are negative.     Objective  Height 6' (1.829 m), weight 284 lb (128.8 kg). Body mass index is 38.52 kg/m.    Physical exam not performed or limited due to virtual visit.  Patient appeared to be in no distress, Neck was supple, respiration was not labored.  Please see exam details from prior visit is as below.   Physical Exam  Constitutional: He is oriented to person, place, and time. Vital signs are normal. He appears well-developed and well-nourished.  Morbidly obese  HENT:  Head: Normocephalic and atraumatic.  Neck: Normal range of motion.  Cardiovascular: Normal rate, regular rhythm, normal heart sounds and intact distal pulses.  Pulses:      Femoral pulses are 1+ on the right side and 1+ on the left side.      Popliteal pulses are 0 on the right side and 0 on the left side.       Dorsalis  pedis pulses are 0 on the right side and 0 on the left side.       Posterior tibial pulses are 0 on the right side and 2+ on the left side.  Pulmonary/Chest: Effort normal and breath sounds normal. No accessory muscle usage. No respiratory distress.  Abdominal: Soft. Bowel sounds are normal. A hernia is present. Hernia confirmed positive in the ventral area (reducible).  Musculoskeletal: Normal range of motion.  Neurological: He is alert and oriented to person, place, and time.  Skin: Skin is warm and dry.  Vitals reviewed.  Radiology:  MRI spine 08/16/2018: 1. Multilevel facet disease and calcification of the ligamentum flavum is noted. 2. Central canal narrowing is most evident at T5-6 and T6-7. No abnormal cord signal is associated. 3. Asymmetric right-sided facet disease in the upper thoracic spine. T3-4 is the most severe level. 4. Asymmetric left-sided foraminal disease in the lower thoracic spine as described.  Laboratory examination:    CMP Latest Ref Rng & Units 09/05/2018 08/09/2018 11/26/2017  Glucose 70 - 99 mg/dL 96 91 108(H)  BUN 8 - 23 mg/dL 16 12 13   Creatinine 0.61 - 1.24 mg/dL 1.01 0.94 0.84  Sodium 135 - 145 mmol/L 142 139 138  Potassium 3.5 - 5.1 mmol/L 4.5 4.7 4.1  Chloride 98 - 111 mmol/L 110 110 107  CO2 22 - 32 mmol/L 25 23 19(L)  Calcium 8.9 - 10.3 mg/dL 10.8(H) 10.0 9.9  Total Protein 6.5 - 8.1 g/dL 7.2 - -  Total Bilirubin 0.3 - 1.2 mg/dL 0.7 - -  Alkaline Phos 38 - 126 U/L 60 - -  AST 15 - 41 U/L 48(H) - -  ALT 0 - 44 U/L 60(H) - -   CBC Latest Ref Rng & Units 09/05/2018 08/09/2018 11/26/2017  WBC 4.0 - 10.5 K/uL 6.8 8.8 7.5  Hemoglobin 13.0 - 17.0 g/dL 16.1 15.4 14.6  Hematocrit 39.0 - 52.0 % 48.3 46.1 43.8  Platelets 150 - 400 K/uL 236 262 267   Lipid Panel     Component Value Date/Time   CHOL (H) 08/13/2010 1200    214  ATP III CLASSIFICATION:  <200     mg/dL   Desirable  200-239  mg/dL   Borderline High  >=240    mg/dL   High           TRIG 163 (H) 08/13/2010 1200   HDL 27 (L) 08/13/2010 1200   CHOLHDL 7.9 08/13/2010 1200   VLDL 33 08/13/2010 1200   LDLCALC (H) 08/13/2010 1200    154        Total Cholesterol/HDL:CHD Risk Coronary Heart Disease Risk Table                     Men   Women  1/2 Average Risk   3.4   3.3  Average Risk       5.0   4.4  2 X Average Risk   9.6   7.1  3 X Average Risk  23.4   11.0        Use the calculated Patient Ratio above and the CHD Risk Table to determine the patient's CHD Risk.        ATP III CLASSIFICATION (LDL):  <100     mg/dL   Optimal  100-129  mg/dL   Near or Above                    Optimal  130-159  mg/dL   Borderline  160-189  mg/dL   High  >190     mg/dL   Very High   HEMOGLOBIN A1C Lab Results  Component Value Date   HGBA1C  08/13/2010    5.4 (NOTE)                                                                       According to the ADA Clinical Practice Recommendations for 2011, when HbA1c is used as a screening test:   >=6.5%   Diagnostic of Diabetes Mellitus           (if abnormal result  is confirmed)  5.7-6.4%   Increased risk of developing Diabetes Mellitus  References:Diagnosis and Classification of Diabetes Mellitus,Diabetes RUEA,5409,81(XBJYN 1):S62-S69 and Standards of Medical Care in         Diabetes - 2011,Diabetes Care,2011,34  (Suppl 1):S11-S61.   MPG 108 08/13/2010   TSH No results for input(s): TSH in the last 8760 hours.  Cardiac Studies:   Echocardiogram 11/04/2017: Left ventricle cavity is normal in size. Moderate concentric hypertrophy of the left ventricle. Normal global wall motion. Doppler evidence of grade I (impaired) diastolic dysfunction, normal LAP. Calculated EF 57%. Left atrial cavity is moderately dilated. Right ventricle cavity is mildly dilated. Normal right ventricular function. Mild to moderate mitral regurgitation. Inadequate tricuspid regurgitation jet to estimate pulmonary artery pressure IVC is dilated with a  respiratory response of <50%. This may suggest elevated right atrial pressure. No significant change compared to prior study in 2013.  Lexiscan myoview stress test 12/20/2017: 1. Pharmacologic stress testing was performed with intravenous administration of .4 mg of Lexiscan over a 10-15 seconds infusion. Resting blood pressure was 146/68 mmHg and peak effect blood pressure was 164/74 mmHg. Stress symptoms included dyspnea, dizziness. 2. Exercise capacity not assessed. The resting electrocardiogram demonstrated normal sinus rhythm, normal resting conduction and  no resting arrhythmias. T wave inversions inferior and anterolateral leads. Cannot exclude ischemia. Stress EKG is non diagnostic for ischemia as it is a pharmacologic stress. 3. The overall quality of the study is fair. Left ventricular cavity is noted to be normal on the rest and stress studies. Gated SPECT imaging demonstrates hypokinesis of the apical anterior myocardial wall(s). The left ventricular ejection fraction was calculated or visually estimated to be 59%. SPECT images demonstrate small, fixed perfusion abnormality of moderate intensity in the apical anterior myocardial wall(s). SPECT images also demonstrate small, reversible perfusion abnormality of mild intensity in mid inferior, basal inferior myocardium. This suggests mild inferior ischemia, and possible anteroapical infarct. 4. Low to intermediate risk study.  Assessment   Atherosclerosis of native coronary artery of native heart without angina pectoris  Essential hypertension  S/P CABG x 3  Morbid obesity with alveolar hypoventilation (HCC)  EKG 08/25/2018: Normal sinus rhythm at rate of 60 bpm, normal axis, deep T wave inversion in the inferior and anterolateral leads, cannot exclude some myocardial ischemia versus infarct. No change from EKG 10/14/2017  Recommendations:   This is a 70-month virtual visit, he is doing well without any significant complaints.  He has  not had any symptoms of angina.  He has chronic dyspnea on exertion that is unchanged.  He did not have a way to check his blood pressure today.  He will obtain home blood pressure cuff to start home monitoring.  I refilled his amlodipine that he has been out of.  He reports he has had labs with the [place], we will try to obtain records of this for further evaluation.  If he has not had lipid panel, CMP, and CBC, will obtain this.  I have encouraged him to continue to work on weight loss.  I will see him back in 2 months for an office visit.  Miquel Dunn, MSN, APRN, FNP-C [facility]. [place] Office: [phone] Fax: [phone]

## 2019-09-25 ENCOUNTER — Inpatient Hospital Stay: Payer: 59 | Attending: Internal Medicine

## 2019-09-27 ENCOUNTER — Inpatient Hospital Stay: Payer: 59 | Admitting: Internal Medicine

## 2019-09-27 ENCOUNTER — Telehealth: Payer: Self-pay

## 2019-09-27 NOTE — Telephone Encounter (Signed)
I left a vm for Sean Gardner to call the office to schedule his CT scan and reschedule with Dr. Julien Nordmann.

## 2019-10-31 ENCOUNTER — Encounter: Payer: Self-pay | Admitting: Cardiology

## 2019-10-31 ENCOUNTER — Other Ambulatory Visit: Payer: Self-pay

## 2019-10-31 ENCOUNTER — Ambulatory Visit (INDEPENDENT_AMBULATORY_CARE_PROVIDER_SITE_OTHER): Payer: Medicare Other | Admitting: Cardiology

## 2019-10-31 VITALS — BP 150/88 | HR 76 | Temp 98.2°F | Resp 16 | Ht 72.0 in | Wt 289.4 lb

## 2019-10-31 DIAGNOSIS — E662 Morbid (severe) obesity with alveolar hypoventilation: Secondary | ICD-10-CM

## 2019-10-31 DIAGNOSIS — I739 Peripheral vascular disease, unspecified: Secondary | ICD-10-CM | POA: Diagnosis not present

## 2019-10-31 DIAGNOSIS — I2510 Atherosclerotic heart disease of native coronary artery without angina pectoris: Secondary | ICD-10-CM

## 2019-10-31 DIAGNOSIS — Z951 Presence of aortocoronary bypass graft: Secondary | ICD-10-CM | POA: Diagnosis not present

## 2019-10-31 DIAGNOSIS — I10 Essential (primary) hypertension: Secondary | ICD-10-CM | POA: Diagnosis not present

## 2019-10-31 NOTE — Progress Notes (Signed)
Primary Physician/Referring:  [name], MD  Patient ID: Sean Gardner, male    DOB: 1956/06/19, 64 y.o.   MRN: [id number]  Chief Complaint  Patient presents with  . Coronary Artery Disease  . Hypertension    HPI: Sean Gardner  is a 64 y.o. male  with history of CABG in 2001 with LIMA to LAD, SVG to diagonal to which is occluded by angiography in September 2011 and also has free radial artery graft OM branch of the circumflex coronary artery. He has history of morbid obesity and obstructive sleep apnea and has been compliant with CPAP. Other history includes mixed hyperlipidemia and hypertension, degenerative joint disease and spinal disease and history of tobacco use disorder in the past which he has quit and has had lung cancer status post chemotherapy and left lower lobe lobectomy in April 2010.  He underwent Lexiscan nuclear stress test in March 2019 revealing mild inferior ischemia considered low risk study. I have recommended medical management.  This is a 2 month visit. He has been staying at home due to COVID-19 and is anxious about potentially contracting the virus, but still is active with maintaining 4 acres, but not as much due to winter months. No decreased exercise tolerance. Tolerating medications well. He has occasional chest discomfort not necessarily associated with exertion, "feels like an electric shock" that last for just a few seconds. He is mostly complaining of pain in his thigh (R>L) that can occur with resting; however, does happen with activity and in fact limits his activity.  He reports that he has had labs and CT scan at the [place] just a few months ago.  Past Medical History:  Diagnosis Date  . Hypertension   . Lung cancer ([place])    lung ca dx 2010  . Myocardial infarction ([place])   . Peripheral vascular disease ([place])   . Shortness of breath   . Sleep apnea    cpap    Past Surgical History:  Procedure Laterality Date  . ACHILLES TENDON SURGERY    .  CORONARY ARTERY BYPASS GRAFT    . FLEXIBLE SIGMOIDOSCOPY N/A 01/18/2013   Procedure: FLEXIBLE SIGMOIDOSCOPY;  Surgeon: Arta Silence, MD;  Location: [facility];  Service: Endoscopy;  Laterality: N/A;  . HERNIA REPAIR     inguinal  . HOT HEMOSTASIS N/A 01/18/2013   Procedure: HOT HEMOSTASIS (ARGON PLASMA COAGULATION/BICAP);  Surgeon: Arta Silence, MD;  Location: [facility];  Service: Endoscopy;  Laterality: N/A;  . LUNG SURGERY      Social History   Socioeconomic History  . Marital status: Married    Spouse name: Not on file  . Number of children: 2  . Years of education: Not on file  . Highest education level: Not on file  Occupational History  . Not on file  Tobacco Use  . Smoking status: Former Smoker    Packs/day: 2.00    Years: 20.00    Pack years: 40.00    Types: Cigarettes    Quit date: 10/20/2007    Years since quitting: 12.0  . Smokeless tobacco: Never Used  Substance and Sexual Activity  . Alcohol use: Not Currently  . Drug use: No  . Sexual activity: Not on file  Other Topics Concern  . Not on file  Social History Narrative  . Not on file   Social Determinants of Health   Financial Resource Strain:   . Difficulty of Paying Living Expenses: Not on file  Food Insecurity:   .  Worried About Charity fundraiser in the Last Year: Not on file  . Ran Out of Food in the Last Year: Not on file  Transportation Needs:   . Lack of Transportation (Medical): Not on file  . Lack of Transportation (Non-Medical): Not on file  Physical Activity:   . Days of Exercise per Week: Not on file  . Minutes of Exercise per Session: Not on file  Stress:   . Feeling of Stress : Not on file  Social Connections:   . Frequency of Communication with Friends and Family: Not on file  . Frequency of Social Gatherings with Friends and Family: Not on file  . Attends Religious Services: Not on file  . Active Member of Clubs or Organizations: Not on file  . Attends Archivist  Meetings: Not on file  . Marital Status: Not on file  Intimate Partner Violence:   . Fear of Current or Ex-Partner: Not on file  . Emotionally Abused: Not on file  . Physically Abused: Not on file  . Sexually Abused: Not on file    Current Outpatient Medications on File Prior to Visit  Medication Sig Dispense Refill  . amLODipine (NORVASC) 5 MG tablet Take 1 tablet (5 mg total) by mouth daily. 90 tablet 3  . aspirin 81 MG tablet Take 81 mg by mouth daily.    . clopidogrel (PLAVIX) 75 MG tablet Take 1 tablet (75 mg total) by mouth daily before breakfast. 1 tablet 0  . diphenhydramine-acetaminophen (TYLENOL PM) 25-500 MG TABS tablet Take 1 tablet by mouth at bedtime as needed (pain).    Marland Kitchen lisinopril (PRINIVIL,ZESTRIL) 40 MG tablet Take 40 mg by mouth daily.     . metoprolol (LOPRESSOR) 50 MG tablet Take 50 mg by mouth 2 (two) times daily.    Marland Kitchen omeprazole (PRILOSEC) 20 MG capsule Take 20 mg by mouth daily.    . rosuvastatin (CRESTOR) 40 MG tablet Take 40 mg by mouth daily.    . SYMBICORT 160-4.5 MCG/ACT inhaler Inhale 2 puffs into the lungs 2 (two) times daily.    . Vitamin D, Ergocalciferol, (DRISDOL) 50000 UNITS CAPS Take 50,000 Units by mouth every Saturday.     . nitroGLYCERIN (NITROSTAT) 0.4 MG SL tablet nitroglycerin 0.4 mg sublingual tablet     Current Facility-Administered Medications on File Prior to Visit  Medication Dose Route Frequency Provider Last Rate Last Admin  . methylPREDNISolone acetate (DEPO-MEDROL) injection 40 mg  40 mg Intra-articular Once Hilts, Michael, MD      . methylPREDNISolone acetate (DEPO-MEDROL) injection 40 mg  40 mg Intra-articular Once Hilts, Michael, MD        Review of Systems  Constitution: Negative for decreased appetite, malaise/fatigue, weight gain and weight loss.  Eyes: Negative for visual disturbance.  Cardiovascular: Positive for claudication (worsened; bilateral thigh area) and dyspnea on exertion. Negative for chest pain, leg swelling,  orthopnea, palpitations and syncope.  Respiratory: Negative for hemoptysis and wheezing.   Endocrine: Negative for cold intolerance and heat intolerance.  Hematologic/Lymphatic: Does not bruise/bleed easily.  Skin: Negative for nail changes.  Musculoskeletal: Positive for back pain and joint pain (has chronic ankle sprain. DJD. and bony spurs and tendon calcification. back pain). Negative for muscle weakness and myalgias.  Gastrointestinal: Negative for abdominal pain, change in bowel habit, nausea and vomiting.  Neurological: Negative for difficulty with concentration, dizziness, focal weakness and headaches.  Psychiatric/Behavioral: Negative for altered mental status and suicidal ideas.  All other systems reviewed and are  negative.     Objective  Blood pressure (!) 150/88, pulse 76, temperature 98.2 F (36.8 C), temperature source Temporal, resp. rate 16, height 6' (1.829 m), weight 289 lb 6.4 oz (131.3 kg), SpO2 96 %. Body mass index is 39.25 kg/m.      Physical Exam  Constitutional: He is oriented to person, place, and time. Vital signs are normal. He appears well-developed and well-nourished.  Morbidly obese  HENT:  Head: Normocephalic and atraumatic.  Cardiovascular: Normal rate, regular rhythm, normal heart sounds and intact distal pulses.  Pulses:      Femoral pulses are 1+ on the right side and 1+ on the left side.      Popliteal pulses are 0 on the right side and 0 on the left side.       Dorsalis pedis pulses are 0 on the right side and 0 on the left side.       Posterior tibial pulses are 0 on the right side and 2+ on the left side.  Pulmonary/Chest: Effort normal and breath sounds normal. No accessory muscle usage. No respiratory distress.  Abdominal: Soft. Bowel sounds are normal. A hernia is present. Hernia confirmed positive in the ventral area (reducible).  Musculoskeletal:        General: Normal range of motion.     Cervical back: Normal range of motion.   Neurological: He is alert and oriented to person, place, and time.  Skin: Skin is warm and dry.  Vitals reviewed.  Radiology:  MRI spine 08/16/2018: 1. Multilevel facet disease and calcification of the ligamentum flavum is noted. 2. Central canal narrowing is most evident at T5-6 and T6-7. No abnormal cord signal is associated. 3. Asymmetric right-sided facet disease in the upper thoracic spine. T3-4 is the most severe level. 4. Asymmetric left-sided foraminal disease in the lower thoracic spine as described.  Laboratory examination:    CMP Latest Ref Rng & Units 09/05/2018 08/09/2018 11/26/2017  Glucose 70 - 99 mg/dL 96 91 108(H)  BUN 8 - 23 mg/dL 16 12 13   Creatinine 0.61 - 1.24 mg/dL 1.01 0.94 0.84  Sodium 135 - 145 mmol/L 142 139 138  Potassium 3.5 - 5.1 mmol/L 4.5 4.7 4.1  Chloride 98 - 111 mmol/L 110 110 107  CO2 22 - 32 mmol/L 25 23 19(L)  Calcium 8.9 - 10.3 mg/dL 10.8(H) 10.0 9.9  Total Protein 6.5 - 8.1 g/dL 7.2 - -  Total Bilirubin 0.3 - 1.2 mg/dL 0.7 - -  Alkaline Phos 38 - 126 U/L 60 - -  AST 15 - 41 U/L 48(H) - -  ALT 0 - 44 U/L 60(H) - -   CBC Latest Ref Rng & Units 09/05/2018 08/09/2018 11/26/2017  WBC 4.0 - 10.5 K/uL 6.8 8.8 7.5  Hemoglobin 13.0 - 17.0 g/dL 16.1 15.4 14.6  Hematocrit 39.0 - 52.0 % 48.3 46.1 43.8  Platelets 150 - 400 K/uL 236 262 267   Lipid Panel     Component Value Date/Time   CHOL (H) 08/13/2010 1200    214        ATP III CLASSIFICATION:  <200     mg/dL   Desirable  200-239  mg/dL   Borderline High  >=240    mg/dL   High          TRIG 163 (H) 08/13/2010 1200   HDL 27 (L) 08/13/2010 1200   CHOLHDL 7.9 08/13/2010 1200   VLDL 33 08/13/2010 1200   LDLCALC (H) 08/13/2010 1200  154        Total Cholesterol/HDL:CHD Risk Coronary Heart Disease Risk Table                     Men   Women  1/2 Average Risk   3.4   3.3  Average Risk       5.0   4.4  2 X Average Risk   9.6   7.1  3 X Average Risk  23.4   11.0        Use the  calculated Patient Ratio above and the CHD Risk Table to determine the patient's CHD Risk.        ATP III CLASSIFICATION (LDL):  <100     mg/dL   Optimal  100-129  mg/dL   Near or Above                    Optimal  130-159  mg/dL   Borderline  160-189  mg/dL   High  >190     mg/dL   Very High   HEMOGLOBIN A1C Lab Results  Component Value Date   HGBA1C  08/13/2010    5.4 (NOTE)                                                                       According to the ADA Clinical Practice Recommendations for 2011, when HbA1c is used as a screening test:   >=6.5%   Diagnostic of Diabetes Mellitus           (if abnormal result  is confirmed)  5.7-6.4%   Increased risk of developing Diabetes Mellitus  References:Diagnosis and Classification of Diabetes Mellitus,Diabetes NATF,5732,20(URKYH 1):S62-S69 and Standards of Medical Care in         Diabetes - 2011,Diabetes Care,2011,34  (Suppl 1):S11-S61.   MPG 108 08/13/2010   TSH No results for input(s): TSH in the last 8760 hours.  Cardiac Studies:   Echocardiogram 11/04/2017: Left ventricle cavity is normal in size. Moderate concentric hypertrophy of the left ventricle. Normal global wall motion. Doppler evidence of grade I (impaired) diastolic dysfunction, normal LAP. Calculated EF 57%. Left atrial cavity is moderately dilated. Right ventricle cavity is mildly dilated. Normal right ventricular function. Mild to moderate mitral regurgitation. Inadequate tricuspid regurgitation jet to estimate pulmonary artery pressure IVC is dilated with a respiratory response of <50%. This may suggest elevated right atrial pressure. No significant change compared to prior study in 2013.  Lexiscan myoview stress test 12/20/2017: 1. Pharmacologic stress testing was performed with intravenous administration of .4 mg of Lexiscan over a 10-15 seconds infusion. Resting blood pressure was 146/68 mmHg and peak effect blood pressure was 164/74 mmHg. Stress symptoms  included dyspnea, dizziness. 2. Exercise capacity not assessed. The resting electrocardiogram demonstrated normal sinus rhythm, normal resting conduction and no resting arrhythmias. T wave inversions inferior and anterolateral leads. Cannot exclude ischemia. Stress EKG is non diagnostic for ischemia as it is a pharmacologic stress. 3. The overall quality of the study is fair. Left ventricular cavity is noted to be normal on the rest and stress studies. Gated SPECT imaging demonstrates hypokinesis of the apical anterior myocardial wall(s). The left ventricular ejection fraction was calculated or visually estimated to be  59%. SPECT images demonstrate small, fixed perfusion abnormality of moderate intensity in the apical anterior myocardial wall(s). SPECT images also demonstrate small, reversible perfusion abnormality of mild intensity in mid inferior, basal inferior myocardium. This suggests mild inferior ischemia, and possible anteroapical infarct. 4. Low to intermediate risk study.  Assessment   Essential hypertension - Plan: EKG 12-Lead  Claudication in peripheral vascular disease ([place]) - Plan: PCV LOWER ARTERIAL (BILATERAL)  Atherosclerosis of native coronary artery of native heart without angina pectoris  S/P CABG x 3  Morbid obesity with alveolar hypoventilation ([place])  EKG 10/31/2019: Normal sinus rhythm at rate of 76 bpm, normal axis, RBBB. Diffuse T wave inversion  Recommendations:   Patient has symptoms of claudication that has been worsening over the last few months and are limiting his activity.  He does also have pain in his thigh at rest it is likely related to chronic back pain.  He is high risk for PAD, will obtain lower extremity arterial duplex for further evaluation.  His blood pressure is elevated today, I will further increase his amlodipine to 10 mg daily.  He is overall doing well without any symptoms of angina.  No clinical evidence of heart failure.  He reports having labs  done a few months ago at the [place], he will try to obtain these and bring them to his next appointment.  If he is unable to do so by his next appointment, will obtain CBC, CMP and lipids. Will need to continue to work on weight loss. I will plan to see him back in 4 weeks after his lower extremity duplex for follow-up on hypertension.  Miquel Dunn, MSN, APRN, FNP-C [facility]. [place] Office: [phone] Fax: [phone]

## 2019-11-02 ENCOUNTER — Other Ambulatory Visit: Payer: Medicare Other

## 2019-11-02 DIAGNOSIS — R0600 Dyspnea, unspecified: Secondary | ICD-10-CM | POA: Diagnosis not present

## 2019-11-02 DIAGNOSIS — C3490 Malignant neoplasm of unspecified part of unspecified bronchus or lung: Secondary | ICD-10-CM | POA: Diagnosis not present

## 2019-11-02 DIAGNOSIS — M169 Osteoarthritis of hip, unspecified: Secondary | ICD-10-CM | POA: Diagnosis not present

## 2019-11-02 DIAGNOSIS — N400 Enlarged prostate without lower urinary tract symptoms: Secondary | ICD-10-CM | POA: Diagnosis not present

## 2019-11-02 DIAGNOSIS — J449 Chronic obstructive pulmonary disease, unspecified: Secondary | ICD-10-CM | POA: Diagnosis not present

## 2019-11-03 ENCOUNTER — Other Ambulatory Visit: Payer: Self-pay

## 2019-11-03 ENCOUNTER — Ambulatory Visit (INDEPENDENT_AMBULATORY_CARE_PROVIDER_SITE_OTHER): Payer: Medicare Other

## 2019-11-03 DIAGNOSIS — I739 Peripheral vascular disease, unspecified: Secondary | ICD-10-CM

## 2019-11-05 ENCOUNTER — Other Ambulatory Visit: Payer: Self-pay | Admitting: Cardiology

## 2019-11-07 ENCOUNTER — Ambulatory Visit: Payer: Medicare Other | Admitting: Orthopaedic Surgery

## 2019-11-15 ENCOUNTER — Telehealth: Payer: Self-pay | Admitting: Orthopaedic Surgery

## 2019-11-15 ENCOUNTER — Ambulatory Visit: Payer: Medicare Other | Admitting: Orthopaedic Surgery

## 2019-11-15 NOTE — Telephone Encounter (Signed)
Patient called.   He has an appointment for an injection for this afternoon. He wanted to know if he will still be able to get it even though he had his first round of the vaccine 7 days ago.  Call back number: [phone]

## 2019-11-16 NOTE — Telephone Encounter (Signed)
Called patient to let him know he can come in to see Dr Erlinda Hong whenever he can

## 2019-11-27 ENCOUNTER — Other Ambulatory Visit: Payer: Self-pay

## 2019-11-27 ENCOUNTER — Ambulatory Visit (INDEPENDENT_AMBULATORY_CARE_PROVIDER_SITE_OTHER): Payer: Medicare Other | Admitting: Cardiology

## 2019-11-27 ENCOUNTER — Encounter: Payer: Self-pay | Admitting: Cardiology

## 2019-11-27 VITALS — BP 140/80 | HR 65 | Temp 98.4°F | Ht 72.0 in | Wt 292.2 lb

## 2019-11-27 DIAGNOSIS — I2510 Atherosclerotic heart disease of native coronary artery without angina pectoris: Secondary | ICD-10-CM | POA: Diagnosis not present

## 2019-11-27 DIAGNOSIS — E782 Mixed hyperlipidemia: Secondary | ICD-10-CM

## 2019-11-27 DIAGNOSIS — I10 Essential (primary) hypertension: Secondary | ICD-10-CM

## 2019-11-27 DIAGNOSIS — I739 Peripheral vascular disease, unspecified: Secondary | ICD-10-CM

## 2019-11-27 DIAGNOSIS — E662 Morbid (severe) obesity with alveolar hypoventilation: Secondary | ICD-10-CM

## 2019-11-27 NOTE — Progress Notes (Signed)
Primary Physician/Referring:  [name], MD  Patient ID: Sean Gardner, male    DOB: Apr 16, 1956, 64 y.o.   MRN: [id number]  Chief Complaint  Patient presents with  . Hypertension  . Follow-up    4 weeks  . Results    [name]    HPI: Sean Gardner  is a 64 y.o. male  with history of CABG in 2001 with LIMA to LAD, SVG to diagonal to which is occluded by angiography in September 2011 and also has free radial artery graft OM branch of the circumflex coronary artery. He has history of morbid obesity and obstructive sleep apnea and has been compliant with CPAP. Other history includes mixed hyperlipidemia and hypertension, degenerative joint disease and spinal disease and history of tobacco use disorder in the past which he has quit and has had lung cancer status post chemotherapy and left lower lobe lobectomy in April 2010.  He underwent Lexiscan nuclear stress test in March 2019 revealing mild inferior ischemia considered low risk study. I have recommended medical management.  Recently seen for follow-up, mention symptoms of claudication.  He underwent lower extremity duplex and now presents to discuss results.  Has bilateral hip tightness with walking that improves with resting.  No cramping in calves.  He was originally scheduled to see his orthopedic physician, but decided to cancel as he is in the process of getting his Covid vaccine.  He reports that he has had labs and CT scan at the [place] just a few months ago.  Past Medical History:  Diagnosis Date  . Hyperlipidemia   . Hypertension   . Lung cancer ([place])    lung ca dx 2010  . Myocardial infarction ([place])   . Peripheral vascular disease ([place])   . Shortness of breath   . Sleep apnea    cpap    Past Surgical History:  Procedure Laterality Date  . ACHILLES TENDON SURGERY    . CORONARY ARTERY BYPASS GRAFT    . FLEXIBLE SIGMOIDOSCOPY N/A 01/18/2013   Procedure: FLEXIBLE SIGMOIDOSCOPY;  Surgeon: Arta Silence, MD;  Location: [facility]  ENDOSCOPY;  Service: Endoscopy;  Laterality: N/A;  . HERNIA REPAIR     inguinal  . HOT HEMOSTASIS N/A 01/18/2013   Procedure: HOT HEMOSTASIS (ARGON PLASMA COAGULATION/BICAP);  Surgeon: Arta Silence, MD;  Location: [facility];  Service: Endoscopy;  Laterality: N/A;  . LUNG SURGERY      Social History   Socioeconomic History  . Marital status: Married    Spouse name: Not on file  . Number of children: 2  . Years of education: Not on file  . Highest education level: Not on file  Occupational History  . Not on file  Tobacco Use  . Smoking status: Former Smoker    Packs/day: 2.00    Years: 20.00    Pack years: 40.00    Types: Cigarettes    Quit date: 10/20/2007    Years since quitting: 12.1  . Smokeless tobacco: Never Used  Substance and Sexual Activity  . Alcohol use: Not Currently  . Drug use: No  . Sexual activity: Not on file  Other Topics Concern  . Not on file  Social History Narrative  . Not on file   Social Determinants of Health   Financial Resource Strain:   . Difficulty of Paying Living Expenses: Not on file  Food Insecurity:   . Worried About Charity fundraiser in the Last Year: Not on file  . Ran Out of  Food in the Last Year: Not on file  Transportation Needs:   . Lack of Transportation (Medical): Not on file  . Lack of Transportation (Non-Medical): Not on file  Physical Activity:   . Days of Exercise per Week: Not on file  . Minutes of Exercise per Session: Not on file  Stress:   . Feeling of Stress : Not on file  Social Connections:   . Frequency of Communication with Friends and Family: Not on file  . Frequency of Social Gatherings with Friends and Family: Not on file  . Attends Religious Services: Not on file  . Active Member of Clubs or Organizations: Not on file  . Attends Archivist Meetings: Not on file  . Marital Status: Not on file  Intimate Partner Violence:   . Fear of Current or Ex-Partner: Not on file  . Emotionally Abused:  Not on file  . Physically Abused: Not on file  . Sexually Abused: Not on file    Current Outpatient Medications on File Prior to Visit  Medication Sig Dispense Refill  . amLODipine (NORVASC) 5 MG tablet Take 1 tablet (5 mg total) by mouth daily. 90 tablet 3  . aspirin 81 MG tablet Take 81 mg by mouth daily.    . clopidogrel (PLAVIX) 75 MG tablet Take 1 tablet (75 mg total) by mouth daily before breakfast. 1 tablet 0  . diphenhydramine-acetaminophen (TYLENOL PM) 25-500 MG TABS tablet Take 1 tablet by mouth at bedtime as needed (pain).    Marland Kitchen lisinopril (PRINIVIL,ZESTRIL) 40 MG tablet Take 40 mg by mouth daily.     . metoprolol (LOPRESSOR) 50 MG tablet Take 50 mg by mouth 2 (two) times daily.    . nitroGLYCERIN (NITROSTAT) 0.4 MG SL tablet nitroglycerin 0.4 mg sublingual tablet    . omeprazole (PRILOSEC) 20 MG capsule Take 20 mg by mouth daily.    . rosuvastatin (CRESTOR) 40 MG tablet Take 40 mg by mouth daily.    . Vitamin D, Ergocalciferol, (DRISDOL) 50000 UNITS CAPS Take 50,000 Units by mouth every Saturday.     . SYMBICORT 160-4.5 MCG/ACT inhaler Inhale 2 puffs into the lungs 2 (two) times daily.     Current Facility-Administered Medications on File Prior to Visit  Medication Dose Route Frequency Provider Last Rate Last Admin  . methylPREDNISolone acetate (DEPO-MEDROL) injection 40 mg  40 mg Intra-articular Once Hilts, Michael, MD      . methylPREDNISolone acetate (DEPO-MEDROL) injection 40 mg  40 mg Intra-articular Once Hilts, Michael, MD        Review of Systems  Constitution: Negative for decreased appetite, malaise/fatigue, weight gain and weight loss.  Eyes: Negative for visual disturbance.  Cardiovascular: Positive for claudication (worsened; bilateral thigh area) and dyspnea on exertion. Negative for chest pain, leg swelling, orthopnea, palpitations and syncope.  Respiratory: Negative for hemoptysis and wheezing.   Endocrine: Negative for cold intolerance and heat intolerance.   Hematologic/Lymphatic: Does not bruise/bleed easily.  Skin: Negative for nail changes.  Musculoskeletal: Positive for back pain and joint pain (has chronic ankle sprain. DJD. and bony spurs and tendon calcification. back pain). Negative for muscle weakness and myalgias.  Gastrointestinal: Negative for abdominal pain, change in bowel habit, nausea and vomiting.  Neurological: Negative for difficulty with concentration, dizziness, focal weakness and headaches.  Psychiatric/Behavioral: Negative for altered mental status and suicidal ideas.  All other systems reviewed and are negative.     Objective  Blood pressure 140/80, pulse 65, temperature 98.4 F (36.9 C), height  6' (1.829 m), weight 292 lb 3.2 oz (132.5 kg), SpO2 97 %. Body mass index is 39.63 kg/m.      Physical Exam  Constitutional: He is oriented to person, place, and time. Vital signs are normal. He appears well-developed and well-nourished.  Morbidly obese  HENT:  Head: Normocephalic and atraumatic.  Cardiovascular: Normal rate, regular rhythm, normal heart sounds and intact distal pulses.  Pulses:      Femoral pulses are 2+ on the right side and 2+ on the left side.      Popliteal pulses are 0 on the right side and 0 on the left side.       Dorsalis pedis pulses are 0 on the right side and 0 on the left side.       Posterior tibial pulses are 0 on the right side and 2+ on the left side.  Pulmonary/Chest: Effort normal and breath sounds normal. No accessory muscle usage. No respiratory distress.  Abdominal: Soft. Bowel sounds are normal. A hernia is present. Hernia confirmed positive in the ventral area (reducible).  Musculoskeletal:        General: Normal range of motion.     Cervical back: Normal range of motion.  Neurological: He is alert and oriented to person, place, and time.  Skin: Skin is warm and dry.  Vitals reviewed.  Radiology:  MRI spine 08/16/2018: 1. Multilevel facet disease and calcification of the  ligamentum flavum is noted. 2. Central canal narrowing is most evident at T5-6 and T6-7. No abnormal cord signal is associated. 3. Asymmetric right-sided facet disease in the upper thoracic spine. T3-4 is the most severe level. 4. Asymmetric left-sided foraminal disease in the lower thoracic spine as described.  Laboratory examination:    CMP Latest Ref Rng & Units 09/05/2018 08/09/2018 11/26/2017  Glucose 70 - 99 mg/dL 96 91 108(H)  BUN 8 - 23 mg/dL 16 12 13   Creatinine 0.61 - 1.24 mg/dL 1.01 0.94 0.84  Sodium 135 - 145 mmol/L 142 139 138  Potassium 3.5 - 5.1 mmol/L 4.5 4.7 4.1  Chloride 98 - 111 mmol/L 110 110 107  CO2 22 - 32 mmol/L 25 23 19(L)  Calcium 8.9 - 10.3 mg/dL 10.8(H) 10.0 9.9  Total Protein 6.5 - 8.1 g/dL 7.2 - -  Total Bilirubin 0.3 - 1.2 mg/dL 0.7 - -  Alkaline Phos 38 - 126 U/L 60 - -  AST 15 - 41 U/L 48(H) - -  ALT 0 - 44 U/L 60(H) - -   CBC Latest Ref Rng & Units 09/05/2018 08/09/2018 11/26/2017  WBC 4.0 - 10.5 K/uL 6.8 8.8 7.5  Hemoglobin 13.0 - 17.0 g/dL 16.1 15.4 14.6  Hematocrit 39.0 - 52.0 % 48.3 46.1 43.8  Platelets 150 - 400 K/uL 236 262 267   Lipid Panel     Component Value Date/Time   CHOL (H) 08/13/2010 1200    214        ATP III CLASSIFICATION:  <200     mg/dL   Desirable  200-239  mg/dL   Borderline High  >=240    mg/dL   High          TRIG 163 (H) 08/13/2010 1200   HDL 27 (L) 08/13/2010 1200   CHOLHDL 7.9 08/13/2010 1200   VLDL 33 08/13/2010 1200   LDLCALC (H) 08/13/2010 1200    154        Total Cholesterol/HDL:CHD Risk Coronary Heart Disease Risk Table  Men   Women  1/2 Average Risk   3.4   3.3  Average Risk       5.0   4.4  2 X Average Risk   9.6   7.1  3 X Average Risk  23.4   11.0        Use the calculated Patient Ratio above and the CHD Risk Table to determine the patient's CHD Risk.        ATP III CLASSIFICATION (LDL):  <100     mg/dL   Optimal  100-129  mg/dL   Near or Above                     Optimal  130-159  mg/dL   Borderline  160-189  mg/dL   High  >190     mg/dL   Very High   HEMOGLOBIN A1C Lab Results  Component Value Date   HGBA1C  08/13/2010    5.4 (NOTE)                                                                       According to the ADA Clinical Practice Recommendations for 2011, when HbA1c is used as a screening test:   >=6.5%   Diagnostic of Diabetes Mellitus           (if abnormal result  is confirmed)  5.7-6.4%   Increased risk of developing Diabetes Mellitus  References:Diagnosis and Classification of Diabetes Mellitus,Diabetes QBHA,1937,90(WIOXB 1):S62-S69 and Standards of Medical Care in         Diabetes - 2011,Diabetes Care,2011,34  (Suppl 1):S11-S61.   MPG 108 08/13/2010   TSH No results for input(s): TSH in the last 8760 hours.  Cardiac Studies:   Lower Extremity Arterial Duplex 11/03/2019:  No hemodynamically significant stenosis are identified in the right lower  extremity arterial system. The right DP and PT show monophasic waveform  suggests diffuse small vessel disease.  Left mid SFA is near occlusion with diffuse plaque and monophasic  waveform.  This exam reveals moderately decreased perfusion of the right lower  extremity, noted at the post tibial artery level (ABI 0.67) and moderately  decreased perfusion of the left lower extremity, noted at the post tibial  artery level (ABI 0.73).  Compared to 2014, bilateral ABI 0.58.  Echocardiogram 11/04/2017: Left ventricle cavity is normal in size. Moderate concentric hypertrophy of the left ventricle. Normal global wall motion. Doppler evidence of grade I (impaired) diastolic dysfunction, normal LAP. Calculated EF 57%. Left atrial cavity is moderately dilated. Right ventricle cavity is mildly dilated. Normal right ventricular function. Mild to moderate mitral regurgitation. Inadequate tricuspid regurgitation jet to estimate pulmonary artery pressure IVC is dilated with a respiratory  response of <50%. This may suggest elevated right atrial pressure. No significant change compared to prior study in 2013.  Lexiscan myoview stress test 12/20/2017: 1. Pharmacologic stress testing was performed with intravenous administration of .4 mg of Lexiscan over a 10-15 seconds infusion. Resting blood pressure was 146/68 mmHg and peak effect blood pressure was 164/74 mmHg. Stress symptoms included dyspnea, dizziness. 2. Exercise capacity not assessed. The resting electrocardiogram demonstrated normal sinus rhythm, normal resting conduction and no resting arrhythmias. T wave inversions inferior and anterolateral leads.  Cannot exclude ischemia. Stress EKG is non diagnostic for ischemia as it is a pharmacologic stress. 3. The overall quality of the study is fair. Left ventricular cavity is noted to be normal on the rest and stress studies. Gated SPECT imaging demonstrates hypokinesis of the apical anterior myocardial wall(s). The left ventricular ejection fraction was calculated or visually estimated to be 59%. SPECT images demonstrate small, fixed perfusion abnormality of moderate intensity in the apical anterior myocardial wall(s). SPECT images also demonstrate small, reversible perfusion abnormality of mild intensity in mid inferior, basal inferior myocardium. This suggests mild inferior ischemia, and possible anteroapical infarct. 4. Low to intermediate risk study.  Assessment   Claudication in peripheral vascular disease ([place])  Atherosclerosis of native coronary artery of native heart without angina pectoris  Essential hypertension - Plan: Comprehensive Metabolic Panel (CMET), CBC, Lipid Profile  Mixed hyperlipidemia  Morbid obesity with alveolar hypoventilation (HCC)  EKG 10/31/2019: Normal sinus rhythm at rate of 76 bpm, normal axis, RBBB. Diffuse T wave inversion  Recommendations:   Sean Gardner  is a 64 y.o. male  with history of CABG in 2001 with LIMA to LAD, SVG to diagonal to  which is occluded by angiography in September 2011 and also has free radial artery graft OM branch of the circumflex coronary artery. He has history of morbid obesity and obstructive sleep apnea and has been compliant with CPAP. Other history includes mixed hyperlipidemia and hypertension, degenerative joint disease and spinal disease and history of tobacco use disorder in the past which he has quit and has had lung cancer status post chemotherapy and left lower lobe lobectomy in April 2010.  Recently seen for claudication symptoms, underwent lower extremity duplex and now presents to discuss results.  I have reviewed and discussed lower extremity duplex results.  He does have classic claudication symptoms, but do feel that he is likely also having some contributing symptoms from his back and hips.  I have recommended that he be evaluated by his orthopedic physician and if not found to have significant abnormalities, will further pursue PV angiogram.  Encouraged him to continue to work on weight loss and to be more active and help with his circulation.  I do not have recent labs, will obtain CBC, CMP and lipids for further evaluation.  He is on aspirin and Plavix.  Also on Crestor for hyperlipidemia.  Initially blood pressure was elevated in our office, but had improved on recheck.  He has started monitoring at home, has noted systolic readings in the 101 range on occasion.  Could potentially increase his lisinopril as he is only taking half a tablet, but would like to evaluate his kidney function prior to doing this.  I will send him a message once I have lab results.  I will see him back in 3 months for follow-up on PAD, but urged him to contact us sooner if needed.  Miquel Dunn, MSN, APRN, FNP-C [facility]. [place] Office: [phone] Fax: [phone]

## 2019-12-05 ENCOUNTER — Ambulatory Visit: Payer: Medicare Other | Admitting: Orthopaedic Surgery

## 2019-12-07 ENCOUNTER — Ambulatory Visit: Payer: Medicare Other | Admitting: Orthopaedic Surgery

## 2019-12-12 ENCOUNTER — Ambulatory Visit: Payer: Self-pay

## 2019-12-12 ENCOUNTER — Ambulatory Visit (INDEPENDENT_AMBULATORY_CARE_PROVIDER_SITE_OTHER): Payer: Medicare Other | Admitting: Orthopaedic Surgery

## 2019-12-12 ENCOUNTER — Encounter: Payer: Self-pay | Admitting: Orthopaedic Surgery

## 2019-12-12 ENCOUNTER — Other Ambulatory Visit: Payer: Self-pay

## 2019-12-12 DIAGNOSIS — M1612 Unilateral primary osteoarthritis, left hip: Secondary | ICD-10-CM

## 2019-12-12 DIAGNOSIS — M1611 Unilateral primary osteoarthritis, right hip: Secondary | ICD-10-CM | POA: Diagnosis not present

## 2019-12-12 DIAGNOSIS — I2510 Atherosclerotic heart disease of native coronary artery without angina pectoris: Secondary | ICD-10-CM

## 2019-12-12 NOTE — Progress Notes (Signed)
Subjective: Patient is here for ultrasound-guided intra-articular bilateral hip injection.   Last injections helped for many months.  Objective: Pain with internal hip rotation.  Procedure: Ultrasound-guided bilateral hip injection: After sterile prep with Betadine, injected 8 cc 1% lidocaine without epinephrine and 40 mg methylprednisolone using a 22-gauge spinal needle, passing the needle through the iliofemoral ligament into the femoral head/neck junction.  Injectate was seen filling both joint capsules.  Good immediate relief.

## 2019-12-12 NOTE — Progress Notes (Signed)
Office Visit Note   Patient: Sean Gardner           Date of Birth: 31-Aug-1956           MRN: [id number] Visit Date: 12/12/2019              Requested by: [name], MD [place],  [place] [id number] PCP: [name], MD   Assessment & Plan: Visit Diagnoses:  1. Primary osteoarthritis of right hip   2. Primary osteoarthritis of left hip     Plan: Impression is bilateral hip osteoarthritis.  We will refer the patient back to Dr. [name] for repeat cortisone injections to both hips.  We did discuss definitive treatment of bilateral sequential total hip replacements.  We have also discussed the need for him to remain at a BMI of under 40.  He will follow-up with [name] as needed.  Follow-Up Instructions: Return if symptoms worsen or fail to improve.   Orders:  Orders Placed This Encounter  Procedures  . XR Pelvis 1-2 Views  . US Guided Needle Placement   No orders of the defined types were placed in this encounter.     Procedures: No procedures performed   Clinical Data: No additional findings.   Subjective: Chief Complaint  Patient presents with  . Left Hip - Pain  . Right Hip - Pain    HPI patient is a pleasant 64 year old gentleman who comes in today with recurrent bilateral hip pain right greater than left.  He was seen in our office in March 2020 for the same issue.  He was referred to Dr. [name] for ultrasound-guided cortisone injections to both hips.  Both injection significantly helped for approximately 6 months.  His pain has returned and is started to worsen.  The pain he has is to the groin and anterior thigh and is aggravated primarily with walking long distances.  He does take Tylenol with moderate relief of symptoms.  Review of Systems as detailed in HPI.  All others reviewed and are negative.   Objective: Vital Signs: There were no vitals taken for this visit.  Physical Exam well-developed well-nourished gentleman in no acute  distress.  Alert and oriented x3.  Ortho Exam examination of both hips reveals a minimally positive logroll.  Moderately positive FADIR.  Negative straight leg raise.  Is neurovascular intact distally.  Specialty Comments:  No specialty comments available.  Imaging: US Guided Needle Placement  Result Date: 12/12/2019 Please see Notes tab for imaging impression.  XR Pelvis 1-2 Views  Result Date: 12/12/2019 X-rays demonstrate moderately severe degenerative changes of both hips    PMFS History: Patient Active Problem List   Diagnosis Date Noted  . Bone metastasis ([place]) 09/05/2018  . Sepsis secondary to UTI ([place]) 05/11/2016  . CAD (coronary artery disease) 05/11/2016  . Hyperlipidemia 05/11/2016  . Morbid (severe) obesity due to excess calories ([place]) 03/21/2016  . Sleep apnea 09/18/2015  . Dyspnea and respiratory abnormality 09/18/2015  . Dyspnea 10/11/2012  . Essential hypertension 10/11/2012  . Lung cancer ([place]) 03/23/2012   Past Medical History:  Diagnosis Date  . Hyperlipidemia   . Hypertension   . Lung cancer ([place])    lung ca dx 2010  . Myocardial infarction ([place])   . Peripheral vascular disease ([place])   . Shortness of breath   . Sleep apnea    cpap    Family History  Problem Relation Age of Onset  . Hypertension Mother   .  Lung cancer Father     Past Surgical History:  Procedure Laterality Date  . ACHILLES TENDON SURGERY    . CORONARY ARTERY BYPASS GRAFT    . FLEXIBLE SIGMOIDOSCOPY N/A 01/18/2013   Procedure: FLEXIBLE SIGMOIDOSCOPY;  Surgeon: Arta Silence, MD;  Location: [facility];  Service: Endoscopy;  Laterality: N/A;  . HERNIA REPAIR     inguinal  . HOT HEMOSTASIS N/A 01/18/2013   Procedure: HOT HEMOSTASIS (ARGON PLASMA COAGULATION/BICAP);  Surgeon: Arta Silence, MD;  Location: [facility];  Service: Endoscopy;  Laterality: N/A;  . LUNG SURGERY     Social History   Occupational History  . Not on file  Tobacco Use  . Smoking status: Former  Smoker    Packs/day: 2.00    Years: 20.00    Pack years: 40.00    Types: Cigarettes    Quit date: 10/20/2007    Years since quitting: 12.1  . Smokeless tobacco: Never Used  Substance and Sexual Activity  . Alcohol use: Not Currently  . Drug use: No  . Sexual activity: Not on file

## 2020-01-09 DIAGNOSIS — I10 Essential (primary) hypertension: Secondary | ICD-10-CM | POA: Diagnosis not present

## 2020-01-10 ENCOUNTER — Other Ambulatory Visit: Payer: Self-pay | Admitting: Cardiology

## 2020-01-10 ENCOUNTER — Telehealth: Payer: Self-pay | Admitting: Cardiology

## 2020-01-10 DIAGNOSIS — I10 Essential (primary) hypertension: Secondary | ICD-10-CM

## 2020-01-10 DIAGNOSIS — E875 Hyperkalemia: Secondary | ICD-10-CM

## 2020-01-10 LAB — COMPREHENSIVE METABOLIC PANEL
ALT: 66 IU/L — ABNORMAL HIGH (ref 0–44)
AST: 47 IU/L — ABNORMAL HIGH (ref 0–40)
Albumin/Globulin Ratio: 1.7 (ref 1.2–2.2)
Albumin: 4.3 g/dL (ref 3.8–4.8)
Alkaline Phosphatase: 62 IU/L (ref 39–117)
BUN/Creatinine Ratio: 15 (ref 10–24)
BUN: 14 mg/dL (ref 8–27)
Bilirubin Total: 0.6 mg/dL (ref 0.0–1.2)
CO2: 24 mmol/L (ref 20–29)
Calcium: 10.6 mg/dL — ABNORMAL HIGH (ref 8.6–10.2)
Chloride: 103 mmol/L (ref 96–106)
Creatinine, Ser: 0.96 mg/dL (ref 0.76–1.27)
GFR calc Af Amer: 95 mL/min/{1.73_m2} (ref >59)
GFR calc non Af Amer: 83 mL/min/{1.73_m2} (ref >59)
Globulin, Total: 2.6 g/dL (ref 1.5–4.5)
Glucose: 77 mg/dL (ref 65–99)
Potassium: 5.8 mmol/L — ABNORMAL HIGH (ref 3.5–5.2)
Sodium: 141 mmol/L (ref 134–144)
Total Protein: 6.9 g/dL (ref 6.0–8.5)

## 2020-01-10 LAB — CBC
Hematocrit: 46.3 % (ref 37.5–51.0)
Hemoglobin: 15.8 g/dL (ref 13.0–17.7)
MCH: 33.1 pg — ABNORMAL HIGH (ref 26.6–33.0)
MCHC: 34.1 g/dL (ref 31.5–35.7)
MCV: 97 fL (ref 79–97)
Platelets: 262 10*3/uL (ref 150–450)
RBC: 4.77 x10E6/uL (ref 4.14–5.80)
RDW: 13.6 % (ref 11.6–15.4)
WBC: 7.3 10*3/uL (ref 3.4–10.8)

## 2020-01-10 LAB — LIPID PANEL
Chol/HDL Ratio: 4.1 ratio (ref 0.0–5.0)
Cholesterol, Total: 146 mg/dL (ref 100–199)
HDL: 36 mg/dL — ABNORMAL LOW (ref >39)
LDL Chol Calc (NIH): 85 mg/dL (ref 0–99)
Triglycerides: 144 mg/dL (ref 0–149)
VLDL Cholesterol Cal: 25 mg/dL (ref 5–40)

## 2020-01-10 NOTE — Progress Notes (Signed)
Potassium levels high,  patient was already contacted by my partner and has been advised to hold Linisopril. He will repeat K level in 2 days.  I spoke to patient as well. Lipids controlled and CBC normal.  Although clear instructions were given, patient was under the impression he should eat foods rich in potassium, I have extensively discussed avoidance of fruits and fruit juices and also to google foods rich in potassium, he voiced understanding.

## 2020-01-10 NOTE — Telephone Encounter (Signed)
CMP was ordered by Sean Gardner/FNP for 01/09/2020.  Reviewed by me.  Potassium elevated at 5.8.  He has never had hyperkalemia before.  He is not on any potassium supplements.  Contrary to his chart information, he is taking lisinopril 40 mg half tablet daily.    Recommend holding lisinopril for now. Instead, take metoprolol 50 mg 3 times daily to control his blood pressure.  We will repeat potassium level on 01/12/2020.  We will arrange follow-up next week to address hypertension and hyperkalemia.  After that, patient should follow-up with his PCP regarding his chronically elevated liver enzymes at some point.  CAD/PAD f/u w/our office can be arranged in future.   Sean Mormon, MD [facility] [place] Pager: [phone] Office: [phone]

## 2020-01-12 DIAGNOSIS — E875 Hyperkalemia: Secondary | ICD-10-CM | POA: Diagnosis not present

## 2020-01-13 LAB — POTASSIUM: Potassium: 5 mmol/L (ref 3.5–5.2)

## 2020-01-17 ENCOUNTER — Other Ambulatory Visit: Payer: Self-pay

## 2020-01-17 ENCOUNTER — Ambulatory Visit: Payer: Medicare Other | Admitting: Cardiology

## 2020-01-17 ENCOUNTER — Encounter: Payer: Self-pay | Admitting: Cardiology

## 2020-01-17 VITALS — BP 156/81 | HR 59 | Temp 97.6°F | Ht 72.0 in | Wt 284.5 lb

## 2020-01-17 DIAGNOSIS — E875 Hyperkalemia: Secondary | ICD-10-CM

## 2020-01-17 DIAGNOSIS — Z951 Presence of aortocoronary bypass graft: Secondary | ICD-10-CM | POA: Diagnosis not present

## 2020-01-17 DIAGNOSIS — I2510 Atherosclerotic heart disease of native coronary artery without angina pectoris: Secondary | ICD-10-CM

## 2020-01-17 DIAGNOSIS — Z85118 Personal history of other malignant neoplasm of bronchus and lung: Secondary | ICD-10-CM | POA: Diagnosis not present

## 2020-01-17 DIAGNOSIS — Z902 Acquired absence of lung [part of]: Secondary | ICD-10-CM

## 2020-01-17 DIAGNOSIS — I10 Essential (primary) hypertension: Secondary | ICD-10-CM

## 2020-01-17 DIAGNOSIS — Z87891 Personal history of nicotine dependence: Secondary | ICD-10-CM | POA: Diagnosis not present

## 2020-01-17 DIAGNOSIS — I739 Peripheral vascular disease, unspecified: Secondary | ICD-10-CM | POA: Diagnosis not present

## 2020-01-17 DIAGNOSIS — E782 Mixed hyperlipidemia: Secondary | ICD-10-CM | POA: Diagnosis not present

## 2020-01-17 DIAGNOSIS — G4733 Obstructive sleep apnea (adult) (pediatric): Secondary | ICD-10-CM | POA: Diagnosis not present

## 2020-01-17 MED ORDER — EZETIMIBE 10 MG PO TABS: 10.0000 mg | ORAL_TABLET | Freq: Every day | ORAL | 3 refills | Status: DC

## 2020-01-17 MED ORDER — HYDRALAZINE HCL 25 MG PO TABS: 25.0000 mg | ORAL_TABLET | Freq: Three times a day (TID) | ORAL | 0 refills | Status: DC

## 2020-01-17 NOTE — Patient Instructions (Signed)
Please remember to bring in your medication bottles in at the next visit.   New Medications that were added at today's visit:  Hydralazine 25 mg p.o. 3 times daily Zetia 10 mg p.o. daily  Medications that were discontinued at today's visit: Lisinopril   Please plan follow-up with your primary care physician regarding hyperkalemia.  Recommend follow up with your PCP as scheduled.

## 2020-01-17 NOTE — Progress Notes (Signed)
Sean Gardner Date of Birth: [birth year] MRN: [id number] Primary Care Provider:Swayne, Shanon Brow, MD  Date: 01/21/20 Last Office Visit: 11/27/2019  Chief Complaint  Patient presents with  . Hypertension  . Hyperlipidemia  . Follow-up    HPI  Sean Gardner is a 64 y.o.  male who presents to the office with a chief complaint of " blood pressure follow-up." Patient's past medical history and cardiovascular risk factors include: Established coronary disease with prior CABG in 2001, obesity, OSA on CPAP, mixed hyperlipidemia, hypertension, former smoker, history of lung cancer status post chemotherapy and left lower lobectomy, advanced age.   Patient was last seen in the office on 11/27/2019 by Miquel Dunn, MSN, APRN, FNP-C.  At last office visit patient was noted to have abnormal ABIs suggestive of peripheral vascular disease in the setting of claudication the plan was to treat him medically until he gets an orthopedic evaluation given his bilateral hip pain.  Patient states that the pain in the hips are better after getting steroid injections.  But the right hip is worse than the left.  There are no current discussions of undergoing hip surgery.  He is currently on aspirin, Plavix, and statin therapy.  Since last office visit patient states that his symptoms of claudication are well controlled.  Since last office visit he also had blood work done which noted to show hyperkalemia.  The blood work was performed in the setting of possibly increasing his lisinopril given his underlying blood pressures.  However, given the hyperkalemia he was asked to hold off taking lisinopril and increase his beta-blocker therapy.  He had repeat blood work done on January 12, 2020 which noted a potassium level of 5.  Patient states that he was following up with his primary care provider in regards to the work-up of hyperkalemia.  His home blood pressures continue to be elevated with systolic blood pressures ranging  between 140-150 mmHg.  ALLERGIES: Allergies  Allergen Reactions  . Zolpidem Tartrate Other (See Comments)    Felt fuzzy and hallucinations   MEDICATION LIST PRIOR TO VISIT: Current Outpatient Medications on File Prior to Visit  Medication Sig Dispense Refill  . aspirin 81 MG tablet Take 81 mg by mouth daily.    . clopidogrel (PLAVIX) 75 MG tablet Take 1 tablet (75 mg total) by mouth daily before breakfast. 1 tablet 0  . diphenhydramine-acetaminophen (TYLENOL PM) 25-500 MG TABS tablet Take 1 tablet by mouth at bedtime as needed (pain).    . metoprolol (LOPRESSOR) 50 MG tablet Take 50 mg by mouth in the morning, at noon, and at bedtime. Hold if systolic blood pressure (top blood pressure number) less than 100 mmHg or heart rate less than 60 bpm (pulse).    . nitroGLYCERIN (NITROSTAT) 0.4 MG SL tablet nitroglycerin 0.4 mg sublingual tablet    . omeprazole (PRILOSEC) 20 MG capsule Take 20 mg by mouth daily.    . rosuvastatin (CRESTOR) 40 MG tablet Take 40 mg by mouth daily.    . SYMBICORT 160-4.5 MCG/ACT inhaler Inhale 2 puffs into the lungs 2 (two) times daily.    . Vitamin D, Ergocalciferol, (DRISDOL) 50000 UNITS CAPS Take 50,000 Units by mouth every Saturday.     Marland Kitchen amLODipine (NORVASC) 5 MG tablet Take 1 tablet (5 mg total) by mouth daily. 90 tablet 3   Current Facility-Administered Medications on File Prior to Visit  Medication Dose Route Frequency Provider Last Rate Last Admin  . methylPREDNISolone acetate (DEPO-MEDROL) injection 40 mg  40 mg Intra-articular Once Hilts, Michael, MD      . methylPREDNISolone acetate (DEPO-MEDROL) injection 40 mg  40 mg Intra-articular Once Hilts, Legrand Como, MD        PAST MEDICAL HISTORY: Past Medical History:  Diagnosis Date  . Claudication in peripheral vascular disease ([place])   . Hyperlipidemia   . Hypertension   . Lung cancer ([place])    lung ca dx 2010  . Myocardial infarction ([place])   . Peripheral vascular disease ([place])   . Shortness of breath    . Sleep apnea    cpap    PAST SURGICAL HISTORY: Past Surgical History:  Procedure Laterality Date  . ACHILLES TENDON SURGERY    . CORONARY ARTERY BYPASS GRAFT    . FLEXIBLE SIGMOIDOSCOPY N/A 01/18/2013   Procedure: FLEXIBLE SIGMOIDOSCOPY;  Surgeon: Arta Silence, MD;  Location: [facility];  Service: Endoscopy;  Laterality: N/A;  . HERNIA REPAIR     inguinal  . HOT HEMOSTASIS N/A 01/18/2013   Procedure: HOT HEMOSTASIS (ARGON PLASMA COAGULATION/BICAP);  Surgeon: Arta Silence, MD;  Location: [facility];  Service: Endoscopy;  Laterality: N/A;  . LUNG SURGERY      FAMILY HISTORY: The patient's family history includes Hypertension in his mother; Lung cancer in his father.   SOCIAL HISTORY:  The patient  reports that he quit smoking about 12 years ago. His smoking use included cigarettes. He has a 40.00 pack-year smoking history. He has never used smokeless tobacco. He reports previous alcohol use. He reports that he does not use drugs.  ROS  PHYSICAL EXAM: Vitals with BMI 01/17/2020 11/27/2019 11/27/2019  Height 6\' 0"  - 6\' 0"   Weight 284 lbs 8 oz - 292 lbs 3 oz  BMI 25.36 - 64.40  Systolic 347 425 956  Diastolic 81 80 79  Pulse 59 - 65    CONSTITUTIONAL: Well-developed and well-nourished. No acute distress.  SKIN: Skin is warm and dry. No rash noted. No cyanosis. No pallor. No jaundice HEAD: Normocephalic and atraumatic.  EYES: No scleral icterus MOUTH/THROAT: Moist oral membranes.  NECK: No JVD present. No thyromegaly noted. No carotid bruits  LYMPHATIC: No visible cervical adenopathy.  CHEST Normal respiratory effort. No intercostal retractions  LUNGS: Decreased breath sounds bilaterally.  No stridor. No wheezes. No rales.  CARDIOVASCULAR: Regular rate and rhythm, positive S1-S2, no murmurs rubs or gallops appreciated. ABDOMINAL: Obese, soft, nontender, nondistended, hernia noted.  No apparent ascites.  EXTREMITIES: No peripheral edema, decreased pulses  bilaterally. HEMATOLOGIC: No significant bruising NEUROLOGIC: Oriented to person, place, and time. Nonfocal. Normal muscle tone.  PSYCHIATRIC: Normal mood and affect. Normal behavior. Cooperative  CARDIAC DATABASE: Coronary bypass grafting surgery in 2011: LIMA to the LAD, SVG to diagonal branch, free radial artery to OM branch.  EKG: 10/31/2019: Normal sinus rhythm at rate of 76 bpm, normal axis, RBBB. Diffuse T wave inversion  Echocardiogram: 11/04/2017: LVEF 38%, grade 1 diastolic impairment, moderate concentric hypertrophy, normal left atrial pressure, moderately dilated left atrium, right ventricle is mildly dilated with normal function.  Mild to moderate MR.  Stress Testing:  Lexiscan Myoview 12/20/2017: Gated SPECT imaging demonstrates hypokinesis of the apical anterior myocardial wall(s). The left ventricular ejection fraction was calculated or visually estimated to be 59%. SPECT images demonstrate small, fixed perfusion abnormality of moderate intensity in the apical anterior myocardial wall(s). SPECT images also demonstrate small, reversible perfusion abnormality of mild intensity in mid inferior, basal inferior myocardium. This suggests mild inferior ischemia, and possible anteroapical infarct. Low to intermediate risk  study.  Lower extremity arterial duplex: 11/05/2019:  Abnormal ABIs bilaterally suggestive of peripheral vascular disease.   Moderately abnormal pulse volume recordings at the level of bilateral ankles. Arterial duplex (right) demonstrates multiphasic waveform up to the popliteal artery.  Posterior tibial artery and anterior tibial artery noted to have monophasic waveform. Arterial duplex (left) demonstrates multiphasic waveform except at the level of mid SFA.  Mid SFA demonstrates a monophasic waveform with near occlusion and diffuse plaque. Conclusions: This exam reveals moderately decreased perfusion of the right lower extremity, noted at the post tibial artery level  (ABI 0.67) and moderately decreased perfusion of the left lower extremity, noted at the post tibial artery level (ABI 0.73).  LABORATORY DATA: CBC Latest Ref Rng & Units 01/09/2020 09/05/2018 08/09/2018  WBC 3.4 - 10.8 x10E3/uL 7.3 6.8 8.8  Hemoglobin 13.0 - 17.7 g/dL 15.8 16.1 15.4  Hematocrit 37.5 - 51.0 % 46.3 48.3 46.1  Platelets 150 - 450 x10E3/uL 262 236 262    CMP Latest Ref Rng & Units 01/12/2020 01/09/2020 09/05/2018  Glucose 65 - 99 mg/dL - 77 96  BUN 8 - 27 mg/dL - 14 16  Creatinine 0.76 - 1.27 mg/dL - 0.96 1.01  Sodium 134 - 144 mmol/L - 141 142  Potassium 3.5 - 5.2 mmol/L 5.0 5.8(H) 4.5  Chloride 96 - 106 mmol/L - 103 110  CO2 20 - 29 mmol/L - 24 25  Calcium 8.6 - 10.2 mg/dL - 10.6(H) 10.8(H)  Total Protein 6.0 - 8.5 g/dL - 6.9 7.2  Total Bilirubin 0.0 - 1.2 mg/dL - 0.6 0.7  Alkaline Phos 39 - 117 IU/L - 62 60  AST 0 - 40 IU/L - 47(H) 48(H)  ALT 0 - 44 IU/L - 66(H) 60(H)    Lipid Panel     Component Value Date/Time   CHOL 146 01/09/2020 1155   TRIG 144 01/09/2020 1155   HDL 36 (L) 01/09/2020 1155   CHOLHDL 4.1 01/09/2020 1155   CHOLHDL 7.9 08/13/2010 1200   VLDL 33 08/13/2010 1200   LDLCALC 85 01/09/2020 1155   LABVLDL 25 01/09/2020 1155    Lab Results  Component Value Date   HGBA1C  08/13/2010    5.4 (NOTE)                                                                       According to the ADA Clinical Practice Recommendations for 2011, when HbA1c is used as a screening test:   >=6.5%   Diagnostic of Diabetes Mellitus           (if abnormal result  is confirmed)  5.7-6.4%   Increased risk of developing Diabetes Mellitus  References:Diagnosis and Classification of Diabetes Mellitus,Diabetes FBPZ,0258,52(DPOEU 1):S62-S69 and Standards of Medical Care in         Diabetes - 2011,Diabetes MPNT,6144,31  (Suppl 1):S11-S61.   No components found for: NTPROBNP No results found for: TSH  Cardiac Panel (last 3 results) No results for input(s): CKTOTAL, CKMB,  TROPONINIHS, RELINDX in the last 72 hours.  FINAL MEDICATION LIST END OF ENCOUNTER: Meds ordered this encounter  Medications  . hydrALAZINE (APRESOLINE) 25 MG tablet    Sig: Take 1 tablet (25 mg total) by mouth 3 (three) times daily.  Dispense:  90 tablet    Refill:  0  . ezetimibe (ZETIA) 10 MG tablet    Sig: Take 1 tablet (10 mg total) by mouth daily.    Dispense:  90 tablet    Refill:  3    Medications Discontinued During This Encounter  Medication Reason  . lisinopril (PRINIVIL,ZESTRIL) 40 MG tablet Discontinued by provider     Current Outpatient Medications:  .  aspirin 81 MG tablet, Take 81 mg by mouth daily., Disp: , Rfl:  .  clopidogrel (PLAVIX) 75 MG tablet, Take 1 tablet (75 mg total) by mouth daily before breakfast., Disp: 1 tablet, Rfl: 0 .  diphenhydramine-acetaminophen (TYLENOL PM) 25-500 MG TABS tablet, Take 1 tablet by mouth at bedtime as needed (pain)., Disp: , Rfl:  .  metoprolol (LOPRESSOR) 50 MG tablet, Take 50 mg by mouth in the morning, at noon, and at bedtime. Hold if systolic blood pressure (top blood pressure number) less than 100 mmHg or heart rate less than 60 bpm (pulse)., Disp: , Rfl:  .  nitroGLYCERIN (NITROSTAT) 0.4 MG SL tablet, nitroglycerin 0.4 mg sublingual tablet, Disp: , Rfl:  .  omeprazole (PRILOSEC) 20 MG capsule, Take 20 mg by mouth daily., Disp: , Rfl:  .  rosuvastatin (CRESTOR) 40 MG tablet, Take 40 mg by mouth daily., Disp: , Rfl:  .  SYMBICORT 160-4.5 MCG/ACT inhaler, Inhale 2 puffs into the lungs 2 (two) times daily., Disp: , Rfl:  .  Vitamin D, Ergocalciferol, (DRISDOL) 50000 UNITS CAPS, Take 50,000 Units by mouth every Saturday. , Disp: , Rfl:  .  amLODipine (NORVASC) 5 MG tablet, Take 1 tablet (5 mg total) by mouth daily., Disp: 90 tablet, Rfl: 3 .  ezetimibe (ZETIA) 10 MG tablet, Take 1 tablet (10 mg total) by mouth daily., Disp: 90 tablet, Rfl: 3 .  hydrALAZINE (APRESOLINE) 25 MG tablet, Take 1 tablet (25 mg total) by mouth 3 (three)  times daily., Disp: 90 tablet, Rfl: 0  Current Facility-Administered Medications:  .  methylPREDNISolone acetate (DEPO-MEDROL) injection 40 mg, 40 mg, Intra-articular, Once, Hilts, Michael, MD .  methylPREDNISolone acetate (DEPO-MEDROL) injection 40 mg, 40 mg, Intra-articular, Once, Hilts, Michael, MD  IMPRESSION:    ICD-10-CM   1. Atherosclerosis of native coronary artery of native heart without angina pectoris  I25.10 ezetimibe (ZETIA) 10 MG tablet  2. S/P CABG x 3  Z95.1 ezetimibe (ZETIA) 10 MG tablet  3. Claudication in peripheral vascular disease (HCC)  I73.9   4. Essential hypertension  I10 hydrALAZINE (APRESOLINE) 25 MG tablet  5. OSA (obstructive sleep apnea)  G47.33   6. Mixed hyperlipidemia  E78.2 ezetimibe (ZETIA) 10 MG tablet  7. Hyperkalemia  E87.5   8. Former smoker  Z87.891   32. Hx of cancer of lung  Z85.118   10. History of left lower lobe lung lobectomy   Z90.2      RECOMMENDATIONS: Sean Gardner is a 64 y.o. male whose past medical history and cardiovascular risk factors include: Established coronary disease with prior CABG in 2001, obesity, OSA on CPAP, mixed hyperlipidemia, hypertension, former smoker, history of lung cancer status post chemotherapy and left lower lobectomy, advanced age.  Established coronary artery disease with prior CABG without angina pectoris:  Continue aspirin and Plavix.  Continue beta-blocker therapy.  Patient has sublingual nitroglycerin tablets to use on as needed basis.  None new since last office visit  Continue Crestor 40 mg p.o. nightly  Continue amlodipine.  Discontinue lisinopril given the recent hyperkalemia.  Patient is asked to follow-up with PCP regarding the work-up of hyperkalemia.  If the work-up is unremarkable would recommend restarting ACE inhibitors or ARB use.  Till then will initiate hydralazine 25 mg p.o. 3 times daily.  Last echocardiogram results reviewed.  Last nuclear stress test noted above.  It was  reported to be low to intermediate risk.  Patient is asymptomatic from a cardiovascular standpoint and would like to be treated medically for now.  Patient is LDL level is not currently at goal.  We will add Zetia  Claudication with peripheral vascular disease:  Patient states that his pain overall is better controlled.  The pain that he currently has is multifactorial from his underlying peripheral vascular disease and also osteoarthritis.  Would recommend additional work-up if his symptoms start to become progressive.  Continue aspirin, Plavix, Crestor.  We will add Zetia to optimize his cholesterol.  Benign essential hypertension: . Office and home blood pressure are not at goal.  . Medication reconciled.  . Patient is asked to keep a log of both blood pressure and pulse so that medications can be titrated based on a blood pressure trend as opposed to isolated blood pressure readings in the office.  . If the blood pressure is consistently greater than 150mmHg patient is asked to call the office or his primary care provider for medication titration sooner than the next office visit.  . Low salt diet recommended. A diet that is rich in fruits, vegetables, legumes, and low-fat dairy products and low in snacks, sweets, and meats (such as the Dietary Approaches to Stop Hypertension [DASH] diet).   Former smoker: Educated on the importance of continued smoking cessation.  Obesity, due to excess calories: . Body mass index is 38.59 kg/m. . I reviewed with the patient the importance of diet, regular physical activity/exercise, weight loss.   . Patient is educated on increasing physical activity gradually as tolerated.  With the goal of moderate intensity exercise for 30 minutes a day 5 days a week.  No orders of the defined types were placed in this encounter.  --Continue cardiac medications as reconciled in final medication list. --Return in about 4 weeks (around 02/14/2020). Or sooner if  needed. --Continue follow-up with your primary care physician regarding the management of your other chronic comorbid conditions.  Patient's questions and concerns were addressed to his satisfaction. He voices understanding of the instructions provided during this encounter.   This note was created using a voice recognition software as a result there may be grammatical errors inadvertently enclosed that do not reflect the nature of this encounter. Every attempt is made to correct such errors.  Rex Kras, [place], [facility]  Pager: [phone] Office: [phone]

## 2020-01-21 ENCOUNTER — Encounter: Payer: Self-pay | Admitting: Cardiology

## 2020-01-25 DIAGNOSIS — E875 Hyperkalemia: Secondary | ICD-10-CM | POA: Diagnosis not present

## 2020-02-09 ENCOUNTER — Other Ambulatory Visit: Payer: Self-pay | Admitting: Cardiology

## 2020-02-09 DIAGNOSIS — E875 Hyperkalemia: Secondary | ICD-10-CM | POA: Diagnosis not present

## 2020-02-09 DIAGNOSIS — I10 Essential (primary) hypertension: Secondary | ICD-10-CM

## 2020-02-14 ENCOUNTER — Other Ambulatory Visit: Payer: Self-pay

## 2020-02-14 MED ORDER — AMLODIPINE BESYLATE 5 MG PO TABS: 5.0000 mg | ORAL_TABLET | Freq: Every day | ORAL | 3 refills | Status: DC

## 2020-02-14 NOTE — Progress Notes (Signed)
Primary Physician/Referring:  [name], MD  Patient ID: Sean Gardner, male    DOB: 1956/02/05, 64 y.o.   MRN: [id number]  Chief Complaint  Patient presents with  . Coronary Artery Disease  . Follow-up    4 week   HPI:    Sean Gardner is a 64 y.o.  male who presents to the office with a chief complaint of " blood pressure follow-up." Patient's past medical history and cardiovascular risk factors include: Established coronary disease with prior CABG in 2001, obesity, OSA on CPAP, mixed hyperlipidemia, hypertension, former smoker, history of lung cancer status post chemotherapy and left lower lobectomy. He has PAD and symptoms of claudication have improved on medical therapy. He also has DJD bilateral hips.    Lisinopril has been discontinued due to hyperkalemia, beta-blocker dose was increased.  Patient has noticed improvement in blood pressure and overall states that he is feeling well.  He has not had any chest pain, dyspnea.   Past Medical History:  Diagnosis Date  . Claudication in peripheral vascular disease ([place])   . Hyperlipidemia   . Hypertension   . Lung cancer ([place])    lung ca dx 2010  . Myocardial infarction ([place])   . Peripheral vascular disease ([place])   . Shortness of breath   . Sleep apnea    cpap   Past Surgical History:  Procedure Laterality Date  . ACHILLES TENDON SURGERY    . CORONARY ARTERY BYPASS GRAFT    . FLEXIBLE SIGMOIDOSCOPY N/A 01/18/2013   Procedure: FLEXIBLE SIGMOIDOSCOPY;  Surgeon: Arta Silence, MD;  [facility];  Service: Endoscopy;  Laterality: N/A;  . HERNIA REPAIR     inguinal  . HOT HEMOSTASIS N/A 01/18/2013   Procedure: HOT HEMOSTASIS (ARGON PLASMA COAGULATION/BICAP);  Surgeon: Arta Silence, MD;  Location: [facility];  Service: Endoscopy;  Laterality: N/A;  . LUNG SURGERY     Family History  Problem Relation Age of Onset  . Hypertension Mother   . Lung cancer Father     Social History   Tobacco Use  . Smoking  status: Former Smoker    Packs/day: 2.00    Years: 20.00    Pack years: 40.00    Types: Cigarettes    Quit date: 10/20/2007    Years since quitting: 12.3  . Smokeless tobacco: Never Used  Substance Use Topics  . Alcohol use: Not Currently   Marital Status: Married  ROS  Review of Systems  Cardiovascular: Positive for claudication. Negative for chest pain, dyspnea on exertion and leg swelling.  Musculoskeletal: Positive for arthritis (right ankle), back pain and joint pain (bilateral hips).  Gastrointestinal: Negative for melena.   Objective  Blood pressure 140/70, pulse (!) 56, temperature (!) 97.4 F (36.3 C), temperature source Temporal, resp. rate 14, height 6' (1.829 m), weight 287 lb (130.2 kg), SpO2 95 %.  Vitals with BMI 02/16/2020 01/17/2020 11/27/2019  Height 6' 0" 6' 0" -  Weight 287 lbs 284 lbs 8 oz -  BMI 72.09 47.09 -  Systolic 628 366 294  Diastolic 70 81 80  Pulse 56 59 -     Physical Exam  Constitutional: Vital signs are normal. He appears well-developed and well-nourished.  Morbidly obese  Cardiovascular: Normal rate, regular rhythm, normal heart sounds and intact distal pulses. Exam reveals no gallop.  No murmur heard. Pulses:      Femoral pulses are 2+ on the right side and 2+ on the left side.  Popliteal pulses are 0 on the right side and 0 on the left side.       Dorsalis pedis pulses are 0 on the right side and 0 on the left side.       Posterior tibial pulses are 0 on the right side and 2+ on the left side.  No JVD. No leg edema.  Pulmonary/Chest: Effort normal and breath sounds normal. No accessory muscle usage. No respiratory distress.  Abdominal: Soft. Bowel sounds are normal. A hernia is present. Hernia confirmed positive in the ventral area (reducible).  Vitals reviewed.  Laboratory examination:   Recent Labs    01/09/20 1155 01/12/20 1145  NA 141  --   K 5.8* 5.0  CL 103  --   CO2 24  --   GLUCOSE 77  --   BUN 14  --   CREATININE  0.96  --   CALCIUM 10.6*  --   GFRNONAA 83  --   GFRAA 95  --    CrCl cannot be calculated (Patient's most recent lab result is older than the maximum 21 days allowed.).  CMP Latest Ref Rng & Units 01/12/2020 01/09/2020 09/05/2018  Glucose 65 - 99 mg/dL - 77 96  BUN 8 - 27 mg/dL - 14 16  Creatinine 0.76 - 1.27 mg/dL - 0.96 1.01  Sodium 134 - 144 mmol/L - 141 142  Potassium 3.5 - 5.2 mmol/L 5.0 5.8(H) 4.5  Chloride 96 - 106 mmol/L - 103 110  CO2 20 - 29 mmol/L - 24 25  Calcium 8.6 - 10.2 mg/dL - 10.6(H) 10.8(H)  Total Protein 6.0 - 8.5 g/dL - 6.9 7.2  Total Bilirubin 0.0 - 1.2 mg/dL - 0.6 0.7  Alkaline Phos 39 - 117 IU/L - 62 60  AST 0 - 40 IU/L - 47(H) 48(H)  ALT 0 - 44 IU/L - 66(H) 60(H)   CBC Latest Ref Rng & Units 01/09/2020 09/05/2018 08/09/2018  WBC 3.4 - 10.8 x10E3/uL 7.3 6.8 8.8  Hemoglobin 13.0 - 17.7 g/dL 15.8 16.1 15.4  Hematocrit 37.5 - 51.0 % 46.3 48.3 46.1  Platelets 150 - 450 x10E3/uL 262 236 262   Lipid Panel     Component Value Date/Time   CHOL 146 01/09/2020 1155   TRIG 144 01/09/2020 1155   HDL 36 (L) 01/09/2020 1155   CHOLHDL 4.1 01/09/2020 1155   CHOLHDL 7.9 08/13/2010 1200   VLDL 33 08/13/2010 1200   LDLCALC 85 01/09/2020 1155   HEMOGLOBIN A1C Lab Results  Component Value Date   HGBA1C  08/13/2010    5.4 (NOTE)                                                                       According to the ADA Clinical Practice Recommendations for 2011, when HbA1c is used as a screening test:   >=6.5%   Diagnostic of Diabetes Mellitus           (if abnormal result  is confirmed)  5.7-6.4%   Increased risk of developing Diabetes Mellitus  References:Diagnosis and Classification of Diabetes Mellitus,Diabetes ZDGL,8756,43(PIRJJ 1):S62-S69 and Standards of Medical Care in         Diabetes - 2011,Diabetes Care,2011,34  (Suppl 1):S11-S61.   MPG 108 08/13/2010  TSH No results for input(s): TSH in the last 8760 hours.  External labs:   02/09/2020: Serum glucose  8900 g, BUN 15, creatinine 0.90, EGFR >80 mL.  Potassium 5.1.  Medications and allergies   Allergies  Allergen Reactions  . Lisinopril Other (See Comments)    Hyperkalemia  . Zolpidem Tartrate Other (See Comments)    Felt fuzzy and hallucinations     Current Outpatient Medications  Medication Instructions  . amLODipine (NORVASC) 5 mg, Oral, Daily  . aspirin 81 mg, Daily  . clindamycin (CLEOCIN) 150 mg, Oral, 3 times daily  . clopidogrel (PLAVIX) 75 mg, Oral, Daily before breakfast  . diphenhydramine-acetaminophen (TYLENOL PM) 25-500 MG TABS tablet 1 tablet, Oral, At bedtime PRN  . ezetimibe (ZETIA) 10 mg, Oral, Daily  . hydrALAZINE (APRESOLINE) 25 MG tablet TAKE 1 TABLET BY MOUTH THREE TIMES A DAY  . metoprolol tartrate (LOPRESSOR) 50 mg, Oral, 3 times daily, Hold if systolic blood pressure (top blood pressure number) less than 100 mmHg or heart rate less than 60 bpm (pulse).  . nitroGLYCERIN (NITROSTAT) 0.4 mg, Sublingual, Every 5 min PRN  . omeprazole (PRILOSEC) 20 mg, Oral, Daily  . rosuvastatin (CRESTOR) 40 mg, Oral, Daily  . SYMBICORT 160-4.5 MCG/ACT inhaler 2 puffs, Inhalation, 2 times daily  . Vitamin D (Ergocalciferol) (DRISDOL) 50,000 Units, Oral, Every Sat   Radiology:   No results found.  Cardiac Studies:   Coronary bypass grafting surgery in 2011: LIMA to the LAD, SVG to diagonal branch, free radial artery to OM branch.  Echocardiogram 11/04/2017: LVEF 53%, grade 1 diastolic impairment, moderate concentric hypertrophy, normal left atrial pressure, moderately dilated left atrium, right ventricle is mildly dilated with normal function.  Mild to moderate MR.  Stress Testing Lexiscan Myoview 12/20/2017: Gated SPECT imaging demonstrates hypokinesis of the apical anterior myocardial wall(s). The left ventricular ejection fraction was calculated or visually estimated to be 59%. SPECT images demonstrate small, fixed perfusion abnormality of moderate intensity in the apical  anterior myocardial wall(s). SPECT images also demonstrate small, reversible perfusion abnormality of mild intensity in mid inferior, basal inferior myocardium. This suggests mild inferior ischemia, and possible anteroapical infarct. Low to intermediate risk study.  Lower extremity arterial duplex 11/05/2019:  Abnormal ABIs bilaterally suggestive of peripheral vascular disease.   Moderately abnormal pulse volume recordings at the level of bilateral ankles. Arterial duplex (right) demonstrates multiphasic waveform up to the popliteal artery.  Posterior tibial artery and anterior tibial artery noted to have monophasic waveform. Arterial duplex (left) demonstrates multiphasic waveform except at the level of mid SFA.  Mid SFA demonstrates a monophasic waveform with near occlusion and diffuse plaque. Conclusions: This exam reveals moderately decreased perfusion of the right lower extremity, noted at the post tibial artery level (ABI 0.67) and moderately decreased perfusion of the left lower extremity, noted at the post tibial artery level (ABI 0.73).  EKG  10/31/2019: Normal sinus rhythm at rate of 76 bpm, normal axis, RBBB. Diffuse T wave inversion.  Assessment     ICD-10-CM   1. Atherosclerosis of native coronary artery of native heart without angina pectoris  I25.10 nitroGLYCERIN (NITROSTAT) 0.4 MG SL tablet  2. Claudication in peripheral vascular disease (HCC)  I73.9   3. Essential hypertension  I10   4. OSA (obstructive sleep apnea)  G47.33   5. Hyperkalemia  E87.5      No orders of the defined types were placed in this encounter.   Medications Discontinued During This Encounter  Medication Reason  .  nitroGLYCERIN (NITROSTAT) 0.4 MG SL tablet Patient Preference    Recommendations:   Sean Gardner is a 64 y.o. male who presents to the office with a chief complaint of " blood pressure follow-up." Patient's past medical history and cardiovascular risk factors include: Established coronary  disease with prior CABG in 2001, obesity, OSA on CPAP, mixed hyperlipidemia, hypertension, former smoker, history of lung cancer status post chemotherapy and left lower lobectomy. He has PAD and symptoms of claudication have improved on medical therapy. He also has DJD bilateral hips.    He was seen for evaluation of peripheral arterial disease and also hyperkalemia about 3 weeks ago, lisinopril has been discontinued.  I reviewed his labs performed by his PCP, potassium level still up at 5.1 but within normal range.  Advised him to continue to avoid high potassium diet and also kind of fruit juices.  With regard to coronary artery disease he has not had any angina pectoris and no clinical evidence of heart failure.  Symptoms of claudication has improved since being on medical therapy, continue aspirin and Plavix for now.  Unless his symptoms get worse then we could consider peripheral angiography.  I will see him back in 6 months for follow-up.  Lipids not at goal and may need to consider addition of PCSK9 inhibitors.   Adrian Prows, MD, [facility] 02/17/2020, 2:33 PM [facility] [place] Office: [phone]

## 2020-02-16 ENCOUNTER — Encounter: Payer: Self-pay | Admitting: Cardiology

## 2020-02-16 ENCOUNTER — Ambulatory Visit: Payer: Medicare Other | Admitting: Cardiology

## 2020-02-16 ENCOUNTER — Other Ambulatory Visit: Payer: Self-pay

## 2020-02-16 VITALS — BP 140/70 | HR 56 | Temp 97.4°F | Resp 14 | Ht 72.0 in | Wt 287.0 lb

## 2020-02-16 DIAGNOSIS — G4733 Obstructive sleep apnea (adult) (pediatric): Secondary | ICD-10-CM

## 2020-02-16 DIAGNOSIS — I739 Peripheral vascular disease, unspecified: Secondary | ICD-10-CM

## 2020-02-16 DIAGNOSIS — E875 Hyperkalemia: Secondary | ICD-10-CM

## 2020-02-16 DIAGNOSIS — I10 Essential (primary) hypertension: Secondary | ICD-10-CM

## 2020-02-16 DIAGNOSIS — I2510 Atherosclerotic heart disease of native coronary artery without angina pectoris: Secondary | ICD-10-CM | POA: Diagnosis not present

## 2020-02-19 ENCOUNTER — Ambulatory Visit: Payer: Medicare Other | Admitting: Cardiology

## 2020-03-26 ENCOUNTER — Encounter: Payer: Self-pay | Admitting: Orthopaedic Surgery

## 2020-03-26 ENCOUNTER — Ambulatory Visit: Payer: Self-pay

## 2020-03-26 ENCOUNTER — Ambulatory Visit (INDEPENDENT_AMBULATORY_CARE_PROVIDER_SITE_OTHER): Payer: Medicare Other | Admitting: Orthopaedic Surgery

## 2020-03-26 DIAGNOSIS — M1611 Unilateral primary osteoarthritis, right hip: Secondary | ICD-10-CM

## 2020-03-26 DIAGNOSIS — M1612 Unilateral primary osteoarthritis, left hip: Secondary | ICD-10-CM | POA: Diagnosis not present

## 2020-03-26 DIAGNOSIS — I2510 Atherosclerotic heart disease of native coronary artery without angina pectoris: Secondary | ICD-10-CM | POA: Diagnosis not present

## 2020-03-26 NOTE — Progress Notes (Signed)
Office Visit Note   Patient: Sean Gardner           Date of Birth: 04/25/1956           MRN: [id number] Visit Date: 03/26/2020              Requested by: [name], MD [address] [place],  [place] [address] PCP: [name], MD   Assessment & Plan: Visit Diagnoses:  1. Primary osteoarthritis of right hip   2. Primary osteoarthritis of left hip     Plan: Impression is left greater than right hip DJD.  At this point he feels that he is ready for hip replacement later this year.  He would like to try another cortisone injection to get him through the summer.  He is on Plavix and aspirin due to previous history of MI.  We will need to obtain preoperative cardiac clearance.  He will need to stop Plavix 7 days in advance.  He may continue to take his baby aspirin.  Questions encouraged and answered.  Risk benefits rehab recovery regarding the surgery were reviewed in detail.  Follow-Up Instructions: Return if symptoms worsen or fail to improve.   Orders:  Orders Placed This Encounter  Procedures  . XR HIPS BILAT W OR W/O PELVIS 3-4 VIEWS  . US Guided Needle Placement - No Linked Charges   No orders of the defined types were placed in this encounter.     Procedures: No procedures performed   Clinical Data: No additional findings.   Subjective: Chief Complaint  Patient presents with  . Left Hip - Pain    Sean Gardner is a 64 year old gentleman here for follow-up of bilateral hip DJD.  His previous cortisone injection gave him some temporary relief.  He is leaning towards hip replacement surgery later this year.  He would like to try to get through the summer first.   Review of Systems   Objective: Vital Signs: There were no vitals taken for this visit.  Physical Exam  Ortho Exam Hip exams are unchanged. Specialty Comments:  No specialty comments available.  Imaging: XR HIPS BILAT W OR W/O PELVIS 3-4 VIEWS  Result Date: 03/26/2020 Advanced  bilateral hip DJD    PMFS History: Patient Active Problem List   Diagnosis Date Noted  . Hyperkalemia 01/10/2020  . Bone metastasis ([place]) 09/05/2018  . Sepsis secondary to UTI ([place]) 05/11/2016  . CAD (coronary artery disease) 05/11/2016  . Hyperlipidemia 05/11/2016  . Morbid (severe) obesity due to excess calories ([place]) 03/21/2016  . Sleep apnea 09/18/2015  . Dyspnea and respiratory abnormality 09/18/2015  . Dyspnea 10/11/2012  . Essential hypertension 10/11/2012  . Lung cancer ([place]) 03/23/2012   Past Medical History:  Diagnosis Date  . Claudication in peripheral vascular disease ([place])   . Hyperlipidemia   . Hypertension   . Lung cancer ([place])    lung ca dx 2010  . Myocardial infarction ([place])   . Peripheral vascular disease ([place])   . Shortness of breath   . Sleep apnea    cpap    Family History  Problem Relation Age of Onset  . Hypertension Mother   . Lung cancer Father     Past Surgical History:  Procedure Laterality Date  . ACHILLES TENDON SURGERY    . CORONARY ARTERY BYPASS GRAFT    . FLEXIBLE SIGMOIDOSCOPY N/A 01/18/2013   Procedure: FLEXIBLE SIGMOIDOSCOPY;  Surgeon: Arta Silence, MD;  [facility];  Service: Endoscopy;  Laterality: N/A;  . HERNIA REPAIR     inguinal  . HOT HEMOSTASIS N/A 01/18/2013   Procedure: HOT HEMOSTASIS (ARGON PLASMA COAGULATION/BICAP);  Surgeon: Arta Silence, MD;  Location: [facility];  Service: Endoscopy;  Laterality: N/A;  . LUNG SURGERY     Social History   Occupational History  . Not on file  Tobacco Use  . Smoking status: Former Smoker    Packs/day: 2.00    Years: 20.00    Pack years: 40.00    Types: Cigarettes    Quit date: 10/20/2007    Years since quitting: 12.4  . Smokeless tobacco: Never Used  Substance and Sexual Activity  . Alcohol use: Not Currently  . Drug use: No  . Sexual activity: Not on file

## 2020-03-26 NOTE — Progress Notes (Signed)
Subjective: Patient is here for ultrasound-guided intra-articular bilateral hip injection.   Injections in February helped until recently.  Objective:  Pain and stiffness with bilateral IR.  Procedure: Ultrasound-guided bilateral hip injection: After sterile prep with Betadine, injected 8 cc 1% lidocaine without epinephrine and 40 mg methylprednisolone using a 22-gauge spinal needle, passing the needle through the iliofemoral ligament into the femoral head/neck junction.  Injectate seen filling both joint capsules.

## 2020-04-01 ENCOUNTER — Encounter: Payer: Self-pay | Admitting: Cardiology

## 2020-04-16 DIAGNOSIS — J449 Chronic obstructive pulmonary disease, unspecified: Secondary | ICD-10-CM | POA: Diagnosis not present

## 2020-04-16 DIAGNOSIS — C3490 Malignant neoplasm of unspecified part of unspecified bronchus or lung: Secondary | ICD-10-CM | POA: Diagnosis not present

## 2020-04-16 DIAGNOSIS — I2510 Atherosclerotic heart disease of native coronary artery without angina pectoris: Secondary | ICD-10-CM | POA: Diagnosis not present

## 2020-04-16 DIAGNOSIS — N400 Enlarged prostate without lower urinary tract symptoms: Secondary | ICD-10-CM | POA: Diagnosis not present

## 2020-04-16 DIAGNOSIS — M169 Osteoarthritis of hip, unspecified: Secondary | ICD-10-CM | POA: Diagnosis not present

## 2020-04-16 DIAGNOSIS — I10 Essential (primary) hypertension: Secondary | ICD-10-CM | POA: Diagnosis not present

## 2020-04-16 DIAGNOSIS — E782 Mixed hyperlipidemia: Secondary | ICD-10-CM | POA: Diagnosis not present

## 2020-04-16 DIAGNOSIS — Z85118 Personal history of other malignant neoplasm of bronchus and lung: Secondary | ICD-10-CM | POA: Diagnosis not present

## 2020-05-06 DIAGNOSIS — M67442 Ganglion, left hand: Secondary | ICD-10-CM | POA: Diagnosis not present

## 2020-05-09 ENCOUNTER — Ambulatory Visit (INDEPENDENT_AMBULATORY_CARE_PROVIDER_SITE_OTHER): Payer: Medicare Other

## 2020-05-09 ENCOUNTER — Telehealth: Payer: Self-pay

## 2020-05-09 ENCOUNTER — Ambulatory Visit (INDEPENDENT_AMBULATORY_CARE_PROVIDER_SITE_OTHER): Payer: Medicare Other | Admitting: Orthopaedic Surgery

## 2020-05-09 ENCOUNTER — Other Ambulatory Visit: Payer: Self-pay

## 2020-05-09 DIAGNOSIS — R2232 Localized swelling, mass and lump, left upper limb: Secondary | ICD-10-CM | POA: Diagnosis not present

## 2020-05-09 DIAGNOSIS — M67449 Ganglion, unspecified hand: Secondary | ICD-10-CM | POA: Diagnosis not present

## 2020-05-09 DIAGNOSIS — M67442 Ganglion, left hand: Secondary | ICD-10-CM | POA: Diagnosis not present

## 2020-05-09 DIAGNOSIS — I2510 Atherosclerotic heart disease of native coronary artery without angina pectoris: Secondary | ICD-10-CM

## 2020-05-09 NOTE — Telephone Encounter (Signed)
See below

## 2020-05-09 NOTE — Telephone Encounter (Signed)
Done, order corrected

## 2020-05-09 NOTE — Telephone Encounter (Signed)
Dee from [facility] called in to notify that a new order needs to be submitted for patient and include with or without contrast

## 2020-05-12 NOTE — Progress Notes (Signed)
Office Visit Note   Patient: Sean Gardner           Date of Birth: [birth year]           MRN: [id number] Visit Date: 05/09/2020              Requested by: [name], MD [address] [place],  [place] [address] PCP: [name], MD   Assessment & Plan: Visit Diagnoses:  1. Ganglion cyst of finger   2. Finger mass, left     Plan: Impression is left long finger mass.  We will obtain MRI to better characterize this lesion.  Follow-up after the MRI.  Follow-Up Instructions: Return if symptoms worsen or fail to improve.   Orders:  Orders Placed This Encounter  Procedures  . XR Finger Middle Left  . MR FINGERS LEFT W WO CONTRAST   No orders of the defined types were placed in this encounter.     Procedures: No procedures performed   Clinical Data: No additional findings.   Subjective: Chief Complaint  Patient presents with  . Hand Pain    Sean Gardner is 64 year old gentleman who is well-known to me.  He has noticed an enlarging mass on the volar side of his left long finger that is bothersome when he is grasping.  Denies any constitutional symptoms.  Denies any injuries.   Review of Systems  Constitutional: Negative.   All other systems reviewed and are negative.    Objective: Vital Signs: There were no vitals taken for this visit.  Physical Exam Vitals and nursing note reviewed.  Constitutional:      Appearance: He is well-developed.  Pulmonary:     Effort: Pulmonary effort is normal.  Abdominal:     Palpations: Abdomen is soft.  Skin:    General: Skin is warm.  Neurological:     Mental Status: He is alert and oriented to person, place, and time.  Psychiatric:        Behavior: Behavior normal.        Thought Content: Thought content normal.        Judgment: Judgment normal.     Ortho Exam Left long finger shows a palpable mass on the volar side overlying the proximal phalanx.  No overlying skin changes. Specialty Comments:  No  specialty comments available.  Imaging: No results found.   PMFS History: Patient Active Problem List   Diagnosis Date Noted  . Hyperkalemia 01/10/2020  . Bone metastasis ([place]) 09/05/2018  . Sepsis secondary to UTI ([place]) 05/11/2016  . CAD (coronary artery disease) 05/11/2016  . Hyperlipidemia 05/11/2016  . Morbid (severe) obesity due to excess calories ([place]) 03/21/2016  . Sleep apnea 09/18/2015  . Dyspnea and respiratory abnormality 09/18/2015  . Dyspnea 10/11/2012  . Essential hypertension 10/11/2012  . Lung cancer ([place]) 03/23/2012   Past Medical History:  Diagnosis Date  . Claudication in peripheral vascular disease ([place])   . Hyperlipidemia   . Hypertension   . Lung cancer ([place])    lung ca dx 2010  . Myocardial infarction ([place])   . Peripheral vascular disease ([place])   . Shortness of breath   . Sleep apnea    cpap    Family History  Problem Relation Age of Onset  . Hypertension Mother   . Lung cancer Father     Past Surgical History:  Procedure Laterality Date  . ACHILLES TENDON SURGERY    . CORONARY ARTERY BYPASS GRAFT    .  FLEXIBLE SIGMOIDOSCOPY N/A 01/18/2013   Procedure: FLEXIBLE SIGMOIDOSCOPY;  Surgeon: Arta Silence, MD;  [facility];  Service: Endoscopy;  Laterality: N/A;  . HERNIA REPAIR     inguinal  . HOT HEMOSTASIS N/A 01/18/2013   Procedure: HOT HEMOSTASIS (ARGON PLASMA COAGULATION/BICAP);  Surgeon: Arta Silence, MD;  Location: [facility];  Service: Endoscopy;  Laterality: N/A;  . LUNG SURGERY     Social History   Occupational History  . Not on file  Tobacco Use  . Smoking status: Former Smoker    Packs/day: 2.00    Years: 20.00    Pack years: 40.00    Types: Cigarettes    Quit date: 10/20/2007    Years since quitting: 12.5  . Smokeless tobacco: Never Used  Vaping Use  . Vaping Use: Never used  Substance and Sexual Activity  . Alcohol use: Not Currently  . Drug use: No  . Sexual activity: Not on file

## 2020-05-16 DIAGNOSIS — I10 Essential (primary) hypertension: Secondary | ICD-10-CM | POA: Diagnosis not present

## 2020-05-16 DIAGNOSIS — G629 Polyneuropathy, unspecified: Secondary | ICD-10-CM | POA: Diagnosis not present

## 2020-05-16 DIAGNOSIS — C3490 Malignant neoplasm of unspecified part of unspecified bronchus or lung: Secondary | ICD-10-CM | POA: Diagnosis not present

## 2020-05-16 DIAGNOSIS — E782 Mixed hyperlipidemia: Secondary | ICD-10-CM | POA: Diagnosis not present

## 2020-05-16 DIAGNOSIS — M5416 Radiculopathy, lumbar region: Secondary | ICD-10-CM | POA: Diagnosis not present

## 2020-05-16 DIAGNOSIS — G4733 Obstructive sleep apnea (adult) (pediatric): Secondary | ICD-10-CM | POA: Diagnosis not present

## 2020-05-16 DIAGNOSIS — I2510 Atherosclerotic heart disease of native coronary artery without angina pectoris: Secondary | ICD-10-CM | POA: Diagnosis not present

## 2020-05-16 DIAGNOSIS — F40240 Claustrophobia: Secondary | ICD-10-CM | POA: Diagnosis not present

## 2020-05-16 DIAGNOSIS — K219 Gastro-esophageal reflux disease without esophagitis: Secondary | ICD-10-CM | POA: Diagnosis not present

## 2020-05-16 DIAGNOSIS — J449 Chronic obstructive pulmonary disease, unspecified: Secondary | ICD-10-CM | POA: Diagnosis not present

## 2020-05-16 DIAGNOSIS — E559 Vitamin D deficiency, unspecified: Secondary | ICD-10-CM | POA: Diagnosis not present

## 2020-06-03 ENCOUNTER — Other Ambulatory Visit: Payer: Medicare Other

## 2020-06-04 ENCOUNTER — Other Ambulatory Visit: Payer: Medicare Other

## 2020-06-20 DIAGNOSIS — Z1152 Encounter for screening for COVID-19: Secondary | ICD-10-CM | POA: Diagnosis not present

## 2020-06-26 ENCOUNTER — Other Ambulatory Visit: Payer: Medicare Other

## 2020-06-27 ENCOUNTER — Ambulatory Visit (INDEPENDENT_AMBULATORY_CARE_PROVIDER_SITE_OTHER): Payer: Medicare Other | Admitting: Orthopaedic Surgery

## 2020-06-27 VITALS — Ht 72.0 in | Wt 288.0 lb

## 2020-06-27 DIAGNOSIS — I2510 Atherosclerotic heart disease of native coronary artery without angina pectoris: Secondary | ICD-10-CM

## 2020-06-27 DIAGNOSIS — M1612 Unilateral primary osteoarthritis, left hip: Secondary | ICD-10-CM

## 2020-06-27 NOTE — Progress Notes (Signed)
Office Visit Note   Patient: Sean Gardner           Date of Birth: 1956/10/18           MRN: [id number] Visit Date: 06/27/2020              Requested by: [name], MD [address],  [place] [address] PCP: [name], MD   Assessment & Plan: Visit Diagnoses:  1. Primary osteoarthritis of left hip     Plan: Cyruss has done an excellent job of weight loss.  Unfortunately with current hospital restrictions due to Covid numbers we think it is best to postpone surgery until all those restrictions are lifted.  He does have history of coronary artery disease and COPD and he is on Plavix therefore he will need preoperative cardiac risk assessment by Dr. Einar Gip.  He will continue to make efforts at weight loss.  We will draw a prealbumin level today.  Questions encouraged and answered.  We will call the patient as soon as those restrictions have been lifted so that we can proceed with surgery assuming his prealbumin is acceptable and that he has been given cardiac clearance by Dr. Einar Gip.  Follow-Up Instructions: Return if symptoms worsen or fail to improve.   Orders:  Orders Placed This Encounter  Procedures  . Prealbumin   No orders of the defined types were placed in this encounter.     Procedures: No procedures performed   Clinical Data: No additional findings.   Subjective: No chief complaint on file.   [name] comes in today for follow-up of his right hip DJD.  He he has made significant progress in weight loss and now has achieved a BMI of 39.06.   Review of Systems  Constitutional: Negative.   All other systems reviewed and are negative.    Objective: Vital Signs: Ht 6' (1.829 m)   Wt 288 lb (130.6 kg)   BMI 39.06 kg/m   Physical Exam Vitals and nursing note reviewed.  Constitutional:      Appearance: He is well-developed.  Pulmonary:     Effort: Pulmonary effort is normal.  Abdominal:     Palpations: Abdomen is soft.   Skin:    General: Skin is warm.  Neurological:     Mental Status: He is alert and oriented to person, place, and time.  Psychiatric:        Behavior: Behavior normal.        Thought Content: Thought content normal.        Judgment: Judgment normal.     Ortho Exam Right hip exam is unchanged. Specialty Comments:  No specialty comments available.  Imaging: No results found.   PMFS History: Patient Active Problem List   Diagnosis Date Noted  . Hyperkalemia 01/10/2020  . Bone metastasis ([place]) 09/05/2018  . Sepsis secondary to UTI ([place]) 05/11/2016  . CAD (coronary artery disease) 05/11/2016  . Hyperlipidemia 05/11/2016  . Morbid (severe) obesity due to excess calories ([place]) 03/21/2016  . Sleep apnea 09/18/2015  . Dyspnea and respiratory abnormality 09/18/2015  . Dyspnea 10/11/2012  . Essential hypertension 10/11/2012  . Lung cancer ([place]) 03/23/2012   Past Medical History:  Diagnosis Date  . Claudication in peripheral vascular disease ([place])   . Hyperlipidemia   . Hypertension   . Lung cancer ([place])    lung ca dx 2010  . Myocardial infarction ([place])   . Peripheral vascular disease ([place])   . Shortness of  breath   . Sleep apnea    cpap    Family History  Problem Relation Age of Onset  . Hypertension Mother   . Lung cancer Father     Past Surgical History:  Procedure Laterality Date  . ACHILLES TENDON SURGERY    . CORONARY ARTERY BYPASS GRAFT    . FLEXIBLE SIGMOIDOSCOPY N/A 01/18/2013   Procedure: FLEXIBLE SIGMOIDOSCOPY;  Surgeon: Arta Silence, MD;  [facility];  Service: Endoscopy;  Laterality: N/A;  . HERNIA REPAIR     inguinal  . HOT HEMOSTASIS N/A 01/18/2013   Procedure: HOT HEMOSTASIS (ARGON PLASMA COAGULATION/BICAP);  Surgeon: Arta Silence, MD;  Location: [facility];  Service: Endoscopy;  Laterality: N/A;  . LUNG SURGERY     Social History   Occupational History  . Not on file  Tobacco Use  . Smoking status: Former Smoker    Packs/day:  2.00    Years: 20.00    Pack years: 40.00    Types: Cigarettes    Quit date: 10/20/2007    Years since quitting: 12.6  . Smokeless tobacco: Never Used  Vaping Use  . Vaping Use: Never used  Substance and Sexual Activity  . Alcohol use: Not Currently  . Drug use: No  . Sexual activity: Not on file

## 2020-06-28 LAB — PREALBUMIN: Prealbumin: 25 mg/dL (ref 21–43)

## 2020-07-02 ENCOUNTER — Telehealth: Payer: Self-pay | Admitting: Orthopaedic Surgery

## 2020-07-02 NOTE — Telephone Encounter (Signed)
Pt called returning Dr.Xu's call in regards to a surgery and missing information? Pt would like a CB   [phone]

## 2020-07-02 NOTE — Telephone Encounter (Signed)
Pt called returning Dr.Xu's call in regards to a surgery and missing information? Pt would like a CB [phone]

## 2020-07-02 NOTE — Telephone Encounter (Signed)
Wants left [facility] as soon as covid restrictions are lifted. Thanks.

## 2020-07-02 NOTE — Telephone Encounter (Signed)
Duplicate msg.

## 2020-07-17 DIAGNOSIS — Z23 Encounter for immunization: Secondary | ICD-10-CM | POA: Diagnosis not present

## 2020-07-20 ENCOUNTER — Other Ambulatory Visit: Payer: Self-pay | Admitting: Cardiology

## 2020-07-20 DIAGNOSIS — I10 Essential (primary) hypertension: Secondary | ICD-10-CM

## 2020-08-21 ENCOUNTER — Ambulatory Visit: Payer: Medicare Other | Admitting: Cardiology

## 2020-08-22 ENCOUNTER — Other Ambulatory Visit: Payer: Self-pay

## 2020-08-22 ENCOUNTER — Encounter: Payer: Self-pay | Admitting: Cardiology

## 2020-08-22 ENCOUNTER — Ambulatory Visit: Payer: Medicare Other | Admitting: Cardiology

## 2020-08-22 VITALS — BP 127/64 | HR 58 | Resp 16 | Ht 72.0 in | Wt 288.0 lb

## 2020-08-22 DIAGNOSIS — I2510 Atherosclerotic heart disease of native coronary artery without angina pectoris: Secondary | ICD-10-CM | POA: Diagnosis not present

## 2020-08-22 DIAGNOSIS — E782 Mixed hyperlipidemia: Secondary | ICD-10-CM

## 2020-08-22 DIAGNOSIS — I10 Essential (primary) hypertension: Secondary | ICD-10-CM

## 2020-08-22 DIAGNOSIS — I739 Peripheral vascular disease, unspecified: Secondary | ICD-10-CM

## 2020-08-22 DIAGNOSIS — G4733 Obstructive sleep apnea (adult) (pediatric): Secondary | ICD-10-CM | POA: Diagnosis not present

## 2020-08-22 MED ORDER — NEXLIZET 180-10 MG PO TABS: 1.0000 | ORAL_TABLET | Freq: Every day | ORAL | 3 refills | Status: DC

## 2020-08-22 NOTE — Progress Notes (Signed)
Primary Physician/Referring:  [name], MD  Patient ID: Sean Gardner, male    DOB: December 31, 1955, 64 y.o.   MRN: [id number]  Chief Complaint  Patient presents with  . Coronary Artery Disease  . PAD  . Follow-up    6 month   HPI:    Sean Gardner is a 64 y.o. male who presents to the office for follow-up of coronary disease and hypertension and peripheral artery disease. Patient's past medical history and cardiovascular risk factors include: Established coronary disease with prior CABG in 2001, obesity, OSA on CPAP, mixed hyperlipidemia, hypertension, former smoker, history of lung cancer status post chemotherapy and left lower lobectomy. He has PAD and symptoms of claudication involving his right calf worse than the left calf. He also has DJD bilateral hips, he is now scheduled for right hip arthroplasty.  He denies worsening dyspnea, recent angina, leg edema, PND or orthopnea.  Past Medical History:  Diagnosis Date  . Claudication in peripheral vascular disease ([place])   . Hyperlipidemia   . Hypertension   . Lung cancer ([place])    lung ca dx 2010  . Myocardial infarction ([place])   . Peripheral vascular disease ([place])   . Shortness of breath   . Sleep apnea    cpap   Past Surgical History:  Procedure Laterality Date  . ACHILLES TENDON SURGERY    . CORONARY ARTERY BYPASS GRAFT    . FLEXIBLE SIGMOIDOSCOPY N/A 01/18/2013   Procedure: FLEXIBLE SIGMOIDOSCOPY;  Surgeon: Arta Silence, MD;  Location: [facility];  Service: Endoscopy;  Laterality: N/A;  . HERNIA REPAIR     inguinal  . HOT HEMOSTASIS N/A 01/18/2013   Procedure: HOT HEMOSTASIS (ARGON PLASMA COAGULATION/BICAP);  Surgeon: Arta Silence, MD;  Location: [facility];  Service: Endoscopy;  Laterality: N/A;  . LUNG SURGERY     Family History  Problem Relation Age of Onset  . Hypertension Mother   . Lung cancer Father     Social History   Tobacco Use  . Smoking status: Former Smoker    Packs/day: 2.00    Years: 20.00     Pack years: 40.00    Types: Cigarettes    Quit date: 10/19/2008    Years since quitting: 11.8  . Smokeless tobacco: Never Used  Substance Use Topics  . Alcohol use: Yes    Comment: occ   Marital Status: Married  ROS  Review of Systems  Cardiovascular: Positive for claudication. Negative for chest pain, dyspnea on exertion and leg swelling.  Musculoskeletal: Positive for arthritis (right ankle), back pain and joint pain (bilateral hips).  Gastrointestinal: Negative for melena.   Objective  Blood pressure 127/64, pulse (!) 58, resp. rate 16, height 6' (1.829 m), weight 288 lb (130.6 kg), SpO2 95 %.  Vitals with BMI 08/22/2020 06/27/2020 02/16/2020  Height _0  _1  _2   Weight 288 lbs 288 lbs 287 lbs  BMI 39.05 92.44 62.86  Systolic 381 - 771  Diastolic 64 - 70  Pulse 58 - 56     Physical Exam Vitals reviewed.  Constitutional:      Appearance: He is well-developed.     Comments: Morbidly obese  Cardiovascular:     Rate and Rhythm: Normal rate and regular rhythm.     Pulses: Intact distal pulses.          Femoral pulses are 2+ on the right side and 2+ on the left side.      Popliteal pulses are 0 on the  right side and 0 on the left side.       Dorsalis pedis pulses are 0 on the right side and 0 on the left side.       Posterior tibial pulses are 0 on the right side and 2+ on the left side.     Heart sounds: Normal heart sounds. No murmur heard.  No gallop.      Comments: No JVD. No leg edema. Pulmonary:     Effort: Pulmonary effort is normal. No accessory muscle usage or respiratory distress.     Breath sounds: Normal breath sounds.  Abdominal:     General: Bowel sounds are normal.     Palpations: Abdomen is soft.     Hernia: A hernia is present. Hernia is present in the ventral area (reducible).    Laboratory examination:   Recent Labs    01/09/20 1155 01/12/20 1145  NA 141  --   K 5.8* 5.0  CL 103  --   CO2 24  --   GLUCOSE 77  --   BUN 14  --    CREATININE 0.96  --   CALCIUM 10.6*  --   GFRNONAA 83  --   GFRAA 95  --    CrCl cannot be calculated (Patient's most recent lab result is older than the maximum 21 days allowed.).  CMP Latest Ref Rng & Units 01/12/2020 01/09/2020 09/05/2018  Glucose 65 - 99 mg/dL - 77 96  BUN 8 - 27 mg/dL - 14 16  Creatinine 0.76 - 1.27 mg/dL - 0.96 1.01  Sodium 134 - 144 mmol/L - 141 142  Potassium 3.5 - 5.2 mmol/L 5.0 5.8(H) 4.5  Chloride 96 - 106 mmol/L - 103 110  CO2 20 - 29 mmol/L - 24 25  Calcium 8.6 - 10.2 mg/dL - 10.6(H) 10.8(H)  Total Protein 6.0 - 8.5 g/dL - 6.9 7.2  Total Bilirubin 0.0 - 1.2 mg/dL - 0.6 0.7  Alkaline Phos 39 - 117 IU/L - 62 60  AST 0 - 40 IU/L - 47(H) 48(H)  ALT 0 - 44 IU/L - 66(H) 60(H)   CBC Latest Ref Rng & Units 01/09/2020 09/05/2018 08/09/2018  WBC 3.4 - 10.8 x10E3/uL 7.3 6.8 8.8  Hemoglobin 13.0 - 17.7 g/dL 15.8 16.1 15.4  Hematocrit 37.5 - 51.0 % 46.3 48.3 46.1  Platelets 150 - 450 x10E3/uL 262 236 262   Lipid Panel Recent Labs    01/09/20 1155  CHOL 146  TRIG 144  LDLCALC 85  HDL 36*  CHOLHDL 4.1   TSH No results for input(s): TSH in the last 8760 hours.  External labs:   Hemoglobin 15.800 G/ 01/09/2020 Creatinine, Serum 0.900 mg/ 02/09/2020 Potassium 5.100 mm 02/09/2020  02/09/2020: Serum glucose 8900 g, BUN 15, creatinine 0.90, EGFR >80 mL.  Potassium 5.1.  Medications and allergies   Allergies  Allergen Reactions  . Lisinopril Other (See Comments)    Hyperkalemia  . Zolpidem Tartrate Other (See Comments)    Felt fuzzy and hallucinations    Current Outpatient Medications on File Prior to Visit  Medication Sig Dispense Refill  . amLODipine (NORVASC) 5 MG tablet Take 1 tablet (5 mg total) by mouth daily. 90 tablet 3  . aspirin 81 MG tablet Take 81 mg by mouth daily.    . clopidogrel (PLAVIX) 75 MG tablet Take 1 tablet (75 mg total) by mouth daily before breakfast. 1 tablet 0  . diphenhydramine-acetaminophen (TYLENOL PM) 25-500 MG TABS  tablet Take 1 tablet by  mouth at bedtime as needed (pain).    Marland Kitchen ezetimibe (ZETIA) 10 MG tablet Take 1 tablet (10 mg total) by mouth daily. 90 tablet 3  . hydrALAZINE (APRESOLINE) 25 MG tablet TAKE 1 TABLET BY MOUTH THREE TIMES A DAY 90 tablet 3  . metoprolol (LOPRESSOR) 50 MG tablet Take 50 mg by mouth 2 (two) times daily. Hold if systolic blood pressure (top blood pressure number) less than 100 mmHg or heart rate less than 60 bpm (pulse).    . nitroGLYCERIN (NITROSTAT) 0.4 MG SL tablet Place 1 tablet (0.4 mg total) under the tongue every 5 (five) minutes as needed for chest pain.    Marland Kitchen omeprazole (PRILOSEC) 20 MG capsule Take 20 mg by mouth daily.    . rosuvastatin (CRESTOR) 40 MG tablet Take 40 mg by mouth daily.    . SYMBICORT 160-4.5 MCG/ACT inhaler Inhale 2 puffs into the lungs 2 (two) times daily.    . Vitamin D, Ergocalciferol, (DRISDOL) 50000 UNITS CAPS Take 50,000 Units by mouth every Saturday.      Current Facility-Administered Medications on File Prior to Visit  Medication Dose Route Frequency Provider Last Rate Last Admin  . methylPREDNISolone acetate (DEPO-MEDROL) injection 40 mg  40 mg Intra-articular Once Hilts, Michael, MD      . methylPREDNISolone acetate (DEPO-MEDROL) injection 40 mg  40 mg Intra-articular Once Hilts, Legrand Como, MD        Radiology:   No results found.  Cardiac Studies:   Coronary bypass grafting surgery in 2011: LIMA to the LAD, SVG to diagonal branch, free radial artery to OM branch.  Echocardiogram 11/04/2017: LVEF 58%, grade 1 diastolic impairment, moderate concentric hypertrophy, normal left atrial pressure, moderately dilated left atrium, right ventricle is mildly dilated with normal function.  Mild to moderate MR.  Stress Testing Lexiscan Myoview 12/20/2017: Gated SPECT imaging demonstrates hypokinesis of the apical anterior myocardial wall(s). The left ventricular ejection fraction was calculated or visually estimated to be 59%. SPECT images demonstrate  small, fixed perfusion abnormality of moderate intensity in the apical anterior myocardial wall(s). SPECT images also demonstrate small, reversible perfusion abnormality of mild intensity in mid inferior, basal inferior myocardium. This suggests mild inferior ischemia, and possible anteroapical infarct. Low to intermediate risk study.  Lower extremity arterial duplex 11/05/2019:  Abnormal ABIs bilaterally suggestive of peripheral vascular disease.   Moderately abnormal pulse volume recordings at the level of bilateral ankles. Arterial duplex (right) demonstrates multiphasic waveform up to the popliteal artery.  Posterior tibial artery and anterior tibial artery noted to have monophasic waveform. Arterial duplex (left) demonstrates multiphasic waveform except at the level of mid SFA.  Mid SFA demonstrates a monophasic waveform with near occlusion and diffuse plaque. Conclusions: This exam reveals moderately decreased perfusion of the right lower extremity, noted at the post tibial artery level (ABI 0.67) and moderately decreased perfusion of the left lower extremity, noted at the post tibial artery level (ABI 0.73).  EKG  EKG 08/22/2020: Normal sinus rhythm at rate of 56 bpm, normal axis, right bundle branch block.  Inferior and lateral ST-T wave abnormality, consider LVH with repolarization, cannot exclude subendocardial infarct.   No significant change from 10/31/2019   Assessment     ICD-10-CM   1. Atherosclerosis of native coronary artery of native heart without angina pectoris  I25.10 EKG 12-Lead  2. Claudication in peripheral vascular disease (HCC)  I73.9   3. Essential hypertension  I10   4. OSA (obstructive sleep apnea)  G47.33   5. Mixed hyperlipidemia  E78.2     No orders of the defined types were placed in this encounter.   Medications Discontinued During This Encounter  Medication Reason  . clindamycin (CLEOCIN) 150 MG capsule Completed Course    Recommendations:   Sean Gardner is a 64 y.o. Sean Gardner is a 64 y.o. male who presents to the office for follow-up of coronary disease and hypertension and peripheral artery disease. Patient's past medical history and cardiovascular risk factors include: Established coronary disease with prior CABG in 2001, obesity, OSA on CPAP, mixed hyperlipidemia, hypertension, former smoker, history of lung cancer status post chemotherapy and left lower lobectomy. He has PAD and symptoms of claudication involving his right calf worse than the left calf. He also has DJD bilateral hips, he is now scheduled for right hip arthroplasty.  He is presently on dual antiplatelet therapy for claudication, Plavix can be held 5 days prior to surgery, however he is advised to continue aspirin.  With regard to PAD and symptoms of claudication, advised him that once hip replacement is done, if still having issues with claudication, we could consider angiography at that time.  With regard to hyperlipidemia, LDL is still not at goal and in view of PAD and CAD, I would like to stop his Zetia and switch him to Nexlizet if it is approved by his insurance, I have given him a printed prescription for him to take it to the [place].  His blood pressure is well controlled on present medical regimen, he could not tolerate lisinopril due to hyperkalemia.  I will see him back in 6 months for follow-up.   Adrian Prows, MD, [facility] 08/22/2020, 12:53 PM Office: [phone] Pager: [phone]

## 2020-08-23 ENCOUNTER — Other Ambulatory Visit: Payer: Self-pay

## 2020-09-03 ENCOUNTER — Other Ambulatory Visit: Payer: Self-pay

## 2020-09-03 DIAGNOSIS — E782 Mixed hyperlipidemia: Secondary | ICD-10-CM

## 2020-09-03 DIAGNOSIS — I2510 Atherosclerotic heart disease of native coronary artery without angina pectoris: Secondary | ICD-10-CM

## 2020-09-03 MED ORDER — NEXLIZET 180-10 MG PO TABS: 1.0000 | ORAL_TABLET | Freq: Every day | ORAL | 3 refills | Status: DC

## 2020-09-04 NOTE — Pre-Procedure Instructions (Signed)
Sean Gardner  09/04/2020      [id number] - [place], [place] - [name] [place] [address] Phone: [phone] Fax: [phone]    Your procedure is scheduled on Nov. 29  Report to [facility] Entrance A at 5:30 A.M.  Call this number if you have problems the morning of surgery:  [phone]   Remember:  Do not eat after midnight.  You may drink clear liquids until 4:30 AM .  Clear liquids allowed are:                    Water, Juice (non-citric and without pulp - diabetics please choose diet or no sugar options), Carbonated beverages - (diabetics please choose diet or no sugar options), Clear Tea, Black Coffee only (no creamer, milk or cream including half and half), Plain Jell-O only (diabetics please choose diet or no sugar options), Gatorade (diabetics please choose diet or no sugar options) and Plain Popsicles only                    Enhanced Recovery after Surgery for Orthopedics Enhanced Recovery after Surgery is a protocol used to improve the stress on your body and your recovery after surgery.  Patient Instructions  . The night before surgery:  o No food after midnight. ONLY clear liquids after midnight  .  Marland Kitchen The day of surgery (if you do NOT have diabetes):  o Drink ONE (1) Pre-Surgery Clear Ensure by 4:30 am the morning of surgery   o This drink was given to you during your hospital  pre-op appointment visit. o Nothing else to drink after completing the  Pre-Surgery Clear Ensure.          If you have questions, please contact your surgeon's office.     Take these medicines the morning of surgery with A SIP OF WATER :               Amlodipine (norvasc)              bempedoic acid-ezetimibe (nexlizet)              Ezetimibe (zetia)              Hydralazine (apresoline)              Metoprolol (lopressor)              ompeprazole (prilosec)              Rosuvastatin (crestor)               symbicort --bring to hospital                  7 days prior to surgery STOP taking any Aspirin (unless otherwise instructed by your surgeon), Aleve, Naproxen, Ibuprofen, Motrin, Advil, Goody's, BC's, all herbal medications, fish oil, and all vitamins.                    Follow your surgeon's instructions on when to stop Aspirin and plavix.  If no instructions were given by your surgeon then you will need to call the office to get those instructions.                    Do not wear jewelry.  Do not wear lotions, powders, or perfumes, or deodorant.  Do not shave 48  hours prior to surgery.  Men may shave face and neck.  Do not bring valuables to the hospital.  [facility] is not responsible for any belongings or valuables.  Contacts, dentures or bridgework may not be worn into surgery.  Leave your suitcase in the car.  After surgery it may be brought to your room.  For patients admitted to the hospital, discharge time will be determined by your treatment team.  Patients discharged the day of surgery will not be allowed to drive home.    Special instructions:   [facility]- Preparing For Surgery  Before surgery, you can play an important role. Because skin is not sterile, your skin needs to be as free of germs as possible. You can reduce the number of germs on your skin by washing with CHG (chlorahexidine gluconate) Soap before surgery.  CHG is an antiseptic cleaner which kills germs and bonds with the skin to continue killing germs even after washing.    Oral Hygiene is also important to reduce your risk of infection.  Remember - BRUSH YOUR TEETH THE MORNING OF SURGERY WITH YOUR REGULAR TOOTHPASTE  Please do not use if you have an allergy to CHG or antibacterial soaps. If your skin becomes reddened/irritated stop using the CHG.  Do not shave (including legs and underarms) for at least 48 hours prior to first CHG shower. It is OK to shave your face.  Please follow these instructions  carefully.   1. Shower the NIGHT BEFORE SURGERY and the MORNING OF SURGERY with CHG.   2. If you chose to wash your hair, wash your hair first as usual with your normal shampoo.  3. After you shampoo, rinse your hair and body thoroughly to remove the shampoo.  4. Use CHG as you would any other liquid soap. You can apply CHG directly to the skin and wash gently with a scrungie or a clean washcloth.   5. Apply the CHG Soap to your body ONLY FROM THE NECK DOWN.  Do not use on open wounds or open sores. Avoid contact with your eyes, ears, mouth and genitals (private parts). Wash Face and genitals (private parts)  with your normal soap.  6. Wash thoroughly, paying special attention to the area where your surgery will be performed.  7. Thoroughly rinse your body with warm water from the neck down.  8. DO NOT shower/wash with your normal soap after using and rinsing off the CHG Soap.  9. Pat yourself dry with a CLEAN TOWEL.  10. Wear CLEAN PAJAMAS to bed the night before surgery, wear comfortable clothes the morning of surgery  11. Place CLEAN SHEETS on your bed the night of your first shower and DO NOT SLEEP WITH PETS.    Day of Surgery:  Do not apply any deodorants/lotions.  Please wear clean clothes to the hospital/surgery center.   Remember to brush your teeth WITH YOUR REGULAR TOOTHPASTE.    Please read over the following fact sheets that you were given.

## 2020-09-05 ENCOUNTER — Other Ambulatory Visit: Payer: Self-pay

## 2020-09-05 ENCOUNTER — Encounter (HOSPITAL_COMMUNITY)
Admission: RE | Admit: 2020-09-05 | Discharge: 2020-09-05 | Disposition: A | Discharge status: Home / Self Care | Payer: Medicare Other | Source: Ambulatory Visit | Attending: Orthopaedic Surgery | Admitting: Orthopaedic Surgery

## 2020-09-05 ENCOUNTER — Ambulatory Visit (HOSPITAL_COMMUNITY)
Admission: RE | Admit: 2020-09-05 | Discharge: 2020-09-05 | Disposition: A | Discharge status: Home / Self Care | Payer: Medicare Other | Source: Ambulatory Visit | Attending: Physician Assistant | Admitting: Physician Assistant

## 2020-09-05 ENCOUNTER — Encounter (HOSPITAL_COMMUNITY): Payer: Self-pay

## 2020-09-05 DIAGNOSIS — G4733 Obstructive sleep apnea (adult) (pediatric): Secondary | ICD-10-CM | POA: Insufficient documentation

## 2020-09-05 DIAGNOSIS — E785 Hyperlipidemia, unspecified: Secondary | ICD-10-CM | POA: Insufficient documentation

## 2020-09-05 DIAGNOSIS — Z85118 Personal history of other malignant neoplasm of bronchus and lung: Secondary | ICD-10-CM | POA: Insufficient documentation

## 2020-09-05 DIAGNOSIS — Z7902 Long term (current) use of antithrombotics/antiplatelets: Secondary | ICD-10-CM | POA: Insufficient documentation

## 2020-09-05 DIAGNOSIS — Z951 Presence of aortocoronary bypass graft: Secondary | ICD-10-CM | POA: Diagnosis not present

## 2020-09-05 DIAGNOSIS — Z7982 Long term (current) use of aspirin: Secondary | ICD-10-CM | POA: Insufficient documentation

## 2020-09-05 DIAGNOSIS — Z01818 Encounter for other preprocedural examination: Secondary | ICD-10-CM | POA: Diagnosis not present

## 2020-09-05 DIAGNOSIS — M1611 Unilateral primary osteoarthritis, right hip: Secondary | ICD-10-CM

## 2020-09-05 DIAGNOSIS — I739 Peripheral vascular disease, unspecified: Secondary | ICD-10-CM | POA: Diagnosis not present

## 2020-09-05 DIAGNOSIS — Z79899 Other long term (current) drug therapy: Secondary | ICD-10-CM | POA: Insufficient documentation

## 2020-09-05 DIAGNOSIS — Z9221 Personal history of antineoplastic chemotherapy: Secondary | ICD-10-CM | POA: Insufficient documentation

## 2020-09-05 DIAGNOSIS — Z87891 Personal history of nicotine dependence: Secondary | ICD-10-CM | POA: Diagnosis not present

## 2020-09-05 DIAGNOSIS — I252 Old myocardial infarction: Secondary | ICD-10-CM | POA: Diagnosis not present

## 2020-09-05 DIAGNOSIS — I2510 Atherosclerotic heart disease of native coronary artery without angina pectoris: Secondary | ICD-10-CM | POA: Insufficient documentation

## 2020-09-05 DIAGNOSIS — I10 Essential (primary) hypertension: Secondary | ICD-10-CM | POA: Insufficient documentation

## 2020-09-05 DIAGNOSIS — Z96641 Presence of right artificial hip joint: Secondary | ICD-10-CM | POA: Diagnosis not present

## 2020-09-05 DIAGNOSIS — J929 Pleural plaque without asbestos: Secondary | ICD-10-CM | POA: Diagnosis not present

## 2020-09-05 LAB — COMPREHENSIVE METABOLIC PANEL
ALT: 29 U/L (ref 0–44)
AST: 29 U/L (ref 15–41)
Albumin: 4 g/dL (ref 3.5–5.0)
Alkaline Phosphatase: 46 U/L (ref 38–126)
Anion gap: 9 (ref 5–15)
BUN: 15 mg/dL (ref 8–23)
CO2: 25 mmol/L (ref 22–32)
Calcium: 9.7 mg/dL (ref 8.9–10.3)
Chloride: 107 mmol/L (ref 98–111)
Creatinine, Ser: 0.92 mg/dL (ref 0.61–1.24)
GFR, Estimated: >60 mL/min (ref >60)
Glucose, Bld: 93 mg/dL (ref 70–99)
Potassium: 4.6 mmol/L (ref 3.5–5.1)
Sodium: 141 mmol/L (ref 135–145)
Total Bilirubin: 0.5 mg/dL (ref 0.3–1.2)
Total Protein: 6.6 g/dL (ref 6.5–8.1)

## 2020-09-05 LAB — CBC WITH DIFFERENTIAL/PLATELET
Abs Immature Granulocytes: 0.04 10*3/uL (ref 0.00–0.07)
Basophils Absolute: 0.1 10*3/uL (ref 0.0–0.1)
Basophils Relative: 1 %
Eosinophils Absolute: 0.1 10*3/uL (ref 0.0–0.5)
Eosinophils Relative: 2 %
HCT: 45.3 % (ref 39.0–52.0)
Hemoglobin: 14.6 g/dL (ref 13.0–17.0)
Immature Granulocytes: 1 %
Lymphocytes Relative: 19 %
Lymphs Abs: 1.5 10*3/uL (ref 0.7–4.0)
MCH: 32.2 pg (ref 26.0–34.0)
MCHC: 32.2 g/dL (ref 30.0–36.0)
MCV: 100 fL (ref 80.0–100.0)
Monocytes Absolute: 0.6 10*3/uL (ref 0.1–1.0)
Monocytes Relative: 7 %
Neutro Abs: 5.8 10*3/uL (ref 1.7–7.7)
Neutrophils Relative %: 70 %
Platelets: 265 10*3/uL (ref 150–400)
RBC: 4.53 MIL/uL (ref 4.22–5.81)
RDW: 14.1 % (ref 11.5–15.5)
WBC: 8.2 10*3/uL (ref 4.0–10.5)
nRBC: 0 % (ref 0.0–0.2)

## 2020-09-05 LAB — URINALYSIS, ROUTINE W REFLEX MICROSCOPIC
Bacteria, UA: NONE SEEN
Bilirubin Urine: NEGATIVE
Glucose, UA: NEGATIVE mg/dL
Hgb urine dipstick: NEGATIVE
Ketones, ur: NEGATIVE mg/dL
Leukocytes,Ua: NEGATIVE
Nitrite: NEGATIVE
Protein, ur: 30 mg/dL — AB
Specific Gravity, Urine: 1.03 (ref 1.005–1.030)
pH: 5 (ref 5.0–8.0)

## 2020-09-05 LAB — SURGICAL PCR SCREEN
MRSA, PCR: NEGATIVE
Staphylococcus aureus: NEGATIVE

## 2020-09-05 LAB — PROTIME-INR
INR: 1.1 (ref 0.8–1.2)
Prothrombin Time: 13.4 seconds (ref 11.4–15.2)

## 2020-09-05 LAB — APTT: aPTT: 29 seconds (ref 24–36)

## 2020-09-05 NOTE — Progress Notes (Signed)
PCP - [name] @ [facility]   Chest x-ray - 09/05/20 EKG - 08/22/20 Stress Test - 12/20/17 in [facility]'s notes ECHO - 11/04/17 Einar Gip notes) Cardiac Cath - 2010  Sleep Study -  Yrs. ago CPAP - yes    Blood Thinner Instructions: plavix--[facility] note to hold 5 days prior to surgery and continue aspirin. Pt. Reports no one has instructed him.  Aspirin Instructions: Pt. To call Dr. Erlinda Hong for clarification  ERAS Protcol -yes PRE-SURGERY Ensure given  COVID TEST- 09/13/20   Anesthesia review: cardiac hx. Follow up on plavix   Patient denies shortness of breath, fever, cough and chest pain at PAT appointment   All instructions explained to the patient, with a verbal understanding of the material. Patient agrees to go over the instructions while at home for a better understanding. Patient also instructed to self quarantine after being tested for COVID-19. The opportunity to ask questions was provided.

## 2020-09-06 ENCOUNTER — Telehealth: Payer: Self-pay

## 2020-09-06 ENCOUNTER — Encounter (HOSPITAL_COMMUNITY): Payer: Self-pay

## 2020-09-06 LAB — TYPE AND SCREEN
ABO/RH(D): O POS
Antibody Screen: NEGATIVE

## 2020-09-06 NOTE — Telephone Encounter (Signed)
Patient aware.

## 2020-09-06 NOTE — Telephone Encounter (Signed)
Patient called in saying he went to [facility] yesterday and nurse asked him when is he supposed to stop taking his Asprin & other medications prior to his upcoming surgery.  Nurse advised him to speak with xu so he can know when to stop taking it. Patients also wanted to ask questions regarding getting his walker.

## 2020-09-06 NOTE — Telephone Encounter (Signed)
RE: Plavix, ASA Received: Laren Boom, PA-C  Alric Quan, [place], great!   Kathlee Nations, could you call this patient and have him stop the Plavix 7 days pre-op and also tell him it is ok to continue the baby aspirin once daily until surgery?   Mendel Ryder       Previous Messages   ----- Message -----  From: Jacinta Shoe, PA-C  Sent: 09/05/2020 11:46 AM EST  To: Aundra Dubin, PA-C  Subject: RE: Plavix, ASA                  ASA is okay to continue for spinal anesthesia.   Ebony Hail  ----- Message -----  From: Nathaniel Man  Sent: 09/05/2020 11:24 AM EST  To: Jacinta Shoe, PA-C  Subject: RE: Plavix, ASA                  Thanks for reaching out. I spoke to Dr. Erlinda Hong and he is ok if the patient stops the plavix 7 days prior. We are ok if he continues a baby aspirin daily, as long as it is ok to proceed with spinal anesthesia? What are your thoughts about this?    ----- Message -----  From: Rutherford Limerick  Sent: 09/05/2020 10:29 AM EST  To: Adrian Prows, MD, Salvatore Decent, *  Subject: Plavix, ASA                    Dr. Rosine Door, (CC: Dr. Einar Gip, [facility])  Sean Gardner had his pre-surgical testing visit this morning. He said he was not given instructions on ASA and Plavix for his right total hip arthroplasty on 09/16/20.--although recent note by Dr. Einar Gip states, "Plavix can be held 5 days prior to surgery, however he is advised to continue aspirin."   However, case is posted for spinal anesthesia, and our anesthesiologists request holding clopidogrel for 7 days for spinals. Your staff will need to clarify if this is okay with Dr. Einar Gip, but I will go ahead and forward this message to see if he's available to respond by staff message.   [name] was asked to follow-up with your office.   Thanks,  Myra Gianotti, PA-C  Surgical Short  Stay/Anesthesiology  [facility] Phone [phone]  09/05/2020 10:51 AM

## 2020-09-06 NOTE — Telephone Encounter (Signed)
Per Dr. Erlinda Hong - Stop Plavix 5 days prior to surgery.

## 2020-09-06 NOTE — Anesthesia Preprocedure Evaluation (Addendum)
Anesthesia Evaluation  Patient identified by MRN, date of birth, ID band Patient awake    Reviewed: Allergy & Precautions, NPO status , Patient's Chart, lab work & pertinent test results  Airway Mallampati: III  TM Distance: >3 FB Neck ROM: Full    Dental no notable dental hx. (+) Teeth Intact, Dental Advisory Given   Pulmonary shortness of breath, sleep apnea , former smoker,    Pulmonary exam normal breath sounds clear to auscultation       Cardiovascular Exercise Tolerance: Good hypertension, Pt. on medications + CAD, + Past MI (MI 2001) and + Peripheral Vascular Disease  Normal cardiovascular exam Rhythm:Regular Rate:Normal  Cards low risk   Neuro/Psych    GI/Hepatic negative GI ROS, Neg liver ROS,   Endo/Other  negative endocrine ROS  Renal/GU negative Renal ROSK+ 4.6 Cr 0.92      Musculoskeletal negative musculoskeletal ROS (+)   Abdominal (+) + obese,   Peds  Hematology Hgb 14.5 Plt 265   Anesthesia Other Findings   Reproductive/Obstetrics                           Anesthesia Physical Anesthesia Plan  ASA: III  Anesthesia Plan: Spinal   Post-op Pain Management:    Induction:   PONV Risk Score and Plan: 2 and Treatment may vary due to age or medical condition, Ondansetron and Dexamethasone  Airway Management Planned: Nasal Cannula and Natural Airway  Additional Equipment: None  Intra-op Plan:   Post-operative Plan:   Informed Consent: I have reviewed the patients History and Physical, chart, labs and discussed the procedure including the risks, benefits and alternatives for the proposed anesthesia with the patient or authorized representative who has indicated his/her understanding and acceptance.     Dental advisory given  Plan Discussed with: CRNA and Anesthesiologist  Anesthesia Plan Comments: (PAT note written 09/06/2020 by Myra Gianotti, PA-C.  Spinal )       Anesthesia Quick Evaluation

## 2020-09-06 NOTE — Progress Notes (Signed)
Anesthesia Chart Review:  Case: [id number] Date/Time: 09/16/20 0700   Procedure: RIGHT TOTAL HIP ARTHROPLASTY ANTERIOR APPROACH (Right Hip)   Anesthesia type: Spinal   Pre-op diagnosis: right hip degenerative joint disease   Location: [facility] ROOM 04 / [facility]   Surgeons: Leandrew Koyanagi, MD      DISCUSSION: Patient is a 64 year old male scheduled for the above procedure.  History includes former smoker (quit 10/19/08), CAD (MI 2001, s/p CABG 08/18/00: LIMA-dLAD, RIMA-dRCA, left RA-OM2, SVG-D2; NSTEMI 08/13/10 with 99% SVG-D2, s/p unsuccessful thrombectomy, medical management), HTN, PVD/claudication (s/p left SFA stent 01/24/04), HLD, exertional dyspnea, neuroendocrine lung carcinoma (s/p LL lobectomy with LN dissection 01/29/09, 1+ LN, s/p chemotherapy), OSA (CPAP). BMI is consistent with obesity.   On 04/01/20, cardiologist Dr. Einar Gip wrote, "Sean Gardner is at low risk, from a cardiac standpoint,  For his upcoming procedure left total hip arthroplasty.  It is ok to proceed without further cardiac testing. Hold plavix for 5 days and restart 24-48 hours later if stable but can hold additional days of 2-5 days if needed." Surgery was postponed due to [place] restrictions and increased hospitalization, but he was last evaluated by Dr. Einar Gip on 08/22/20. He noted patient of DAPT for claudication and again recommended holding Plavix for 5 days prior to surgery and continue ASA. He will consider peripheral angiography after surgery if he continues to have claudication symptoms.   I did communicate with Dr. Erlinda Hong and Aundra Dubin, PA-C to follow-up with cardiology regarding holding Plavix for 7 days since spinal anesthesia is being considered and to communicate desired instructions to patient.   Preoperative COVID-19 test is scheduled for 09/13/20. Anesthesia team to evaluate on the day of surgery.    VS: BP (!) 139/51   Pulse (!) 57   Temp 36.6 C (Oral)   Resp 19   Ht 6' (1.829 m)   Wt 129.7 kg   SpO2 97%    BMI 38.79 kg/m     PROVIDERS: [name], MD is PCP  Adrian Prows, MD is cardiologist Curt Bears, MD is HEM-ONC. Last visit in 2019.  Christinia Gully, MD is pulmonologist. Last visit 03/21/16.   LABS: Labs reviewed: Acceptable for surgery. (all labs ordered are listed, but only abnormal results are displayed)  Labs Reviewed  URINALYSIS, ROUTINE W REFLEX MICROSCOPIC - Abnormal; Notable for the following components:      Result Value   Color, Urine AMBER (*)    Protein, ur 30 (*)    All other components within normal limits  SURGICAL PCR SCREEN  APTT  CBC WITH DIFFERENTIAL/PLATELET  COMPREHENSIVE METABOLIC PANEL  PROTIME-INR  TYPE AND SCREEN     IMAGES: CXR 09/05/20: FINDINGS: - Cardiac silhouette top-normal in size. Stable changes from prior CABG surgery. No mediastinal or hilar masses. No evidence of adenopathy. - Chronic pleural thickening at the left lung base. - Lungs mildly hyperexpanded, but clear. - No pleural effusion or pneumothorax. - Skeletal structures are intact. IMPRESSION: No active cardiopulmonary disease.   EKG: EKG 08/22/2020 [facility]): Normal sinus rhythm at rate of 56 bpm, normal axis, right bundle branch block.  Inferior and lateral ST-T wave abnormality, consider LVH with repolarization, cannot exclude subendocardial infarct.     CV: As outlined in 08/22/20 note by Dr. Einar Gip: Echocardiogram 11/04/2017: LVEF 04%, grade 1 diastolic impairment, moderate concentric hypertrophy, normal left atrial pressure, moderately dilated left atrium, right ventricle is mildly dilated with normal function.  Mild to moderate MR.  Stress Testing  Lexiscan Myoview 12/20/2017: Gated SPECT imaging demonstrates hypokinesis of the apical anterior myocardial wall(s). The left ventricular ejection fraction was calculated or visually estimated to be 59%. SPECT images demonstrate small, fixed perfusion abnormality of moderate intensity in the apical anterior  myocardial wall(s). SPECT images also demonstrate small, reversible perfusion abnormality of mild intensity in mid inferior, basal inferior myocardium. This suggests mild inferior ischemia, and possible anteroapical infarct. Low to intermediate risk study.  Lower extremity arterial duplex 11/05/2019:  Abnormal ABIs bilaterally suggestive of peripheral vascular disease.   Moderately abnormal pulse volume recordings at the level of bilateral ankles. Arterial duplex (right) demonstrates multiphasic waveform up to the popliteal artery.  Posterior tibial artery and anterior tibial artery noted to have monophasic waveform. Arterial duplex (left) demonstrates multiphasic waveform except at the level of mid SFA.  Mid SFA demonstrates a monophasic waveform with near occlusion and diffuse plaque. Conclusions: This exam reveals moderately decreased perfusion of the right lower extremity, noted at the post tibial artery level (ABI 0.67) and moderately decreased perfusion of the left lower extremity, noted at the post tibial artery level (ABI 0.73).   Past Medical History:  Diagnosis Date  . Claudication in peripheral vascular disease ([place])   . Hyperlipidemia   . Hypertension   . Lung cancer ([place])    neuroendocrine lung ca dx 2010  . Myocardial infarction ([place]) 07/2000  . Peripheral vascular disease ([place])   . Shortness of breath    on exertion,can't lay on his back  . Sleep apnea    cpap    Past Surgical History:  Procedure Laterality Date  . ACHILLES TENDON SURGERY    . CORONARY ARTERY BYPASS GRAFT  08/18/2000   LIMA--dLIMA, RIMA-dRCA, left RA-OM2, SVG-D2  . FLEXIBLE SIGMOIDOSCOPY N/A 01/18/2013   Procedure: FLEXIBLE SIGMOIDOSCOPY;  Surgeon: Arta Silence, MD;  Location: [facility];  Service: Endoscopy;  Laterality: N/A;  . HERNIA REPAIR     inguinal  . HOT HEMOSTASIS N/A 01/18/2013   Procedure: HOT HEMOSTASIS (ARGON PLASMA COAGULATION/BICAP);  Surgeon: Arta Silence, MD;  Location: [facility];  Service: Endoscopy;  Laterality: N/A;  . LUNG SURGERY Left 01/29/2009   left lower lobectomy     MEDICATIONS: . amLODipine (NORVASC) 5 MG tablet  . aspirin 81 MG tablet  . Bempedoic Acid-Ezetimibe (NEXLIZET) 180-10 MG TABS  . clopidogrel (PLAVIX) 75 MG tablet  . diphenhydramine-acetaminophen (TYLENOL PM) 25-500 MG TABS tablet  . ezetimibe (ZETIA) 10 MG tablet  . hydrALAZINE (APRESOLINE) 25 MG tablet  . metoprolol (LOPRESSOR) 50 MG tablet  . omeprazole (PRILOSEC) 20 MG capsule  . rosuvastatin (CRESTOR) 40 MG tablet  . SYMBICORT 160-4.5 MCG/ACT inhaler  . Vitamin D, Ergocalciferol, (DRISDOL) 50000 UNITS CAPS   No current facility-administered medications for this encounter.    Myra Gianotti, PA-C Surgical Short Stay/Anesthesiology [facility] Phone [phone] [facility] Phone [phone] 09/06/2020 3:35 PM

## 2020-09-09 ENCOUNTER — Telehealth: Payer: Self-pay | Admitting: Pharmacist

## 2020-09-09 ENCOUNTER — Telehealth: Payer: Self-pay | Admitting: Orthopaedic Surgery

## 2020-09-09 DIAGNOSIS — I2510 Atherosclerotic heart disease of native coronary artery without angina pectoris: Secondary | ICD-10-CM

## 2020-09-09 DIAGNOSIS — E782 Mixed hyperlipidemia: Secondary | ICD-10-CM

## 2020-09-09 DIAGNOSIS — I739 Peripheral vascular disease, unspecified: Secondary | ICD-10-CM

## 2020-09-09 MED ORDER — REPATHA SURECLICK 140 MG/ML ~~LOC~~ SOAJ: 140.0000 mg | SUBCUTANEOUS | 2 refills | Status: DC

## 2020-09-09 NOTE — Telephone Encounter (Signed)
Pt called stating that he tried picking up Nexlizet through [facility], but it wasn't on [place]. Tried filling it through his local pharmacy, but was quoted co-pay of ~$100/month. Pt stated he would be unwilling to continue switching Zetia to Nexlizet if the co-pay remains ~$100/month. Not eligible for co-pay card since pt has [employer] and [employer]. Per Dr. Einar Gip, pt willing to start Repatha 140 mg if it is covered by his insurance for LDL >70 mg/dL. Rx pended to Dr. Einar Gip. Will follow up PA request if needed.

## 2020-09-09 NOTE — Telephone Encounter (Signed)
Atherosclerosis of native coronary artery of native heart without angina pectoris - Plan: Evolocumab (REPATHA SURECLICK) 295 MG/ML SOAJ  Mixed hyperlipidemia - Plan: Evolocumab (REPATHA SURECLICK) 747 MG/ML SOAJ  Claudication in peripheral vascular disease ([place]) - Plan: Evolocumab (REPATHA SURECLICK) 340 MG/ML SOAJ   Medications Discontinued During This Encounter  Medication Reason  . Bempedoic Acid-Ezetimibe (NEXLIZET) 180-10 MG TABS Cost of medication  . Bempedoic Acid-Ezetimibe (NEXLIZET) 180-10 MG TABS Cost of medication        Meds ordered this encounter  Medications  . Evolocumab (REPATHA SURECLICK) 370 MG/ML SOAJ   Sig: Inject 140 mg into the skin every 14 (fourteen) days.   Dispense:  6 mL   Refill:  2                .                   Adrian Prows, MD, [facility] 09/09/2020, 6:11 PM Office: [phone] Pager: [phone]

## 2020-09-09 NOTE — Telephone Encounter (Signed)
Patient called requesting a call back. Patient states no one has contacted him about supplier needed after his upcoming surgery. Patient states he would like to know if he will receive a bedside commode, shower bench ect. Please call patient about this matter at [phone].

## 2020-09-10 ENCOUNTER — Other Ambulatory Visit: Payer: Self-pay | Admitting: Physician Assistant

## 2020-09-10 ENCOUNTER — Telehealth: Payer: Self-pay | Admitting: *Deleted

## 2020-09-10 MED ORDER — OXYCODONE-ACETAMINOPHEN 5-325 MG PO TABS
1.0000 | ORAL_TABLET | Freq: Four times a day (QID) | ORAL | 0 refills | Status: DC | PRN
Start: 2020-09-10 — End: 2020-11-18

## 2020-09-10 MED ORDER — ASPIRIN EC 325 MG PO TBEC: 325.0000 mg | DELAYED_RELEASE_TABLET | Freq: Every day | ORAL | 0 refills | Status: DC

## 2020-09-10 MED ORDER — DOCUSATE SODIUM 100 MG PO CAPS: 100.0000 mg | ORAL_CAPSULE | Freq: Every day | ORAL | 2 refills | Status: DC | PRN

## 2020-09-10 MED ORDER — METHOCARBAMOL 500 MG PO TABS: 500.0000 mg | ORAL_TABLET | Freq: Two times a day (BID) | ORAL | 0 refills | Status: DC | PRN

## 2020-09-10 MED ORDER — ONDANSETRON HCL 4 MG PO TABS: 4.0000 mg | ORAL_TABLET | Freq: Three times a day (TID) | ORAL | 0 refills | Status: DC | PRN

## 2020-09-10 NOTE — Care Plan (Signed)
RNCM call to patient to discuss his upcoming Right total hip arthroplasty on 09/16/20 with Dr. Erlinda Hong. Patient is an Ortho bundle patient through [facility] and is agreeable to case management. He has a wife that will be able to assist him after discharge. He needs a FWW and BSC/3in1 for home use. CM will order through [employer] to be delivered to hospital room prior to discharge. Anticipate HHPT to be needed after short hospital stay. Choice provided and referral made to [facility]. Reviewed all post-op care instructions and allowed patient to ask questions. Will continue to follow for needs.

## 2020-09-10 NOTE — Telephone Encounter (Signed)
Ortho bundle Pre-op call completed. 

## 2020-09-10 NOTE — Telephone Encounter (Signed)
See below. Can you help with this?

## 2020-09-10 NOTE — Telephone Encounter (Signed)
IC patient. He was under the impression that you were helping with this? I was unsure because typically this arranged at discharge? Can you please help with this? Patient said he is scheduled for surgery Monday.

## 2020-09-10 NOTE — Telephone Encounter (Signed)
I will handle. He is an Ortho bundle and he is anxious about getting his stuff. He is last on my list to call, but I will contact him today to ease his mind. Sorry. Thanks.

## 2020-09-13 ENCOUNTER — Other Ambulatory Visit (HOSPITAL_COMMUNITY)
Admission: RE | Admit: 2020-09-13 | Discharge: 2020-09-13 | Disposition: A | Discharge status: Home / Self Care | Payer: Medicare Other | Source: Ambulatory Visit | Attending: Orthopaedic Surgery | Admitting: Orthopaedic Surgery

## 2020-09-13 DIAGNOSIS — Z01812 Encounter for preprocedural laboratory examination: Secondary | ICD-10-CM | POA: Diagnosis not present

## 2020-09-13 DIAGNOSIS — Z20822 Contact with and (suspected) exposure to covid-19: Secondary | ICD-10-CM | POA: Diagnosis not present

## 2020-09-13 LAB — SARS CORONAVIRUS 2 (TAT 6-24 HRS): SARS Coronavirus 2: NEGATIVE

## 2020-09-13 MED ORDER — BUPIVACAINE LIPOSOME 1.3 % IJ SUSP
20.0000 mL | Freq: Once | INTRAMUSCULAR | Status: DC
  Filled 2020-09-13: qty 20

## 2020-09-13 MED ORDER — DEXTROSE 5 % IV SOLN
3.0000 g | INTRAVENOUS | Status: AC
  Administered 2020-09-16: 3 g via INTRAVENOUS
  Filled 2020-09-13: qty 3

## 2020-09-13 MED ORDER — TRANEXAMIC ACID 1000 MG/10ML IV SOLN
2000.0000 mg | INTRAVENOUS | Status: DC
  Filled 2020-09-13: qty 20

## 2020-09-16 ENCOUNTER — Observation Stay (HOSPITAL_COMMUNITY): Payer: Medicare Other

## 2020-09-16 ENCOUNTER — Other Ambulatory Visit: Payer: Self-pay

## 2020-09-16 ENCOUNTER — Encounter (HOSPITAL_COMMUNITY): Payer: Self-pay | Admitting: Orthopaedic Surgery

## 2020-09-16 ENCOUNTER — Ambulatory Visit (HOSPITAL_COMMUNITY): Payer: Medicare Other | Admitting: Anesthesiology

## 2020-09-16 ENCOUNTER — Ambulatory Visit (HOSPITAL_COMMUNITY): Payer: Medicare Other | Admitting: Vascular Surgery

## 2020-09-16 ENCOUNTER — Encounter (HOSPITAL_COMMUNITY)
Admission: RE | Disposition: A | Discharge status: Home / Self Care | Payer: Self-pay | Source: Home / Self Care | Attending: Orthopaedic Surgery

## 2020-09-16 ENCOUNTER — Ambulatory Visit (HOSPITAL_COMMUNITY): Payer: Medicare Other

## 2020-09-16 ENCOUNTER — Observation Stay (HOSPITAL_COMMUNITY)
Admission: RE | Admit: 2020-09-16 | Discharge: 2020-09-17 | Disposition: A | Discharge status: Home / Self Care | Payer: Medicare Other | Attending: Orthopaedic Surgery | Admitting: Orthopaedic Surgery

## 2020-09-16 DIAGNOSIS — I10 Essential (primary) hypertension: Secondary | ICD-10-CM | POA: Insufficient documentation

## 2020-09-16 DIAGNOSIS — Z79899 Other long term (current) drug therapy: Secondary | ICD-10-CM | POA: Insufficient documentation

## 2020-09-16 DIAGNOSIS — Z471 Aftercare following joint replacement surgery: Secondary | ICD-10-CM | POA: Diagnosis not present

## 2020-09-16 DIAGNOSIS — Z96641 Presence of right artificial hip joint: Secondary | ICD-10-CM

## 2020-09-16 DIAGNOSIS — M1611 Unilateral primary osteoarthritis, right hip: Secondary | ICD-10-CM | POA: Diagnosis not present

## 2020-09-16 DIAGNOSIS — Z87891 Personal history of nicotine dependence: Secondary | ICD-10-CM | POA: Insufficient documentation

## 2020-09-16 DIAGNOSIS — Z7982 Long term (current) use of aspirin: Secondary | ICD-10-CM | POA: Insufficient documentation

## 2020-09-16 DIAGNOSIS — I2510 Atherosclerotic heart disease of native coronary artery without angina pectoris: Secondary | ICD-10-CM | POA: Diagnosis not present

## 2020-09-16 DIAGNOSIS — Z96649 Presence of unspecified artificial hip joint: Secondary | ICD-10-CM

## 2020-09-16 DIAGNOSIS — Z419 Encounter for procedure for purposes other than remedying health state, unspecified: Secondary | ICD-10-CM

## 2020-09-16 DIAGNOSIS — E785 Hyperlipidemia, unspecified: Secondary | ICD-10-CM | POA: Diagnosis not present

## 2020-09-16 HISTORY — PX: TOTAL HIP ARTHROPLASTY: SHX124

## 2020-09-16 SURGERY — ARTHROPLASTY, HIP, TOTAL, ANTERIOR APPROACH
Anesthesia: Monitor Anesthesia Care | Site: Hip | Laterality: Right

## 2020-09-16 MED ORDER — KETOROLAC TROMETHAMINE 15 MG/ML IJ SOLN
15.0000 mg | Freq: Four times a day (QID) | INTRAMUSCULAR | Status: AC
  Administered 2020-09-16 – 2020-09-17 (×4): 15 mg via INTRAVENOUS
  Filled 2020-09-16 (×4): qty 1

## 2020-09-16 MED ORDER — VANCOMYCIN HCL 1000 MG IV SOLR
INTRAVENOUS | Status: AC
  Filled 2020-09-16: qty 1000

## 2020-09-16 MED ORDER — METOPROLOL TARTRATE 50 MG PO TABS
ORAL_TABLET | ORAL | Status: AC
  Administered 2020-09-16: 50 mg via ORAL
  Filled 2020-09-16: qty 1

## 2020-09-16 MED ORDER — ASPIRIN EC 81 MG PO TBEC
81.0000 mg | DELAYED_RELEASE_TABLET | Freq: Every day | ORAL | Status: DC
  Administered 2020-09-16 – 2020-09-17 (×2): 81 mg via ORAL
  Filled 2020-09-16 (×2): qty 1

## 2020-09-16 MED ORDER — METOPROLOL TARTRATE 50 MG PO TABS: 50.0000 mg | ORAL_TABLET | Freq: Once | ORAL | Status: AC

## 2020-09-16 MED ORDER — DOCUSATE SODIUM 100 MG PO CAPS
100.0000 mg | ORAL_CAPSULE | Freq: Two times a day (BID) | ORAL | Status: DC
  Administered 2020-09-16 – 2020-09-17 (×2): 100 mg via ORAL
  Filled 2020-09-16 (×2): qty 1

## 2020-09-16 MED ORDER — EZETIMIBE 10 MG PO TABS
10.0000 mg | ORAL_TABLET | Freq: Every day | ORAL | Status: DC
  Administered 2020-09-16 – 2020-09-17 (×2): 10 mg via ORAL
  Filled 2020-09-16 (×2): qty 1

## 2020-09-16 MED ORDER — LACTATED RINGERS IV SOLN: INTRAVENOUS | Status: DC | PRN

## 2020-09-16 MED ORDER — POVIDONE-IODINE 10 % EX SWAB: 2.0000 "application " | Freq: Once | CUTANEOUS | Status: DC

## 2020-09-16 MED ORDER — SODIUM CHLORIDE 0.9% FLUSH
INTRAVENOUS | Status: DC | PRN
  Administered 2020-09-16: 20 mL

## 2020-09-16 MED ORDER — OXYCODONE HCL 5 MG PO TABS: 10.0000 mg | ORAL_TABLET | ORAL | Status: DC | PRN

## 2020-09-16 MED ORDER — MIDAZOLAM HCL 2 MG/2ML IJ SOLN
INTRAMUSCULAR | Status: AC
  Filled 2020-09-16: qty 2

## 2020-09-16 MED ORDER — CHLORHEXIDINE GLUCONATE 0.12 % MT SOLN
15.0000 mL | Freq: Once | OROMUCOSAL | Status: AC
  Administered 2020-09-16: 15 mL via OROMUCOSAL
  Filled 2020-09-16: qty 15

## 2020-09-16 MED ORDER — ONDANSETRON HCL 4 MG/2ML IJ SOLN: 4.0000 mg | Freq: Four times a day (QID) | INTRAMUSCULAR | Status: DC | PRN

## 2020-09-16 MED ORDER — EPHEDRINE 5 MG/ML INJ
INTRAVENOUS | Status: AC
  Filled 2020-09-16: qty 10

## 2020-09-16 MED ORDER — DEXAMETHASONE SODIUM PHOSPHATE 10 MG/ML IJ SOLN
10.0000 mg | Freq: Once | INTRAMUSCULAR | Status: AC
  Administered 2020-09-17: 10 mg via INTRAVENOUS
  Filled 2020-09-16: qty 1

## 2020-09-16 MED ORDER — METHOCARBAMOL 1000 MG/10ML IJ SOLN
500.0000 mg | Freq: Four times a day (QID) | INTRAVENOUS | Status: DC | PRN
  Filled 2020-09-16: qty 5

## 2020-09-16 MED ORDER — ONDANSETRON HCL 4 MG/2ML IJ SOLN
INTRAMUSCULAR | Status: AC
  Filled 2020-09-16: qty 2

## 2020-09-16 MED ORDER — HYDROMORPHONE HCL 1 MG/ML IJ SOLN: 0.5000 mg | INTRAMUSCULAR | Status: DC | PRN

## 2020-09-16 MED ORDER — METHOCARBAMOL 500 MG PO TABS: 500.0000 mg | ORAL_TABLET | Freq: Four times a day (QID) | ORAL | Status: DC | PRN

## 2020-09-16 MED ORDER — METOCLOPRAMIDE HCL 5 MG/ML IJ SOLN: 5.0000 mg | Freq: Three times a day (TID) | INTRAMUSCULAR | Status: DC | PRN

## 2020-09-16 MED ORDER — PHENOL 1.4 % MT LIQD: 1.0000 | OROMUCOSAL | Status: DC | PRN

## 2020-09-16 MED ORDER — OXYCODONE HCL 5 MG PO TABS
5.0000 mg | ORAL_TABLET | ORAL | Status: DC | PRN
  Administered 2020-09-16 (×2): 5 mg via ORAL
  Filled 2020-09-16 (×2): qty 1

## 2020-09-16 MED ORDER — LACTATED RINGERS IV SOLN: INTRAVENOUS | Status: DC

## 2020-09-16 MED ORDER — ACETAMINOPHEN 10 MG/ML IV SOLN: 1000.0000 mg | Freq: Once | INTRAVENOUS | Status: DC | PRN

## 2020-09-16 MED ORDER — ACETAMINOPHEN 500 MG PO TABS
1000.0000 mg | ORAL_TABLET | Freq: Four times a day (QID) | ORAL | Status: AC
  Administered 2020-09-16 – 2020-09-17 (×4): 1000 mg via ORAL
  Filled 2020-09-16 (×4): qty 2

## 2020-09-16 MED ORDER — MIDAZOLAM HCL 5 MG/5ML IJ SOLN
INTRAMUSCULAR | Status: DC | PRN
  Administered 2020-09-16: 1 mg via INTRAVENOUS

## 2020-09-16 MED ORDER — FENTANYL CITRATE (PF) 100 MCG/2ML IJ SOLN
INTRAMUSCULAR | Status: AC
  Administered 2020-09-16: 50 ug via INTRAVENOUS
  Filled 2020-09-16: qty 2

## 2020-09-16 MED ORDER — CLOPIDOGREL BISULFATE 75 MG PO TABS
75.0000 mg | ORAL_TABLET | Freq: Every day | ORAL | Status: DC
  Administered 2020-09-17: 75 mg via ORAL
  Filled 2020-09-16: qty 1

## 2020-09-16 MED ORDER — STERILE WATER FOR INJECTION IJ SOLN
INTRAMUSCULAR | Status: DC | PRN
  Administered 2020-09-16: 1000 mL via TOPICAL

## 2020-09-16 MED ORDER — TRANEXAMIC ACID-NACL 1000-0.7 MG/100ML-% IV SOLN
1000.0000 mg | Freq: Once | INTRAVENOUS | Status: AC
  Administered 2020-09-16: 1000 mg via INTRAVENOUS
  Filled 2020-09-16: qty 100

## 2020-09-16 MED ORDER — CEFAZOLIN SODIUM-DEXTROSE 2-4 GM/100ML-% IV SOLN
2.0000 g | Freq: Four times a day (QID) | INTRAVENOUS | Status: AC
  Administered 2020-09-16 (×2): 2 g via INTRAVENOUS
  Filled 2020-09-16 (×2): qty 100

## 2020-09-16 MED ORDER — ONDANSETRON HCL 4 MG PO TABS: 4.0000 mg | ORAL_TABLET | Freq: Four times a day (QID) | ORAL | Status: DC | PRN

## 2020-09-16 MED ORDER — ORAL CARE MOUTH RINSE: 15.0000 mL | Freq: Once | OROMUCOSAL | Status: AC

## 2020-09-16 MED ORDER — PROPOFOL 10 MG/ML IV BOLUS
INTRAVENOUS | Status: AC
  Filled 2020-09-16: qty 20

## 2020-09-16 MED ORDER — BUPIVACAINE HCL (PF) 0.25 % IJ SOLN
INTRAMUSCULAR | Status: AC
  Filled 2020-09-16: qty 30

## 2020-09-16 MED ORDER — MAGNESIUM CITRATE PO SOLN: 1.0000 | Freq: Once | ORAL | Status: DC | PRN

## 2020-09-16 MED ORDER — DEXAMETHASONE SODIUM PHOSPHATE 10 MG/ML IJ SOLN
INTRAMUSCULAR | Status: DC | PRN
  Administered 2020-09-16: 5 mg via INTRAVENOUS

## 2020-09-16 MED ORDER — FENTANYL CITRATE (PF) 100 MCG/2ML IJ SOLN
25.0000 ug | INTRAMUSCULAR | Status: DC | PRN
  Administered 2020-09-16: 50 ug via INTRAVENOUS

## 2020-09-16 MED ORDER — OXYCODONE HCL ER 10 MG PO T12A
10.0000 mg | EXTENDED_RELEASE_TABLET | Freq: Two times a day (BID) | ORAL | Status: DC
  Administered 2020-09-16 – 2020-09-17 (×3): 10 mg via ORAL
  Filled 2020-09-16 (×3): qty 1

## 2020-09-16 MED ORDER — LIDOCAINE HCL (PF) 2 % IJ SOLN
INTRAMUSCULAR | Status: AC
  Filled 2020-09-16: qty 5

## 2020-09-16 MED ORDER — PROPOFOL 1000 MG/100ML IV EMUL
INTRAVENOUS | Status: AC
  Filled 2020-09-16: qty 100

## 2020-09-16 MED ORDER — EPINEPHRINE 1 MG/10ML IJ SOSY
PREFILLED_SYRINGE | INTRAMUSCULAR | Status: DC | PRN
  Administered 2020-09-16: .015 mg via SUBCUTANEOUS

## 2020-09-16 MED ORDER — SODIUM CHLORIDE 0.9 % IV SOLN
INTRAVENOUS | Status: DC | PRN
  Administered 2020-09-16: 20 mL

## 2020-09-16 MED ORDER — BUPIVACAINE HCL (PF) 0.25 % IJ SOLN
INTRAMUSCULAR | Status: DC | PRN
  Administered 2020-09-16: 20 mL

## 2020-09-16 MED ORDER — ONDANSETRON HCL 4 MG/2ML IJ SOLN
INTRAMUSCULAR | Status: DC | PRN
  Administered 2020-09-16: 4 mg via INTRAVENOUS

## 2020-09-16 MED ORDER — SODIUM CHLORIDE 0.9 % IR SOLN
Status: DC | PRN
  Administered 2020-09-16: 3000 mL

## 2020-09-16 MED ORDER — IRRISEPT - 450ML BOTTLE WITH 0.05% CHG IN STERILE WATER, USP 99.95% OPTIME
TOPICAL | Status: DC | PRN
  Administered 2020-09-16: 450 mL via TOPICAL

## 2020-09-16 MED ORDER — PROPOFOL 10 MG/ML IV BOLUS
INTRAVENOUS | Status: DC | PRN
  Administered 2020-09-16: 20 mg via INTRAVENOUS
  Administered 2020-09-16: 30 mg via INTRAVENOUS

## 2020-09-16 MED ORDER — DIPHENHYDRAMINE HCL 12.5 MG/5ML PO ELIX: 25.0000 mg | ORAL_SOLUTION | ORAL | Status: DC | PRN

## 2020-09-16 MED ORDER — METOPROLOL TARTRATE 50 MG PO TABS
50.0000 mg | ORAL_TABLET | Freq: Two times a day (BID) | ORAL | Status: DC
  Administered 2020-09-16 – 2020-09-17 (×2): 50 mg via ORAL
  Filled 2020-09-16 (×2): qty 1

## 2020-09-16 MED ORDER — 0.9 % SODIUM CHLORIDE (POUR BTL) OPTIME
TOPICAL | Status: DC | PRN
  Administered 2020-09-16: 1000 mL

## 2020-09-16 MED ORDER — TRANEXAMIC ACID-NACL 1000-0.7 MG/100ML-% IV SOLN
1000.0000 mg | INTRAVENOUS | Status: AC
  Administered 2020-09-16: 1000 mg via INTRAVENOUS
  Filled 2020-09-16: qty 100

## 2020-09-16 MED ORDER — VANCOMYCIN HCL 1 G IV SOLR
INTRAVENOUS | Status: DC | PRN
  Administered 2020-09-16: 1000 mg via TOPICAL

## 2020-09-16 MED ORDER — ALUM & MAG HYDROXIDE-SIMETH 200-200-20 MG/5ML PO SUSP: 30.0000 mL | ORAL | Status: DC | PRN

## 2020-09-16 MED ORDER — MENTHOL 3 MG MT LOZG: 1.0000 | LOZENGE | OROMUCOSAL | Status: DC | PRN

## 2020-09-16 MED ORDER — PROPOFOL 500 MG/50ML IV EMUL
INTRAVENOUS | Status: DC | PRN
  Administered 2020-09-16: 75 ug/kg/min via INTRAVENOUS

## 2020-09-16 MED ORDER — EPHEDRINE SULFATE-NACL 50-0.9 MG/10ML-% IV SOSY
PREFILLED_SYRINGE | INTRAVENOUS | Status: DC | PRN
  Administered 2020-09-16: 10 mg via INTRAVENOUS
  Administered 2020-09-16: 5 mg via INTRAVENOUS
  Administered 2020-09-16: 10 mg via INTRAVENOUS
  Administered 2020-09-16: 5 mg via INTRAVENOUS

## 2020-09-16 MED ORDER — FENTANYL CITRATE (PF) 250 MCG/5ML IJ SOLN
INTRAMUSCULAR | Status: DC | PRN
  Administered 2020-09-16: 25 ug via INTRAVENOUS

## 2020-09-16 MED ORDER — AMLODIPINE BESYLATE 5 MG PO TABS
5.0000 mg | ORAL_TABLET | Freq: Two times a day (BID) | ORAL | Status: DC
  Administered 2020-09-16 – 2020-09-17 (×2): 5 mg via ORAL
  Filled 2020-09-16 (×2): qty 1

## 2020-09-16 MED ORDER — SORBITOL 70 % SOLN
30.0000 mL | Freq: Every day | Status: DC | PRN
  Filled 2020-09-16: qty 30

## 2020-09-16 MED ORDER — DEXAMETHASONE SODIUM PHOSPHATE 10 MG/ML IJ SOLN
INTRAMUSCULAR | Status: AC
  Filled 2020-09-16: qty 1

## 2020-09-16 MED ORDER — EPINEPHRINE PF 1 MG/ML IJ SOLN
INTRAMUSCULAR | Status: AC
  Filled 2020-09-16: qty 1

## 2020-09-16 MED ORDER — ONDANSETRON HCL 4 MG/2ML IJ SOLN: 4.0000 mg | Freq: Once | INTRAMUSCULAR | Status: DC | PRN

## 2020-09-16 MED ORDER — BUPIVACAINE IN DEXTROSE 0.75-8.25 % IT SOLN
INTRATHECAL | Status: DC | PRN
  Administered 2020-09-16: 13.5 mg via INTRATHECAL

## 2020-09-16 MED ORDER — SODIUM CHLORIDE 0.9 % IV SOLN: INTRAVENOUS | Status: DC

## 2020-09-16 MED ORDER — TRANEXAMIC ACID 1000 MG/10ML IV SOLN
INTRAVENOUS | Status: DC | PRN
  Administered 2020-09-16: 2000 mg via TOPICAL

## 2020-09-16 MED ORDER — METOCLOPRAMIDE HCL 5 MG PO TABS: 5.0000 mg | ORAL_TABLET | Freq: Three times a day (TID) | ORAL | Status: DC | PRN

## 2020-09-16 MED ORDER — LIDOCAINE 2% (20 MG/ML) 5 ML SYRINGE
INTRAMUSCULAR | Status: DC | PRN
  Administered 2020-09-16: 60 mg via INTRAVENOUS

## 2020-09-16 MED ORDER — HYDRALAZINE HCL 25 MG PO TABS
25.0000 mg | ORAL_TABLET | Freq: Three times a day (TID) | ORAL | Status: DC
  Administered 2020-09-16 – 2020-09-17 (×3): 25 mg via ORAL
  Filled 2020-09-16 (×3): qty 1

## 2020-09-16 MED ORDER — FENTANYL CITRATE (PF) 250 MCG/5ML IJ SOLN
INTRAMUSCULAR | Status: AC
  Filled 2020-09-16: qty 5

## 2020-09-16 MED ORDER — ACETAMINOPHEN 325 MG PO TABS: 325.0000 mg | ORAL_TABLET | Freq: Four times a day (QID) | ORAL | Status: DC | PRN

## 2020-09-16 MED ORDER — POLYETHYLENE GLYCOL 3350 17 G PO PACK: 17.0000 g | PACK | Freq: Every day | ORAL | Status: DC | PRN

## 2020-09-16 SURGICAL SUPPLY — 65 items
ADH SKN CLS APL DERMABOND .7 (GAUZE/BANDAGES/DRESSINGS) ×1
BAG DECANTER FOR FLEXI CONT (MISCELLANEOUS) ×3 IMPLANT
CELLS DAT CNTRL 66122 CELL SVR (MISCELLANEOUS) ×1 IMPLANT
COVER PERINEAL POST (MISCELLANEOUS) ×3 IMPLANT
COVER SURGICAL LIGHT HANDLE (MISCELLANEOUS) ×3 IMPLANT
CUP ACET PNNCL SECTR W/GRIP 56 (Hips) IMPLANT
DERMABOND ADVANCED (GAUZE/BANDAGES/DRESSINGS) ×2
DERMABOND ADVANCED .7 DNX12 (GAUZE/BANDAGES/DRESSINGS) IMPLANT
DRAPE C-ARM 42X72 X-RAY (DRAPES) ×3 IMPLANT
DRAPE POUCH INSTRU U-SHP 10X18 (DRAPES) ×3 IMPLANT
DRAPE STERI IOBAN 125X83 (DRAPES) ×3 IMPLANT
DRAPE U-SHAPE 47X51 STRL (DRAPES) ×6 IMPLANT
DRSG AQUACEL AG ADV 3.5X10 (GAUZE/BANDAGES/DRESSINGS) ×3 IMPLANT
DURAPREP 26ML APPLICATOR (WOUND CARE) ×6 IMPLANT
ELECT BLADE 4.0 EZ CLEAN MEGAD (MISCELLANEOUS) ×3
ELECT REM PT RETURN 9FT ADLT (ELECTROSURGICAL) ×3
ELECTRODE BLDE 4.0 EZ CLN MEGD (MISCELLANEOUS) ×1 IMPLANT
ELECTRODE REM PT RTRN 9FT ADLT (ELECTROSURGICAL) ×1 IMPLANT
GLOVE BIOGEL PI IND STRL 7.0 (GLOVE) ×1 IMPLANT
GLOVE BIOGEL PI INDICATOR 7.0 (GLOVE) ×6
GLOVE ECLIPSE 7.0 STRL STRAW (GLOVE) ×6 IMPLANT
GLOVE SKINSENSE NS SZ7.5 (GLOVE) ×6
GLOVE SKINSENSE STRL SZ7.5 (GLOVE) ×1 IMPLANT
GLOVE SURG SYN 7.5  E (GLOVE) ×12
GLOVE SURG SYN 7.5 E (GLOVE) ×4 IMPLANT
GLOVE SURG SYN 7.5 PF PI (GLOVE) ×4 IMPLANT
GOWN STRL REIN XL XLG (GOWN DISPOSABLE) ×3 IMPLANT
GOWN STRL REUS W/ TWL LRG LVL3 (GOWN DISPOSABLE) IMPLANT
GOWN STRL REUS W/ TWL XL LVL3 (GOWN DISPOSABLE) ×1 IMPLANT
GOWN STRL REUS W/TWL LRG LVL3 (GOWN DISPOSABLE) ×3
GOWN STRL REUS W/TWL XL LVL3 (GOWN DISPOSABLE) ×6
HANDPIECE INTERPULSE COAX TIP (DISPOSABLE) ×3
HEAD CERAMIC DELTA 36 PLUS 1.5 (Hips) ×2 IMPLANT
HOOD PEEL AWAY FLYTE STAYCOOL (MISCELLANEOUS) ×6 IMPLANT
IV NS IRRIG 3000ML ARTHROMATIC (IV SOLUTION) ×3 IMPLANT
JET LAVAGE IRRISEPT WOUND (IRRIGATION / IRRIGATOR) ×3
KIT BASIN OR (CUSTOM PROCEDURE TRAY) ×3 IMPLANT
LAVAGE JET IRRISEPT WOUND (IRRIGATION / IRRIGATOR) ×1 IMPLANT
MARKER SKIN DUAL TIP RULER LAB (MISCELLANEOUS) ×3 IMPLANT
NDL SPNL 18GX3.5 QUINCKE PK (NEEDLE) ×1 IMPLANT
NEEDLE SPNL 18GX3.5 QUINCKE PK (NEEDLE) ×3 IMPLANT
PACK TOTAL JOINT (CUSTOM PROCEDURE TRAY) ×3 IMPLANT
PACK UNIVERSAL I (CUSTOM PROCEDURE TRAY) ×3 IMPLANT
PINN SECTOR W/GRIP ACE CUP 56 (Hips) ×3 IMPLANT
PINNACLE ALTRX PLUS 4 N 36X56 (Hips) ×2 IMPLANT
RETRACTOR WND ALEXIS 18 MED (MISCELLANEOUS) IMPLANT
RTRCTR WOUND ALEXIS 18CM MED (MISCELLANEOUS) ×3
SAW OSC TIP CART 19.5X105X1.3 (SAW) ×3 IMPLANT
SCREW 6.5MMX25MM (Screw) ×2 IMPLANT
SET HNDPC FAN SPRY TIP SCT (DISPOSABLE) ×1 IMPLANT
STAPLER VISISTAT 35W (STAPLE) ×2 IMPLANT
STEM FEM ACTIS HIGH SZ8 (Stem) ×2 IMPLANT
SUT ETHIBOND 2 V 37 (SUTURE) ×3 IMPLANT
SUT VIC AB 0 CT1 27 (SUTURE) ×3
SUT VIC AB 0 CT1 27XBRD ANBCTR (SUTURE) ×1 IMPLANT
SUT VIC AB 1 CTX 36 (SUTURE) ×3
SUT VIC AB 1 CTX36XBRD ANBCTR (SUTURE) ×1 IMPLANT
SUT VIC AB 2-0 CT1 27 (SUTURE) ×12
SUT VIC AB 2-0 CT1 TAPERPNT 27 (SUTURE) ×2 IMPLANT
SYR 50ML LL SCALE MARK (SYRINGE) ×5 IMPLANT
TOWEL GREEN STERILE (TOWEL DISPOSABLE) ×3 IMPLANT
TRAY CATH 16FR W/PLASTIC CATH (SET/KITS/TRAYS/PACK) IMPLANT
TRAY FOLEY W/BAG SLVR 16FR (SET/KITS/TRAYS/PACK) ×3
TRAY FOLEY W/BAG SLVR 16FR ST (SET/KITS/TRAYS/PACK) ×1 IMPLANT
YANKAUER SUCT BULB TIP NO VENT (SUCTIONS) ×3 IMPLANT

## 2020-09-16 NOTE — Evaluation (Signed)
Physical Therapy Evaluation Patient Details Name: Sean Gardner MRN: [id number] DOB: 09-15-1956 Today's Date: 09/16/2020   History of Present Illness  Pt is a 64 y/o male s/p R THA, direct anterior. PMH inlcudes HTN, lung cancer, PVD and s/p CABG.   Clinical Impression  Pt is s/p surgery above with deficits below. Requiring min A to perform bed mobility tasks. Pt reporting increased nausea and light headedness upon sitting, therefore further mobility limited. Reports wife will be able to assist at home. Will continue to follow acutely.     Follow Up Recommendations Follow surgeon's recommendation for DC plan and follow-up therapies    Equipment Recommendations  Rolling walker with 5" wheels;3in1 (PT)    Recommendations for Other Services       Precautions / Restrictions Precautions Precautions: None Restrictions Weight Bearing Restrictions: Yes RLE Weight Bearing: Weight bearing as tolerated      Mobility  Bed Mobility Overal bed mobility: Needs Assistance Bed Mobility: Supine to Sit;Sit to Supine     Supine to sit: Min assist Sit to supine: Min assist   General bed mobility comments: Min A for RLE assist. Increased time to come to sitting. Pt reporting lightheadedness and nausea upon sitting, therefore, returned to supine.     Transfers                    Ambulation/Gait                Stairs            Wheelchair Mobility    Modified Rankin (Stroke Patients Only)       Balance Overall balance assessment: Needs assistance Sitting-balance support: Feet supported;No upper extremity supported Sitting balance-Leahy Scale: Good                                       Pertinent Vitals/Pain Pain Assessment: 0-10 Pain Score: 6  Pain Location: R hip  Pain Descriptors / Indicators: Aching;Operative site guarding Pain Intervention(s): Limited activity within patient's tolerance;Monitored during session;Repositioned    Home  Living Family/patient expects to be discharged to:: Private residence Living Arrangements: Spouse/significant other;Other relatives Available Help at Discharge: Family Type of Home: House Home Access: Stairs to enter Entrance Stairs-Rails: None Entrance Stairs-Number of Steps: 2-3 Home Layout: One level Home Equipment: None      Prior Function Level of Independence: Independent               Hand Dominance        Extremity/Trunk Assessment   Upper Extremity Assessment Upper Extremity Assessment: Overall WFL for tasks assessed    Lower Extremity Assessment Lower Extremity Assessment: RLE deficits/detail RLE Deficits / Details: Deficits consistent with post op pain and weakness.     Cervical / Trunk Assessment Cervical / Trunk Assessment: Normal  Communication   Communication: No difficulties  Cognition Arousal/Alertness: Awake/alert Behavior During Therapy: WFL for tasks assessed/performed Overall Cognitive Status: Within Functional Limits for tasks assessed                                        General Comments      Exercises Total Joint Exercises Ankle Circles/Pumps: AROM;Both;10 reps   Assessment/Plan    PT Assessment Patient needs continued PT services  PT Problem List Decreased strength;Decreased balance;Decreased  activity tolerance;Decreased range of motion;Decreased mobility;Decreased knowledge of use of DME;Decreased knowledge of precautions       PT Treatment Interventions DME instruction;Gait training;Stair training;Functional mobility training;Therapeutic activities;Therapeutic exercise;Balance training;Patient/family education    PT Goals (Current goals can be found in the Care Plan section)  Acute Rehab PT Goals Patient Stated Goal: to go home PT Goal Formulation: With patient Time For Goal Achievement: 09/30/20 Potential to Achieve Goals: Good    Frequency 7X/week   Barriers to discharge        Co-evaluation                AM-PAC PT "6 Clicks" Mobility  Outcome Measure Help needed turning from your back to your side while in a flat bed without using bedrails?: A Little Help needed moving from lying on your back to sitting on the side of a flat bed without using bedrails?: A Little Help needed moving to and from a bed to a chair (including a wheelchair)?: A Little Help needed standing up from a chair using your arms (e.g., wheelchair or bedside chair)?: A Little Help needed to walk in hospital room?: A Lot Help needed climbing 3-5 steps with a railing? : A Lot 6 Click Score: 16    End of Session   Activity Tolerance: Treatment limited secondary to medical complications (Comment) (nausea/lightheadedness) Patient left: in bed;with call bell/phone within reach;with family/visitor present Nurse Communication: Mobility status PT Visit Diagnosis: Unsteadiness on feet (R26.81);Muscle weakness (generalized) (M62.81)    Time: 2035-5974 PT Time Calculation (min) (ACUTE ONLY): 19 min   Charges:   PT Evaluation $PT Eval Low Complexity: 1 Low          Lou Miner, DPT  Acute Rehabilitation Services  Pager: [phone] Office: [phone]   Rudean Hitt 09/16/2020, 2:25 PM

## 2020-09-16 NOTE — Anesthesia Procedure Notes (Signed)
Procedure Name: MAC Date/Time: 09/16/2020 7:35 AM Performed by: Trinna Post., CRNA Pre-anesthesia Checklist: Patient identified, Emergency Drugs available, Suction available, Patient being monitored and Timeout performed Patient Re-evaluated:Patient Re-evaluated prior to induction Oxygen Delivery Method: Simple face mask Preoxygenation: Pre-oxygenation with 100% oxygen Induction Type: IV induction Placement Confirmation: positive ETCO2

## 2020-09-16 NOTE — Anesthesia Procedure Notes (Signed)
Spinal  Patient location during procedure: OR Start time: 09/16/2020 7:34 AM End time: 09/16/2020 7:55 AM Staffing Anesthesiologist: Barnet Glasgow, MD Preanesthetic Checklist Completed: patient identified, IV checked, site marked, risks and benefits discussed, surgical consent, monitors and equipment checked, pre-op evaluation and timeout performed Spinal Block Patient position: sitting Prep: DuraPrep Patient monitoring: heart rate, cardiac monitor, continuous pulse ox and blood pressure Approach: midline Location: L3-4 Injection technique: single-shot Needle Needle type: Sprotte  Needle gauge: 27 G Needle length: 12.7 cm Catheter type: closed end flexible Catheter size: 19 g Assessment Sensory level: T4 Additional Notes 3 atempts

## 2020-09-16 NOTE — Progress Notes (Signed)
RT set up CPAP with patient's home mask and placed at bedside. Patient stated he will place himself on CPAP when he is ready. RT instructed patient to have RT called if he needs assistance. RT will monitor as needed.

## 2020-09-16 NOTE — Plan of Care (Signed)
Patient is s/p right total hip replacement POD #0. Foley catheter still in place per post op orders - will remove in the AM. Pain controlled with scheduled and prn meds as ordered. Dsg to right hip C/D/I. Will continue to monitor and continue current POC.

## 2020-09-16 NOTE — Care Plan (Signed)
Ortho Bundle Case Management Note  Patient Details  Name: Sean Gardner MRN: [id number] Date of Birth: 28-Feb-1956  [facility] call to patient to discuss his upcoming Right total hip arthroplasty on 09/16/20 with Dr. Erlinda Hong. Patient is an Ortho bundle patient through [facility] and is agreeable to case management. He has a wife that will be able to assist him after discharge. He needs a FWW and BSC/3in1 for home use. CM ordered these through [employer] to be delivered to hospital room prior to discharge. Anticipate HHPT to be needed after short hospital stay. Choice provided and referral made to [facility]. Reviewed all post-op care instructions and allowed patient to ask questions. Will continue to follow for needs.         DME Arranged:  3-N-1, Walker rolling DME Agency:  [employer]  HH Arranged:  PT [place] Agency:  [facility] (now [facility])  Additional Comments: Please contact me with any questions of if this plan should need to change.  Jamse Arn, RN, BSN, [employer]  [phone] 09/16/2020, 3:04 PM

## 2020-09-16 NOTE — H&P (Signed)
PREOPERATIVE H&P  Chief Complaint: right hip degenerative joint disease  HPI: Sean Gardner is a 64 y.o. male who presents for surgical treatment of right hip degenerative joint disease.  He denies any changes in medical history.  Past Medical History:  Diagnosis Date  . Claudication in peripheral vascular disease ([place])   . Hyperlipidemia   . Hypertension   . Lung cancer ([place])    neuroendocrine lung ca dx 2010  . Myocardial infarction ([place]) 07/2000  . Peripheral vascular disease ([place])   . Shortness of breath    on exertion,can't lay on his back  . Sleep apnea    cpap   Past Surgical History:  Procedure Laterality Date  . ACHILLES TENDON SURGERY    . CORONARY ARTERY BYPASS GRAFT  08/18/2000   LIMA--dLIMA, RIMA-dRCA, left RA-OM2, SVG-D2  . FLEXIBLE SIGMOIDOSCOPY N/A 01/18/2013   Procedure: FLEXIBLE SIGMOIDOSCOPY;  Surgeon: Arta Silence, MD;  Location: [facility];  Service: Endoscopy;  Laterality: N/A;  . HERNIA REPAIR     inguinal  . HOT HEMOSTASIS N/A 01/18/2013   Procedure: HOT HEMOSTASIS (ARGON PLASMA COAGULATION/BICAP);  Surgeon: Arta Silence, MD;  Location: [facility];  Service: Endoscopy;  Laterality: N/A;  . LUNG SURGERY Left 01/29/2009   left lower lobectomy    Social History   Socioeconomic History  . Marital status: Married    Spouse name: Not on file  . Number of children: 2  . Years of education: Not on file  . Highest education level: Not on file  Occupational History  . Not on file  Tobacco Use  . Smoking status: Former Smoker    Packs/day: 2.00    Years: 20.00    Pack years: 40.00    Types: Cigarettes    Quit date: 10/19/2008    Years since quitting: 11.9  . Smokeless tobacco: Never Used  Vaping Use  . Vaping Use: Never used  Substance and Sexual Activity  . Alcohol use: Yes    Comment: occ  . Drug use: No  . Sexual activity: Not on file  Other Topics Concern  . Not on file  Social History Narrative  . Not on file   Social  Determinants of Health   Financial Resource Strain:   . Difficulty of Paying Living Expenses: Not on file  Food Insecurity:   . Worried About Charity fundraiser in the Last Year: Not on file  . Ran Out of Food in the Last Year: Not on file  Transportation Needs:   . Lack of Transportation (Medical): Not on file  . Lack of Transportation (Non-Medical): Not on file  Physical Activity:   . Days of Exercise per Week: Not on file  . Minutes of Exercise per Session: Not on file  Stress:   . Feeling of Stress : Not on file  Social Connections:   . Frequency of Communication with Friends and Family: Not on file  . Frequency of Social Gatherings with Friends and Family: Not on file  . Attends Religious Services: Not on file  . Active Member of Clubs or Organizations: Not on file  . Attends Archivist Meetings: Not on file  . Marital Status: Not on file   Family History  Problem Relation Age of Onset  . Hypertension Mother   . Lung cancer Father    Allergies  Allergen Reactions  . Lisinopril Other (See Comments)    Hyperkalemia  . Zolpidem Tartrate Other (See Comments)    Felt  fuzzy and hallucinations   Prior to Admission medications   Medication Sig Start Date End Date Taking? Authorizing Provider  amLODipine (NORVASC) 5 MG tablet Take 1 tablet (5 mg total) by mouth daily. Patient taking differently: Take 5 mg by mouth in the morning and at bedtime.  02/14/20 09/03/20 Yes Miquel Dunn, NP  aspirin 81 MG tablet Take 81 mg by mouth daily.   Yes [provider]  clopidogrel (PLAVIX) 75 MG tablet Take 1 tablet (75 mg total) by mouth daily before breakfast. 01/19/13  Yes Arta Silence, MD  diphenhydramine-acetaminophen (TYLENOL PM) 25-500 MG TABS tablet Take 1 tablet by mouth at bedtime as needed (Pain/sleep).    Yes [provider]  ezetimibe (ZETIA) 10 MG tablet Take 10 mg by mouth daily.   Yes [provider]  hydrALAZINE (APRESOLINE) 25  MG tablet TAKE 1 TABLET BY MOUTH THREE TIMES A DAY Patient taking differently: Take 25 mg by mouth 3 (three) times daily.  07/22/20  Yes Tolia, Sunit, DO  metoprolol (LOPRESSOR) 50 MG tablet Take 50 mg by mouth 2 (two) times daily. Hold if systolic blood pressure (top blood pressure number) less than 100 mmHg or heart rate less than 60 bpm (pulse).   Yes [provider]  omeprazole (PRILOSEC) 20 MG capsule Take 20 mg by mouth daily.   Yes [provider]  rosuvastatin (CRESTOR) 40 MG tablet Take 40 mg by mouth daily.   Yes [provider]  SYMBICORT 160-4.5 MCG/ACT inhaler Inhale 2 puffs into the lungs daily as needed (Shortness of breath).  02/07/16  Yes [provider]  Vitamin D, Ergocalciferol, (DRISDOL) 50000 UNITS CAPS Take 50,000 Units by mouth every Saturday.    Yes [provider]  aspirin EC 325 MG tablet Take 1 tablet (325 mg total) by mouth daily. Take one table once daily (instead of aspirin 81mg ) AFTER surgery to prevent blood clots 09/10/20   Aundra Dubin, PA-C  docusate sodium (COLACE) 100 MG capsule Take 1 capsule (100 mg total) by mouth daily as needed. 09/10/20 09/10/21  Aundra Dubin, PA-C  Evolocumab (REPATHA SURECLICK) 604 MG/ML SOAJ Inject 140 mg into the skin every 14 (fourteen) days. 09/09/20   Adrian Prows, MD  methocarbamol (ROBAXIN) 500 MG tablet Take 1 tablet (500 mg total) by mouth 2 (two) times daily as needed. To take post-op as needed for pain 09/10/20   Aundra Dubin, PA-C  ondansetron (ZOFRAN) 4 MG tablet Take 1 tablet (4 mg total) by mouth every 8 (eight) hours as needed for nausea or vomiting. 09/10/20   Aundra Dubin, PA-C  oxyCODONE-acetaminophen (PERCOCET) 5-325 MG tablet Take 1-2 tablets by mouth every 6 (six) hours as needed. To take post-op as needed for pain 09/10/20   Aundra Dubin, PA-C     Positive ROS: All other systems have been reviewed and were otherwise negative with the exception of those  mentioned in the HPI and as above.  Physical Exam: General: Alert, no acute distress Cardiovascular: No pedal edema Respiratory: No cyanosis, no use of accessory musculature GI: abdomen soft Skin: No lesions in the area of chief complaint Neurologic: Sensation intact distally Psychiatric: Patient is competent for consent with normal mood and affect Lymphatic: no lymphedema  MUSCULOSKELETAL: exam stable  Assessment: right hip degenerative joint disease  Plan: Plan for Procedure(s): RIGHT TOTAL HIP ARTHROPLASTY ANTERIOR APPROACH  The risks benefits and alternatives were discussed with the patient including but not limited to the risks of  nonoperative treatment, versus surgical intervention including infection, bleeding, nerve injury,  blood clots, cardiopulmonary complications, morbidity, mortality, among others, and they were willing to proceed.   Preoperative templating of the joint replacement has been completed, documented, and submitted to the Operating Room personnel in order to optimize intra-operative equipment management.   Eduard Roux, MD 09/16/2020 6:14 AM

## 2020-09-16 NOTE — OR Nursing (Signed)
Pt is awake,alert and oriented.Pt and/or family verbalized understanding of poc and discharge instructions. Reviewed admission and on going care with receiving RN. Pt is in NAD at this time and is ready to be transferred to floor. Will con't to monitor until pt is transferred. Belongings on bed with patient Pt had spinal able to move legs, feels sensation.

## 2020-09-16 NOTE — Discharge Instructions (Signed)

## 2020-09-16 NOTE — Op Note (Signed)
RIGHT TOTAL HIP ARTHROPLASTY ANTERIOR APPROACH  Procedure Note DENCIL CAYSON   [id number]  Pre-op Diagnosis: right hip degenerative joint disease     Post-op Diagnosis: same   Operative Procedures  1. Total hip replacement; Right hip; uncemented [id number]   Personnel  Surgeon(s): Leandrew Koyanagi, MD  Assist: Madalyn Rob, PA-C; necessary for the timely completion of procedure and due to complexity of procedure.   Anesthesia: spinal, exparel local  Prosthesis: Depuy Acetabulum: Pinnacle 56 mm Femur: Actis 8 HO Head: 36 mm size: +1.5 Liner: +4 Bearing Type: ceramic on poly  Total Hip Arthroplasty (Anterior Approach) Op Note:  After informed consent was obtained and the operative extremity marked in the holding area, the patient was brought back to the operating room and placed supine on the HANA table. Next, the operative extremity was prepped and draped in normal sterile fashion. Surgical timeout occurred verifying patient identification, surgical site, surgical procedure and administration of antibiotics.  A modified anterior Smith-Peterson approach to the hip was performed, using the interval between tensor fascia lata and sartorius.  Dissection was carried bluntly down onto the anterior hip capsule. The lateral femoral circumflex vessels were identified and coagulated. A capsulotomy was performed and the capsular flaps tagged for later repair.  The neck osteotomy was performed. The femoral head was removed, the acetabular rim was cleared of soft tissue and calcified labrum and attention was turned to reaming the acetabulum.  Sequential reaming was performed under fluoroscopic guidance. We reamed to a size 55 mm, and then impacted the acetabular shell. A 25 mm cancellous screw was placed through the shell for added fixation.  The liner was then placed after irrigation and attention turned to the femur.  After placing the femoral hook, the leg was taken to externally rotated,  extended and adducted position taking care to perform soft tissue releases to allow for adequate mobilization of the femur. Soft tissue was cleared from the shoulder of the greater trochanter and the hook elevator used to improve exposure of the proximal femur. Sequential broaching performed up to a size 8. Trial neck and head were placed. The leg was brought back up to neutral and the construct reduced.  Antibiotic irrigation was placed in the surgical wound and kept for at least 1 minute.  The position and sizing of components, offset and leg lengths were checked using fluoroscopy. Stability of the construct was checked in extension and external rotation without any subluxation or impingement of prosthesis. We dislocated the prosthesis, dropped the leg back into position, removed trial components, and irrigated copiously. The final stem and head was then placed, the leg brought back up, the system reduced and fluoroscopy used to verify positioning.  We irrigated, obtained hemostasis and closed the capsule using #2 ethibond suture.  One gram of vancomycin powder was placed in the surgical bed. A dilute solution of 20 cc of normal saline, 1.3% exparel, 0.25% bupivacaine was injected in the soft tissues.  One gram of topical tranexamic acid was injected into the joint.  The fascia was closed with #1 vicryl plus, the deep fat layer was closed with 0 vicryl, the subcutaneous layers closed with 2.0 Vicryl Plus and the skin closed with 2.0 nylon and dermabond. A sterile dressing was applied. The patient was awakened in the operating room and taken to recovery in stable condition.  All sponge, needle, and instrument counts were correct at the end of the case.   Position: supine  Complications: see description of  procedure.  Time Out: performed   Drains/Packing: none  Estimated blood loss: see anesthesia record  Returned to Recovery Room: in good condition.   Antibiotics: yes   Mechanical VTE (DVT)  Prophylaxis: sequential compression devices, TED thigh-high  Chemical VTE (DVT) Prophylaxis: resume aspirin, plavix   Fluid Replacement: see anesthesia record  Specimens Removed: 1 to pathology   Sponge and Instrument Count Correct? yes   PACU: portable radiograph - low AP   Plan/RTC: Return in 2 weeks for staple removal. Weight Bearing/Load Lower Extremity: full  Hip precautions: none Suture Removal: 2 weeks   N. Eduard Roux, MD [facility] 9:20 AM   Implant Name Type Inv. Item Serial No. Manufacturer Lot No. LRB No. Used Action  PINN SECTOR W/GRIP ACE CUP 56 - [id number] Hips PINN SECTOR W/GRIP ACE CUP 56  [employer] [id number] Right 1 Implanted  PINNACLE ALTRX PLUS 4 N 36X56 - [id number] Hips PINNACLE ALTRX PLUS 4 N 36X56  [employer] [id number] Right 1 Implanted  SCREW 6.5MMX25MM - [id number] Screw SCREW 6.5MMX25MM  [employer] [id number] Right 1 Implanted  STEM FEM ACTIS HIGH SZ8 - [id number] Stem STEM FEM ACTIS HIGH SZ8  [employer] [id number] Right 1 Implanted  HEAD CERAMIC DELTA 36 PLUS 1.5 - [id number] Hips HEAD CERAMIC DELTA 36 PLUS 1.5  [employer] [id number] Right 1 Implanted

## 2020-09-16 NOTE — Plan of Care (Signed)

## 2020-09-16 NOTE — Transfer of Care (Signed)
Immediate Anesthesia Transfer of Care Note  Patient: Sean Gardner  Procedure(s) Performed: RIGHT TOTAL HIP ARTHROPLASTY ANTERIOR APPROACH (Right Hip)  Patient Location: PACU  Anesthesia Type:MAC and Spinal  Level of Consciousness: awake, alert , oriented and drowsy  Airway & Oxygen Therapy: Patient Spontanous Breathing and Patient connected to face mask oxygen  Post-op Assessment: Report given to RN and Post -op Vital signs reviewed and stable  Post vital signs: Reviewed and stable  Last Vitals:  Vitals Value Taken Time  BP 112/67 09/16/20 0959  Temp    Pulse 65 09/16/20 1001  Resp 17 09/16/20 1001  SpO2 98 % 09/16/20 1001  Vitals shown include unvalidated device data.  Last Pain:  Vitals:   09/16/20 0634  TempSrc: Oral  PainSc:          Complications: No complications documented.

## 2020-09-16 NOTE — Anesthesia Postprocedure Evaluation (Signed)
Anesthesia Post Note  Patient: Sean Gardner  Procedure(s) Performed: RIGHT TOTAL HIP ARTHROPLASTY ANTERIOR APPROACH (Right Hip)     Patient location during evaluation: Nursing Unit Anesthesia Type: Spinal Level of consciousness: oriented and awake and alert Pain management: pain level controlled Vital Signs Assessment: post-procedure vital signs reviewed and stable Respiratory status: spontaneous breathing and respiratory function stable Cardiovascular status: blood pressure returned to baseline and stable Postop Assessment: no headache, no backache, no apparent nausea or vomiting and patient able to bend at knees Anesthetic complications: no   No complications documented.  Last Vitals:  Vitals:   09/16/20 1030 09/16/20 1045  BP: 124/65 (!) 119/58  Pulse: 63 60  Resp: 19 17  Temp:    SpO2: 97% 98%    Last Pain:  Vitals:   09/16/20 1045  TempSrc:   PainSc: 5                  Barnet Glasgow

## 2020-09-17 ENCOUNTER — Encounter (HOSPITAL_COMMUNITY): Payer: Self-pay | Admitting: Orthopaedic Surgery

## 2020-09-17 DIAGNOSIS — M1611 Unilateral primary osteoarthritis, right hip: Secondary | ICD-10-CM | POA: Diagnosis not present

## 2020-09-17 DIAGNOSIS — Z79899 Other long term (current) drug therapy: Secondary | ICD-10-CM | POA: Diagnosis not present

## 2020-09-17 DIAGNOSIS — Z7982 Long term (current) use of aspirin: Secondary | ICD-10-CM | POA: Diagnosis not present

## 2020-09-17 DIAGNOSIS — Z87891 Personal history of nicotine dependence: Secondary | ICD-10-CM | POA: Diagnosis not present

## 2020-09-17 DIAGNOSIS — I10 Essential (primary) hypertension: Secondary | ICD-10-CM | POA: Diagnosis not present

## 2020-09-17 LAB — CBC
HCT: 36 % — ABNORMAL LOW (ref 39.0–52.0)
Hemoglobin: 12 g/dL — ABNORMAL LOW (ref 13.0–17.0)
MCH: 32.7 pg (ref 26.0–34.0)
MCHC: 33.3 g/dL (ref 30.0–36.0)
MCV: 98.1 fL (ref 80.0–100.0)
Platelets: 202 10*3/uL (ref 150–400)
RBC: 3.67 MIL/uL — ABNORMAL LOW (ref 4.22–5.81)
RDW: 13.8 % (ref 11.5–15.5)
WBC: 13.2 10*3/uL — ABNORMAL HIGH (ref 4.0–10.5)
nRBC: 0 % (ref 0.0–0.2)

## 2020-09-17 LAB — BASIC METABOLIC PANEL
Anion gap: 5 (ref 5–15)
BUN: 15 mg/dL (ref 8–23)
CO2: 24 mmol/L (ref 22–32)
Calcium: 8.8 mg/dL — ABNORMAL LOW (ref 8.9–10.3)
Chloride: 101 mmol/L (ref 98–111)
Creatinine, Ser: 1.05 mg/dL (ref 0.61–1.24)
GFR, Estimated: >60 mL/min (ref >60)
Glucose, Bld: 122 mg/dL — ABNORMAL HIGH (ref 70–99)
Potassium: 4.4 mmol/L (ref 3.5–5.1)
Sodium: 130 mmol/L — ABNORMAL LOW (ref 135–145)

## 2020-09-17 NOTE — Progress Notes (Signed)
Physical Therapy Treatment Patient Details Name: Sean Gardner MRN: [id number] DOB: 1956/09/16 Today's Date: 09/17/2020    History of Present Illness Pt is a 64 y/o male s/p R THA, direct anterior. PMH inlcudes HTN, lung cancer, PVD and s/p CABG.     PT Comments    Continuing work on functional mobility and activity tolerance;  Session focused on answering questions related to dc'ing home today, and getting in and out of high bed with steps stool; performed well; OK for dc home from PT standpoint    Follow Up Recommendations  Follow surgeon's recommendation for DC plan and follow-up therapies     Equipment Recommendations  Rolling walker with 5" wheels;3in1 (PT) (already delivered to room)    Recommendations for Other Services       Precautions / Restrictions Precautions Precautions: None Restrictions RLE Weight Bearing: Weight bearing as tolerated    Mobility  Bed Mobility Overal bed mobility: Needs Assistance Bed Mobility: Supine to Sit;Sit to Supine     Supine to sit: Min guard Sit to supine: Min assist   General bed mobility comments: Practiced getting in and out of high bed with stepstool to approximate home; used sheet roll and leg lifter to get in bed with very light min assist; Sean Gardner to get OOB to step stool  Transfers Overall transfer level: Needs assistance Equipment used: Rolling walker (2 wheeled) Transfers: Sit to/from Stand Sit to Stand: Supervision         General transfer comment: Supervision for safety; Able to stand from various seat heights; slow rise, but no physical assist needed  Ambulation/Gait             General Gait Details: Small steps in and around th eroom with RW   Stairs             Wheelchair Mobility    Modified Rankin (Stroke Patients Only)       Balance     Sitting balance-Sean Gardner Scale: Good       Standing balance-Sean Gardner Scale: Fair                              Cognition  Arousal/Alertness: Awake/alert Behavior During Therapy: WFL for tasks assessed/performed Overall Cognitive Status: Within Functional Limits for tasks assessed                                        Exercises      General Comments        Pertinent Vitals/Pain Pain Assessment: 0-10 Pain Score: 5  Pain Location: R hip with walking; no pain at rest Pain Descriptors / Indicators: Aching;Operative site guarding Pain Intervention(s): Monitored during session    Home Living                      Prior Function            PT Goals (current goals can now be found in the care plan section) Acute Rehab PT Goals Patient Stated Goal: to go home today, to [facility] in May PT Goal Formulation: With patient Time For Goal Achievement: 09/30/20 Potential to Achieve Goals: Good Progress towards PT goals: Progressing toward goals    Frequency    7X/week      PT Plan Current plan remains appropriate    Co-evaluation  AM-PAC PT "6 Clicks" Mobility   Outcome Measure  Help needed turning from your back to your side while in a flat bed without using bedrails?: None Help needed moving from lying on your back to sitting on the side of a flat bed without using bedrails?: A Little Help needed moving to and from a bed to a chair (including a wheelchair)?: None Help needed standing up from a chair using your arms (e.g., wheelchair or bedside chair)?: None Help needed to walk in hospital room?: None Help needed climbing 3-5 steps with a railing? : A Little 6 Click Score: 22    End of Session   Activity Tolerance: Patient tolerated treatment well Patient left: in chair;with call bell/phone within reach;with family/visitor present (getting dressed) Nurse Communication: Mobility status PT Visit Diagnosis: Unsteadiness on feet (R26.81);Muscle weakness (generalized) (M62.81)     Time: 9150-5697 PT Time Calculation (min) (ACUTE ONLY): 25  min  Charges:  $Therapeutic Activity: 8-22 mins $Self Care/Home Management: 8-22                     Roney Marion, PT  Acute Rehabilitation Services Pager [phone] Office [phone]    [name] 09/17/2020, 2:50 PM

## 2020-09-17 NOTE — Progress Notes (Signed)
Physical Therapy Treatment Patient Details Name: Sean Gardner MRN: [id number] DOB: 08/02/1956 Today's Date: 09/17/2020    History of Present Illness Pt is a 64 y/o male s/p R THA, direct anterior. PMH inlcudes HTN, lung cancer, PVD and s/p CABG.     PT Comments    Continuing work on functional mobility and activity tolerance;  Session focused on therex, transfers, gait and stair training in prep for dc today; Overall managing quite well; Discussed car transfers; Questions solicited and answered; OK for dc home from PT standpoint    Follow Up Recommendations  Follow surgeon's recommendation for DC plan and follow-up therapies     Equipment Recommendations  Rolling walker with 5" wheels;3in1 (PT) (already delivered to room)    Recommendations for Other Services       Precautions / Restrictions Precautions Precautions: None Restrictions Weight Bearing Restrictions: Yes RLE Weight Bearing: Weight bearing as tolerated    Mobility  Bed Mobility Overal bed mobility: Needs Assistance Bed Mobility: Supine to Sit;Sit to Supine     Supine to sit: Min assist Sit to supine: Min assist   General bed mobility comments: Cues to preposition to make getting up easier, min handheld assist to pull to sit; Min assist to help lift RLE back inot bed; bed heaight elevated to approximate home  Transfers Overall transfer level: Needs assistance Equipment used: Rolling walker (2 wheeled) Transfers: Sit to/from Stand Sit to Stand: Supervision         General transfer comment: Supervision for safety; Able to stand from various seat heights; slow rise, but no physical assist needed  Ambulation/Gait Ambulation/Gait assistance: Min guard (without physical contact) Gait Distance (Feet): 150 Feet Assistive device: Rolling walker (2 wheeled) Gait Pattern/deviations: Step-through pattern     General Gait Details: Overall walking quite well; cues to self-monitor for activity tolerance;  adjusted hi RW for optimal height   Stairs Stairs: Yes Stairs assistance: Min guard Stair Management: No rails;One rail Right;Backwards;Sideways;Step to pattern (used R rail to approximate using heavy trash cans on R that he uses for support at home) Number of Stairs: 2 (x2) General stair comments: Demostrated a few backwards techniques to pt and he managed the first step up well, then teh second step was an uncomfortable hip angle for Sean Gardner; then went up and down steps sideways with R rail to simulate setup at home; he demonstrated sideways well, and tells me that is how he will do the stairs to enter his home   Wheelchair Mobility    Modified Rankin (Stroke Patients Only)       Balance     Sitting balance-Leahy Scale: Good       Standing balance-Leahy Scale: Fair                              Cognition Arousal/Alertness: Awake/alert Behavior During Therapy: WFL for tasks assessed/performed Overall Cognitive Status: Within Functional Limits for tasks assessed                                        Exercises Total Joint Exercises Ankle Circles/Pumps: AROM;Both;10 reps Quad Sets: AROM;Right;10 reps Gluteal Sets: AROM;Both;10 reps Towel Squeeze: AROM;Both;10 reps Heel Slides: AROM;Right;10 reps Hip ABduction/ADduction: AAROM;Right;10 reps    General Comments General comments (skin integrity, edema, etc.): Discussed the multiple uses of the 3in1, HEP, and car  transfers      Pertinent Vitals/Pain Pain Assessment: 0-10 Pain Score: 5  Pain Location: R hip with walking; no pain at rest Pain Descriptors / Indicators: Aching;Operative site guarding Pain Intervention(s): Monitored during session    Home Living                      Prior Function            PT Goals (current goals can now be found in the care plan section) Acute Rehab PT Goals Patient Stated Goal: to go home PT Goal Formulation: With patient Time For Goal  Achievement: 09/30/20 Potential to Achieve Goals: Good Progress towards PT goals: Progressing toward goals    Frequency    7X/week      PT Plan Current plan remains appropriate    Co-evaluation              AM-PAC PT "6 Clicks" Mobility   Outcome Measure  Help needed turning from your back to your side while in a flat bed without using bedrails?: A Little Help needed moving from lying on your back to sitting on the side of a flat bed without using bedrails?: A Little Help needed moving to and from a bed to a chair (including a wheelchair)?: None Help needed standing up from a chair using your arms (e.g., wheelchair or bedside chair)?: None Help needed to walk in hospital room?: None Help needed climbing 3-5 steps with a railing? : A Little 6 Click Score: 21    End of Session Equipment Utilized During Treatment: Gait belt Activity Tolerance: Patient tolerated treatment well Patient left: in bed;with call bell/phone within reach Nurse Communication: Mobility status PT Visit Diagnosis: Unsteadiness on feet (R26.81);Muscle weakness (generalized) (M62.81)     Time: 4076-8088 PT Time Calculation (min) (ACUTE ONLY): 48 min  Charges:  $Gait Training: 8-22 mins $Therapeutic Exercise: 8-22 mins $Therapeutic Activity: 8-22 mins                     Roney Marion, PT  Acute Rehabilitation Services Pager [phone] Office [place] 09/17/2020, 10:57 AM

## 2020-09-17 NOTE — Progress Notes (Signed)
Discharge package printed and instructions given to patient and wife. Verbalizes understanding.

## 2020-09-17 NOTE — TOC Initial Note (Signed)
Transition of Care [facility]) - Initial/Assessment Note    Patient Details  Name: Sean Gardner MRN: [id number] Date of Birth: [birth year]  Transition of Care [facility]) CM/SW Contact:    Curlene Labrum, RN Phone Number: 09/17/2020, 10:03 AM  Clinical Narrative:                 Case management met with the patient at the bedside S/P Right anterior hip replacement by Dr. Erlinda Hong.  The patient was given Medicare observation letter and verbalizes understanding.  The patient plans to discharge home with his wife today.  The patient was previously set up with [facility] for PT and has a 3:1 and RW present in the room.  No other TOC needs at this time and patient will be discharged home by primary RN today.  Expected Discharge Plan: [place] Barriers to Discharge: No Barriers Identified (Plans to see PT today for therapy and D/C this afternoon afterwards)   Patient Goals and CMS Choice Patient states their goals for this hospitalization and ongoing recovery are:: Plans to discharge home today with his wife. CMS Medicare.gov Compare Post Acute Care list provided to:: Patient Choice offered to / list presented to : Patient  Expected Discharge Plan and Services Expected Discharge Plan: [place]   Discharge Planning Services: CM Consult Post Acute Care Choice: Durable Medical Equipment, Home Health Living arrangements for the past 2 months: Single Family Home Expected Discharge Date: 09/17/20               DME Arranged: 3-N-1, Walker rolling DME Agency: [employer]     Representative spoke with at DME Agency: 3:1 and Rw are present in the room HH Arranged: PT [place] Agency: [facility] (now [facility]) Date [place]: 09/17/20 Time [place]: 37 Representative spoke with at [place]: Delton Coombes, RNCM  Prior Living Arrangements/Services Living arrangements for the past 2 months: [place] with::  Spouse Patient language and need for interpreter reviewed:: Yes Do you feel safe going back to the place where you live?: Yes      Need for Family Participation in Patient Care: Yes (Comment) Care giver support system in place?: Yes (comment)   Criminal Activity/Legal Involvement Pertinent to Current Situation/Hospitalization: No - Comment as needed  Activities of Daily Living Home Assistive Devices/Equipment: CPAP ADL Screening (condition at time of admission) Patient's cognitive ability adequate to safely complete daily activities?: Yes Is the patient deaf or have difficulty hearing?: No Does the patient have difficulty seeing, even when wearing glasses/contacts?: No Does the patient have difficulty concentrating, remembering, or making decisions?: No Patient able to express need for assistance with ADLs?: Yes Does the patient have difficulty dressing or bathing?: Yes Independently performs ADLs?: No Communication: Independent Dressing (OT): Needs assistance Is this a change from baseline?: Pre-admission baseline Grooming: Independent Feeding: Independent Bathing: Independent Toileting: Independent In/Out Bed: Independent Walks in Home: Independent Does the patient have difficulty walking or climbing stairs?: Yes Weakness of Legs: None Weakness of Arms/Hands: None  Permission Sought/Granted Permission sought to share information with : Case Manager Permission granted to share information with : Yes, Verbal Permission Granted     Permission granted to share info w AGENCY: [facility], [facility]  Permission granted to share info w Relationship: spouse - Lucinda     Emotional Assessment Appearance:: Appears stated age Attitude/Demeanor/Rapport: Gracious Affect (typically observed): Accepting Orientation: : Oriented to Self,  Oriented to Place, Oriented to  Time, Oriented to Situation Alcohol / Substance Use: Not Applicable Psych Involvement: No (comment)  Admission  diagnosis:  Status post total replacement of right hip [Z96.641] Patient Active Problem List   Diagnosis Date Noted  . Status post total replacement of right hip 09/16/2020  . Primary osteoarthritis of right hip 09/15/2020  . Hyperkalemia 01/10/2020  . Bone metastasis ([place]) 09/05/2018  . Sepsis secondary to UTI ([place]) 05/11/2016  . CAD (coronary artery disease) 05/11/2016  . Hyperlipidemia 05/11/2016  . Morbid (severe) obesity due to excess calories ([place]) 03/21/2016  . Sleep apnea 09/18/2015  . Dyspnea and respiratory abnormality 09/18/2015  . Dyspnea 10/11/2012  . Essential hypertension 10/11/2012  . Lung cancer ([place]) 03/23/2012   PCP:  [name], MD Pharmacy:   [id number] [place], [place] - [address] [place] [address]Phone: [phone]: [phone]    Social Determinants of Health (SDOH) Interventions    Readmission Risk Interventions No flowsheet data found.

## 2020-09-17 NOTE — Progress Notes (Signed)
Subjective: 1 Day Post-Op Procedure(s) (LRB): RIGHT TOTAL HIP ARTHROPLASTY ANTERIOR APPROACH (Right) Patient reports pain as mild.    Objective: Vital signs in last 24 hours: Temp:  [97.5 F (36.4 C)-98.8 F (37.1 C)] 98.8 F (37.1 C) (11/30 0258) Pulse Rate:  [59-89] 61 (11/30 0258) Resp:  [15-22] 20 (11/30 0258) BP: (101-143)/(58-72) 130/66 (11/30 0258) SpO2:  [95 %-100 %] 98 % (11/30 0258)  Intake/Output from previous day: 11/29 0701 - 11/30 0700 In: 976.1 [I.V.:676.2; IV Piggyback:299.9] Out: 1450 [Urine:1400; Blood:50] Intake/Output this shift: No intake/output data recorded.  Recent Labs    09/17/20 0347  HGB 12.0*   Recent Labs    09/17/20 0347  WBC 13.2*  RBC 3.67*  HCT 36.0*  PLT 202   Recent Labs    09/17/20 0347  NA 130*  K 4.4  CL 101  CO2 24  BUN 15  CREATININE 1.05  GLUCOSE 122*  CALCIUM 8.8*   No results for input(s): LABPT, INR in the last 72 hours.  Neurologically intact Neurovascular intact Sensation intact distally Intact pulses distally Dorsiflexion/Plantar flexion intact Incision: dressing C/D/I No cellulitis present Compartment soft   Assessment/Plan: 1 Day Post-Op Procedure(s) (LRB): RIGHT TOTAL HIP ARTHROPLASTY ANTERIOR APPROACH (Right) Advance diet Up with therapy D/C IV fluids Discharge home with home health after second PT session as long as he mobilizes well WBAT RLE ABLA- mild and stable      Aundra Dubin 09/17/2020, 8:00 AM

## 2020-09-17 NOTE — Discharge Summary (Signed)
Patient ID: Sean Gardner MRN: [id number] DOB/AGE: 01-25-1956 64 y.o.  Admit date: 09/16/2020 Discharge date: 09/17/2020  Admission Diagnoses:  Principal Problem:   Primary osteoarthritis of right hip Active Problems:   Status post total replacement of right hip   Discharge Diagnoses:  Same  Past Medical History:  Diagnosis Date  . Claudication in peripheral vascular disease ([place])   . Hyperlipidemia   . Hypertension   . Lung cancer ([place])    neuroendocrine lung ca dx 2010  . Myocardial infarction ([place]) 07/2000  . Peripheral vascular disease ([place])   . Shortness of breath    on exertion,can't lay on his back  . Sleep apnea    cpap    Surgeries: Procedure(s): RIGHT TOTAL HIP ARTHROPLASTY ANTERIOR APPROACH on 09/16/2020   Consultants:   Discharged Condition: Improved  Hospital Course: Sean Gardner is an 64 y.o. male who was admitted 09/16/2020 for operative treatment ofPrimary osteoarthritis of right hip. Patient has severe unremitting pain that affects sleep, daily activities, and work/hobbies. After pre-op clearance the patient was taken to the operating room on 09/16/2020 and underwent  Procedure(s): RIGHT TOTAL HIP ARTHROPLASTY ANTERIOR APPROACH.    Patient was given perioperative antibiotics:  Anti-infectives (From admission, onward)   Start     Dose/Rate Route Frequency Ordered Stop   09/16/20 1400  ceFAZolin (ANCEF) IVPB 2g/100 mL premix        2 g 200 mL/hr over 30 Minutes Intravenous Every 6 hours 09/16/20 1150 09/16/20 2222   09/16/20 0854  vancomycin (VANCOCIN) powder  Status:  Discontinued          As needed 09/16/20 0855 09/16/20 1002   09/16/20 0600  ceFAZolin (ANCEF) 3 g in dextrose 5 % 50 mL IVPB        3 g 100 mL/hr over 30 Minutes Intravenous On call to O.R. 09/13/20 0716 09/16/20 0800       Patient was given sequential compression devices, early ambulation, and chemoprophylaxis to prevent DVT.  Patient benefited maximally from hospital stay and  there were no complications.    Recent vital signs:  Patient Vitals for the past 24 hrs:  BP Temp Temp src Pulse Resp SpO2  09/17/20 0258 130/66 98.8 F (37.1 C) Oral 61 20 98 %  09/17/20 0001 133/72 98.5 F (36.9 C) Oral 71 18 100 %  09/16/20 2025 -- -- -- 75 16 96 %  09/16/20 1956 130/65 98.5 F (36.9 C) Oral 71 18 95 %  09/16/20 1712 (!) 143/66 98 F (36.7 C) Oral 84 18 100 %  09/16/20 1424 (!) 141/66 98 F (36.7 C) -- 89 18 100 %  09/16/20 1315 101/66 97.8 F (36.6 C) Oral 60 16 99 %  09/16/20 1156 138/67 (!) 97.5 F (36.4 C) Oral 62 16 97 %  09/16/20 1130 -- 98 F (36.7 C) -- -- -- --  09/16/20 1115 123/66 -- -- (!) 59 18 96 %  09/16/20 1100 117/69 -- -- 61 16 97 %  09/16/20 1045 (!) 119/58 -- -- 60 17 98 %  09/16/20 1030 124/65 -- -- 63 19 97 %  09/16/20 1015 130/69 -- -- 62 15 96 %  09/16/20 1000 112/67 98 F (36.7 C) -- 68 (!) 22 98 %     Recent laboratory studies:  Recent Labs    09/17/20 0347  WBC 13.2*  HGB 12.0*  HCT 36.0*  PLT 202  NA 130*  K 4.4  CL 101  CO2 24  BUN  15  CREATININE 1.05  GLUCOSE 122*  CALCIUM 8.8*     Discharge Medications:   Allergies as of 09/17/2020      Reactions   Lisinopril Other (See Comments)   Hyperkalemia   Zolpidem Tartrate Other (See Comments)   Felt fuzzy and hallucinations      Medication List    STOP taking these medications   diphenhydramine-acetaminophen 25-500 MG Tabs tablet Commonly known as: TYLENOL PM     TAKE these medications   amLODipine 5 MG tablet Commonly known as: NORVASC Take 1 tablet (5 mg total) by mouth daily. What changed: when to take this   aspirin EC 325 MG tablet Take 1 tablet (325 mg total) by mouth daily. Take one table once daily (instead of aspirin 81mg ) AFTER surgery to prevent blood clots What changed: Another medication with the same name was removed. Continue taking this medication, and follow the directions you see here.   clopidogrel 75 MG tablet Commonly known as:  PLAVIX Take 1 tablet (75 mg total) by mouth daily before breakfast.   docusate sodium 100 MG capsule Commonly known as: Colace Take 1 capsule (100 mg total) by mouth daily as needed.   ezetimibe 10 MG tablet Commonly known as: ZETIA Take 10 mg by mouth daily.   hydrALAZINE 25 MG tablet Commonly known as: APRESOLINE TAKE 1 TABLET BY MOUTH THREE TIMES A DAY   methocarbamol 500 MG tablet Commonly known as: Robaxin Take 1 tablet (500 mg total) by mouth 2 (two) times daily as needed. To take post-op as needed for pain   metoprolol tartrate 50 MG tablet Commonly known as: LOPRESSOR Take 50 mg by mouth 2 (two) times daily. Hold if systolic blood pressure (top blood pressure number) less than 100 mmHg or heart rate less than 60 bpm (pulse).   omeprazole 20 MG capsule Commonly known as: PRILOSEC Take 20 mg by mouth daily.   ondansetron 4 MG tablet Commonly known as: Zofran Take 1 tablet (4 mg total) by mouth every 8 (eight) hours as needed for nausea or vomiting.   oxyCODONE-acetaminophen 5-325 MG tablet Commonly known as: Percocet Take 1-2 tablets by mouth every 6 (six) hours as needed. To take post-op as needed for pain   Repatha SureClick 299 MG/ML [place] Generic drug: Evolocumab Inject 140 mg into the skin every 14 (fourteen) days.   rosuvastatin 40 MG tablet Commonly known as: CRESTOR Take 40 mg by mouth daily.   Symbicort 160-4.5 MCG/ACT inhaler Generic drug: budesonide-formoterol Inhale 2 puffs into the lungs daily as needed (Shortness of breath).   Vitamin D (Ergocalciferol) 1.25 MG (50000 UNIT) Caps capsule Commonly known as: DRISDOL Take 50,000 Units by mouth every Saturday.            Durable Medical Equipment  (From admission, onward)         Start     Ordered   09/16/20 1151  DME Walker rolling  Once       Question:  Patient needs a walker to treat with the following condition  Answer:  History of hip replacement   09/16/20 1150   09/16/20 1151  DME  3 n 1  Once        09/16/20 1150   09/16/20 1151  DME Bedside commode  Once       Question:  Patient needs a bedside commode to treat with the following condition  Answer:  History of hip replacement   09/16/20 1150  Diagnostic Studies: DG Chest 2 View  Result Date: 09/06/2020 CLINICAL DATA:  Pre-admission for right hip replacement. No current chest complaints. EXAM: CHEST - 2 VIEW COMPARISON:  08/09/2018 FINDINGS: Cardiac silhouette top-normal in size. Stable changes from prior CABG surgery. No mediastinal or hilar masses. No evidence of adenopathy. Chronic pleural thickening at the left lung base. Lungs mildly hyperexpanded, but clear. No pleural effusion or pneumothorax. Skeletal structures are intact. IMPRESSION: No active cardiopulmonary disease. Electronically Signed   By: Lajean Manes M.D.   On: 09/06/2020 10:04   DG Pelvis Portable  Result Date: 09/16/2020 CLINICAL DATA:  Postop for right hip arthroplasty. EXAM: PORTABLE PELVIS 1-2 VIEWS COMPARISON:  Intraoperative images of earlier today. FINDINGS: Right hip arthroplasty. No acute hardware complication or periprosthetic fracture. Left femoral head is located with mild degenerative changes. IMPRESSION: Expected appearance after right hip arthroplasty. Electronically Signed   By: Abigail Miyamoto M.D.   On: 09/16/2020 10:59   DG C-Arm 1-60 Min  Result Date: 09/16/2020 CLINICAL DATA:  Status post right hip replacement. EXAM: OPERATIVE right HIP (WITH PELVIS IF PERFORMED) 4 VIEWS TECHNIQUE: Fluoroscopic spot image(s) were submitted for interpretation post-operatively. Radiation exposure index: 6.4418 mGy. COMPARISON:  None. FINDINGS: Four intraoperative fluoroscopic images were obtained of the right hip. The right femoral and acetabular components appear to be well situated. IMPRESSION: Status post right total hip arthroplasty. Electronically Signed   By: Marijo Conception M.D.   On: 09/16/2020 09:52   DG HIP OPERATIVE UNILAT W OR  W/O PELVIS RIGHT  Result Date: 09/16/2020 CLINICAL DATA:  Status post right hip replacement. EXAM: OPERATIVE right HIP (WITH PELVIS IF PERFORMED) 4 VIEWS TECHNIQUE: Fluoroscopic spot image(s) were submitted for interpretation post-operatively. Radiation exposure index: 6.4418 mGy. COMPARISON:  None. FINDINGS: Four intraoperative fluoroscopic images were obtained of the right hip. The right femoral and acetabular components appear to be well situated. IMPRESSION: Status post right total hip arthroplasty. Electronically Signed   By: Marijo Conception M.D.   On: 09/16/2020 09:52    Disposition: Discharge disposition: 01-Home or Self Care          Follow-up Information    Leandrew Koyanagi, MD. Go on 10/01/2020.   Specialty: Orthopedic Surgery Why: at 10:00 am for your 2 week post-op appoinment in our office Contact information: [place] [address] [phone]        [facility] Follow up.   Specialty: [place] Why: Someone from the [place] agency will be in contact with you after discharge to arrange your first in home physical therapy appointment. Contact information: [address] [place] [address] [phone]                Signed: Aundra Dubin 09/17/2020, 8:01 AM

## 2020-09-17 NOTE — Care Management Obs Status (Signed)
[place] NOTIFICATION   Patient Details  Name: MCKADE GURKA MRN: [id number] Date of Birth: [birth year]   Medicare Observation Status Notification Given:  Yes    Curlene Labrum, RN 09/17/2020, 9:50 AM

## 2020-09-17 NOTE — Plan of Care (Signed)
  Problem: Activity: Goal: Risk for activity intolerance will decrease Outcome: Progressing   Problem: Clinical Measurements: Goal: Ability to maintain clinical measurements within normal limits will improve Outcome: Progressing   Problem: Nutrition: Goal: Adequate nutrition will be maintained Outcome: Progressing   Problem: Elimination: Goal: Will not experience complications related to bowel motility Outcome: Progressing Goal: Will not experience complications related to urinary retention Outcome: Progressing   Problem: Pain Managment: Goal: General experience of comfort will improve Outcome: Progressing

## 2020-09-18 ENCOUNTER — Telehealth: Payer: Self-pay | Admitting: *Deleted

## 2020-09-18 NOTE — Telephone Encounter (Signed)
Ortho bundle D/C call com;leted.

## 2020-09-19 DIAGNOSIS — Z96641 Presence of right artificial hip joint: Secondary | ICD-10-CM | POA: Diagnosis not present

## 2020-09-19 DIAGNOSIS — E785 Hyperlipidemia, unspecified: Secondary | ICD-10-CM | POA: Diagnosis not present

## 2020-09-19 DIAGNOSIS — G4730 Sleep apnea, unspecified: Secondary | ICD-10-CM | POA: Diagnosis not present

## 2020-09-19 DIAGNOSIS — Z951 Presence of aortocoronary bypass graft: Secondary | ICD-10-CM | POA: Diagnosis not present

## 2020-09-19 DIAGNOSIS — Z7982 Long term (current) use of aspirin: Secondary | ICD-10-CM | POA: Diagnosis not present

## 2020-09-19 DIAGNOSIS — Z7901 Long term (current) use of anticoagulants: Secondary | ICD-10-CM | POA: Diagnosis not present

## 2020-09-19 DIAGNOSIS — Z87891 Personal history of nicotine dependence: Secondary | ICD-10-CM | POA: Diagnosis not present

## 2020-09-19 DIAGNOSIS — Z85830 Personal history of malignant neoplasm of bone: Secondary | ICD-10-CM | POA: Diagnosis not present

## 2020-09-19 DIAGNOSIS — Z471 Aftercare following joint replacement surgery: Secondary | ICD-10-CM | POA: Diagnosis not present

## 2020-09-19 DIAGNOSIS — Z7951 Long term (current) use of inhaled steroids: Secondary | ICD-10-CM | POA: Diagnosis not present

## 2020-09-19 DIAGNOSIS — I10 Essential (primary) hypertension: Secondary | ICD-10-CM | POA: Diagnosis not present

## 2020-09-19 DIAGNOSIS — Z85118 Personal history of other malignant neoplasm of bronchus and lung: Secondary | ICD-10-CM | POA: Diagnosis not present

## 2020-09-19 DIAGNOSIS — I252 Old myocardial infarction: Secondary | ICD-10-CM | POA: Diagnosis not present

## 2020-09-19 DIAGNOSIS — I2510 Atherosclerotic heart disease of native coronary artery without angina pectoris: Secondary | ICD-10-CM | POA: Diagnosis not present

## 2020-09-19 DIAGNOSIS — I739 Peripheral vascular disease, unspecified: Secondary | ICD-10-CM | POA: Diagnosis not present

## 2020-09-20 DIAGNOSIS — R5381 Other malaise: Secondary | ICD-10-CM | POA: Diagnosis not present

## 2020-09-20 DIAGNOSIS — Z96641 Presence of right artificial hip joint: Secondary | ICD-10-CM | POA: Diagnosis not present

## 2020-09-20 DIAGNOSIS — K5903 Drug induced constipation: Secondary | ICD-10-CM | POA: Diagnosis not present

## 2020-09-21 DIAGNOSIS — I10 Essential (primary) hypertension: Secondary | ICD-10-CM | POA: Diagnosis not present

## 2020-09-21 DIAGNOSIS — I252 Old myocardial infarction: Secondary | ICD-10-CM | POA: Diagnosis not present

## 2020-09-21 DIAGNOSIS — G4730 Sleep apnea, unspecified: Secondary | ICD-10-CM | POA: Diagnosis not present

## 2020-09-21 DIAGNOSIS — I2510 Atherosclerotic heart disease of native coronary artery without angina pectoris: Secondary | ICD-10-CM | POA: Diagnosis not present

## 2020-09-21 DIAGNOSIS — I739 Peripheral vascular disease, unspecified: Secondary | ICD-10-CM | POA: Diagnosis not present

## 2020-09-21 DIAGNOSIS — Z471 Aftercare following joint replacement surgery: Secondary | ICD-10-CM | POA: Diagnosis not present

## 2020-09-24 DIAGNOSIS — I252 Old myocardial infarction: Secondary | ICD-10-CM | POA: Diagnosis not present

## 2020-09-24 DIAGNOSIS — G4730 Sleep apnea, unspecified: Secondary | ICD-10-CM | POA: Diagnosis not present

## 2020-09-24 DIAGNOSIS — I739 Peripheral vascular disease, unspecified: Secondary | ICD-10-CM | POA: Diagnosis not present

## 2020-09-24 DIAGNOSIS — I2510 Atherosclerotic heart disease of native coronary artery without angina pectoris: Secondary | ICD-10-CM | POA: Diagnosis not present

## 2020-09-24 DIAGNOSIS — I10 Essential (primary) hypertension: Secondary | ICD-10-CM | POA: Diagnosis not present

## 2020-09-24 DIAGNOSIS — Z471 Aftercare following joint replacement surgery: Secondary | ICD-10-CM | POA: Diagnosis not present

## 2020-09-25 ENCOUNTER — Telehealth: Payer: Self-pay | Admitting: *Deleted

## 2020-09-25 ENCOUNTER — Other Ambulatory Visit: Payer: Self-pay | Admitting: *Deleted

## 2020-09-25 ENCOUNTER — Telehealth: Payer: Self-pay | Admitting: Orthopaedic Surgery

## 2020-09-25 DIAGNOSIS — Z96641 Presence of right artificial hip joint: Secondary | ICD-10-CM

## 2020-09-25 MED ORDER — METHOCARBAMOL 750 MG PO TABS: 750.0000 mg | ORAL_TABLET | Freq: Two times a day (BID) | ORAL | 3 refills | Status: DC | PRN

## 2020-09-25 MED ORDER — OXYCODONE-ACETAMINOPHEN 5-325 MG PO TABS
1.0000 | ORAL_TABLET | Freq: Two times a day (BID) | ORAL | 0 refills | Status: DC | PRN
Start: 2020-09-25 — End: 2020-11-18

## 2020-09-25 NOTE — Telephone Encounter (Signed)
Call to patient and his wife informing of Lower extremity doppler U/S tomorrow with [facility] at 11:00 am. Be there by 10:45. Patient verbalized understanding.

## 2020-09-25 NOTE — Telephone Encounter (Signed)
Pt called about his pain levels being so high and is this normal and about his pain medication.

## 2020-09-25 NOTE — Telephone Encounter (Signed)
Patient requesting refill of Robaxin and Percocet. Also discussed pain in lower surgical extremity. Will order Doppler U/S as discussed. Tnx.

## 2020-09-25 NOTE — Telephone Encounter (Signed)
Refilled

## 2020-09-25 NOTE — Care Plan (Signed)
RNCM call to patient to check status at 7 days post-op. He states he is in a lot of pain. C/o pain in shins, around knee and down into calf muscle. States he can barely sit with his left on the recliner as the calf is very painful. Discussed with Dr. Erlinda Hong and will do a stat doppler U/S to r/o DVT. Also patient reviewed medications he is taking. Explained again, the purpose of each med and he requested refills of pain medication and muscle relaxer. Message sent to MD. CM will order U/S for MD at his request.

## 2020-09-26 ENCOUNTER — Ambulatory Visit (HOSPITAL_COMMUNITY)
Admission: RE | Admit: 2020-09-26 | Discharge: 2020-09-26 | Disposition: A | Discharge status: Home / Self Care | Payer: Medicare Other | Source: Ambulatory Visit | Attending: Orthopaedic Surgery | Admitting: Orthopaedic Surgery

## 2020-09-26 ENCOUNTER — Other Ambulatory Visit: Payer: Self-pay

## 2020-09-26 DIAGNOSIS — Z96641 Presence of right artificial hip joint: Secondary | ICD-10-CM | POA: Diagnosis not present

## 2020-09-27 DIAGNOSIS — I10 Essential (primary) hypertension: Secondary | ICD-10-CM | POA: Diagnosis not present

## 2020-09-27 DIAGNOSIS — I2510 Atherosclerotic heart disease of native coronary artery without angina pectoris: Secondary | ICD-10-CM | POA: Diagnosis not present

## 2020-09-27 DIAGNOSIS — I739 Peripheral vascular disease, unspecified: Secondary | ICD-10-CM | POA: Diagnosis not present

## 2020-09-27 DIAGNOSIS — Z471 Aftercare following joint replacement surgery: Secondary | ICD-10-CM | POA: Diagnosis not present

## 2020-09-27 DIAGNOSIS — I252 Old myocardial infarction: Secondary | ICD-10-CM | POA: Diagnosis not present

## 2020-09-27 DIAGNOSIS — G4730 Sleep apnea, unspecified: Secondary | ICD-10-CM | POA: Diagnosis not present

## 2020-09-29 DIAGNOSIS — I252 Old myocardial infarction: Secondary | ICD-10-CM | POA: Diagnosis not present

## 2020-09-29 DIAGNOSIS — I2510 Atherosclerotic heart disease of native coronary artery without angina pectoris: Secondary | ICD-10-CM | POA: Diagnosis not present

## 2020-09-29 DIAGNOSIS — G4730 Sleep apnea, unspecified: Secondary | ICD-10-CM | POA: Diagnosis not present

## 2020-09-29 DIAGNOSIS — I10 Essential (primary) hypertension: Secondary | ICD-10-CM | POA: Diagnosis not present

## 2020-09-29 DIAGNOSIS — I739 Peripheral vascular disease, unspecified: Secondary | ICD-10-CM | POA: Diagnosis not present

## 2020-09-29 DIAGNOSIS — Z471 Aftercare following joint replacement surgery: Secondary | ICD-10-CM | POA: Diagnosis not present

## 2020-10-01 ENCOUNTER — Ambulatory Visit (INDEPENDENT_AMBULATORY_CARE_PROVIDER_SITE_OTHER): Payer: Medicare Other | Admitting: Physician Assistant

## 2020-10-01 ENCOUNTER — Telehealth: Payer: Self-pay | Admitting: *Deleted

## 2020-10-01 ENCOUNTER — Other Ambulatory Visit: Payer: Self-pay

## 2020-10-01 DIAGNOSIS — Z96641 Presence of right artificial hip joint: Secondary | ICD-10-CM

## 2020-10-01 MED ORDER — HYDROCODONE-ACETAMINOPHEN 5-325 MG PO TABS
1.0000 | ORAL_TABLET | Freq: Three times a day (TID) | ORAL | 0 refills | Status: DC | PRN
Start: 2020-10-01 — End: 2020-12-03

## 2020-10-01 NOTE — Progress Notes (Signed)
Post-Op Visit Note   Patient: Sean Gardner           Date of Birth: Aug 24, 1956           MRN: [id number] Visit Date: 10/01/2020 PCP: [name], MD   Assessment & Plan:  Chief Complaint:  Chief Complaint  Patient presents with  . Right Hip - Routine Post Op   Visit Diagnoses:  1. Status post total hip replacement, right     Plan: Patient is a very pleasant 64 year old gentleman who comes in today 2 weeks out right total hip replacement.  He has been doing well.  No complaints.  His home health physical therapy ended this past Sunday.  He is ambulating with a walker.  Examination of his right hip reveals a well-healed surgical incision with nylon sutures in place.  No evidence of infection or cellulitis.  Calves are soft nontender.  He is neurovascular intact distally.  Today, sutures were removed and Steri-Strips applied.  I have refilled his Norco.  He has finished home health physical therap and has agreed to attend a few outpatient physical therapy sessions.  I have provided the patient with a paper prescription as they would like to attend a location closer to where they live.  He will follow up with [name] in 4 weeks time for repeat evaluation and AP pelvis x-rays.  Dental prophylaxis reinforced.  Follow-Up Instructions: Return in about 4 weeks (around 10/29/2020).   Orders:  No orders of the defined types were placed in this encounter.  Meds ordered this encounter  Medications  . HYDROcodone-acetaminophen (NORCO) 5-325 MG tablet    Sig: Take 1-2 tablets by mouth 3 (three) times daily as needed.    Dispense:  30 tablet    Refill:  0    Imaging: No new imaging  PMFS History: Patient Active Problem List   Diagnosis Date Noted  . Status post total replacement of right hip 09/16/2020  . Primary osteoarthritis of right hip 09/15/2020  . Hyperkalemia 01/10/2020  . Bone metastasis ([place]) 09/05/2018  . Sepsis secondary to UTI ([place]) 05/11/2016  . CAD (coronary artery  disease) 05/11/2016  . Hyperlipidemia 05/11/2016  . Morbid (severe) obesity due to excess calories ([place]) 03/21/2016  . Sleep apnea 09/18/2015  . Dyspnea and respiratory abnormality 09/18/2015  . Dyspnea 10/11/2012  . Essential hypertension 10/11/2012  . Lung cancer ([place]) 03/23/2012   Past Medical History:  Diagnosis Date  . Claudication in peripheral vascular disease ([place])   . Hyperlipidemia   . Hypertension   . Lung cancer ([place])    neuroendocrine lung ca dx 2010  . Myocardial infarction ([place]) 07/2000  . Peripheral vascular disease ([place])   . Shortness of breath    on exertion,can't lay on his back  . Sleep apnea    cpap    Family History  Problem Relation Age of Onset  . Hypertension Mother   . Lung cancer Father     Past Surgical History:  Procedure Laterality Date  . ACHILLES TENDON SURGERY    . CORONARY ARTERY BYPASS GRAFT  08/18/2000   LIMA--dLIMA, RIMA-dRCA, left RA-OM2, SVG-D2  . FLEXIBLE SIGMOIDOSCOPY N/A 01/18/2013   Procedure: FLEXIBLE SIGMOIDOSCOPY;  Surgeon: Arta Silence, MD;  Location: [facility];  Service: Endoscopy;  Laterality: N/A;  . HERNIA REPAIR     inguinal  . HOT HEMOSTASIS N/A 01/18/2013   Procedure: HOT HEMOSTASIS (ARGON PLASMA COAGULATION/BICAP);  Surgeon: Arta Silence, MD;  Location: [facility];  Service: Endoscopy;  Laterality: N/A;  . LUNG SURGERY Left 01/29/2009   left lower lobectomy   . TOTAL HIP ARTHROPLASTY Right 09/16/2020   Procedure: RIGHT TOTAL HIP ARTHROPLASTY ANTERIOR APPROACH;  Surgeon: Leandrew Koyanagi, MD;  Location: [facility];  Service: Orthopedics;  Laterality: Right;   Social History   Occupational History  . Not on file  Tobacco Use  . Smoking status: Former Smoker    Packs/day: 2.00    Years: 20.00    Pack years: 40.00    Types: Cigarettes    Quit date: 10/19/2008    Years since quitting: 11.9  . Smokeless tobacco: Never Used  Vaping Use  . Vaping Use: Never used  Substance and Sexual Activity  . Alcohol use: Yes     Comment: occ  . Drug use: No  . Sexual activity: Not on file

## 2020-10-01 NOTE — Telephone Encounter (Signed)
Ortho bundle 14 day call completed. 

## 2020-10-03 ENCOUNTER — Telehealth: Payer: Self-pay | Admitting: *Deleted

## 2020-10-03 NOTE — Telephone Encounter (Signed)
Patient called me again stating his pain is severe and constant in the lower surgical leg. He states his knee is painful, he feels like he has shin splints, and the ankle is now painful and the foot wants to turn outward. We discussed mechanics of the hip surgery again. He describes as sharp/electric type pain. Sleeping only 2 hours per night, but napping during the day. I recommended no naps during the day to help sleep at night and melatonin or maybe Tylenol PM. Any recommendations about other pain in lower extremity?

## 2020-10-03 NOTE — Telephone Encounter (Signed)
He may need xrays of his knee and possibly a cortisone shot.  Does he want to come in to be evaluated?

## 2020-10-04 NOTE — Telephone Encounter (Signed)
Patient scheduled for next week on Tuesday to see you.

## 2020-10-07 DIAGNOSIS — M25651 Stiffness of right hip, not elsewhere classified: Secondary | ICD-10-CM | POA: Diagnosis not present

## 2020-10-07 DIAGNOSIS — Z96641 Presence of right artificial hip joint: Secondary | ICD-10-CM | POA: Diagnosis not present

## 2020-10-07 DIAGNOSIS — M25551 Pain in right hip: Secondary | ICD-10-CM | POA: Diagnosis not present

## 2020-10-07 DIAGNOSIS — M6281 Muscle weakness (generalized): Secondary | ICD-10-CM | POA: Diagnosis not present

## 2020-10-08 ENCOUNTER — Ambulatory Visit (INDEPENDENT_AMBULATORY_CARE_PROVIDER_SITE_OTHER): Payer: Medicare Other | Admitting: Orthopaedic Surgery

## 2020-10-08 ENCOUNTER — Ambulatory Visit: Payer: Self-pay

## 2020-10-08 ENCOUNTER — Ambulatory Visit (INDEPENDENT_AMBULATORY_CARE_PROVIDER_SITE_OTHER): Payer: Medicare Other

## 2020-10-08 ENCOUNTER — Other Ambulatory Visit: Payer: Self-pay

## 2020-10-08 DIAGNOSIS — I2510 Atherosclerotic heart disease of native coronary artery without angina pectoris: Secondary | ICD-10-CM

## 2020-10-08 DIAGNOSIS — M25561 Pain in right knee: Secondary | ICD-10-CM

## 2020-10-08 DIAGNOSIS — M79671 Pain in right foot: Secondary | ICD-10-CM

## 2020-10-08 MED ORDER — TRAZODONE HCL 50 MG PO TABS: 50.0000 mg | ORAL_TABLET | Freq: Every day | ORAL | 0 refills | Status: DC

## 2020-10-08 NOTE — Progress Notes (Signed)
Office Visit Note   Patient: Sean Gardner           Date of Birth: [birth year]           MRN: [id number] Visit Date: 10/08/2020              Requested by: [name], MD [place],  [place] [id number] PCP: [name], MD   Assessment & Plan: Visit Diagnoses:  1. Acute pain of right knee   2. Pain of right heel     Plan: Impression is right knee pain and exacerbation of right distal Achilles tendinopathy.  I have recommended Voltaren gel for both.  Sounds are overall his symptoms are worst at night and he would like something to help him sleep.  Trazodone was prescribed.  Patient declined cortisone injection of the right knee.  Follow-up for the hip as scheduled.  Follow-Up Instructions: Return for As scheduled.   Orders:  Orders Placed This Encounter  Procedures  . XR Ankle Complete Right  . XR KNEE 3 VIEW RIGHT   Meds ordered this encounter  Medications  . traZODone (DESYREL) 50 MG tablet    Sig: Take 1 tablet (50 mg total) by mouth at bedtime.    Dispense:  20 tablet    Refill:  0      Procedures: No procedures performed   Clinical Data: No additional findings.   Subjective: Chief Complaint  Patient presents with  . Right Knee - Pain    Sean Gardner is status post right total hip replacement.  His hip is doing well.  He is complaining of anterior knee pain and posterior heel pain.  This is worse with activity and exertion.  The knee pain has gotten somewhat better.   Review of Systems   Objective: Vital Signs: There were no vitals taken for this visit.  Physical Exam  Ortho Exam Right knee shows no joint effusion.  Good range of motion with minimal pain. Right heel shows tenderness to the distal insertion of the Achilles.  Motor and sensory function intact.  Specialty Comments:  No specialty comments available.  Imaging: XR Ankle Complete Right  Result Date: 10/08/2020 Mild to moderate arthritis of the ankle joint.   Large calcification of the distal Achilles consistent with chronic Achilles tendinopathy.  XR KNEE 3 VIEW RIGHT  Result Date: 10/08/2020 Mild osteoarthritis.    PMFS History: Patient Active Problem List   Diagnosis Date Noted  . Status post total replacement of right hip 09/16/2020  . Primary osteoarthritis of right hip 09/15/2020  . Hyperkalemia 01/10/2020  . Bone metastasis ([place]) 09/05/2018  . Sepsis secondary to UTI ([place]) 05/11/2016  . CAD (coronary artery disease) 05/11/2016  . Hyperlipidemia 05/11/2016  . Morbid (severe) obesity due to excess calories ([place]) 03/21/2016  . Sleep apnea 09/18/2015  . Dyspnea and respiratory abnormality 09/18/2015  . Dyspnea 10/11/2012  . Essential hypertension 10/11/2012  . Lung cancer ([place]) 03/23/2012   Past Medical History:  Diagnosis Date  . Claudication in peripheral vascular disease ([place])   . Hyperlipidemia   . Hypertension   . Lung cancer ([place])    neuroendocrine lung ca dx 2010  . Myocardial infarction ([place]) 07/2000  . Peripheral vascular disease ([place])   . Shortness of breath    on exertion,can't lay on his back  . Sleep apnea    cpap    Family History  Problem Relation Age of Onset  . Hypertension Mother   .  Lung cancer Father     Past Surgical History:  Procedure Laterality Date  . ACHILLES TENDON SURGERY    . CORONARY ARTERY BYPASS GRAFT  08/18/2000   LIMA--dLIMA, RIMA-dRCA, left RA-OM2, SVG-D2  . FLEXIBLE SIGMOIDOSCOPY N/A 01/18/2013   Procedure: FLEXIBLE SIGMOIDOSCOPY;  Surgeon: Arta Silence, MD;  Location: [facility];  Service: Endoscopy;  Laterality: N/A;  . HERNIA REPAIR     inguinal  . HOT HEMOSTASIS N/A 01/18/2013   Procedure: HOT HEMOSTASIS (ARGON PLASMA COAGULATION/BICAP);  Surgeon: Arta Silence, MD;  Location: [facility];  Service: Endoscopy;  Laterality: N/A;  . LUNG SURGERY Left 01/29/2009   left lower lobectomy   . TOTAL HIP ARTHROPLASTY Right 09/16/2020   Procedure: RIGHT TOTAL HIP ARTHROPLASTY  ANTERIOR APPROACH;  Surgeon: Leandrew Koyanagi, MD;  Location: [facility];  Service: Orthopedics;  Laterality: Right;   Social History   Occupational History  . Not on file  Tobacco Use  . Smoking status: Former Smoker    Packs/day: 2.00    Years: 20.00    Pack years: 40.00    Types: Cigarettes    Quit date: 10/19/2008    Years since quitting: 11.9  . Smokeless tobacco: Never Used  Vaping Use  . Vaping Use: Never used  Substance and Sexual Activity  . Alcohol use: Yes    Comment: occ  . Drug use: No  . Sexual activity: Not on file

## 2020-10-14 DIAGNOSIS — M6281 Muscle weakness (generalized): Secondary | ICD-10-CM | POA: Diagnosis not present

## 2020-10-14 DIAGNOSIS — M25551 Pain in right hip: Secondary | ICD-10-CM | POA: Diagnosis not present

## 2020-10-14 DIAGNOSIS — Z96641 Presence of right artificial hip joint: Secondary | ICD-10-CM | POA: Diagnosis not present

## 2020-10-14 DIAGNOSIS — M25651 Stiffness of right hip, not elsewhere classified: Secondary | ICD-10-CM | POA: Diagnosis not present

## 2020-10-16 DIAGNOSIS — M6281 Muscle weakness (generalized): Secondary | ICD-10-CM | POA: Diagnosis not present

## 2020-10-16 DIAGNOSIS — Z96641 Presence of right artificial hip joint: Secondary | ICD-10-CM | POA: Diagnosis not present

## 2020-10-16 DIAGNOSIS — M25651 Stiffness of right hip, not elsewhere classified: Secondary | ICD-10-CM | POA: Diagnosis not present

## 2020-10-16 DIAGNOSIS — M25551 Pain in right hip: Secondary | ICD-10-CM | POA: Diagnosis not present

## 2020-10-17 ENCOUNTER — Telehealth: Payer: Self-pay | Admitting: *Deleted

## 2020-10-17 NOTE — Telephone Encounter (Signed)
Ortho bundle 30 day call completed. °

## 2020-11-01 ENCOUNTER — Ambulatory Visit (INDEPENDENT_AMBULATORY_CARE_PROVIDER_SITE_OTHER): Payer: Medicare Other | Admitting: Orthopaedic Surgery

## 2020-11-01 ENCOUNTER — Ambulatory Visit (INDEPENDENT_AMBULATORY_CARE_PROVIDER_SITE_OTHER): Payer: Medicare Other

## 2020-11-01 ENCOUNTER — Encounter: Payer: Self-pay | Admitting: Orthopaedic Surgery

## 2020-11-01 VITALS — Ht 72.0 in | Wt 281.0 lb

## 2020-11-01 DIAGNOSIS — Z96641 Presence of right artificial hip joint: Secondary | ICD-10-CM | POA: Diagnosis not present

## 2020-11-01 NOTE — Progress Notes (Signed)
Post-Op Visit Note   Patient: Sean Gardner           Date of Birth: September 21, 1956           MRN: [id number] Visit Date: 11/01/2020 PCP: [name], MD   Assessment & Plan:  Chief Complaint:  Chief Complaint  Patient presents with  . Right Hip - Follow-up    Right total hip arthroplasty 09/16/2020   Visit Diagnoses:  1. Status post total hip replacement, right     Plan: Patient is a pleasant 65 year old gentleman who comes in today 6 weeks out right anterior total hip replacement.  He is doing well.  He has been in physical therapy but has missed the past 2 weeks due to issues with power outages.  He is continuing to work on a home exercise program.  He is ambulating without assistance.  He does note that his knee and Achilles have significantly improved.  Examination of his right hip reveals full hip flexion.  He is neurovascular intact distally.  At this point, he will continue with his home exercise program.  He will follow-up with [name] in 6 weeks time for recheck.  Dental prophylaxis reinforced.  Follow-Up Instructions: Return in about 6 weeks (around 12/13/2020).   Orders:  Orders Placed This Encounter  Procedures  . XR Pelvis 1-2 Views   No orders of the defined types were placed in this encounter.   Imaging: XR Pelvis 1-2 Views  Result Date: 11/01/2020 Well-seated prosthesis without complication   PMFS History: Patient Active Problem List   Diagnosis Date Noted  . Status post total replacement of right hip 09/16/2020  . Primary osteoarthritis of right hip 09/15/2020  . Hyperkalemia 01/10/2020  . Bone metastasis ([place]) 09/05/2018  . Sepsis secondary to UTI ([place]) 05/11/2016  . CAD (coronary artery disease) 05/11/2016  . Hyperlipidemia 05/11/2016  . Morbid (severe) obesity due to excess calories ([place]) 03/21/2016  . Sleep apnea 09/18/2015  . Dyspnea and respiratory abnormality 09/18/2015  . Dyspnea 10/11/2012  . Essential hypertension 10/11/2012  . Lung cancer  ([place]) 03/23/2012   Past Medical History:  Diagnosis Date  . Claudication in peripheral vascular disease ([place])   . Hyperlipidemia   . Hypertension   . Lung cancer ([place])    neuroendocrine lung ca dx 2010  . Myocardial infarction ([place]) 07/2000  . Peripheral vascular disease ([place])   . Shortness of breath    on exertion,can't lay on his back  . Sleep apnea    cpap    Family History  Problem Relation Age of Onset  . Hypertension Mother   . Lung cancer Father     Past Surgical History:  Procedure Laterality Date  . ACHILLES TENDON SURGERY    . CORONARY ARTERY BYPASS GRAFT  08/18/2000   LIMA--dLIMA, RIMA-dRCA, left RA-OM2, SVG-D2  . FLEXIBLE SIGMOIDOSCOPY N/A 01/18/2013   Procedure: FLEXIBLE SIGMOIDOSCOPY;  Surgeon: Arta Silence, MD;  [facility];  Service: Endoscopy;  Laterality: N/A;  . HERNIA REPAIR     inguinal  . HOT HEMOSTASIS N/A 01/18/2013   Procedure: HOT HEMOSTASIS (ARGON PLASMA COAGULATION/BICAP);  Surgeon: Arta Silence, MD;  Location: [facility];  Service: Endoscopy;  Laterality: N/A;  . LUNG SURGERY Left 01/29/2009   left lower lobectomy   . TOTAL HIP ARTHROPLASTY Right 09/16/2020   Procedure: RIGHT TOTAL HIP ARTHROPLASTY ANTERIOR APPROACH;  Surgeon: Leandrew Koyanagi, MD;  Location: [facility];  Service: Orthopedics;  Laterality: Right;   Social History   Occupational  History  . Not on file  Tobacco Use  . Smoking status: Former Smoker    Packs/day: 2.00    Years: 20.00    Pack years: 40.00    Types: Cigarettes    Quit date: 10/19/2008    Years since quitting: 12.0  . Smokeless tobacco: Never Used  Vaping Use  . Vaping Use: Never used  Substance and Sexual Activity  . Alcohol use: Yes    Comment: occ  . Drug use: No  . Sexual activity: Not on file

## 2020-11-13 ENCOUNTER — Telehealth: Payer: Self-pay | Admitting: *Deleted

## 2020-11-13 ENCOUNTER — Inpatient Hospital Stay (HOSPITAL_COMMUNITY): Payer: Medicare Other

## 2020-11-13 ENCOUNTER — Ambulatory Visit (INDEPENDENT_AMBULATORY_CARE_PROVIDER_SITE_OTHER): Payer: Medicare Other | Admitting: Orthopaedic Surgery

## 2020-11-13 ENCOUNTER — Encounter (HOSPITAL_COMMUNITY): Payer: Self-pay | Admitting: Orthopaedic Surgery

## 2020-11-13 ENCOUNTER — Other Ambulatory Visit: Payer: Self-pay

## 2020-11-13 ENCOUNTER — Ambulatory Visit: Payer: Self-pay

## 2020-11-13 ENCOUNTER — Encounter (HOSPITAL_COMMUNITY)
Admission: RE | Disposition: A | Discharge status: Home Health | Payer: Self-pay | Source: Home / Self Care | Attending: Orthopaedic Surgery

## 2020-11-13 ENCOUNTER — Encounter: Payer: Self-pay | Admitting: Orthopaedic Surgery

## 2020-11-13 ENCOUNTER — Inpatient Hospital Stay (HOSPITAL_COMMUNITY)
Admission: RE | Admit: 2020-11-13 | Discharge: 2020-11-18 | DRG: 857 | Disposition: A | Discharge status: Home Health | Payer: Medicare Other | Attending: Orthopaedic Surgery | Admitting: Orthopaedic Surgery

## 2020-11-13 ENCOUNTER — Inpatient Hospital Stay (HOSPITAL_COMMUNITY): Payer: Medicare Other | Admitting: Anesthesiology

## 2020-11-13 VITALS — BP 132/74 | HR 64 | Temp 98.0°F

## 2020-11-13 DIAGNOSIS — I10 Essential (primary) hypertension: Secondary | ICD-10-CM | POA: Diagnosis present

## 2020-11-13 DIAGNOSIS — T8141XA Infection following a procedure, superficial incisional surgical site, initial encounter: Secondary | ICD-10-CM | POA: Diagnosis present

## 2020-11-13 DIAGNOSIS — W1830XA Fall on same level, unspecified, initial encounter: Secondary | ICD-10-CM | POA: Diagnosis not present

## 2020-11-13 DIAGNOSIS — Z87891 Personal history of nicotine dependence: Secondary | ICD-10-CM

## 2020-11-13 DIAGNOSIS — Z20822 Contact with and (suspected) exposure to covid-19: Secondary | ICD-10-CM | POA: Diagnosis present

## 2020-11-13 DIAGNOSIS — Z85118 Personal history of other malignant neoplasm of bronchus and lung: Secondary | ICD-10-CM

## 2020-11-13 DIAGNOSIS — T8131XA Disruption of external operation (surgical) wound, not elsewhere classified, initial encounter: Secondary | ICD-10-CM | POA: Diagnosis present

## 2020-11-13 DIAGNOSIS — Z96641 Presence of right artificial hip joint: Secondary | ICD-10-CM | POA: Diagnosis present

## 2020-11-13 DIAGNOSIS — Z79899 Other long term (current) drug therapy: Secondary | ICD-10-CM | POA: Diagnosis not present

## 2020-11-13 DIAGNOSIS — Y838 Other surgical procedures as the cause of abnormal reaction of the patient, or of later complication, without mention of misadventure at the time of the procedure: Secondary | ICD-10-CM | POA: Diagnosis present

## 2020-11-13 DIAGNOSIS — I252 Old myocardial infarction: Secondary | ICD-10-CM | POA: Diagnosis not present

## 2020-11-13 DIAGNOSIS — T8149XA Infection following a procedure, other surgical site, initial encounter: Secondary | ICD-10-CM | POA: Diagnosis present

## 2020-11-13 DIAGNOSIS — B9561 Methicillin susceptible Staphylococcus aureus infection as the cause of diseases classified elsewhere: Secondary | ICD-10-CM | POA: Diagnosis not present

## 2020-11-13 DIAGNOSIS — Z419 Encounter for procedure for purposes other than remedying health state, unspecified: Secondary | ICD-10-CM

## 2020-11-13 DIAGNOSIS — B9562 Methicillin resistant Staphylococcus aureus infection as the cause of diseases classified elsewhere: Secondary | ICD-10-CM | POA: Diagnosis present

## 2020-11-13 DIAGNOSIS — A4901 Methicillin susceptible Staphylococcus aureus infection, unspecified site: Secondary | ICD-10-CM

## 2020-11-13 DIAGNOSIS — I739 Peripheral vascular disease, unspecified: Secondary | ICD-10-CM | POA: Diagnosis present

## 2020-11-13 DIAGNOSIS — E785 Hyperlipidemia, unspecified: Secondary | ICD-10-CM | POA: Diagnosis present

## 2020-11-13 DIAGNOSIS — A499 Bacterial infection, unspecified: Secondary | ICD-10-CM | POA: Diagnosis not present

## 2020-11-13 DIAGNOSIS — T8451XA Infection and inflammatory reaction due to internal right hip prosthesis, initial encounter: Secondary | ICD-10-CM | POA: Diagnosis not present

## 2020-11-13 HISTORY — PX: ANTERIOR HIP REVISION: SHX6527

## 2020-11-13 LAB — BASIC METABOLIC PANEL
Anion gap: 8 (ref 5–15)
BUN: 14 mg/dL (ref 8–23)
CO2: 26 mmol/L (ref 22–32)
Calcium: 9.7 mg/dL (ref 8.9–10.3)
Chloride: 105 mmol/L (ref 98–111)
Creatinine, Ser: 1.04 mg/dL (ref 0.61–1.24)
GFR, Estimated: >60 mL/min (ref >60)
Glucose, Bld: 97 mg/dL (ref 70–99)
Potassium: 4.8 mmol/L (ref 3.5–5.1)
Sodium: 139 mmol/L (ref 135–145)

## 2020-11-13 LAB — CBC
HCT: 39.8 % (ref 39.0–52.0)
Hemoglobin: 12.3 g/dL — ABNORMAL LOW (ref 13.0–17.0)
MCH: 29.6 pg (ref 26.0–34.0)
MCHC: 30.9 g/dL (ref 30.0–36.0)
MCV: 95.9 fL (ref 80.0–100.0)
Platelets: 328 10*3/uL (ref 150–400)
RBC: 4.15 MIL/uL — ABNORMAL LOW (ref 4.22–5.81)
RDW: 14.6 % (ref 11.5–15.5)
WBC: 6.6 10*3/uL (ref 4.0–10.5)
nRBC: 0 % (ref 0.0–0.2)

## 2020-11-13 LAB — RESP PANEL BY RT-PCR (FLU A&B, COVID) ARPGX2
Influenza A by PCR: NEGATIVE
Influenza B by PCR: NEGATIVE
SARS Coronavirus 2 by RT PCR: NEGATIVE

## 2020-11-13 SURGERY — REVISION, TOTAL ARTHROPLASTY, HIP, ANTERIOR APPROACH
Anesthesia: General | Site: Hip | Laterality: Right

## 2020-11-13 MED ORDER — METOCLOPRAMIDE HCL 5 MG/ML IJ SOLN: 5.0000 mg | Freq: Three times a day (TID) | INTRAMUSCULAR | Status: DC | PRN

## 2020-11-13 MED ORDER — PROPOFOL 10 MG/ML IV BOLUS
INTRAVENOUS | Status: AC
  Filled 2020-11-13: qty 20

## 2020-11-13 MED ORDER — PHENOL 1.4 % MT LIQD: 1.0000 | OROMUCOSAL | Status: DC | PRN

## 2020-11-13 MED ORDER — VANCOMYCIN HCL 1 G IV SOLR
INTRAVENOUS | Status: DC | PRN
  Administered 2020-11-13: 1000 mg via TOPICAL

## 2020-11-13 MED ORDER — BUPIVACAINE LIPOSOME 1.3 % IJ SUSP
20.0000 mL | Freq: Once | INTRAMUSCULAR | Status: DC
  Filled 2020-11-13: qty 20

## 2020-11-13 MED ORDER — TRANEXAMIC ACID-NACL 1000-0.7 MG/100ML-% IV SOLN
1000.0000 mg | INTRAVENOUS | Status: AC
  Administered 2020-11-13: 1000 mg via INTRAVENOUS
  Filled 2020-11-13: qty 100

## 2020-11-13 MED ORDER — POLYETHYLENE GLYCOL 3350 17 G PO PACK
17.0000 g | PACK | Freq: Every day | ORAL | Status: DC
  Administered 2020-11-13: 17 g via ORAL
  Filled 2020-11-13 (×3): qty 1

## 2020-11-13 MED ORDER — LACTATED RINGERS IV SOLN: INTRAVENOUS | Status: DC

## 2020-11-13 MED ORDER — ACETAMINOPHEN 10 MG/ML IV SOLN
INTRAVENOUS | Status: DC | PRN
  Administered 2020-11-13: 1000 mg via INTRAVENOUS

## 2020-11-13 MED ORDER — ACETAMINOPHEN 500 MG PO TABS: 1000.0000 mg | ORAL_TABLET | Freq: Once | ORAL | Status: DC

## 2020-11-13 MED ORDER — VANCOMYCIN HCL 1000 MG IV SOLR
INTRAVENOUS | Status: AC
  Filled 2020-11-13: qty 1000

## 2020-11-13 MED ORDER — OXYCODONE HCL 5 MG PO TABS: 10.0000 mg | ORAL_TABLET | ORAL | Status: DC | PRN

## 2020-11-13 MED ORDER — PROPOFOL 10 MG/ML IV BOLUS
INTRAVENOUS | Status: DC | PRN
  Administered 2020-11-13: 180 mg via INTRAVENOUS

## 2020-11-13 MED ORDER — KETOROLAC TROMETHAMINE 15 MG/ML IJ SOLN
15.0000 mg | Freq: Four times a day (QID) | INTRAMUSCULAR | Status: AC
  Administered 2020-11-13 – 2020-11-14 (×4): 15 mg via INTRAVENOUS
  Filled 2020-11-13 (×4): qty 1

## 2020-11-13 MED ORDER — HYDROMORPHONE HCL 1 MG/ML IJ SOLN: 0.5000 mg | INTRAMUSCULAR | Status: DC | PRN

## 2020-11-13 MED ORDER — METOPROLOL TARTRATE 25 MG PO TABS
50.0000 mg | ORAL_TABLET | Freq: Two times a day (BID) | ORAL | Status: DC
  Administered 2020-11-13 – 2020-11-18 (×9): 50 mg via ORAL
  Filled 2020-11-13 (×10): qty 2

## 2020-11-13 MED ORDER — ACETAMINOPHEN 325 MG PO TABS
325.0000 mg | ORAL_TABLET | Freq: Four times a day (QID) | ORAL | Status: DC | PRN
  Administered 2020-11-15 – 2020-11-18 (×4): 650 mg via ORAL
  Filled 2020-11-13 (×4): qty 2

## 2020-11-13 MED ORDER — ONDANSETRON HCL 4 MG PO TABS: 4.0000 mg | ORAL_TABLET | Freq: Four times a day (QID) | ORAL | Status: DC | PRN

## 2020-11-13 MED ORDER — CEFAZOLIN SODIUM-DEXTROSE 2-4 GM/100ML-% IV SOLN
INTRAVENOUS | Status: AC
  Filled 2020-11-13: qty 100

## 2020-11-13 MED ORDER — ASPIRIN EC 325 MG PO TBEC
325.0000 mg | DELAYED_RELEASE_TABLET | Freq: Every day | ORAL | Status: DC
Start: 2020-11-14 — End: 2020-11-18
  Administered 2020-11-14 – 2020-11-18 (×5): 325 mg via ORAL
  Filled 2020-11-13 (×5): qty 1

## 2020-11-13 MED ORDER — CHLORHEXIDINE GLUCONATE 4 % EX LIQD: 60.0000 mL | Freq: Once | CUTANEOUS | Status: DC

## 2020-11-13 MED ORDER — MIDAZOLAM HCL 2 MG/2ML IJ SOLN
INTRAMUSCULAR | Status: AC
  Filled 2020-11-13: qty 2

## 2020-11-13 MED ORDER — METOCLOPRAMIDE HCL 5 MG PO TABS
5.0000 mg | ORAL_TABLET | Freq: Three times a day (TID) | ORAL | Status: DC | PRN
Start: 2020-11-13 — End: 2020-11-18

## 2020-11-13 MED ORDER — 0.9 % SODIUM CHLORIDE (POUR BTL) OPTIME
TOPICAL | Status: DC | PRN
  Administered 2020-11-13: 1000 mL

## 2020-11-13 MED ORDER — DOCUSATE SODIUM 100 MG PO CAPS
100.0000 mg | ORAL_CAPSULE | Freq: Two times a day (BID) | ORAL | Status: DC
  Administered 2020-11-13 – 2020-11-18 (×8): 100 mg via ORAL
  Filled 2020-11-13 (×10): qty 1

## 2020-11-13 MED ORDER — BUPIVACAINE-EPINEPHRINE (PF) 0.25% -1:200000 IJ SOLN
INTRAMUSCULAR | Status: AC
  Filled 2020-11-13: qty 20

## 2020-11-13 MED ORDER — BUPIVACAINE LIPOSOME 1.3 % IJ SUSP
INTRAMUSCULAR | Status: DC | PRN
  Administered 2020-11-13: 20 mL

## 2020-11-13 MED ORDER — PANTOPRAZOLE SODIUM 40 MG PO TBEC
40.0000 mg | DELAYED_RELEASE_TABLET | Freq: Every day | ORAL | Status: DC
  Administered 2020-11-14 – 2020-11-18 (×5): 40 mg via ORAL
  Filled 2020-11-13 (×5): qty 1

## 2020-11-13 MED ORDER — DEXTROSE 5 % IV SOLN
INTRAVENOUS | Status: DC | PRN
  Administered 2020-11-13: 3 g via INTRAVENOUS

## 2020-11-13 MED ORDER — SODIUM CHLORIDE 0.9 % IR SOLN
Status: DC | PRN
  Administered 2020-11-13 (×2): 3000 mL

## 2020-11-13 MED ORDER — POVIDONE-IODINE 10 % EX SWAB
2.0000 "application " | Freq: Once | CUTANEOUS | Status: AC
  Administered 2020-11-13: 2 via TOPICAL

## 2020-11-13 MED ORDER — DEXTROSE 5 % IV SOLN
3.0000 g | INTRAVENOUS | Status: DC
  Filled 2020-11-13: qty 3000

## 2020-11-13 MED ORDER — HYDRALAZINE HCL 10 MG PO TABS
25.0000 mg | ORAL_TABLET | Freq: Three times a day (TID) | ORAL | Status: DC
  Administered 2020-11-13 – 2020-11-18 (×13): 25 mg via ORAL
  Filled 2020-11-13 (×14): qty 3

## 2020-11-13 MED ORDER — ACETAMINOPHEN 500 MG PO TABS
1000.0000 mg | ORAL_TABLET | Freq: Four times a day (QID) | ORAL | Status: AC
  Administered 2020-11-13 – 2020-11-14 (×4): 1000 mg via ORAL
  Filled 2020-11-13 (×4): qty 2

## 2020-11-13 MED ORDER — CELECOXIB 200 MG PO CAPS: 200.0000 mg | ORAL_CAPSULE | Freq: Once | ORAL | Status: DC

## 2020-11-13 MED ORDER — DEXAMETHASONE SODIUM PHOSPHATE 10 MG/ML IJ SOLN
INTRAMUSCULAR | Status: DC | PRN
  Administered 2020-11-13: 5 mg via INTRAVENOUS

## 2020-11-13 MED ORDER — VANCOMYCIN HCL 1250 MG/250ML IV SOLN
1250.0000 mg | Freq: Two times a day (BID) | INTRAVENOUS | Status: DC
  Administered 2020-11-13: 1250 mg via INTRAVENOUS
  Filled 2020-11-13 (×2): qty 250

## 2020-11-13 MED ORDER — DEXAMETHASONE SODIUM PHOSPHATE 10 MG/ML IJ SOLN
10.0000 mg | Freq: Once | INTRAMUSCULAR | Status: AC
  Administered 2020-11-14: 10 mg via INTRAVENOUS
  Filled 2020-11-13: qty 1

## 2020-11-13 MED ORDER — SODIUM CHLORIDE 0.9% FLUSH
INTRAVENOUS | Status: DC | PRN
  Administered 2020-11-13: 20 mL

## 2020-11-13 MED ORDER — CLOPIDOGREL BISULFATE 75 MG PO TABS
75.0000 mg | ORAL_TABLET | Freq: Every day | ORAL | Status: DC
  Administered 2020-11-14 – 2020-11-18 (×5): 75 mg via ORAL
  Filled 2020-11-13 (×5): qty 1

## 2020-11-13 MED ORDER — ARTIFICIAL TEARS OPHTHALMIC OINT
TOPICAL_OINTMENT | OPHTHALMIC | Status: AC
  Filled 2020-11-13: qty 3.5

## 2020-11-13 MED ORDER — METHOCARBAMOL 1000 MG/10ML IJ SOLN
500.0000 mg | Freq: Four times a day (QID) | INTRAVENOUS | Status: DC | PRN
  Filled 2020-11-13: qty 5

## 2020-11-13 MED ORDER — FENTANYL CITRATE (PF) 250 MCG/5ML IJ SOLN
INTRAMUSCULAR | Status: AC
  Filled 2020-11-13: qty 5

## 2020-11-13 MED ORDER — ONDANSETRON HCL 4 MG/2ML IJ SOLN: 4.0000 mg | Freq: Four times a day (QID) | INTRAMUSCULAR | Status: DC | PRN

## 2020-11-13 MED ORDER — AMISULPRIDE (ANTIEMETIC) 5 MG/2ML IV SOLN: 10.0000 mg | Freq: Once | INTRAVENOUS | Status: DC | PRN

## 2020-11-13 MED ORDER — SORBITOL 70 % SOLN
30.0000 mL | Freq: Every day | Status: DC | PRN
  Administered 2020-11-16: 30 mL via ORAL
  Filled 2020-11-13 (×2): qty 30

## 2020-11-13 MED ORDER — EPHEDRINE SULFATE-NACL 50-0.9 MG/10ML-% IV SOSY
PREFILLED_SYRINGE | INTRAVENOUS | Status: DC | PRN
  Administered 2020-11-13 (×5): 5 mg via INTRAVENOUS

## 2020-11-13 MED ORDER — SODIUM CHLORIDE 0.9 % IV SOLN: INTRAVENOUS | Status: DC

## 2020-11-13 MED ORDER — FENTANYL CITRATE (PF) 100 MCG/2ML IJ SOLN: 25.0000 ug | INTRAMUSCULAR | Status: DC | PRN

## 2020-11-13 MED ORDER — LIDOCAINE 2% (20 MG/ML) 5 ML SYRINGE
INTRAMUSCULAR | Status: DC | PRN
  Administered 2020-11-13: 100 mg via INTRAVENOUS

## 2020-11-13 MED ORDER — SODIUM CHLORIDE 0.9 % IR SOLN
Status: DC | PRN
  Administered 2020-11-13: 1000 mL

## 2020-11-13 MED ORDER — TRANEXAMIC ACID 1000 MG/10ML IV SOLN
2000.0000 mg | INTRAVENOUS | Status: DC
  Filled 2020-11-13: qty 20

## 2020-11-13 MED ORDER — ORAL CARE MOUTH RINSE: 15.0000 mL | Freq: Once | OROMUCOSAL | Status: AC

## 2020-11-13 MED ORDER — FENTANYL CITRATE (PF) 250 MCG/5ML IJ SOLN
INTRAMUSCULAR | Status: DC | PRN
  Administered 2020-11-13: 25 ug via INTRAVENOUS
  Administered 2020-11-13: 50 ug via INTRAVENOUS
  Administered 2020-11-13: 25 ug via INTRAVENOUS
  Administered 2020-11-13: 50 ug via INTRAVENOUS
  Administered 2020-11-13 (×4): 25 ug via INTRAVENOUS

## 2020-11-13 MED ORDER — DIPHENHYDRAMINE HCL 12.5 MG/5ML PO ELIX
25.0000 mg | ORAL_SOLUTION | ORAL | Status: DC | PRN
  Filled 2020-11-13: qty 10

## 2020-11-13 MED ORDER — OXYCODONE HCL 5 MG PO TABS: 5.0000 mg | ORAL_TABLET | ORAL | Status: DC | PRN

## 2020-11-13 MED ORDER — METHOCARBAMOL 500 MG PO TABS: 500.0000 mg | ORAL_TABLET | Freq: Four times a day (QID) | ORAL | Status: DC | PRN

## 2020-11-13 MED ORDER — ACETAMINOPHEN 10 MG/ML IV SOLN
INTRAVENOUS | Status: AC
  Filled 2020-11-13: qty 100

## 2020-11-13 MED ORDER — OXYCODONE HCL ER 10 MG PO T12A
10.0000 mg | EXTENDED_RELEASE_TABLET | Freq: Two times a day (BID) | ORAL | Status: DC
  Administered 2020-11-13 – 2020-11-17 (×7): 10 mg via ORAL
  Filled 2020-11-13 (×7): qty 1

## 2020-11-13 MED ORDER — AMLODIPINE BESYLATE 5 MG PO TABS
5.0000 mg | ORAL_TABLET | Freq: Two times a day (BID) | ORAL | Status: DC
  Administered 2020-11-13 – 2020-11-18 (×10): 5 mg via ORAL
  Filled 2020-11-13 (×10): qty 1

## 2020-11-13 MED ORDER — ONDANSETRON HCL 4 MG/2ML IJ SOLN
INTRAMUSCULAR | Status: DC | PRN
  Administered 2020-11-13: 4 mg via INTRAVENOUS

## 2020-11-13 MED ORDER — MAGNESIUM CITRATE PO SOLN: 1.0000 | Freq: Once | ORAL | Status: DC | PRN

## 2020-11-13 MED ORDER — TRANEXAMIC ACID-NACL 1000-0.7 MG/100ML-% IV SOLN
1000.0000 mg | Freq: Once | INTRAVENOUS | Status: AC
  Administered 2020-11-13: 1000 mg via INTRAVENOUS
  Filled 2020-11-13: qty 100

## 2020-11-13 MED ORDER — CHLORHEXIDINE GLUCONATE 0.12 % MT SOLN
15.0000 mL | Freq: Once | OROMUCOSAL | Status: AC
  Administered 2020-11-13: 15 mL via OROMUCOSAL
  Filled 2020-11-13: qty 15

## 2020-11-13 MED ORDER — BUPIVACAINE HCL (PF) 0.25 % IJ SOLN
INTRAMUSCULAR | Status: AC
  Filled 2020-11-13: qty 30

## 2020-11-13 MED ORDER — MENTHOL 3 MG MT LOZG: 1.0000 | LOZENGE | OROMUCOSAL | Status: DC | PRN

## 2020-11-13 MED ORDER — IRRISEPT - 450ML BOTTLE WITH 0.05% CHG IN STERILE WATER, USP 99.95% OPTIME
TOPICAL | Status: DC | PRN
  Administered 2020-11-13: 450 mL via TOPICAL

## 2020-11-13 MED ORDER — CEFAZOLIN SODIUM-DEXTROSE 1-4 GM/50ML-% IV SOLN
INTRAVENOUS | Status: AC
  Filled 2020-11-13: qty 50

## 2020-11-13 MED ORDER — ALUM & MAG HYDROXIDE-SIMETH 200-200-20 MG/5ML PO SUSP: 30.0000 mL | ORAL | Status: DC | PRN

## 2020-11-13 SURGICAL SUPPLY — 63 items
BAG DECANTER FOR FLEXI CONT (MISCELLANEOUS) ×1 IMPLANT
CANISTER WOUNDNEG PRESSURE 500 (CANNISTER) ×1 IMPLANT
CELLS DAT CNTRL 66122 CELL SVR (MISCELLANEOUS) IMPLANT
COVER PERINEAL POST (MISCELLANEOUS) ×2 IMPLANT
COVER SURGICAL LIGHT HANDLE (MISCELLANEOUS) ×2 IMPLANT
COVER WAND RF STERILE (DRAPES) ×1 IMPLANT
DRAPE C-ARM 42X72 X-RAY (DRAPES) ×2 IMPLANT
DRAPE INCISE IOBAN 66X45 STRL (DRAPES) ×1 IMPLANT
DRAPE POUCH INSTRU U-SHP 10X18 (DRAPES) ×2 IMPLANT
DRAPE STERI IOBAN 125X83 (DRAPES) ×2 IMPLANT
DRAPE U-SHAPE 47X51 STRL (DRAPES) ×4 IMPLANT
DRSG AQUACEL AG ADV 3.5X10 (GAUZE/BANDAGES/DRESSINGS) ×1 IMPLANT
DRSG MEPITEL 4X7.2 (GAUZE/BANDAGES/DRESSINGS) ×1 IMPLANT
DURAPREP 26ML APPLICATOR (WOUND CARE) ×4 IMPLANT
ELECT BLADE 4.0 EZ CLEAN MEGAD (MISCELLANEOUS) ×2
ELECT REM PT RETURN 9FT ADLT (ELECTROSURGICAL) ×2
ELECTRODE BLDE 4.0 EZ CLN MEGD (MISCELLANEOUS) ×1 IMPLANT
ELECTRODE REM PT RTRN 9FT ADLT (ELECTROSURGICAL) ×1 IMPLANT
GLOVE BIOGEL PI IND STRL 7.0 (GLOVE) ×1 IMPLANT
GLOVE BIOGEL PI INDICATOR 7.0 (GLOVE) ×1
GLOVE ECLIPSE 7.0 STRL STRAW (GLOVE) ×4 IMPLANT
GLOVE SKINSENSE NS SZ7.5 (GLOVE) ×1
GLOVE SKINSENSE STRL SZ7.5 (GLOVE) ×1 IMPLANT
GLOVE SURG SYN 7.5  E (GLOVE) ×8
GLOVE SURG SYN 7.5 E (GLOVE) ×4 IMPLANT
GLOVE SURG SYN 7.5 PF PI (GLOVE) ×4 IMPLANT
GOWN STRL REIN XL XLG (GOWN DISPOSABLE) ×2 IMPLANT
GOWN STRL REUS W/ TWL LRG LVL3 (GOWN DISPOSABLE) IMPLANT
GOWN STRL REUS W/ TWL XL LVL3 (GOWN DISPOSABLE) ×1 IMPLANT
GOWN STRL REUS W/TWL LRG LVL3 (GOWN DISPOSABLE)
GOWN STRL REUS W/TWL XL LVL3 (GOWN DISPOSABLE) ×2
HANDPIECE INTERPULSE COAX TIP (DISPOSABLE) ×2
HOOD PEEL AWAY FLYTE STAYCOOL (MISCELLANEOUS) ×5 IMPLANT
IV NS IRRIG 3000ML ARTHROMATIC (IV SOLUTION) ×2 IMPLANT
JET LAVAGE IRRISEPT WOUND (IRRIGATION / IRRIGATOR) ×2
KIT BASIN OR (CUSTOM PROCEDURE TRAY) ×2 IMPLANT
LAVAGE JET IRRISEPT WOUND (IRRIGATION / IRRIGATOR) ×1 IMPLANT
MARKER SKIN DUAL TIP RULER LAB (MISCELLANEOUS) ×2 IMPLANT
NDL SPNL 18GX3.5 QUINCKE PK (NEEDLE) ×1 IMPLANT
NEEDLE SPNL 18GX3.5 QUINCKE PK (NEEDLE) ×4 IMPLANT
PACK TOTAL JOINT (CUSTOM PROCEDURE TRAY) ×2 IMPLANT
PACK UNIVERSAL I (CUSTOM PROCEDURE TRAY) ×2 IMPLANT
RETRACTOR WND ALEXIS 18 MED (MISCELLANEOUS) IMPLANT
RTRCTR WOUND ALEXIS 18CM MED (MISCELLANEOUS)
SAW OSC TIP CART 19.5X105X1.3 (SAW) ×2 IMPLANT
SET HNDPC FAN SPRY TIP SCT (DISPOSABLE) ×1 IMPLANT
STAPLER VISISTAT 35W (STAPLE) IMPLANT
SUT ETHIBOND 2 V 37 (SUTURE) ×2 IMPLANT
SUT ETHILON 2 0 PSLX (SUTURE) ×2 IMPLANT
SUT MON AB 2-0 CT1 36 (SUTURE) ×2 IMPLANT
SUT PDS AB 1 TP1 54 (SUTURE) ×1 IMPLANT
SUT VIC AB 0 CT1 27 (SUTURE)
SUT VIC AB 0 CT1 27XBRD ANBCTR (SUTURE) ×1 IMPLANT
SUT VIC AB 1 CTX 36 (SUTURE) ×2
SUT VIC AB 1 CTX36XBRD ANBCTR (SUTURE) ×1 IMPLANT
SUT VIC AB 2-0 CT1 27 (SUTURE) ×4
SUT VIC AB 2-0 CT1 TAPERPNT 27 (SUTURE) ×2 IMPLANT
SYR 50ML LL SCALE MARK (SYRINGE) ×2 IMPLANT
TOWEL GREEN STERILE (TOWEL DISPOSABLE) ×2 IMPLANT
TRAY CATH 16FR W/PLASTIC CATH (SET/KITS/TRAYS/PACK) IMPLANT
TRAY FOLEY W/BAG SLVR 16FR (SET/KITS/TRAYS/PACK)
TRAY FOLEY W/BAG SLVR 16FR ST (SET/KITS/TRAYS/PACK) ×1 IMPLANT
YANKAUER SUCT BULB TIP NO VENT (SUCTIONS) ×2 IMPLANT

## 2020-11-13 NOTE — Progress Notes (Signed)
Office Visit Note   Patient: Sean Gardner           Date of Birth: [birth year]           MRN: [id number] Visit Date: 11/13/2020              Requested by: [name], MD [place],  [place] [id number] PCP: [name], MD   Assessment & Plan: Visit Diagnoses:  1. Status post total hip replacement, right     Plan: Impression is surgical site infection concerning for deep prosthetic joint infection.  We will need to address this urgently in the operating room.  He is to remain n.p.o.  We will get him admitted to the hospital for planned surgery this afternoon.  CRP, ESR, CBC obtained today.  Follow-Up Instructions: Return if symptoms worsen or fail to improve.   Orders:  Orders Placed This Encounter  Procedures  . XR HIP UNILAT W OR W/O PELVIS 2-3 VIEWS RIGHT   No orders of the defined types were placed in this encounter.     Procedures: No procedures performed   Clinical Data: No additional findings.   Subjective: Chief Complaint  Patient presents with  . Right Hip - Pain    Sean Gardner is 8 weeks status post right total hip replacement.  He states that he fell 2 days ago in the middle the night.  He noticed some drainage from the right hip surgical site yesterday.  He has had redness.  He was evaluated by physical therapist who recommended he get evaluated.  [place] a bowling alley and noticed drainage.  Comes in today for evaluation.  Denies any constitutional symptoms.   Review of Systems   Objective: Vital Signs: There were no vitals taken for this visit.  Physical Exam  Ortho Exam Right hip shows swelling and erythema cellulitis surrounding the surgical scar.  There is about 1 cm opening with yellowish drainage.  This is tender to palpation.  Specialty Comments:  No specialty comments available.  Imaging: XR HIP UNILAT W OR W/O PELVIS 2-3 VIEWS RIGHT  Result Date: 11/13/2020 Stable right total hip replacement without  complication.    PMFS History: Patient Active Problem List   Diagnosis Date Noted  . Status post total hip replacement, right 11/13/2020  . Status post total replacement of right hip 09/16/2020  . Primary osteoarthritis of right hip 09/15/2020  . Hyperkalemia 01/10/2020  . Bone metastasis ([place]) 09/05/2018  . Sepsis secondary to UTI ([place]) 05/11/2016  . CAD (coronary artery disease) 05/11/2016  . Hyperlipidemia 05/11/2016  . Morbid (severe) obesity due to excess calories ([place]) 03/21/2016  . Sleep apnea 09/18/2015  . Dyspnea and respiratory abnormality 09/18/2015  . Dyspnea 10/11/2012  . Essential hypertension 10/11/2012  . Lung cancer ([place]) 03/23/2012   Past Medical History:  Diagnosis Date  . Claudication in peripheral vascular disease ([place])   . Hyperlipidemia   . Hypertension   . Lung cancer ([place])    neuroendocrine lung ca dx 2010  . Myocardial infarction ([place]) 07/2000  . Peripheral vascular disease ([place])   . Shortness of breath    on exertion,can't lay on his back  . Sleep apnea    cpap    Family History  Problem Relation Age of Onset  . Hypertension Mother   . Lung cancer Father     Past Surgical History:  Procedure Laterality Date  . ACHILLES TENDON SURGERY    . CORONARY ARTERY  BYPASS GRAFT  08/18/2000   LIMA--dLIMA, RIMA-dRCA, left RA-OM2, SVG-D2  . FLEXIBLE SIGMOIDOSCOPY N/A 01/18/2013   Procedure: FLEXIBLE SIGMOIDOSCOPY;  Surgeon: Arta Silence, MD;  [facility];  Service: Endoscopy;  Laterality: N/A;  . HERNIA REPAIR     inguinal  . HOT HEMOSTASIS N/A 01/18/2013   Procedure: HOT HEMOSTASIS (ARGON PLASMA COAGULATION/BICAP);  Surgeon: Arta Silence, MD;  Location: [facility];  Service: Endoscopy;  Laterality: N/A;  . LUNG SURGERY Left 01/29/2009   left lower lobectomy   . TOTAL HIP ARTHROPLASTY Right 09/16/2020   Procedure: RIGHT TOTAL HIP ARTHROPLASTY ANTERIOR APPROACH;  Surgeon: Leandrew Koyanagi, MD;  Location: [facility];  Service: Orthopedics;   Laterality: Right;   Social History   Occupational History  . Not on file  Tobacco Use  . Smoking status: Former Smoker    Packs/day: 2.00    Years: 20.00    Pack years: 40.00    Types: Cigarettes    Quit date: 10/19/2008    Years since quitting: 12.0  . Smokeless tobacco: Never Used  Vaping Use  . Vaping Use: Never used  Substance and Sexual Activity  . Alcohol use: Yes    Comment: occ  . Drug use: No  . Sexual activity: Not on file

## 2020-11-13 NOTE — Anesthesia Preprocedure Evaluation (Addendum)
Anesthesia Evaluation  Patient identified by MRN, date of birth, ID band Patient awake    Reviewed: Allergy & Precautions, Patient's Chart, lab work & pertinent test results  History of Anesthesia Complications Negative for: history of anesthetic complications  Airway Mallampati: II  TM Distance: >3 FB Neck ROM: Full    Dental no notable dental hx. (+) Dental Advisory Given   Pulmonary shortness of breath, sleep apnea and Continuous Positive Airway Pressure Ventilation , former smoker,    Pulmonary exam normal        Cardiovascular Exercise Tolerance: Good hypertension, Pt. on medications + CAD, + Past MI (MI 2001), + CABG and + Peripheral Vascular Disease  Normal cardiovascular exam  Cards low risk   Neuro/Psych    GI/Hepatic negative GI ROS, Neg liver ROS,   Endo/Other  negative endocrine ROS  Renal/GU negative Renal ROS     Musculoskeletal negative musculoskeletal ROS (+)   Abdominal (+) - obese,   Peds  Hematology Hgb 14.5 Plt 265   Anesthesia Other Findings   Reproductive/Obstetrics                            Anesthesia Physical  Anesthesia Plan  ASA: III  Anesthesia Plan: General   Post-op Pain Management:    Induction: Intravenous  PONV Risk Score and Plan: 2 and Treatment may vary due to age or medical condition, Ondansetron and Dexamethasone  Airway Management Planned: LMA  Additional Equipment: None  Intra-op Plan:   Post-operative Plan: Extubation in OR  Informed Consent: I have reviewed the patients History and Physical, chart, labs and discussed the procedure including the risks, benefits and alternatives for the proposed anesthesia with the patient or authorized representative who has indicated his/her understanding and acceptance.     Dental advisory given  Plan Discussed with: Anesthesiologist  Anesthesia Plan Comments: ( )       Anesthesia  Quick Evaluation

## 2020-11-13 NOTE — Telephone Encounter (Signed)
60 day Ortho bundle in office meeting. Patient will be taken to OR today by Dr. Erlinda Hong to wash out Right hip with concern for infection. Will continue to assess for case management needs and continue to follow.

## 2020-11-13 NOTE — H&P (Signed)

## 2020-11-13 NOTE — Progress Notes (Signed)
Pharmacy Antibiotic Note  Sean Gardner is a 65 y.o. male admitted on 11/13/2020 with hip infection.  Pharmacy has been consulted for vanc dosing.  Pt with recent THA who presented with surgical site infection with concern for deep prosthetic joint. He was taken to OR today for I&D. Culture was sent. Vanc has been ordered empirically. He got vanc powder in the OR today.   Scr 1.04>>CrCl 93 ml/min  Plan: Vanc 1.25g IV q12>>AUC 453, scr 1, clinccal Level as needed  Height: 6' (182.9 cm) Weight: 118.4 kg (261 lb) IBW/kg (Calculated) : 77.6  Temp (24hrs), Avg:97.6 F (36.4 C), Min:97 F (36.1 C), Max:98 F (36.7 C)  Recent Labs  Lab 11/13/20 1307  WBC 6.6  CREATININE 1.04    Estimated Creatinine Clearance: 92.8 mL/min (by C-G formula based on SCr of 1.04 mg/dL).    Allergies  Allergen Reactions  . Lisinopril Other (See Comments)    Hyperkalemia  . Zolpidem Tartrate Other (See Comments)    Felt fuzzy and hallucinations    Antimicrobials this admission: 1/26 vanc>>  Dose adjustments this admission:   Microbiology results: 1/26 fungal>> 1/26 wound>>  Onnie Boer, PharmD, [place], AAHIVP, CPP Infectious Disease Pharmacist 11/13/2020 8:03 PM

## 2020-11-13 NOTE — Discharge Instructions (Signed)

## 2020-11-13 NOTE — Op Note (Signed)
Date of Surgery: 11/13/2020  INDICATIONS: Mr. Sean Gardner is a 65 y.o.-year-old male with a right hip surgical site infection.  The patient did consent to the procedure after discussion of the risks and benefits.  PREOPERATIVE DIAGNOSIS: Right hip surgical site infection, possible deep prosthetic hip infection  POSTOPERATIVE DIAGNOSIS: Same.  PROCEDURE:  1. Excisional debridement of right hip surgical site infection including skin, subcutaneous tissue, fascia, muscle approximately 80 cm 2. Aspiration of right hip joint 3. Secondary closure of right hip surgical wound dehiscence 4. Application of incisional wound VAC  SURGEON: N. Eduard Roux, M.D.  ASSIST: Ciro Backer [place], [place]; necessary for the timely completion of procedure and due to complexity of procedure.  ANESTHESIA:  general  IV FLUIDS AND URINE: See anesthesia.  ESTIMATED BLOOD LOSS: 100 mL.  IMPLANTS: None  DRAINS: Incisional VAC  COMPLICATIONS: see description of procedure.  DESCRIPTION OF PROCEDURE: The patient was brought to the operating room.  The patient had been signed prior to the procedure and this was documented. The patient had the anesthesia placed by the anesthesiologist.  A time-out was performed to confirm that this was the correct patient, site, side and location.  Antibiotics were held initially for cultures and then given afterwards. The patient had the operative extremity prepped and draped in the standard surgical fashion.    I first sharply excised the previous surgical scar in a full-thickness fashion including the dehisced area.  There was return of a scant amount of purulence and mainly fat necrosis.  This was cultured immediately.  We then took tissue cultures.  We then continued sharp excisional debridement with a 10 blade of the subcutaneous tissue down to the fascia.  Sharp excisional debridement was then continued for the subcutaneous tissue and the fascia with a Cobb and rongeur.  I then  inspected the fascia and this demonstrated no evidence of deeper infection.  Just to be sure I did open up the fascia and inspected the underlying muscle which was normal and healthy appearing and did not demonstrate any evidence of deep infection.  The wound was then thoroughly irrigated with pulse lavage.  After this was done I then used fluoroscopy to localize 18-gauge spinal needle which I placed into the hip joint and aspirated approximately 20 cc of serosanguineous fluid.  This was sent for culture as well.  Due to the fact that did not find any obvious evidence of deep prosthetic infection I decided not to continue my debridement deeper into the prosthetic joint.  I then thoroughly irrigated this area again with irrisept.  Hemostasis was obtained.  A gram of vancomycin powder was placed in the surgical wound.  The fascia was closed with interrupted #1 PDS.  Secondary closure of surgical wound dehiscence performed with 2-0 Monocryl for the subcutaneous layer and 2-0 nylon for the skin.  Mepitel was then placed on top of the incision and an incisional VAC was placed on top of this.  Patient tolerated the procedure well had no immediate complications.  Tawanna Cooler was necessary for opening, closing, retracting, limb positioning and overall facilitation and timely completion of the procedure.  POSTOPERATIVE PLAN: Patient will be admitted to my service for IV antibiotics.  We will await the intraoperative cultures and consult infectious diseases for antibiotic treatment and duration.  If the fluid from the hip aspiration shows infection then he will need to return back to the operating room for a repeat washout with exchange of components.  Azucena Cecil, MD 6:18  PM

## 2020-11-13 NOTE — Transfer of Care (Signed)
Immediate Anesthesia Transfer of Care Note  Patient: Sean Gardner  Procedure(s) Performed: IRRIGATION AND DEBRIDEMENT RIGHT HIP (Right Hip)  Patient Location: PACU  Anesthesia Type:General  Level of Consciousness: drowsy and patient cooperative  Airway & Oxygen Therapy: Patient Spontanous Breathing and Patient connected to face mask oxygen  Post-op Assessment: Report given to RN and Post -op Vital signs reviewed and stable  Post vital signs: Reviewed  Last Vitals:  Vitals Value Taken Time  BP    Temp    Pulse 70 11/13/20 1830  Resp 13 11/13/20 1830  SpO2 100 % 11/13/20 1830  Vitals shown include unvalidated device data.  Last Pain:  Vitals:   11/13/20 1351  TempSrc:   PainSc: 0-No pain         Complications: No complications documented.

## 2020-11-13 NOTE — Anesthesia Postprocedure Evaluation (Signed)
Anesthesia Post Note  Patient: Sean Gardner  Procedure(s) Performed: IRRIGATION AND DEBRIDEMENT RIGHT HIP (Right Hip)     Patient location during evaluation: PACU Anesthesia Type: General Level of consciousness: awake Pain management: pain level controlled Vital Signs Assessment: post-procedure vital signs reviewed and stable Respiratory status: spontaneous breathing, nonlabored ventilation, respiratory function stable and patient connected to nasal cannula oxygen Cardiovascular status: blood pressure returned to baseline and stable Postop Assessment: no apparent nausea or vomiting Anesthetic complications: no   No complications documented.  Last Vitals:  Vitals:   11/13/20 1930 11/13/20 1948  BP: 114/65 116/63  Pulse: 61 63  Resp: 18 20  Temp: (!) 36.2 C 36.4 C  SpO2: 99% 100%    Last Pain:  Vitals:   11/13/20 2030  TempSrc:   PainSc: 2                  Neils [place] P Chancey [place]

## 2020-11-13 NOTE — Anesthesia Procedure Notes (Signed)
Procedure Name: LMA Insertion Date/Time: 11/13/2020 4:44 PM Performed by: Janene Harvey, CRNA Pre-anesthesia Checklist: Patient identified, Emergency Drugs available, Suction available and Patient being monitored Patient Re-evaluated:Patient Re-evaluated prior to induction Oxygen Delivery Method: Circle system utilized Preoxygenation: Pre-oxygenation with 100% oxygen Induction Type: IV induction LMA: LMA inserted LMA Size: 5.0 Placement Confirmation: positive ETCO2 Dental Injury: Teeth and Oropharynx as per pre-operative assessment

## 2020-11-14 ENCOUNTER — Encounter (HOSPITAL_COMMUNITY): Payer: Self-pay | Admitting: Orthopaedic Surgery

## 2020-11-14 ENCOUNTER — Inpatient Hospital Stay: Payer: Self-pay

## 2020-11-14 DIAGNOSIS — W1830XA Fall on same level, unspecified, initial encounter: Secondary | ICD-10-CM | POA: Diagnosis not present

## 2020-11-14 DIAGNOSIS — A499 Bacterial infection, unspecified: Secondary | ICD-10-CM | POA: Diagnosis not present

## 2020-11-14 DIAGNOSIS — T8149XA Infection following a procedure, other surgical site, initial encounter: Secondary | ICD-10-CM | POA: Diagnosis not present

## 2020-11-14 LAB — CBC
HCT: 33.9 % — ABNORMAL LOW (ref 39.0–52.0)
Hemoglobin: 10.5 g/dL — ABNORMAL LOW (ref 13.0–17.0)
MCH: 29.8 pg (ref 26.0–34.0)
MCHC: 31 g/dL (ref 30.0–36.0)
MCV: 96.3 fL (ref 80.0–100.0)
Platelets: 300 10*3/uL (ref 150–400)
RBC: 3.52 MIL/uL — ABNORMAL LOW (ref 4.22–5.81)
RDW: 14.6 % (ref 11.5–15.5)
WBC: 7.6 10*3/uL (ref 4.0–10.5)
nRBC: 0 % (ref 0.0–0.2)

## 2020-11-14 LAB — CBC WITH DIFFERENTIAL/PLATELET
Absolute Monocytes: 578 cells/uL (ref 200–950)
Basophils Absolute: 53 cells/uL (ref 0–200)
Basophils Relative: 0.7 %
Eosinophils Absolute: 143 cells/uL (ref 15–500)
Eosinophils Relative: 1.9 %
HCT: 42.5 % (ref 38.5–50.0)
Hemoglobin: 13.9 g/dL (ref 13.2–17.1)
Lymphs Abs: 1223 cells/uL (ref 850–3900)
MCH: 30.9 pg (ref 27.0–33.0)
MCHC: 32.7 g/dL (ref 32.0–36.0)
MCV: 94.4 fL (ref 80.0–100.0)
MPV: 9.6 fL (ref 7.5–12.5)
Monocytes Relative: 7.7 %
Neutro Abs: 5505 cells/uL (ref 1500–7800)
Neutrophils Relative %: 73.4 %
Platelets: 402 10*3/uL — ABNORMAL HIGH (ref 140–400)
RBC: 4.5 10*6/uL (ref 4.20–5.80)
RDW: 13.7 % (ref 11.0–15.0)
Total Lymphocyte: 16.3 %
WBC: 7.5 10*3/uL (ref 3.8–10.8)

## 2020-11-14 LAB — BASIC METABOLIC PANEL
Anion gap: 6 (ref 5–15)
BUN: 15 mg/dL (ref 8–23)
CO2: 25 mmol/L (ref 22–32)
Calcium: 9.2 mg/dL (ref 8.9–10.3)
Chloride: 106 mmol/L (ref 98–111)
Creatinine, Ser: 1.11 mg/dL (ref 0.61–1.24)
GFR, Estimated: >60 mL/min (ref >60)
Glucose, Bld: 133 mg/dL — ABNORMAL HIGH (ref 70–99)
Potassium: 5 mmol/L (ref 3.5–5.1)
Sodium: 137 mmol/L (ref 135–145)

## 2020-11-14 LAB — C-REACTIVE PROTEIN: CRP: 75.7 mg/L — ABNORMAL HIGH (ref <8.0)

## 2020-11-14 LAB — SEDIMENTATION RATE: Sed Rate: 80 mm/h — ABNORMAL HIGH (ref 0–20)

## 2020-11-14 MED ORDER — MELATONIN 3 MG PO TABS
3.0000 mg | ORAL_TABLET | Freq: Every evening | ORAL | Status: DC | PRN
  Administered 2020-11-14: 3 mg via ORAL

## 2020-11-14 MED ORDER — MELATONIN 3 MG PO TABS
3.0000 mg | ORAL_TABLET | Freq: Every day | ORAL | Status: DC
  Administered 2020-11-15 – 2020-11-17 (×3): 3 mg via ORAL
  Filled 2020-11-14 (×4): qty 1

## 2020-11-14 MED ORDER — VANCOMYCIN HCL IN DEXTROSE 1-5 GM/200ML-% IV SOLN
1000.0000 mg | Freq: Two times a day (BID) | INTRAVENOUS | Status: DC
  Administered 2020-11-14 – 2020-11-15 (×3): 1000 mg via INTRAVENOUS
  Filled 2020-11-14 (×3): qty 200

## 2020-11-14 NOTE — Consult Note (Signed)
[facility]  Total days of antibiotics 1        Day 1               Reason for Consult: Postop wound infection    Referring Physician: Frankey Shown, MD  Active Problems:   Postoperative wound infection of right hip    HPI: ZELL HYLTON is a 65 y.o. male with PMH OSA, PVD, HTN HLD and a recent right hip replacement who presented to the hospital for a right hip washout due to concern for possible infection.   Patient states he and his wife were in the [employer] and after a while, he started having difficulty with hip. He had a right hip replacement 2 months ago and has been doing physical therapy with no problems. But a week ago, he fell and hit his right hip. Did not have any significant pain but started noticing swelling around that hip.  On Monday, he noticed some redness and swelling around his right hip.  On Tuesday, the right hip swelling started draining.  He was seen by his physical therapist he was advised to follow-up with his orthopedic surgeon due to tenderness on the right hip.  Patient was seen by his orthopedic surgeon on Wednesday, and the right hip showed cellulitis and swelling around a tender surgical site with a 1 cm opening with yellowish drainage. Patient was admitted to the hospital for hip washout with concern for deep prosthetic joint infection. Dr. Erlinda Hong performed an excisional debridement of right hip surgical site infection and aspiration of the right hip joint.  Tissue and wound cultures were collected. He was started on empirical antibiotics with vancomycin.  ID was consulted for further evaluation and management of if his infection.  On assessment, patient states he is doing well. He denies any fever, chills, nausea, vomiting, or hip pain. States he has been able to walk multiple times in the hallway without any problems.  Patient was informed that we will likely place a PICC line for long-term IV antibiotic therapy.  Past Medical History:   Diagnosis Date  . Claudication in peripheral vascular disease ([place])   . Hyperlipidemia   . Hypertension   . Lung cancer ([place])    neuroendocrine lung ca dx 2010  . Myocardial infarction ([place]) 07/2000  . Peripheral vascular disease ([place])   . Shortness of breath    on exertion,can't lay on his back  . Sleep apnea    cpap    Allergies:  Allergies  Allergen Reactions  . Lisinopril Other (See Comments)    Hyperkalemia  . Zolpidem Tartrate Other (See Comments)    Felt fuzzy and hallucinations    Current antibiotics: Vancomycin 1000 mg every 12 hours  MEDICATIONS: . amLODipine  5 mg Oral BID  . aspirin EC  325 mg Oral Daily  . clopidogrel  75 mg Oral QAC breakfast  . docusate sodium  100 mg Oral BID  . hydrALAZINE  25 mg Oral TID  . melatonin  3 mg Oral QHS  . metoprolol tartrate  50 mg Oral BID  . oxyCODONE  10 mg Oral Q12H  . pantoprazole  40 mg Oral Daily  . polyethylene glycol  17 g Oral Daily    Social History   Tobacco Use  . Smoking status: Former Smoker    Packs/day: 2.00    Years: 20.00    Pack years: 40.00    Types: Cigarettes    Quit  date: 10/19/2008    Years since quitting: 12.0  . Smokeless tobacco: Never Used  Vaping Use  . Vaping Use: Never used  Substance Use Topics  . Alcohol use: Yes    Comment: occ  . Drug use: No    Family History  Problem Relation Age of Onset  . Hypertension Mother   . Lung cancer Father     Review of Systems -  Constitutional: Negative for fevers or chills.  Positive for weight loss Respiratory: Negative for shortness of breath or wheezing Cardiovascular: Negative for chest pain or palpitations Abdomen: Negative for abdominal pain, nausea or vomiting MSK: Negative for leg pain Neuro: Negative for weakness, headaches, dizziness or numbness  OBJECTIVE: Temp:  [97 F (36.1 C)-97.9 F (36.6 C)] 97.5 F (36.4 C) (01/27 0800) Pulse Rate:  [53-70] 57 (01/27 0800) Resp:  [14-20] 17 (01/27 0800) BP:  (110-131)/(56-70) 120/69 (01/27 0800) SpO2:  [96 %-100 %] 98 % (01/27 0800)   Physical exam Constitutional: Well-appearing obese elderly man laying comfortably in bed with spouse in the room.  No acute distress Head: Normocephalic, atraumatic Respiratory: Lungs CTAB.  No wheezing or rales Cardiovascular: RRR. No m/r/g. No LE edema Abdomen: Soft, nontender, nondistended.  Normal bowel sounds Extremity: Right hip with an area of tender induration around the surgical site.  Wound VAC in place.  Neurovascularly intact distally. Skin: Warm and dry Neuro: A&Ox3.  Moves all extremities.  Normal sensation. Psych: Normal mood and affect  LABS: Results for orders placed or performed during the hospital encounter of 11/13/20 (from the past 48 hour(s))  Resp Panel by RT-PCR (Flu A&B, Covid) Nasopharyngeal Swab     Status: None   Collection Time: 11/13/20 12:51 PM   Specimen: Nasopharyngeal Swab; Nasopharyngeal(NP) swabs in vial transport medium  Result Value Ref Range   SARS Coronavirus 2 by RT PCR NEGATIVE NEGATIVE    Comment: (NOTE) SARS-CoV-2 target nucleic acids are NOT DETECTED.  The SARS-CoV-2 RNA is generally detectable in upper respiratory specimens during the acute phase of infection. The lowest concentration of SARS-CoV-2 viral copies this assay can detect is 138 copies/mL. A negative result does not preclude SARS-Cov-2 infection and should not be used as the sole basis for treatment or other patient management decisions. A negative result may occur with  improper specimen collection/handling, submission of specimen other than nasopharyngeal swab, presence of viral mutation(s) within the areas targeted by this assay, and inadequate number of viral copies(<138 copies/mL). A negative result must be combined with clinical observations, patient history, and epidemiological information. The expected result is Negative.  Fact Sheet for Patients:   EntrepreneurPulse.com.au  Fact Sheet for Healthcare Providers:  IncredibleEmployment.be  This test is no t yet approved or cleared by the [place] FDA and  has been authorized for detection and/or diagnosis of SARS-CoV-2 by FDA under an Emergency Use Authorization (EUA). This EUA will remain  in effect (meaning this test can be used) for the duration of the COVID-19 declaration under Section 564(b)(1) of the Act, 21 U.S.C.section 360bbb-3(b)(1), unless the authorization is terminated  or revoked sooner.       Influenza A by PCR NEGATIVE NEGATIVE   Influenza B by PCR NEGATIVE NEGATIVE    Comment: (NOTE) The Xpert Xpress SARS-CoV-2/FLU/RSV plus assay is intended as an aid in the diagnosis of influenza from Nasopharyngeal swab specimens and should not be used as a sole basis for treatment. Nasal washings and aspirates are unacceptable for Xpert Xpress SARS-CoV-2/FLU/RSV testing.  Fact Sheet  for Patients: EntrepreneurPulse.com.au  Fact Sheet for Healthcare Providers: IncredibleEmployment.be  This test is not yet approved or cleared by the [place] FDA and has been authorized for detection and/or diagnosis of SARS-CoV-2 by FDA under an Emergency Use Authorization (EUA). This EUA will remain in effect (meaning this test can be used) for the duration of the COVID-19 declaration under Section 564(b)(1) of the Act, 21 U.S.C. section 360bbb-3(b)(1), unless the authorization is terminated or revoked.  Performed at [facility], [place] [address], [place], [place] [address]   Basic metabolic panel per protocol     Status: None   Collection Time: 11/13/20  1:07 PM  Result Value Ref Range   Sodium 139 135 - 145 mmol/L   Potassium 4.8 3.5 - 5.1 mmol/L   Chloride 105 98 - 111 mmol/L   CO2 26 22 - 32 mmol/L   Glucose, Bld 97 70 - 99 mg/dL    Comment: Glucose reference range applies only to samples  taken after fasting for at least 8 hours.   BUN 14 8 - 23 mg/dL   Creatinine, Ser 1.04 0.61 - 1.24 mg/dL   Calcium 9.7 8.9 - 10.3 mg/dL   GFR, Estimated >60 >60 mL/min    Comment: (NOTE) Calculated using the CKD-EPI Creatinine Equation (2021)    Anion gap 8 5 - 15    Comment: Performed at [facility] [address], [place], [place] [address]  CBC per protocol     Status: Abnormal   Collection Time: 11/13/20  1:07 PM  Result Value Ref Range   WBC 6.6 4.0 - 10.5 K/uL   RBC 4.15 (L) 4.22 - 5.81 MIL/uL   Hemoglobin 12.3 (L) 13.0 - 17.0 g/dL   HCT 39.8 39.0 - 52.0 %   MCV 95.9 80.0 - 100.0 fL   MCH 29.6 26.0 - 34.0 pg   MCHC 30.9 30.0 - 36.0 g/dL   RDW 14.6 11.5 - 15.5 %   Platelets 328 150 - 400 K/uL   nRBC 0.0 0.0 - 0.2 %    Comment: Performed at [facility], [place] [address] [place], [place] [address]  Aerobic/Anaerobic Culture (surgical/deep wound)     Status: None (Preliminary result)   Collection Time: 11/13/20  5:31 PM   Specimen: PATH Other; Tissue  Result Value Ref Range   Specimen Description TISSUE RIGHT HIP    Special Requests NONE    Gram Stain      ABUNDANT WBC PRESENT, PREDOMINANTLY PMN RARE GRAM POSITIVE COCCI Performed at [facility], [place] [address] [place], [place] [address]    Culture PENDING    Report Status PENDING   Aerobic/Anaerobic Culture (surgical/deep wound)     Status: None (Preliminary result)   Collection Time: 11/13/20  5:44 PM   Specimen: PATH Other; Tissue  Result Value Ref Range   Specimen Description WOUND RIGHT HIP    Special Requests NONE    Gram Stain      NO WBC SEEN FEW GRAM POSITIVE COCCI Performed at [facility], [place] [address], [place], [place] [address]    Culture PENDING    Report Status PENDING   Aerobic/Anaerobic Culture (surgical/deep wound)     Status: None (Preliminary result)   Collection Time: 11/13/20  5:56 PM   Specimen: PATH Other; Body Fluid  Result Value Ref Range   Specimen  Description SYNOVIAL JOINT FLUID RIGHT HIP    Special Requests NONE    Gram  Stain      MODERATE WBC PRESENT, PREDOMINANTLY MONONUCLEAR NO ORGANISMS SEEN Performed at [facility], [place] [address] [place], [place] [address]    Culture PENDING    Report Status PENDING   CBC     Status: Abnormal   Collection Time: 11/14/20  5:42 AM  Result Value Ref Range   WBC 7.6 4.0 - 10.5 K/uL   RBC 3.52 (L) 4.22 - 5.81 MIL/uL   Hemoglobin 10.5 (L) 13.0 - 17.0 g/dL   HCT 33.9 (L) 39.0 - 52.0 %   MCV 96.3 80.0 - 100.0 fL   MCH 29.8 26.0 - 34.0 pg   MCHC 31.0 30.0 - 36.0 g/dL   RDW 14.6 11.5 - 15.5 %   Platelets 300 150 - 400 K/uL   nRBC 0.0 0.0 - 0.2 %    Comment: Performed at [facility], [place] [address] [place], [place] [address]  Basic metabolic panel     Status: Abnormal   Collection Time: 11/14/20  5:42 AM  Result Value Ref Range   Sodium 137 135 - 145 mmol/L   Potassium 5.0 3.5 - 5.1 mmol/L   Chloride 106 98 - 111 mmol/L   CO2 25 22 - 32 mmol/L   Glucose, Bld 133 (H) 70 - 99 mg/dL    Comment: Glucose reference range applies only to samples taken after fasting for at least 8 hours.   BUN 15 8 - 23 mg/dL   Creatinine, Ser 1.11 0.61 - 1.24 mg/dL   Calcium 9.2 8.9 - 10.3 mg/dL   GFR, Estimated >60 >60 mL/min    Comment: (NOTE) Calculated using the CKD-EPI Creatinine Equation (2021)    Anion gap 6 5 - 15    Comment: Performed at [facility] [address] [place],  [address]    MICRO:  IMAGING: DG C-Arm 1-60 Min  Result Date: 11/13/2020 CLINICAL DATA:  Surgery, elective. Additional history provided: Irrigation and debridement right hip with possible head and poly exchange. Provided fluoroscopy time 5 seconds (0.57 mGy). EXAM: OPERATIVE right HIP (WITH PELVIS IF PERFORMED) 1 VIEWS TECHNIQUE: Fluoroscopic spot image(s) were submitted for interpretation post-operatively. COMPARISON:  Radiographs of the right hip 11/13/2020 FINDINGS: A single PA view  intraoperative fluoroscopic image of the right hip is submitted. Right total hip arthroplasty. The femoral and acetabular components appear well seated. Correlate with the procedural history. IMPRESSION: Single intraoperative fluoroscopic image of the right hip, as described. Electronically Signed   By: Kellie Simmering DO   On: 11/13/2020 18:16   DG HIP OPERATIVE UNILAT W OR W/O PELVIS RIGHT  Result Date: 11/13/2020 CLINICAL DATA:  Surgery, elective. Additional history provided: Irrigation and debridement right hip with possible head and poly exchange. Provided fluoroscopy time 5 seconds (0.57 mGy). EXAM: OPERATIVE right HIP (WITH PELVIS IF PERFORMED) 1 VIEWS TECHNIQUE: Fluoroscopic spot image(s) were submitted for interpretation post-operatively. COMPARISON:  Radiographs of the right hip 11/13/2020 FINDINGS: A single PA view intraoperative fluoroscopic image of the right hip is submitted. Right total hip arthroplasty. The femoral and acetabular components appear well seated. Correlate with the procedural history. IMPRESSION: Single intraoperative fluoroscopic image of the right hip, as described. Electronically Signed   By: Kellie Simmering DO   On: 11/13/2020 18:16   XR HIP UNILAT W OR W/O PELVIS 2-3 VIEWS RIGHT  Result Date: 11/13/2020 Stable right total hip replacement without complication.  [name] EKG SITE RITE  Result Date: 11/14/2020 If [facility] image not attached,  placement could not be confirmed due to current cardiac rhythm.   HISTORICAL MICRO/IMAGING  Assessment/Plan: CHRISHON MARTINO is a 65 y.o. male with PMH OSA, PVD, HTN HLD and a recent right hip replacement who presented to the hospital for a right hip washout due to concern for possible infection.   Patient is currently status post irrigation and debridement of right hip due to concern for prosthetic joint infection.  Currently on empirical antibiotics with vancomycin.  Intraoperative wound and tissue cultures pending.  He continues to be  afebrile with normal white count.  Patient will likely need long-term IV antibiotics therapy due to increased risk of infection in the prosthetic joint so we will place a PICC line now.  We will follow-up on culture results and de-escalate antibiotics regimen tomorrow.

## 2020-11-14 NOTE — Progress Notes (Signed)
Pt refusing cpap for the night. ?

## 2020-11-14 NOTE — Progress Notes (Signed)
Subjective:  1 Day Post-Op Procedure(s) (LRB): IRRIGATION AND DEBRIDEMENT RIGHT HIP (Right) Patient reports pain as mild.    Objective: Vital signs in last 24 hours: Temp:  [97 F (36.1 C)-98 F (36.7 C)] 97.5 F (36.4 C) (01/27 0800) Pulse Rate:  [53-70] 57 (01/27 0800) Resp:  [14-20] 17 (01/27 0800) BP: (110-162)/(56-74) 120/69 (01/27 0800) SpO2:  [95 %-100 %] 98 % (01/27 0800) Weight:  [118.4 kg] 118.4 kg (01/26 1258)  Intake/Output from previous day: 01/26 0701 - 01/27 0700 In: 2720 [P.O.:1020; I.V.:1300; IV Piggyback:400] Out: 50 [Blood:50] Intake/Output this shift: No intake/output data recorded.  Recent Labs    11/13/20 1307 11/13/20 1441 11/14/20 0542  HGB 12.3* 13.9 10.5*   Recent Labs    11/13/20 1441 11/14/20 0542  WBC 7.5 7.6  RBC 4.50 3.52*  HCT 42.5 33.9*  PLT 402* 300   Recent Labs    11/13/20 1307 11/14/20 0542  NA 139 137  K 4.8 5.0  CL 105 106  CO2 26 25  BUN 14 15  CREATININE 1.04 1.11  GLUCOSE 97 133*  CALCIUM 9.7 9.2   No results for input(s): LABPT, INR in the last 72 hours.  Neurologically intact Neurovascular intact Sensation intact distally Intact pulses distally Dorsiflexion/Plantar flexion intact Compartment soft Wound vac in place and functioning without any fluid in canister  Assessment/Plan: 1 Day Post-Op Procedure(s) (LRB): IRRIGATION AND DEBRIDEMENT RIGHT HIP (Right) Up with therapy Continue ABX therapy due to Post-op infection  WBAT RLE Continue with vancomycin for now [facility] to restart ASA Intra-op cultures pending Will await culture results to determine if picc line needed      Sean Gardner 11/14/2020, 8:10 AM

## 2020-11-14 NOTE — Progress Notes (Signed)
Pharmacy Antibiotic Note  Sean Gardner is a 65 y.o. male s/p recent THA who presented on 11/13/2020 with surgical site infection with concern for deep prosthetic joint.  Pharmacy has been consulted for Vancomycin dosing.   SCr trended up to 1.11, will adjust the Vancomycin dose today. Will continue to monitor intra-op cultures for narrowing.  Plan: - Adjust Vancomycin to 1g IV every 12 hours (est AUC 527, SCr 1.11, Vd 0.5) - Will continue to follow renal function, culture results, LOT, and antibiotic de-escalation plans   Height: 6' (182.9 cm) Weight: 118.4 kg (261 lb) IBW/kg (Calculated) : 77.6  Temp (24hrs), Avg:97.6 F (36.4 C), Min:97 F (36.1 C), Max:98 F (36.7 C)  Recent Labs  Lab 11/13/20 1307 11/13/20 1441 11/14/20 0542  WBC 6.6 7.5 7.6  CREATININE 1.04  --  1.11    Estimated Creatinine Clearance: 86.9 mL/min (by C-G formula based on SCr of 1.11 mg/dL).    Allergies  Allergen Reactions  . Lisinopril Other (See Comments)    Hyperkalemia  . Zolpidem Tartrate Other (See Comments)    Felt fuzzy and hallucinations    Antimicrobials this admission: Vanc 1/26 >> Cefazolin 1/26 >>  Microbiology results: 1/26 Fluvid >> neg 1/26 Wound R-hip tissue cx >> few GPC 1/26 R-hip synovial fluid >>  Thank you for allowing pharmacy to be a part of this patient's care.  Alycia Rossetti, PharmD, BCPS Clinical Pharmacist Clinical phone for 11/14/2020: [phone] 11/14/2020 8:05 AM   **Pharmacist phone directory can now be found on [email or web address] ([email or web address]).  Listed under [place].

## 2020-11-14 NOTE — Evaluation (Signed)
Physical Therapy Evaluation Patient Details Name: Sean Gardner MRN: [id number] DOB: 1956-02-13 Today's Date: 11/14/2020   History of Present Illness  Pt is a 65 y/o male s/p R THA, direct anterior. PMH inlcudes HTN, lung cancer, PVD and s/p CABG.   Clinical Impression  Patient evaluated by Physical Therapy with no further acute PT needs identified. All education has been completed and the patient has no further questions. At the time of PT eval pt was able to demonstrate transfers and ambulation with modified independence to independence and no AD. Pt able to recall HEP of post-op exercise from initial surgery, and is safe to ambulate in the hall without assist and manage IV pole with staff assisting in unplugging VAC and IV pole first. Pt is appropriate to resume outpatient PT upon d/c. See below for any follow-up Physical Therapy or equipment needs. PT is signing off. Thank you for this referral.     Follow Up Recommendations Outpatient PT (Resume services that were already in place)    Equipment Recommendations  None recommended by PT    Recommendations for Other Services       Precautions / Restrictions Precautions Precautions: Other (comment) (Direct anterior hip, no precautions) Restrictions Weight Bearing Restrictions: Yes RUE Weight Bearing: Weight bearing as tolerated      Mobility  Bed Mobility               General bed mobility comments: Pt was received EOB with nursing staff present    Transfers Overall transfer level: Independent Equipment used: None                Ambulation/Gait Ambulation/Gait assistance: Independent Social research officer, government (Feet): 400 Feet Assistive device: None;IV Pole Gait Pattern/deviations: WFL(Within Functional Limits) Gait velocity: Decreased Gait velocity interpretation: 1.31 - 2.62 ft/sec, indicative of limited community ambulator General Gait Details: Initially with IV pole, progressing to no AD and therapist managing IV pole.  No balance deficits noted.  Stairs         General stair comments: Pt declined stair training. States he has been independent with stairs at home and does not feel he needs to practice.  Wheelchair Mobility    Modified Rankin (Stroke Patients Only)       Balance Overall balance assessment: Modified Independent                                           Pertinent Vitals/Pain Pain Assessment: No/denies pain    Home Living Family/patient expects to be discharged to:: Private residence Living Arrangements: Spouse/significant other;Other relatives Available Help at Discharge: Family Type of Home: House Home Access: Stairs to enter Entrance Stairs-Rails: None Entrance Stairs-Number of Steps: 2-3 Home Layout: One level Home Equipment: Environmental consultant - 2 wheels      Prior Function Level of Independence: Independent               Hand Dominance        Extremity/Trunk Assessment   Upper Extremity Assessment Upper Extremity Assessment: Overall WFL for tasks assessed    Lower Extremity Assessment Lower Extremity Assessment: RLE deficits/detail RLE Deficits / Details: Wound VAC placed. Mild weakness consistent with above mentioned surgery.    Cervical / Trunk Assessment Cervical / Trunk Assessment: Normal  Communication   Communication: No difficulties  Cognition Arousal/Alertness: Awake/alert Behavior During Therapy: WFL for tasks assessed/performed Overall Cognitive Status: Within Functional Limits  for tasks assessed                                        General Comments      Exercises Other Exercises Other Exercises: Verbally reviewed HEP and pt recalled post-op exercise from initial surgery.   Assessment/Plan    PT Assessment Patent does not need any further PT services  PT Problem List         PT Treatment Interventions      PT Goals (Current goals can be found in the Care Plan section)  Acute Rehab PT  Goals Patient Stated Goal: Get back to bowling on his bowling league PT Goal Formulation: All assessment and education complete, DC therapy    Frequency     Barriers to discharge        Co-evaluation               AM-PAC PT "6 Clicks" Mobility  Outcome Measure Help needed turning from your back to your side while in a flat bed without using bedrails?: None Help needed moving from lying on your back to sitting on the side of a flat bed without using bedrails?: None Help needed moving to and from a bed to a chair (including a wheelchair)?: None Help needed standing up from a chair using your arms (e.g., wheelchair or bedside chair)?: None Help needed to walk in hospital room?: None Help needed climbing 3-5 steps with a railing? : None 6 Click Score: 24    End of Session   Activity Tolerance: Patient tolerated treatment well Patient left: in chair;with call bell/phone within reach Nurse Communication: Mobility status PT Visit Diagnosis: Unsteadiness on feet (R26.81);Difficulty in walking, not elsewhere classified (R26.2)    Time: 2947-6546 PT Time Calculation (min) (ACUTE ONLY): 23 min   Charges:   PT Evaluation $PT Eval Low Complexity: 1 Low PT Treatments $Gait Training: 8-22 mins        Rolinda Roan, PT, DPT Acute Rehabilitation Services Pager: [phone] Office: [phone]   Thelma Comp 11/14/2020, 11:23 AM

## 2020-11-15 DIAGNOSIS — T8149XA Infection following a procedure, other surgical site, initial encounter: Secondary | ICD-10-CM | POA: Diagnosis not present

## 2020-11-15 DIAGNOSIS — W1830XA Fall on same level, unspecified, initial encounter: Secondary | ICD-10-CM | POA: Diagnosis not present

## 2020-11-15 DIAGNOSIS — A4901 Methicillin susceptible Staphylococcus aureus infection, unspecified site: Secondary | ICD-10-CM

## 2020-11-15 DIAGNOSIS — A499 Bacterial infection, unspecified: Secondary | ICD-10-CM

## 2020-11-15 LAB — CK: Total CK: 129 U/L (ref 49–397)

## 2020-11-15 MED ORDER — CHLORHEXIDINE GLUCONATE CLOTH 2 % EX PADS
6.0000 | MEDICATED_PAD | Freq: Every day | CUTANEOUS | Status: DC
  Administered 2020-11-15 – 2020-11-18 (×4): 6 via TOPICAL

## 2020-11-15 MED ORDER — RIFAMPIN 300 MG PO CAPS
300.0000 mg | ORAL_CAPSULE | Freq: Two times a day (BID) | ORAL | Status: DC
  Administered 2020-11-15 – 2020-11-18 (×6): 300 mg via ORAL
  Filled 2020-11-15 (×7): qty 1

## 2020-11-15 MED ORDER — SODIUM CHLORIDE 0.9% FLUSH: 10.0000 mL | INTRAVENOUS | Status: DC | PRN

## 2020-11-15 MED ORDER — SODIUM CHLORIDE 0.9 % IV SOLN
940.0000 mg | Freq: Every day | INTRAVENOUS | Status: DC
  Administered 2020-11-15 – 2020-11-17 (×3): 940 mg via INTRAVENOUS
  Filled 2020-11-15 (×4): qty 18.8

## 2020-11-15 NOTE — Progress Notes (Signed)
Peripherally Inserted Central Catheter Placement  The IV Nurse has discussed with the patient and/or persons authorized to consent for the patient, the purpose of this procedure and the potential benefits and risks involved with this procedure.  The benefits include less needle sticks, lab draws from the catheter, and the patient may be discharged home with the catheter. Risks include, but not limited to, infection, bleeding, blood clot (thrombus formation), and puncture of an artery; nerve damage and irregular heartbeat and possibility to perform a PICC exchange if needed/ordered by physician.  Alternatives to this procedure were also discussed.  Bard Power PICC patient education guide, fact sheet on infection prevention and patient information card has been provided to patient /or left at bedside.    PICC Placement Documentation    Placed PICC arm in the right upper arm.Left arm is swollen from PIV infiltration.    Sean Gardner 11/15/2020, 11:49 AM

## 2020-11-15 NOTE — Progress Notes (Signed)
Pharmacy Antibiotic Note  Sean Gardner is a 65 y.o. male s/p recent THA who presented on 11/13/2020 with surgical site infection with concern for deep prosthetic joint.  Patient will be getting his medications paid for through the [place] so we will switch him over to daptomycin. Ortho plans to take back to OR for poly exchange if the joint fluid turns positive, neg x48 hours as of now. Considering sending patient home this weekend on orals vs long-acting injectable to bridge him until home health can get his medications delivered. ID will see over the weekend and monitor cultures to make this decision.   Plan: - Stop vancomycin - Start Daptoymcin 940mg  IV daily 8mg /kg - Start rifampin  - Baseline CK - F/u cultures for de-escalation   Height: 6' (182.9 cm) Weight: 118.4 kg (261 lb) IBW/kg (Calculated) : 77.6  Temp (24hrs), Avg:97.7 F (36.5 C), Min:97.6 F (36.4 C), Max:98 F (36.7 C)  Recent Labs  Lab 11/13/20 1307 11/13/20 1441 11/14/20 0542  WBC 6.6 7.5 7.6  CREATININE 1.04  --  1.11    Estimated Creatinine Clearance: 86.9 mL/min (by C-G formula based on SCr of 1.11 mg/dL).    Allergies  Allergen Reactions  . Lisinopril Other (See Comments)    Hyperkalemia  . Zolpidem Tartrate Other (See Comments)    Felt fuzzy and hallucinations    Antimicrobials this admission: Vanc 1/26 >>1/28 Cefazolin 1/26 >> Daptomycin 1/28>> Rifampin >>  Microbiology results: 1/26 Fluvid >> neg 1/26 Wound R-hip tissue cx >> staph aureus  1/26 R-hip synovial fluid >>  Thank you for allowing pharmacy to be a part of this patient's care.  Nicoletta Dress, PharmD, BCIDP Infectious Disease Pharmacist  Phone: [phone] 11/15/2020 3:30 PM

## 2020-11-15 NOTE — Progress Notes (Signed)
[facility] for Infectious Disease    Date of Admission:  11/13/2020   Total days of antibiotics 3           ID: Sean Gardner is a 65 y.o. male with MSSA  Principal Problem:   Postoperative wound infection of right hip    Subjective: Afebrile. Ambulated 4 x yesterday. His right hip feeling better. Had picc line placed in right upper extremity without difficulty.  Medications:  . amLODipine  5 mg Oral BID  . aspirin EC  325 mg Oral Daily  . Chlorhexidine Gluconate Cloth  6 each Topical Daily  . clopidogrel  75 mg Oral QAC breakfast  . docusate sodium  100 mg Oral BID  . hydrALAZINE  25 mg Oral TID  . melatonin  3 mg Oral QHS  . metoprolol tartrate  50 mg Oral BID  . oxyCODONE  10 mg Oral Q12H  . pantoprazole  40 mg Oral Daily  . polyethylene glycol  17 g Oral Daily  . rifampin  300 mg Oral Q12H    Objective: Vital signs in last 24 hours: Temp:  [97.6 F (36.4 C)-98 F (36.7 C)] 97.6 F (36.4 C) (01/28 0730) Pulse Rate:  [55-108] 108 (01/28 0730) Resp:  [17-20] 17 (01/28 0730) BP: (123-139)/(60-83) 123/71 (01/28 0730) SpO2:  [92 %-100 %] 92 % (01/28 0730)  Constitutional: He is oriented to person, place, and time. He appears well-developed and well-nourished. No distress.  HENT:  Mouth/Throat: Oropharynx is clear and moist. No oropharyngeal exudate.  Cardiovascular: Normal rate, regular rhythm and normal heart sounds. Exam reveals no gallop and no friction rub.  No murmur heard.  Pulmonary/Chest: Effort normal and breath sounds normal. No respiratory distress. He has no wheezes.  Ext: right UE - PICC line c/d/i; right thigh-induration slightly less. Bandaged intact. Lymphadenopathy:  He has no cervical adenopathy.  Neurological: He is alert and oriented to person, place, and time.  Skin: Skin is warm and dry. No rash noted. No erythema.  Psychiatric: He has a normal mood and affect. His behavior is normal.     Lab Results Recent Labs    11/13/20 1307  11/13/20 1441 11/14/20 0542  WBC 6.6 7.5 7.6  HGB 12.3* 13.9 10.5*  HCT 39.8 42.5 33.9*  NA 139  --  137  K 4.8  --  5.0  CL 105  --  106  CO2 26  --  25  BUN 14  --  15  CREATININE 1.04  --  1.11   Liver Panel No results for input(s): PROT, ALBUMIN, AST, ALT, ALKPHOS, BILITOT, BILIDIR, IBILI in the last 72 hours. Sedimentation Rate Recent Labs    11/13/20 1441  ESRSEDRATE 80*   C-Reactive Protein Recent Labs    11/13/20 1441  CRP 75.7*    Microbiology: Tissue cultures MSSA Joint culture= WBC, cx still NGTD at 40hr Studies/Results: DG C-Arm 1-60 Min  Result Date: 11/13/2020 CLINICAL DATA:  Surgery, elective. Additional history provided: Irrigation and debridement right hip with possible head and poly exchange. Provided fluoroscopy time 5 seconds (0.57 mGy). EXAM: OPERATIVE right HIP (WITH PELVIS IF PERFORMED) 1 VIEWS TECHNIQUE: Fluoroscopic spot image(s) were submitted for interpretation post-operatively. COMPARISON:  Radiographs of the right hip 11/13/2020 FINDINGS: A single PA view intraoperative fluoroscopic image of the right hip is submitted. Right total hip arthroplasty. The femoral and acetabular components appear well seated. Correlate with the procedural history. IMPRESSION: Single intraoperative fluoroscopic image of the right hip, as described.  Electronically Signed   By: Kellie Simmering DO   On: 11/13/2020 18:16   DG HIP OPERATIVE UNILAT W OR W/O PELVIS RIGHT  Result Date: 11/13/2020 CLINICAL DATA:  Surgery, elective. Additional history provided: Irrigation and debridement right hip with possible head and poly exchange. Provided fluoroscopy time 5 seconds (0.57 mGy). EXAM: OPERATIVE right HIP (WITH PELVIS IF PERFORMED) 1 VIEWS TECHNIQUE: Fluoroscopic spot image(s) were submitted for interpretation post-operatively. COMPARISON:  Radiographs of the right hip 11/13/2020 FINDINGS: A single PA view intraoperative fluoroscopic image of the right hip is submitted. Right total  hip arthroplasty. The femoral and acetabular components appear well seated. Correlate with the procedural history. IMPRESSION: Single intraoperative fluoroscopic image of the right hip, as described. Electronically Signed   By: Kellie Simmering DO   On: 11/13/2020 18:16   [name] EKG SITE RITE  Result Date: 11/14/2020 If Site Rite image not attached, placement could not be confirmed due to current cardiac rhythm.    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 Will wait for cultures for the next 48hr through Sunday to see if any growth on joint fluid that would required I X D and poly-exchange  If all is negative on Sunday/monday, can discharge on the current regimen  [facility] is [facility] to get [facility] on abtx regimen. Anticipate to get approval on Monday.  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 Lisinopril Other (See Comments)    Hyperkalemia  . Zolpidem Tartrate Other (See Comments)    Felt fuzzy and hallucinations    OPAT Orders Discharge antibiotics to be given via PICC line Discharge antibiotics: Per pharmacy protocol daptomycin at 32m41mg/day  Duration: 6 wk End Date: 3/9  [facility] Protocol:  Home health RN for IV administration and teaching; PICC line care and labs.    Labs weekly while on IV antibiotics: _x_ CBC with differential _x_ BMP  _x_ CRP _x_ ESR _x_ CK  _x_ Please pull PIC at completion of IV antibiotics   Fax weekly labs to [phone] Follow Up Appt: 4 wk  @ [place] J[facility]ari Pelc   [facility] Infect[facility] Cell: [phone]:  [phone]  11/15/2020, 3:20 PM

## 2020-11-15 NOTE — Progress Notes (Signed)
Subjective: 2 Days Post-Op Procedure(s) (LRB): IRRIGATION AND DEBRIDEMENT RIGHT HIP (Right) Patient reports pain as mild.    Objective: Vital signs in last 24 hours: Temp:  [97.6 F (36.4 C)-98 F (36.7 C)] 97.6 F (36.4 C) (01/28 0730) Pulse Rate:  [55-108] 108 (01/28 0730) Resp:  [17-20] 17 (01/28 0730) BP: (123-139)/(60-83) 123/71 (01/28 0730) SpO2:  [92 %-100 %] 92 % (01/28 0730)  Intake/Output from previous day: No intake/output data recorded. Intake/Output this shift: No intake/output data recorded.  Recent Labs    11/13/20 1307 11/13/20 1441 11/14/20 0542  HGB 12.3* 13.9 10.5*   Recent Labs    11/13/20 1441 11/14/20 0542  WBC 7.5 7.6  RBC 4.50 3.52*  HCT 42.5 33.9*  PLT 402* 300   Recent Labs    11/13/20 1307 11/14/20 0542  NA 139 137  K 4.8 5.0  CL 105 106  CO2 26 25  BUN 14 15  CREATININE 1.04 1.11  GLUCOSE 97 133*  CALCIUM 9.7 9.2   No results for input(s): LABPT, INR in the last 72 hours.  Neurologically intact Neurovascular intact Sensation intact distally Intact pulses distally Dorsiflexion/Plantar flexion intact Compartment soft  Wound vac in place and functioning without any drainage in canister   Assessment/Plan: 2 Days Post-Op Procedure(s) (LRB): IRRIGATION AND DEBRIDEMENT RIGHT HIP (Right) Up with therapy Continue ABX therapy due to Post-op infection  WBAT RLE Continue wound vac (will remove prior to d/c) Tissue growing gram positive cocci in pairs.  Awaiting cx.  Will then consult ID and order picc line. Continue vancomycin for now    Aundra Dubin 11/15/2020, 8:22 AM

## 2020-11-16 NOTE — Progress Notes (Signed)
Subjective: 3 Days Post-Op Procedure(s) (LRB): IRRIGATION AND DEBRIDEMENT RIGHT HIP (Right) Patient reports pain as mild.  He otherwise has no significant complaints.  Objective: Vital signs in last 24 hours: Temp:  [97.4 F (36.3 C)-97.6 F (36.4 C)] 97.6 F (36.4 C) (01/29 0810) Pulse Rate:  [49-55] 55 (01/29 0810) Resp:  [17-20] 17 (01/29 0810) BP: (112-136)/(71-76) 112/71 (01/29 0810) SpO2:  [99 %-100 %] 99 % (01/29 0810)  Intake/Output from previous day: No intake/output data recorded. Intake/Output this shift: No intake/output data recorded.  Recent Labs    11/13/20 1307 11/13/20 1441 11/14/20 0542  HGB 12.3* 13.9 10.5*   Recent Labs    11/13/20 1441 11/14/20 0542  WBC 7.5 7.6  RBC 4.50 3.52*  HCT 42.5 33.9*  PLT 402* 300   Recent Labs    11/13/20 1307 11/14/20 0542  NA 139 137  K 4.8 5.0  CL 105 106  CO2 26 25  BUN 14 15  CREATININE 1.04 1.11  GLUCOSE 97 133*  CALCIUM 9.7 9.2   No results for input(s): LABPT, INR in the last 72 hours.  He is sitting at the bedside comfortably. His right operative hip is stable. His incisional VAC is intact with a good seal.   Assessment/Plan: 3 Days Post-Op Procedure(s) (LRB): IRRIGATION AND DEBRIDEMENT RIGHT HIP (Right)  The plan is to see what the final cultures and susceptibilities show for long-term IV antibiotic planning.  Apparently, there is also some type of approval letter will be needed from the [place] system.     Mcarthur Rossetti 11/16/2020, 8:36 AM

## 2020-11-17 DIAGNOSIS — B9561 Methicillin susceptible Staphylococcus aureus infection as the cause of diseases classified elsewhere: Secondary | ICD-10-CM | POA: Diagnosis not present

## 2020-11-17 DIAGNOSIS — T8451XA Infection and inflammatory reaction due to internal right hip prosthesis, initial encounter: Secondary | ICD-10-CM

## 2020-11-17 NOTE — Progress Notes (Signed)
Pharmacy Antibiotic Note  Sean Gardner is a 65 y.o. male s/p recent THA who presented on 11/13/2020 with surgical site infection with concern for deep prosthetic joint.  Patient will be getting his medications paid for through the [place] so we will switch him over to daptomycin. Ortho plans to take back to OR for poly exchange if the joint fluid turns positive, neg x72 hours as of now. Per ID, sending patient home with 1 week of minocycline for MRSA coverage  to bridge him until home health can get his medications delivered.   Cultures grew MRSA, so ID treating with daptomycin through 12/25/20 for 6 weeks. ID also continuing rifampin 300mg  BID at this time due to concerns for prosthetic joint infection. Of note, multiple DDI with patient's home medications - namely an increased bleeding risk with clopidogrel. Pharmacy to monitor interactions and s/s bleeding. Rifampin discharge order pended; daptomycin OPAT order in. Continue current dosing; baseline CK 129.  Plan: - Start Daptoymcin 940mg  IV daily (8mg /kg) - Continue rifampin 300mg  BID - F/u [facility] approval; patient to receive minocycline PO on discharge pending approval  Height: 6' (182.9 cm) Weight: 118.4 kg (261 lb) IBW/kg (Calculated) : 77.6  Temp (24hrs), Avg:97.8 F (36.6 C), Min:97.8 F (36.6 C), Max:97.8 F (36.6 C)  Recent Labs  Lab 11/13/20 1307 11/13/20 1441 11/14/20 0542  WBC 6.6 7.5 7.6  CREATININE 1.04  --  1.11    Estimated Creatinine Clearance: 86.9 mL/min (by C-G formula based on SCr of 1.11 mg/dL).    Allergies  Allergen Reactions  . Lisinopril Other (See Comments)    Hyperkalemia  . Zolpidem Tartrate Other (See Comments)    Felt fuzzy and hallucinations    Antimicrobials this admission: Vanc 1/26 >>1/28 Cefazolin 1/26 x1 Daptomycin 1/28>> Rifampin 1/28>>  Microbiology results: 1/26 Fluvid >> neg 1/26 Wound R-hip tissue cx >> MRSA 1/26 R-hip synovial fluid >>  Thank you for allowing pharmacy to be  a part of this patient's care.  Alfonse Spruce, PharmD PGY2 ID Pharmacy Resident Phone between 7 am - 3:30 pm: [phone]  Please check AMION for all [place] phone numbers After 10:00 PM, call [place] [phone]  11/17/2020 10:53 AM

## 2020-11-17 NOTE — Progress Notes (Signed)
Patient ID: Sean Gardner, male   DOB: [birth year], 65 y.o.   MRN: [id number] The patient is comfortable this morning.  He sit in a chair and eating.  He has been ambulating around the room as well.  His right hip incisional VAC is intact with a good seal.  He understands that we are waiting for final antibiotic approval through the [facility] in his insurance.  Dr. Erlinda Hong will be back tomorrow to remove the incisional VAC.  Hopefully disposition will be by tomorrow.

## 2020-11-17 NOTE — Progress Notes (Signed)
PHARMACY CONSULT NOTE FOR:  OUTPATIENT  PARENTERAL ANTIBIOTIC THERAPY (OPAT)  Indication: MRSA deep tissue surgical site infection Regimen: Daptomycin 940mg  once daily x 6 weeks End date: 12/25/20  IV antibiotic discharge orders are pended. To discharging provider:  please sign these orders via discharge navigator,  Select New Orders & click on the button choice - Manage This Unsigned Work.     Thank you for allowing pharmacy to be a part of this patient's care.  Esmond Plants 11/17/2020, 10:22 AM

## 2020-11-17 NOTE — Progress Notes (Addendum)
[facility]  Date of Admission:  11/13/2020      ASSESSMENT: Surgical site infection S/p right hip arthroplasty  Culture Result:  1/26 I&D wound cx MRSA  1/26 right hip joint aspirate cx ngtd  While potentially this is all superficial surgical infection, it is tricky. Given this is staph aureus, would be wary of potential deeper involvement. Often, prosthetic joint culture could be negative.  We'll follow clinically and along with inflammatory markers and decide final abx duration in clinic  Opat is setup along with outpatient ID appointment as below  I am ok with him going discharging oral minocycline/rifampin for a few days until IV is setup/approved. I do not anticipate this to take more than a few days    Allergies  Allergen Reactions  . Lisinopril Other (See Comments)    Hyperkalemia  . Zolpidem Tartrate Other (See Comments)    Felt fuzzy and hallucinations    OPAT Orders Discharge antibiotics to be given via PICC line OPAT Order Details            .Outpatient Parenteral Antibiotic Therapy Consult  Until discontinued       Provider:  (Not yet assigned)  Question Answer Comment  Antibiotic Daptomycin (Cubicin) IVPB   Indications for use MRSA surgical site infection   End Date 12/25/2020              Patient will also take rifampin 300 mg po bid for the duration of the IV abx He'll be given minocycline 100 mg PO bid for a week (to take until [place] has setup his IV abx)  Duration: Tentatively planned 6 weeks, but dr [place] Flattery on f/u clinic visit will determine final duration  End Date: 12/25/2020  PICC Care Per Protocol: Home health RN for IV administration and teaching; PICC line care and labs.    Labs weekly while on IV antibiotics: _x_ CBC with differential __ BMP _x_ CMP _x_ CRP __ ESR __ Vancomycin trough _x_ CK  _x_ Please pull PIC at completion of IV antibiotics __ Please leave PIC in place until doctor has  seen patient or been notified  Fax weekly labs to [phone]  Clinic Follow Up Appt: 2/23 @ 3pm with Dr Marquette Saa Address: [address], [place], [place] [address] Phone: [phone]   Principal Problem:   Postoperative wound infection of right hip Active Problems:   Staphylococcus aureus infection   Scheduled Meds: . amLODipine  5 mg Oral BID  . aspirin EC  325 mg Oral Daily  . Chlorhexidine Gluconate Cloth  6 each Topical Daily  . clopidogrel  75 mg Oral QAC breakfast  . docusate sodium  100 mg Oral BID  . hydrALAZINE  25 mg Oral TID  . melatonin  3 mg Oral QHS  . metoprolol tartrate  50 mg Oral BID  . oxyCODONE  10 mg Oral Q12H  . pantoprazole  40 mg Oral Daily  . polyethylene glycol  17 g Oral Daily  . rifampin  300 mg Oral Q12H   Continuous Infusions: . sodium chloride    . DAPTOmycin (CUBICIN)  IV 940 mg (11/16/20 8341)  . methocarbamol (ROBAXIN) IV     PRN Meds:.acetaminophen, alum & mag hydroxide-simeth, diphenhydrAMINE, HYDROmorphone (DILAUDID) injection, magnesium citrate, melatonin, menthol-cetylpyridinium **OR** phenol, methocarbamol **OR** methocarbamol (ROBAXIN) IV, metoCLOPramide **OR** metoCLOPramide (REGLAN) injection, ondansetron **OR** ondansetron (ZOFRAN) IV, oxyCODONE, oxyCODONE, sodium chloride flush, sorbitol   SUBJECTIVE:  Doing well Pending disposition and [facility] approval of opat No n/v/f/c/diarrhea  Hip aspirate cx ngtd Wound cx mrsa  Review of Systems: ROS Other ros negative  Allergies  Allergen Reactions  . Lisinopril Other (See Comments)    Hyperkalemia  . Zolpidem Tartrate Other (See Comments)    Felt fuzzy and hallucinations    OBJECTIVE: Vitals:   11/16/20 2014 11/17/20 0425 11/17/20 0813 11/17/20 1136  BP: (!) 142/70 133/67 (!) 143/78 123/77  Pulse: 68 63 63 (!) 53  Resp: _0 Temp: 97.8 F (36.6 C) 97.8 F (36.6 C) 97.8 F (36.6 C) (!) 97.4 F (36.3 C)  TempSrc: Oral Oral Oral Oral  SpO2: 100%  97% 97% 99%  Weight:      Height:       Body mass index is 35.4 kg/m.  Physical Exam Constitutional:no distress, well appearing HEENT: atraumatic, oropharynx clear Cardiovascular: RRR no mrg Pulmonary/Chest: normal respiratory effort; cta Ext: RUE picc site no erythema/fluctuance/tenderness MSK: right hip wound vac in place; slight edema/swelling around the wound but no tenderness Neurological: nonfocal Skin: no rash Psychiatric: alert/oriented  Lab Results Lab Results  Component Value Date   WBC 7.6 11/14/2020   HGB 10.5 (L) 11/14/2020   HCT 33.9 (L) 11/14/2020   MCV 96.3 11/14/2020   PLT 300 11/14/2020    Lab Results  Component Value Date   CREATININE 1.11 11/14/2020   BUN 15 11/14/2020   NA 137 11/14/2020   K 5.0 11/14/2020   CL 106 11/14/2020   CO2 25 11/14/2020    Lab Results  Component Value Date   ALT 29 09/05/2020   AST 29 09/05/2020   ALKPHOS 46 09/05/2020   BILITOT 0.5 09/05/2020     Microbiology: Recent Results (from the past 240 hour(s))  Resp Panel by RT-PCR (Flu A&B, Covid) Nasopharyngeal Swab     Status: None   Collection Time: 11/13/20 12:51 PM   Specimen: Nasopharyngeal Swab; Nasopharyngeal(NP) swabs in vial transport medium  Result Value Ref Range Status   SARS Coronavirus 2 by RT PCR NEGATIVE NEGATIVE Final    Comment: (NOTE) SARS-CoV-2 target nucleic acids are NOT DETECTED.  The SARS-CoV-2 RNA is generally detectable in upper respiratory specimens during the acute phase of infection. The lowest concentration of SARS-CoV-2 viral copies this assay can detect is 138 copies/mL. A negative result does not preclude SARS-Cov-2 infection and should not be used as the sole basis for treatment or other patient management decisions. A negative result may occur with  improper specimen collection/handling, submission of specimen other than nasopharyngeal swab, presence of viral mutation(s) within the areas targeted by this assay, and inadequate  number of viral copies(<138 copies/mL). A negative result must be combined with clinical observations, patient history, and epidemiological information. The expected result is Negative.  Fact Sheet for Patients:  EntrepreneurPulse.com.au  Fact Sheet for Healthcare Providers:  IncredibleEmployment.be  This test is no t yet approved or cleared by the [place] FDA and  has been authorized for detection and/or diagnosis of SARS-CoV-2 by FDA under an Emergency Use Authorization (EUA). This EUA will remain  in effect (meaning this test can be used) for the duration of the COVID-19 declaration under Section 564(b)(1) of the Act, 21 U.S.C.section 360bbb-3(b)(1), unless the authorization is terminated  or revoked sooner.       Influenza A by PCR NEGATIVE NEGATIVE Final   Influenza B by PCR NEGATIVE NEGATIVE Final    Comment: (NOTE) The Xpert Xpress SARS-CoV-2/FLU/RSV plus  assay is intended as an aid in the diagnosis of influenza from Nasopharyngeal swab specimens and should not be used as a sole basis for treatment. Nasal washings and aspirates are unacceptable for Xpert Xpress SARS-CoV-2/FLU/RSV testing.  Fact Sheet for Patients: EntrepreneurPulse.com.au  Fact Sheet for Healthcare Providers: IncredibleEmployment.be  This test is not yet approved or cleared by the [place] FDA and has been authorized for detection and/or diagnosis of SARS-CoV-2 by FDA under an Emergency Use Authorization (EUA). This EUA will remain in effect (meaning this test can be used) for the duration of the COVID-19 declaration under Section 564(b)(1) of the Act, 21 U.S.C. section 360bbb-3(b)(1), unless the authorization is terminated or revoked.  Performed at [facility], [place] [address] [place], [place] [address]   Aerobic/Anaerobic Culture (surgical/deep wound)     Status: None (Preliminary result)   Collection Time:  11/13/20  5:31 PM   Specimen: PATH Other; Tissue  Result Value Ref Range Status   Specimen Description TISSUE RIGHT HIP  Final   Special Requests NONE  Final   Gram Stain   Final    ABUNDANT WBC PRESENT, PREDOMINANTLY PMN RARE GRAM POSITIVE COCCI Performed at [facility], [place] [address] [place], [place] [address]    Culture   Final    FEW METHICILLIN RESISTANT STAPHYLOCOCCUS AUREUS NO ANAEROBES ISOLATED; CULTURE IN PROGRESS FOR 5 DAYS    Report Status PENDING  Incomplete   Organism ID, Bacteria METHICILLIN RESISTANT STAPHYLOCOCCUS AUREUS  Final      Susceptibility   Methicillin resistant staphylococcus aureus - MIC*    CIPROFLOXACIN <=0.5 SENSITIVE Sensitive     ERYTHROMYCIN >=8 RESISTANT Resistant     GENTAMICIN <=0.5 SENSITIVE Sensitive     OXACILLIN >=4 RESISTANT Resistant     TETRACYCLINE <=1 SENSITIVE Sensitive     VANCOMYCIN 1 SENSITIVE Sensitive     TRIMETH/SULFA <=10 SENSITIVE Sensitive     CLINDAMYCIN <=0.25 SENSITIVE Sensitive     RIFAMPIN <=0.5 SENSITIVE Sensitive     Inducible Clindamycin NEGATIVE Sensitive     * FEW METHICILLIN RESISTANT STAPHYLOCOCCUS AUREUS  Aerobic/Anaerobic Culture (surgical/deep wound)     Status: None (Preliminary result)   Collection Time: 11/13/20  5:44 PM   Specimen: PATH Other; Tissue  Result Value Ref Range Status   Specimen Description WOUND RIGHT HIP  Final   Special Requests NONE  Final   Gram Stain   Final    NO WBC SEEN FEW GRAM POSITIVE COCCI Performed at [facility], [address] [place], [place] [address]    Culture   Final    RARE METHICILLIN RESISTANT STAPHYLOCOCCUS AUREUS NO ANAEROBES ISOLATED; CULTURE IN PROGRESS FOR 5 DAYS    Report Status PENDING  Incomplete   Organism ID, Bacteria METHICILLIN RESISTANT STAPHYLOCOCCUS AUREUS  Final      Susceptibility   Methicillin resistant staphylococcus aureus - MIC*    CIPROFLOXACIN <=0.5 SENSITIVE Sensitive     ERYTHROMYCIN >=8 RESISTANT Resistant      GENTAMICIN <=0.5 SENSITIVE Sensitive     OXACILLIN >=4 RESISTANT Resistant     TETRACYCLINE <=1 SENSITIVE Sensitive     VANCOMYCIN <=0.5 SENSITIVE Sensitive     TRIMETH/SULFA <=10 SENSITIVE Sensitive     CLINDAMYCIN <=0.25 SENSITIVE Sensitive     RIFAMPIN <=0.5 SENSITIVE Sensitive     Inducible Clindamycin NEGATIVE Sensitive     * RARE METHICILLIN RESISTANT STAPHYLOCOCCUS AUREUS  Aerobic/Anaerobic Culture (surgical/deep wound)     Status: None (Preliminary result)  Collection Time: 11/13/20  5:56 PM   Specimen: PATH Other; Body Fluid  Result Value Ref Range Status   Specimen Description SYNOVIAL JOINT FLUID RIGHT HIP  Final   Special Requests NONE  Final   Gram Stain   Final    MODERATE WBC PRESENT, PREDOMINANTLY MONONUCLEAR NO ORGANISMS SEEN    Culture   Final    NO GROWTH 4 DAYS NO ANAEROBES ISOLATED; CULTURE IN PROGRESS FOR 5 DAYS Performed at [facility], [address] [place], [place] [address]    Report Status PENDING  Incomplete      Jabier Mutton, [facility] [phone] pager    11/17/2020, 12:16 PM

## 2020-11-18 MED ORDER — MINOCYCLINE HCL 100 MG PO CAPS: 100.0000 mg | ORAL_CAPSULE | Freq: Two times a day (BID) | ORAL | 0 refills | Status: AC

## 2020-11-18 MED ORDER — DAPTOMYCIN IV (FOR PTA / DISCHARGE USE ONLY): 940.0000 mg | INTRAVENOUS | 0 refills | Status: AC

## 2020-11-18 MED ORDER — HEPARIN SOD (PORK) LOCK FLUSH 100 UNIT/ML IV SOLN
250.0000 [IU] | INTRAVENOUS | Status: AC | PRN
  Administered 2020-11-18: 250 [IU]
  Filled 2020-11-18: qty 2.5

## 2020-11-18 MED ORDER — RIFAMPIN 300 MG PO CAPS: 300.0000 mg | ORAL_CAPSULE | Freq: Two times a day (BID) | ORAL | 0 refills | Status: AC

## 2020-11-18 MED ORDER — MUPIROCIN 2 % EX OINT
1.0000 "application " | TOPICAL_OINTMENT | Freq: Two times a day (BID) | CUTANEOUS | Status: DC
  Administered 2020-11-18: 1 via NASAL

## 2020-11-18 MED ORDER — CHLORHEXIDINE GLUCONATE CLOTH 2 % EX PADS
6.0000 | MEDICATED_PAD | Freq: Every day | CUTANEOUS | Status: DC
  Administered 2020-11-18: 6 via TOPICAL

## 2020-11-18 NOTE — Plan of Care (Signed)
Patient adequately ready for discharge home. Patient and spouse given discharge instructions with script for home antibiotics. Dressing change by PA prior to discharged. All patient belongings was carried out by spouse. Patient has an appointment with MD and Infection Control.

## 2020-11-18 NOTE — Discharge Summary (Signed)
Patient ID: Sean Gardner MRN: [id number] DOB/AGE: 1956-10-05 65 y.o.  Admit date: 11/13/2020 Discharge date: 11/18/2020  Admission Diagnoses:  Principal Problem:   Postoperative wound infection of right hip Active Problems:   Staphylococcus aureus infection   Discharge Diagnoses:  Same  Past Medical History:  Diagnosis Date  . Claudication in peripheral vascular disease ([place])   . Hyperlipidemia   . Hypertension   . Lung cancer ([place])    neuroendocrine lung ca dx 2010  . Myocardial infarction ([employer]) 07/2000  . Peripheral vascular disease ([place])   . Shortness of breath    on exertion,can't lay on his back  . Sleep apnea    cpap    Surgeries: Procedure(s): IRRIGATION AND DEBRIDEMENT RIGHT HIP on 11/13/2020   Consultants:   Discharged Condition: Improved  Hospital Course: EAMONN SERMENO is an 65 y.o. male who was admitted 11/13/2020 for operative treatment ofPostoperative wound infection of right hip. Patient has severe unremitting pain that affects sleep, daily activities, and work/hobbies. After pre-op clearance the patient was taken to the operating room on 11/13/2020 and underwent  Procedure(s): IRRIGATION AND DEBRIDEMENT RIGHT HIP.    Patient was given perioperative antibiotics:  Anti-infectives (From admission, onward)   Start     Dose/Rate Route Frequency Ordered Stop   11/18/20 0000  daptomycin (CUBICIN) IVPB        940 mg Intravenous Every 24 hours 11/18/20 0913 12/26/20 2359   11/18/20 0000  minocycline (MINOCIN) 100 MG capsule        100 mg Oral 2 times daily 11/18/20 0913 11/25/20 2359   11/18/20 0000  rifampin (RIFADIN) 300 MG capsule        300 mg Oral 2 times daily 11/18/20 0913 12/26/20 2359   11/15/20 2200  rifampin (RIFADIN) capsule 300 mg        300 mg Oral Every 12 hours 11/15/20 1509     11/15/20 2000  DAPTOmycin (CUBICIN) 940 mg in sodium chloride 0.9 % IVPB        940 mg 237.6 mL/hr over 30 Minutes Intravenous Daily 11/15/20 1509     11/14/20 0900   vancomycin (VANCOCIN) IVPB 1000 mg/200 mL premix  Status:  Discontinued        1,000 mg 200 mL/hr over 60 Minutes Intravenous Every 12 hours 11/14/20 0800 11/15/20 1509   11/13/20 2100  vancomycin (VANCOREADY) IVPB 1250 mg/250 mL  Status:  Discontinued        1,250 mg 166.7 mL/hr over 90 Minutes Intravenous Every 12 hours 11/13/20 2004 11/14/20 0800   11/13/20 1825  vancomycin (VANCOCIN) powder  Status:  Discontinued          As needed 11/13/20 1825 11/13/20 1826   11/13/20 1634  ceFAZolin (ANCEF) 2-4 GM/100ML-% IVPB       Note to Pharmacy: Ladoris Gene   : cabinet override      11/13/20 1634 11/14/20 0444   11/13/20 1634  ceFAZolin (ANCEF) 1-4 GM/50ML-% IVPB       Note to Pharmacy: Ladoris Gene   : cabinet override      11/13/20 1634 11/14/20 0444   11/13/20 1300  ceFAZolin (ANCEF) 3 g in dextrose 5 % 50 mL IVPB  Status:  Discontinued        3 g 100 mL/hr over 30 Minutes Intravenous To Surgery 11/13/20 1256 11/13/20 1336       Patient was given sequential compression devices, early ambulation, and chemoprophylaxis to prevent DVT.  Patient benefited maximally from hospital  stay and there were no complications.    Recent vital signs:  Patient Vitals for the past 24 hrs:  BP Temp Temp src Pulse Resp SpO2  11/18/20 0757 139/66 97.7 F (36.5 C) Oral (!) 59 17 100 %  11/18/20 0429 (!) 163/81 97.8 F (36.6 C) Oral 63 20 98 %  11/17/20 2341 134/67 (!) 97.5 F (36.4 C) Oral (!) 58 20 100 %  11/17/20 2031 (!) 152/63 97.7 F (36.5 C) Oral 60 20 100 %  11/17/20 1618 115/87 98.2 F (36.8 C) Oral 61 18 99 %  11/17/20 1136 123/77 (!) 97.4 F (36.3 C) Oral (!) 53 18 99 %     Recent laboratory studies: No results for input(s): WBC, HGB, HCT, PLT, NA, K, CL, CO2, BUN, CREATININE, GLUCOSE, INR, CALCIUM in the last 72 hours.  Invalid input(s): PT, 2   Discharge Medications:   Allergies as of 11/18/2020      Reactions   Lisinopril Other (See Comments)   Hyperkalemia   Zolpidem  Tartrate Other (See Comments)   Felt fuzzy and hallucinations      Medication List    STOP taking these medications   methocarbamol 500 MG tablet Commonly known as: Robaxin   methocarbamol 750 MG tablet Commonly known as: ROBAXIN   oxyCODONE-acetaminophen 5-325 MG tablet Commonly known as: Percocet     TAKE these medications   amLODipine 5 MG tablet Commonly known as: NORVASC Take 1 tablet (5 mg total) by mouth daily. What changed: when to take this   aspirin EC 81 MG tablet Take 81 mg by mouth daily. Swallow whole. What changed: Another medication with the same name was removed. Continue taking this medication, and follow the directions you see here.   clopidogrel 75 MG tablet Commonly known as: PLAVIX Take 1 tablet (75 mg total) by mouth daily before breakfast.   daptomycin  IVPB Commonly known as: CUBICIN Inject 940 mg into the vein daily. Indication:  MRSA surgical site infection First Dose: Yes Last Day of Therapy:  12/25/20 Labs - Once weekly:  CBC/D, BMP, and CPK Labs - Every other week:  ESR and CRP Method of administration: IV Push Method of administration may be changed at the discretion of home infusion pharmacist based upon assessment of the patient and/or caregiver's ability to self-administer the medication ordered.   diphenhydramine-acetaminophen 25-500 MG Tabs tablet Commonly known as: TYLENOL PM Take 2 tablets by mouth at bedtime as needed.   docusate sodium 100 MG capsule Commonly known as: Colace Take 1 capsule (100 mg total) by mouth daily as needed.   ezetimibe 10 MG tablet Commonly known as: ZETIA Take 10 mg by mouth daily.   hydrALAZINE 25 MG tablet Commonly known as: APRESOLINE TAKE 1 TABLET BY MOUTH THREE TIMES A DAY   HYDROcodone-acetaminophen 5-325 MG tablet Commonly known as: Norco Take 1-2 tablets by mouth 3 (three) times daily as needed.   metoprolol tartrate 50 MG tablet Commonly known as: LOPRESSOR Take 50 mg by mouth 2  (two) times daily. Hold if systolic blood pressure (top blood pressure number) less than 100 mmHg or heart rate less than 60 bpm (pulse).   minocycline 100 MG capsule Commonly known as: MINOCIN Take 1 capsule (100 mg total) by mouth 2 (two) times daily for 7 days. Please take until daptomycin is approved   omeprazole 20 MG capsule Commonly known as: PRILOSEC Take 20 mg by mouth daily.   ondansetron 4 MG tablet Commonly known as: Zofran Take 1  tablet (4 mg total) by mouth every 8 (eight) hours as needed for nausea or vomiting.   Repatha SureClick 625 MG/ML [place] Generic drug: Evolocumab Inject 140 mg into the skin every 14 (fourteen) days.   rifampin 300 MG capsule Commonly known as: Rifadin Take 1 capsule (300 mg total) by mouth 2 (two) times daily.   rosuvastatin 40 MG tablet Commonly known as: CRESTOR Take 40 mg by mouth daily.   Symbicort 160-4.5 MCG/ACT inhaler Generic drug: budesonide-formoterol Inhale 2 puffs into the lungs daily as needed (Shortness of breath).   traZODone 50 MG tablet Commonly known as: DESYREL Take 1 tablet (50 mg total) by mouth at bedtime.   triamcinolone ointment 0.1 % Commonly known as: KENALOG Apply 1 application topically 2 (two) times daily.   Vitamin D (Ergocalciferol) 1.25 MG (50000 UNIT) Caps capsule Commonly known as: DRISDOL Take 50,000 Units by mouth every Saturday.            Durable Medical Equipment  (From admission, onward)         Start     Ordered   11/13/20 1945  DME Walker rolling  Once       Question:  Patient needs a walker to treat with the following condition  Answer:  History of hip replacement   11/13/20 1944   11/13/20 1945  DME 3 n 1  Once        11/13/20 1944   11/13/20 1945  DME Bedside commode  Once       Question:  Patient needs a bedside commode to treat with the following condition  Answer:  History of hip replacement   11/13/20 1944           Discharge Care Instructions  (From admission,  onward)         Start     Ordered   11/18/20 0000  Change dressing on IV access line weekly and PRN  (Home infusion instructions - [facility] )        11/18/20 0913          Diagnostic Studies: DG C-Arm 1-60 Min  Result Date: 11/13/2020 CLINICAL DATA:  Surgery, elective. Additional history provided: Irrigation and debridement right hip with possible head and poly exchange. Provided fluoroscopy time 5 seconds (0.57 mGy). EXAM: OPERATIVE right HIP (WITH PELVIS IF PERFORMED) 1 VIEWS TECHNIQUE: Fluoroscopic spot image(s) were submitted for interpretation post-operatively. COMPARISON:  Radiographs of the right hip 11/13/2020 FINDINGS: A single PA view intraoperative fluoroscopic image of the right hip is submitted. Right total hip arthroplasty. The femoral and acetabular components appear well seated. Correlate with the procedural history. IMPRESSION: Single intraoperative fluoroscopic image of the right hip, as described. Electronically Signed   By: Kellie Simmering DO   On: 11/13/2020 18:16   DG HIP OPERATIVE UNILAT W OR W/O PELVIS RIGHT  Result Date: 11/13/2020 CLINICAL DATA:  Surgery, elective. Additional history provided: Irrigation and debridement right hip with possible head and poly exchange. Provided fluoroscopy time 5 seconds (0.57 mGy). EXAM: OPERATIVE right HIP (WITH PELVIS IF PERFORMED) 1 VIEWS TECHNIQUE: Fluoroscopic spot image(s) were submitted for interpretation post-operatively. COMPARISON:  Radiographs of the right hip 11/13/2020 FINDINGS: A single PA view intraoperative fluoroscopic image of the right hip is submitted. Right total hip arthroplasty. The femoral and acetabular components appear well seated. Correlate with the procedural history. IMPRESSION: Single intraoperative fluoroscopic image of the right hip, as described. Electronically Signed   By: Kellie Simmering DO   On:  11/13/2020 18:16   XR HIP UNILAT W OR W/O PELVIS 2-3 VIEWS RIGHT  Result Date: 11/13/2020 Stable  right total hip replacement without complication.  XR Pelvis 1-2 Views  Result Date: 11/01/2020 Well-seated prosthesis without complication  [name] EKG SITE RITE  Result Date: 11/14/2020 If Site Rite image not attached, placement could not be confirmed due to current cardiac rhythm.   Disposition: Discharge disposition: 01-Home or Self Care       Discharge Instructions    [facility] pharmacist to adjust dose for Vancomycin, Aminoglycosides and other anti-infective therapies as requested by physician.   Complete by: As directed    [facility] to provide Cath Flo 12m   Complete by: As directed    Administer for PICC line occlusion and as ordered by physician for other access device issues.   Anaphylaxis Kit: Provided to treat any anaphylactic reaction to the medication being provided to the patient if First Dose or when requested by physician   Complete by: As directed    Epinephrine 151mml vial / amp: Administer 0.37m70m0.37ml74mubcutaneously once for moderate to severe anaphylaxis, nurse to call physician and pharmacy when reaction occurs and call 911 if needed for immediate care   Diphenhydramine 50mg31mIV vial: Administer 25-50mg 18mM PRN for first dose reaction, rash, itching, mild reaction, nurse to call physician and pharmacy when reaction occurs   Sodium Chloride 0.9% NS 500ml I39mdminister if needed for hypovolemic blood pressure drop or as ordered by physician after call to physician with anaphylactic reaction   Change dressing on IV access line weekly and PRN   Complete by: As directed    Flush IV access with Sodium Chloride 0.9% and Heparin 10 units/ml or 100 units/ml   Complete by: As directed    Home infusion instructions - [facility]   Complete by: As directed    Instructions: Flush IV access with Sodium Chloride 0.9% and Heparin 10units/ml or 100units/ml   Change dressing on IV access line: Weekly and PRN   Instructions Cath Flo 2mg:  A79mnister for PICC Line occlusion and as ordered by physician for other access device   [facility] pharmacist to adjust dose for: Vancomycin, Aminoglycosides and other anti-infective therapies as requested by physician   Method of administration may be changed at the discretion of home infusion pharmacist based upon assessment of the patient and/or caregiver's ability to self-administer the medication ordered   Complete by: As directed        Follow-up Information    Xu, NaipLeandrew Koyanagi2 weeks.   Specialty: Orthopedic Surgery Why: For suture removal, For wound re-check Contact information: [phone]        Signed: Hector Taft L SAundra Dubin22, 9:14 AM

## 2020-11-18 NOTE — Progress Notes (Addendum)
Subjective: 5 Days Post-Op Procedure(s) (LRB): IRRIGATION AND DEBRIDEMENT RIGHT HIP (Right) Patient reports pain as mild.    Objective: Vital signs in last 24 hours: Temp:  [97.4 F (36.3 C)-98.2 F (36.8 C)] 97.7 F (36.5 C) (01/31 0757) Pulse Rate:  [53-63] 59 (01/31 0757) Resp:  [17-20] 17 (01/31 0757) BP: (115-163)/(63-87) 139/66 (01/31 0757) SpO2:  [98 %-100 %] 100 % (01/31 0757)  Intake/Output from previous day: No intake/output data recorded. Intake/Output this shift: No intake/output data recorded.  No results for input(s): HGB in the last 72 hours. No results for input(s): WBC, RBC, HCT, PLT in the last 72 hours. No results for input(s): NA, K, CL, CO2, BUN, CREATININE, GLUCOSE, CALCIUM in the last 72 hours. No results for input(s): LABPT, INR in the last 72 hours.  Neurologically intact Neurovascular intact Sensation intact distally Intact pulses distally Dorsiflexion/Plantar flexion intact Incision: no drainage No cellulitis present Compartment soft  Wound vac in place and functioning.  Still no fluid in canister   Assessment/Plan: 5 Days Post-Op Procedure(s) (LRB): IRRIGATION AND DEBRIDEMENT RIGHT HIP (Right) PLAN WBAT RLE Wound cx grew MRSA.  Joint fluid cx still ngtd.   Ok to d/c this am on oral abx (rifampin and minocycline) until IV abx approved by [place].  Will then be on oral rifampin and iv daptomycin.  Pam chandler iv infusion coordinator currently awaiting approval from [facility]. Wound vac removed by me and aquacel applied      Sean Gardner 11/18/2020, 9:32 AM

## 2020-11-19 ENCOUNTER — Telehealth: Payer: Self-pay | Admitting: *Deleted

## 2020-11-19 LAB — AEROBIC/ANAEROBIC CULTURE W GRAM STAIN (SURGICAL/DEEP WOUND)
Culture: NO GROWTH
Gram Stain: NONE SEEN

## 2020-11-19 NOTE — Telephone Encounter (Signed)
Call to patient and spoke to him also spoke with Carolynn Sayers, Home Infusion coordinator. [facility] did [facility] and he is starting IV abx. Patient confirmed [facility] nurse was in home as I called and starting first dose of IV Abx. Did not even have to orally bridge. All HH orders will come to you. Thanks.

## 2020-11-19 NOTE — Telephone Encounter (Signed)
Ok, thanks.

## 2020-11-20 ENCOUNTER — Encounter: Payer: Self-pay | Admitting: Physician Assistant

## 2020-11-20 ENCOUNTER — Ambulatory Visit (INDEPENDENT_AMBULATORY_CARE_PROVIDER_SITE_OTHER): Payer: Medicare Other | Admitting: Physician Assistant

## 2020-11-20 ENCOUNTER — Telehealth: Payer: Self-pay | Admitting: *Deleted

## 2020-11-20 ENCOUNTER — Telehealth: Payer: Self-pay | Admitting: Orthopaedic Surgery

## 2020-11-20 VITALS — BP 134/88 | HR 102 | Temp 98.2°F

## 2020-11-20 DIAGNOSIS — S71001D Unspecified open wound, right hip, subsequent encounter: Secondary | ICD-10-CM

## 2020-11-20 NOTE — Telephone Encounter (Signed)
RNCM call to [place] and provided verbal orders regarding additional wound care for patient to be provided during skilled nursing visits for IV abx. Requested to observe wound incision, cleanse with NS, and recover. Patient to wear compression garments for help with compression of wound. Small to moderate oozing between sutures today and no changes to treatment plan by PA after observing wound in office today. Continue with IV abx.

## 2020-11-20 NOTE — Telephone Encounter (Signed)
Looks like pt was seen today

## 2020-11-20 NOTE — Progress Notes (Signed)
Post-Op Visit Note   Patient: Sean Gardner           Date of Birth: [birth year]           MRN: [id number] Visit Date: 11/20/2020 PCP: [name], MD   Assessment & Plan:  Chief Complaint: No chief complaint on file.  Visit Diagnoses:  1. Bleeding from right hip wound, subsequent encounter     Plan: Patient is a pleasant 65 year old gentleman who comes in today 1 week out I&D right hip following a right total hip replacement.  Intraoperative cultures showed only a superficial infection which did grow out MRSA.  He is currently on rifampin and IV daptomycin through PICC line.  He denies any fevers or chills.  He does note that today while in his yard he noticed serosanguineous drainage coming from the right hip.  He is here today for further evaluation.  He denies any increased pain.  No fevers or chills.  Of note, he is on Plavix and aspirin.  Vital signs: Oral temperature 98.2 F, blood pressure 134/80 and pulse 102.  Examination of his right hip reveals slight bloody drainage to the mid to distal incision site.  No signs of cellulitis or infection.  There is no purulent drainage.  No tenderness.  At this point, we will we will apply Steri-Strips and a compressive bandage to the wound.  He will continue with his IV and oral antibiotics.  He will continue to take as easy as much as possible.  We have arranged home health to come out to assess his wound and to change his bandages twice a week.  Him and his wife are in agreement to do this during the other days.  He will follow up with [name] next week for repeat evaluation and possible suture removal.  They will call with any concerns or questions in the meantime.  Follow-Up Instructions: Return in about 1 week (around 11/27/2020).   Orders:  No orders of the defined types were placed in this encounter.  No orders of the defined types were placed in this encounter.   Imaging: No new imaging  PMFS History: Patient Active Problem List    Diagnosis Date Noted  . Staphylococcus aureus infection   . Status post total hip replacement, right 11/13/2020  . Postoperative wound infection of right hip 11/13/2020  . Status post total replacement of right hip 09/16/2020  . Primary osteoarthritis of right hip 09/15/2020  . Hyperkalemia 01/10/2020  . Bone metastasis ([place]) 09/05/2018  . Sepsis secondary to UTI ([place]) 05/11/2016  . CAD (coronary artery disease) 05/11/2016  . Hyperlipidemia 05/11/2016  . Morbid (severe) obesity due to excess calories ([place]) 03/21/2016  . Sleep apnea 09/18/2015  . Dyspnea and respiratory abnormality 09/18/2015  . Dyspnea 10/11/2012  . Essential hypertension 10/11/2012  . Lung cancer ([place]) 03/23/2012   Past Medical History:  Diagnosis Date  . Claudication in peripheral vascular disease ([place])   . Hyperlipidemia   . Hypertension   . Lung cancer ([place])    neuroendocrine lung ca dx 2010  . Myocardial infarction ([place]) 07/2000  . Peripheral vascular disease ([place])   . Shortness of breath    on exertion,can't lay on his back  . Sleep apnea    cpap    Family History  Problem Relation Age of Onset  . Hypertension Mother   . Lung cancer Father     Past Surgical History:  Procedure Laterality Date  . ACHILLES TENDON SURGERY    .  ANTERIOR HIP REVISION Right 11/13/2020   Procedure: IRRIGATION AND DEBRIDEMENT RIGHT HIP;  Surgeon: Leandrew Koyanagi, MD;  Location: [facility];  Service: Orthopedics;  Laterality: Right;  . CORONARY ARTERY BYPASS GRAFT  08/18/2000   LIMA--dLIMA, RIMA-dRCA, left RA-OM2, SVG-D2  . FLEXIBLE SIGMOIDOSCOPY N/A 01/18/2013   Procedure: FLEXIBLE SIGMOIDOSCOPY;  Surgeon: Arta Silence, MD;  [facility];  Service: Endoscopy;  Laterality: N/A;  . HERNIA REPAIR     inguinal  . HOT HEMOSTASIS N/A 01/18/2013   Procedure: HOT HEMOSTASIS (ARGON PLASMA COAGULATION/BICAP);  Surgeon: Arta Silence, MD;  Location: [facility];  Service: Endoscopy;  Laterality: N/A;  . LUNG SURGERY Left  01/29/2009   left lower lobectomy   . TOTAL HIP ARTHROPLASTY Right 09/16/2020   Procedure: RIGHT TOTAL HIP ARTHROPLASTY ANTERIOR APPROACH;  Surgeon: Leandrew Koyanagi, MD;  Location: [facility];  Service: Orthopedics;  Laterality: Right;   Social History   Occupational History  . Not on file  Tobacco Use  . Smoking status: Former Smoker    Packs/day: 2.00    Years: 20.00    Pack years: 40.00    Types: Cigarettes    Quit date: 10/19/2008    Years since quitting: 12.0  . Smokeless tobacco: Never Used  Vaping Use  . Vaping Use: Never used  Substance and Sexual Activity  . Alcohol use: Yes    Comment: occ  . Drug use: No  . Sexual activity: Not on file

## 2020-11-20 NOTE — Telephone Encounter (Signed)
Patient called advised his incision site has opened and he is by himself and is not feeling well. Patient said there is blood and drainage coming out from under the bandage.  Patient said his wife is at her appointment and he will try to be here at 1:00pm today. Patient was advised he will be seeing Mendel Ryder Dr.Xu PA.

## 2020-11-26 ENCOUNTER — Other Ambulatory Visit: Payer: Self-pay

## 2020-11-26 ENCOUNTER — Ambulatory Visit (INDEPENDENT_AMBULATORY_CARE_PROVIDER_SITE_OTHER): Payer: Medicare Other | Admitting: Orthopaedic Surgery

## 2020-11-26 ENCOUNTER — Encounter: Payer: Self-pay | Admitting: Orthopaedic Surgery

## 2020-11-26 DIAGNOSIS — T8149XA Infection following a procedure, other surgical site, initial encounter: Secondary | ICD-10-CM

## 2020-11-26 NOTE — Progress Notes (Signed)
Post-Op Visit Note   Patient: Sean Gardner           Date of Birth: [birth year]           MRN: [id number] Visit Date: 11/26/2020 PCP: [name], MD   Assessment & Plan:  Chief Complaint:  Chief Complaint  Patient presents with   Right Hip - Pain   Visit Diagnoses:  1. Postoperative wound infection of right hip     Plan:   Mr. Hari returns today for his 2-week postoperative visit for irrigation debridement of a superficial wound infection right hip.  He reports no pain.  He has been getting the prescribed IV antibiotics.  He does have some mild bloody drainage.  He takes aspirin and Plavix at baseline due to heart condition.  Right hip shows 3 pin sized hole areas in the incision are not completely healed.  One of the pinholes has serosanguineous drainage which I was able to express approximately 10 cc out of.  There was no evidence of infection.  No evidence of wound dehiscence.  No evidence of cellulitis.  Overall the thigh is edematous.  He has no pain with movement of the leg.  Sutures were removed today and Steri-Strips placed.  The pin-sized holes were left open to allow for any drainage.  The fact that he is on aspirin and Plavix is likely the cause of this but I cannot take him off of these medications due to his heart condition.  He is on the appropriate IV antibiotics which will cover him while the wound heals.  I would like to recheck him in a week.  He and his wife both know to reach out to me at any time should he develop any problems.  Any problems.  Follow-Up Instructions: Return in about 1 week (around 12/03/2020).   Orders:  No orders of the defined types were placed in this encounter.  No orders of the defined types were placed in this encounter.   Imaging: No results found.  PMFS History: Patient Active Problem List   Diagnosis Date Noted   Staphylococcus aureus infection    Status post total hip replacement, right 11/13/2020   Postoperative  wound infection of right hip 11/13/2020   Status post total replacement of right hip 09/16/2020   Primary osteoarthritis of right hip 09/15/2020   Hyperkalemia 01/10/2020   Bone metastasis ([place]) 09/05/2018   Sepsis secondary to UTI ([place]) 05/11/2016   CAD (coronary artery disease) 05/11/2016   Hyperlipidemia 05/11/2016   Morbid (severe) obesity due to excess calories ([place]) 03/21/2016   Sleep apnea 09/18/2015   Dyspnea and respiratory abnormality 09/18/2015   Dyspnea 10/11/2012   Essential hypertension 10/11/2012   Lung cancer ([place]) 03/23/2012   Past Medical History:  Diagnosis Date   Claudication in peripheral vascular disease ([place])    Hyperlipidemia    Hypertension    Lung cancer ([place])    neuroendocrine lung ca dx 2010   Myocardial infarction ([place]) 07/2000   Peripheral vascular disease ([facility])    Shortness of breath    on exertion,can't lay on his back   Sleep apnea    cpap    Family History  Problem Relation Age of Onset   Hypertension Mother    Lung cancer Father     Past Surgical History:  Procedure Laterality Date   ACHILLES TENDON SURGERY     ANTERIOR HIP REVISION Right 11/13/2020   Procedure: IRRIGATION AND DEBRIDEMENT RIGHT HIP;  Surgeon: Frankey Shown  M, MD;  Location: [facility];  Service: Orthopedics;  Laterality: Right;   CORONARY ARTERY BYPASS GRAFT  08/18/2000   LIMA--dLIMA, RIMA-dRCA, left RA-OM2, SVG-D2   FLEXIBLE SIGMOIDOSCOPY N/A 01/18/2013   Procedure: FLEXIBLE SIGMOIDOSCOPY;  Surgeon: Arta Silence, MD;  [facility];  Service: Endoscopy;  Laterality: N/A;   HERNIA REPAIR     inguinal   HOT HEMOSTASIS N/A 01/18/2013   Procedure: HOT HEMOSTASIS (ARGON PLASMA COAGULATION/BICAP);  Surgeon: Arta Silence, MD;  Location: [facility];  Service: Endoscopy;  Laterality: N/A;   LUNG SURGERY Left 01/29/2009   left lower lobectomy    TOTAL HIP ARTHROPLASTY Right 09/16/2020   Procedure: RIGHT TOTAL HIP ARTHROPLASTY ANTERIOR  APPROACH;  Surgeon: Leandrew Koyanagi, MD;  Location: [facility];  Service: Orthopedics;  Laterality: Right;   Social History   Occupational History   Not on file  Tobacco Use   Smoking status: Former Smoker    Packs/day: 2.00    Years: 20.00    Pack years: 40.00    Types: Cigarettes    Quit date: 10/19/2008    Years since quitting: 12.1   Smokeless tobacco: Never Used  Scientific laboratory technician Use: Never used  Substance and Sexual Activity   Alcohol use: Yes    Comment: occ   Drug use: No   Sexual activity: Not on file

## 2020-12-03 ENCOUNTER — Ambulatory Visit (INDEPENDENT_AMBULATORY_CARE_PROVIDER_SITE_OTHER): Payer: Medicare Other | Admitting: Orthopaedic Surgery

## 2020-12-03 ENCOUNTER — Encounter: Payer: Self-pay | Admitting: Orthopaedic Surgery

## 2020-12-03 ENCOUNTER — Other Ambulatory Visit (HOSPITAL_COMMUNITY)
Admission: RE | Admit: 2020-12-03 | Discharge: 2020-12-03 | Disposition: A | Discharge status: Home / Self Care | Payer: Medicare Other | Source: Ambulatory Visit | Attending: Orthopaedic Surgery | Admitting: Orthopaedic Surgery

## 2020-12-03 DIAGNOSIS — Z01812 Encounter for preprocedural laboratory examination: Secondary | ICD-10-CM | POA: Diagnosis present

## 2020-12-03 DIAGNOSIS — U071 COVID-19: Secondary | ICD-10-CM | POA: Diagnosis not present

## 2020-12-03 DIAGNOSIS — T8149XA Infection following a procedure, other surgical site, initial encounter: Secondary | ICD-10-CM

## 2020-12-03 DIAGNOSIS — A4901 Methicillin susceptible Staphylococcus aureus infection, unspecified site: Secondary | ICD-10-CM

## 2020-12-03 LAB — SARS CORONAVIRUS 2 (TAT 6-24 HRS): SARS Coronavirus 2: POSITIVE — AB

## 2020-12-03 NOTE — Progress Notes (Signed)
Post-Op Visit Note   Patient: Sean Gardner           Date of Birth: 1956-05-14           MRN: [id number] Visit Date: 12/03/2020 PCP: [name], MD   Assessment & Plan:  Chief Complaint:  Chief Complaint  Patient presents with  . Right Hip - Pain   Visit Diagnoses:  1. Staphylococcus aureus infection   2. Postoperative wound infection of right hip     Plan:   Dionis returns today for recheck of his right hip wound.  Denies any pain.  Continues to have drainage.  Surgical wound shows persistent drainage with pin-sized holes with serosanguineous drainage.  There is a new opening in the distal extent of scarring with fibrinous exudative tissue.  There is drainage from this area as well.  Given these findings I recommend returning back to operating room for repeat I&D and exploration with possible head and liner exchange as indicated.  He will remain on antibiotics.  He has not been taking his aspirin and Plavix.  We will plan to do this in the next couple days.  Follow-Up Instructions: Return for 1 week postop visit.   Orders:  No orders of the defined types were placed in this encounter.  No orders of the defined types were placed in this encounter.   Imaging: No results found.  PMFS History: Patient Active Problem List   Diagnosis Date Noted  . Staphylococcus aureus infection   . Status post total hip replacement, right 11/13/2020  . Postoperative wound infection of right hip 11/13/2020  . Status post total replacement of right hip 09/16/2020  . Primary osteoarthritis of right hip 09/15/2020  . Hyperkalemia 01/10/2020  . Bone metastasis ([place]) 09/05/2018  . Sepsis secondary to UTI ([place]) 05/11/2016  . CAD (coronary artery disease) 05/11/2016  . Hyperlipidemia 05/11/2016  . Morbid (severe) obesity due to excess calories ([place]) 03/21/2016  . Sleep apnea 09/18/2015  . Dyspnea and respiratory abnormality 09/18/2015  . Dyspnea 10/11/2012  . Essential hypertension  10/11/2012  . Lung cancer ([place]) 03/23/2012   Past Medical History:  Diagnosis Date  . Claudication in peripheral vascular disease ([place])   . Hyperlipidemia   . Hypertension   . Lung cancer ([place])    neuroendocrine lung ca dx 2010  . Myocardial infarction ([place]) 07/2000  . Peripheral vascular disease ([place])   . Shortness of breath    on exertion,can't lay on his back  . Sleep apnea    cpap    Family History  Problem Relation Age of Onset  . Hypertension Mother   . Lung cancer Father     Past Surgical History:  Procedure Laterality Date  . ACHILLES TENDON SURGERY    . ANTERIOR HIP REVISION Right 11/13/2020   Procedure: IRRIGATION AND DEBRIDEMENT RIGHT HIP;  Surgeon: Leandrew Koyanagi, MD;  Location: [facility];  Service: Orthopedics;  Laterality: Right;  . CORONARY ARTERY BYPASS GRAFT  08/18/2000   LIMA--dLIMA, RIMA-dRCA, left RA-OM2, SVG-D2  . FLEXIBLE SIGMOIDOSCOPY N/A 01/18/2013   Procedure: FLEXIBLE SIGMOIDOSCOPY;  Surgeon: Arta Silence, MD;  [facility];  Service: Endoscopy;  Laterality: N/A;  . HERNIA REPAIR     inguinal  . HOT HEMOSTASIS N/A 01/18/2013   Procedure: HOT HEMOSTASIS (ARGON PLASMA COAGULATION/BICAP);  Surgeon: Arta Silence, MD;  Location: [facility];  Service: Endoscopy;  Laterality: N/A;  . LUNG SURGERY Left 01/29/2009   left lower lobectomy   . TOTAL HIP ARTHROPLASTY  Right 09/16/2020   Procedure: RIGHT TOTAL HIP ARTHROPLASTY ANTERIOR APPROACH;  Surgeon: Leandrew Koyanagi, MD;  Location: [facility];  Service: Orthopedics;  Laterality: Right;   Social History   Occupational History  . Not on file  Tobacco Use  . Smoking status: Former Smoker    Packs/day: 2.00    Years: 20.00    Pack years: 40.00    Types: Cigarettes    Quit date: 10/19/2008    Years since quitting: 12.1  . Smokeless tobacco: Never Used  Vaping Use  . Vaping Use: Never used  Substance and Sexual Activity  . Alcohol use: Yes    Comment: occ  . Drug use: No  . Sexual activity: Not on  file

## 2020-12-04 ENCOUNTER — Telehealth: Payer: Self-pay | Admitting: Family

## 2020-12-04 ENCOUNTER — Encounter (HOSPITAL_COMMUNITY): Payer: Self-pay | Admitting: Orthopaedic Surgery

## 2020-12-04 MED ORDER — TRANEXAMIC ACID 1000 MG/10ML IV SOLN
2000.0000 mg | INTRAVENOUS | Status: AC
  Administered 2020-12-05: 2000 mg via TOPICAL
  Filled 2020-12-04: qty 20

## 2020-12-04 MED ORDER — BUPIVACAINE LIPOSOME 1.3 % IJ SUSP
20.0000 mL | Freq: Once | INTRAMUSCULAR | Status: DC
  Filled 2020-12-04: qty 20

## 2020-12-04 NOTE — Anesthesia Preprocedure Evaluation (Addendum)
Anesthesia Evaluation  Patient identified by MRN, date of birth, ID band Patient awake    Reviewed: Allergy & Precautions, NPO status , Patient's Chart, lab work & pertinent test results, reviewed documented beta blocker date and time   Airway Mallampati: II  TM Distance: >3 FB Neck ROM: Full    Dental  (+) Teeth Intact, Dental Advisory Given   Pulmonary sleep apnea and Continuous Positive Airway Pressure Ventilation , former smoker,  neuroendocrine lung ca  COVID+   Pulmonary exam normal breath sounds clear to auscultation       Cardiovascular hypertension, Pt. on home beta blockers and Pt. on medications + CAD, + Past MI, + CABG and + Peripheral Vascular Disease  Normal cardiovascular exam Rhythm:Regular Rate:Normal     Neuro/Psych negative neurological ROS  negative psych ROS   GI/Hepatic Neg liver ROS, GERD  Medicated,  Endo/Other  Obesity   Renal/GU negative Renal ROS     Musculoskeletal negative musculoskeletal ROS (+)   Abdominal   Peds  Hematology  (+) Blood dyscrasia (Plavix), , Plt 300k   Anesthesia Other Findings Day of surgery medications reviewed with the patient.  Reproductive/Obstetrics                           Anesthesia Physical Anesthesia Plan  ASA: III  Anesthesia Plan: General   Post-op Pain Management:    Induction: Intravenous  PONV Risk Score and Plan: 2 and Dexamethasone and Ondansetron  Airway Management Planned: Oral ETT  Additional Equipment:   Intra-op Plan:   Post-operative Plan: Extubation in OR  Informed Consent: I have reviewed the patients History and Physical, chart, labs and discussed the procedure including the risks, benefits and alternatives for the proposed anesthesia with the patient or authorized representative who has indicated his/her understanding and acceptance.     Dental advisory given  Plan Discussed with:  CRNA  Anesthesia Plan Comments: (PAT note written 12/04/2020 by Myra Gianotti, PA-C.  + COVID-19 12/03/20, asymptomatic as of 12/04/20. )       Anesthesia Quick Evaluation

## 2020-12-04 NOTE — Telephone Encounter (Signed)
Called to discuss with patient about COVID-19 symptoms and the use of one of the available treatments for those with mild to moderate Covid symptoms and at a high risk of hospitalization.  Pt appears to qualify for outpatient treatment due to co-morbid conditions and/or a member of an at-risk group in accordance with the FDA Emergency Use Authorization.    Symptom onset: Unknown Vaccinated: Yes Booster? Unknown Immunocompromised? Yes - present infection Qualifiers: Age, Cardiovascular  Unable to reach pt - Left VM requesting call back.  Sent MyChart message.   Loel Dubonnet

## 2020-12-04 NOTE — Progress Notes (Signed)
Can you let him know about the covid test please. He still needs the surgery.

## 2020-12-04 NOTE — Progress Notes (Signed)
  Cardiologist: Adrian Prows, MD  EKG:  08/22/20 CXR:  09/05/20 ECHO:  Denies Stress Test:  Denies Cardiac Cath:  Denies  Fasting Blood Sugar-  N/A Checks Blood Sugar_N/A__ times a day  OSA/CPAP:  Yes  ASA:  Last dose 12/03/20 Blood Thinner:  Last dose Plavix 12/03/20  Covid test 11/02/20 positive.  Dr. Erlinda Hong aware.  Patient given instructions to wait at entrance and call when arrive  Anesthesia Review:  Yes, cardiac history  Patient denies shortness of breath, fever, cough, and chest pain at PAT appointment.  Patient verbalized understanding of instructions provided today at the PAT appointment.  Patient asked to review instructions at home and day of surgery.

## 2020-12-04 NOTE — Progress Notes (Signed)
Left a voice message for Sean Gardner from Dr. Lady Deutscher office with + covid results for this pt. The pt is to have surgery on Thurs 12/05/20 at [facility] at 1558. The pt has not been notified as there will be pertinent questions the provider needs to answer.   Positive Results for:  Asymptomatic: Procedure postponed and quarantined for 10 days, unless the procedure is urgent. Please follow the facility's protocol regarding pt and family arrival.  Symptomatic: Procedure postponed and quarantined for 14 days.  Hospitalized with Covid: postponed and quarantined  for 21 days.  Immunocompromised: Procedure postponed and quarantine for 20 days.  The pt will not be retested for 90 days from the + result.

## 2020-12-04 NOTE — Progress Notes (Signed)
Anesthesia Chart Review: Sean Gardner   Case: [id number] Date/Time: 12/05/20 1558   Procedure: IRRIGATION AND DEBRIDEMENT RIGHT HIP WITH POSSIBLE POLY EXCHANGE (Right Hip)   Anesthesia type: Choice   Pre-op diagnosis: right hip wound   Location: [facility] OR ROOM 06 / [facility]   Surgeons: Leandrew Koyanagi, MD      DISCUSSION: Patient is a 65 year old male scheduled for the above procedure. He is s/p right THA 09/16/20. He fell in mid-late January and then developed induration and drainage. S/p excisional debridement of right hip surgical site infection, secondary closure of wound dehiscence, wound VAC 11/13/20 (wound culture: MRSA, ID consulted for antibiotic recommendations). He was seen in follow-uop 12/03/20 and noted to have persistent serosanguinous drainage with new opening. Repeat I&D in the OR recommended.  History includes former smoker (quit 10/19/08), CAD (MI 2001, s/p CABG 08/18/00: LIMA-dLAD, RIMA-dRCA, left RA-OM2, SVG-D2; NSTEMI 08/13/10 with 99% SVG-D2, s/p unsuccessful thrombectomy, medical management), HTN, PVD/claudication (s/p left SFA stent 01/24/04), HLD, exertional dyspnea, neuroendocrine lung carcinoma (s/p LL lobectomy with LN dissection 01/29/09, 1+ LN, s/p chemotherapy), OSA (CPAP), THA (right 09/16/20), obesity.    Last visit with cardiologist Dr. Einar Gip was on 08/22/20 prior to right THA. Last ASA and Plavix 12/03/20.   Preoperative COVID-19 test came back positive on 12/03/20. Given the nature of patient's wound, proceeding with surgery recommended by Dr. Erlinda Hong. As of 12/04/20 AM, patient was asymptomatic (of COVID).   PAT staff aware of + COVID-19 result and have given him instructions on calling when arrived to wait for staff before entering hospital for surgery.    VS:  BP Readings from Last 3 Encounters:  11/20/20 134/88  11/18/20 139/66  11/13/20 132/74   Pulse Readings from Last 3 Encounters:  11/20/20 (!) 102  11/18/20 (!) 59  11/13/20 64    PROVIDERS: [name], MD  is PCP   Adrian Prows, MD is cardiologist. Last visit 08/22/20.  Curt Bears, MD is HEM-ONC. Last visit in 2019.  Christinia Gully, MD is pulmonologist. Last visit 03/21/16.   LABS: Most recent lab results include: Lab Results  Component Value Date   WBC 7.6 11/14/2020   HGB 10.5 (L) 11/14/2020   HCT 33.9 (L) 11/14/2020   PLT 300 11/14/2020   GLUCOSE 133 (H) 11/14/2020   NA 137 11/14/2020   K 5.0 11/14/2020   CL 106 11/14/2020   CREATININE 1.11 11/14/2020   BUN 15 11/14/2020   CO2 25 11/14/2020     IMAGES: CXR 09/05/20: FINDINGS: - Cardiac silhouette top-normal in size. Stable changes from prior CABG surgery. No mediastinal or hilar masses. No evidence of adenopathy. - Chronic pleural thickening at the left lung base. - Lungs mildly hyperexpanded, but clear. - No pleural effusion or pneumothorax. - Skeletal structures are intact. IMPRESSION: No active cardiopulmonary disease.   EKG: EKG 08/22/2020 [facility]): Normal sinus rhythm at rate of 56 bpm, normal axis, right bundle branch block.  Inferior and lateral ST-T wave abnormality, consider LVH with repolarization, cannot exclude subendocardial infarct.     CV: As outlined in 08/22/20 note by Dr. Einar Gip: Echocardiogram 11/04/2017:LVEF 17%, grade 1 diastolic impairment, moderate concentric hypertrophy, normal left atrial pressure, moderately dilated left atrium, right ventricle is mildly dilated with normal function. Mild to moderate MR.  Stress Testing Lexiscan Myoview 12/20/2017:Gated SPECT imaging demonstrates hypokinesis of the apical anterior myocardial wall(s). The left ventricular ejection fraction was calculated or visually estimated to be 59%. SPECT images demonstrate small, fixed perfusion abnormality of  moderate intensity in the apical anterior myocardial wall(s). SPECT images also demonstrate small, reversible perfusion abnormality of mild intensity in mid inferior, basal inferior myocardium. This  suggests mild inferior ischemia, and possible anteroapical infarct. Low to intermediate risk study.  Lower extremity arterial duplex 11/05/2019:  Abnormal ABIs bilaterally suggestive of peripheral vascular disease.  Moderately abnormal pulse volume recordings at the level of bilateral ankles. Arterial duplex (right) demonstrates multiphasic waveform up to the popliteal artery. Posterior tibial artery and anterior tibial artery noted to have monophasic waveform. Arterial duplex (left) demonstrates multiphasic waveform except at the level of mid SFA. Mid SFA demonstrates a monophasic waveform with near occlusion and diffuse plaque. Conclusions: This exam reveals moderately decreased perfusion of the right lower extremity, noted at the post tibial artery level (ABI 0.67) and moderately decreased perfusion of the left lower extremity, noted at the post tibial artery level (ABI 0.73).   Past Medical History:  Diagnosis Date  . Claudication in peripheral vascular disease ([place])   . Hyperlipidemia   . Hypertension   . Lung cancer ([place])    neuroendocrine lung ca dx 2010  . Myocardial infarction ([place]) 07/2000  . Peripheral vascular disease ([place])   . Shortness of breath    on exertion,can't lay on his back  . Sleep apnea    cpap    Past Surgical History:  Procedure Laterality Date  . ACHILLES TENDON SURGERY    . ANTERIOR HIP REVISION Right 11/13/2020   Procedure: IRRIGATION AND DEBRIDEMENT RIGHT HIP;  Surgeon: Leandrew Koyanagi, MD;  Location: [facility];  Service: Orthopedics;  Laterality: Right;  . CORONARY ARTERY BYPASS GRAFT  08/18/2000   LIMA--dLIMA, RIMA-dRCA, left RA-OM2, SVG-D2  . FLEXIBLE SIGMOIDOSCOPY N/A 01/18/2013   Procedure: FLEXIBLE SIGMOIDOSCOPY;  Surgeon: Arta Silence, MD;  Location: [facility];  Service: Endoscopy;  Laterality: N/A;  . HERNIA REPAIR     inguinal  . HOT HEMOSTASIS N/A 01/18/2013   Procedure: HOT HEMOSTASIS (ARGON PLASMA COAGULATION/BICAP);  Surgeon: Arta Silence, MD;  Location: [facility];  Service: Endoscopy;  Laterality: N/A;  . LUNG SURGERY Left 01/29/2009   left lower lobectomy   . TOTAL HIP ARTHROPLASTY Right 09/16/2020   Procedure: RIGHT TOTAL HIP ARTHROPLASTY ANTERIOR APPROACH;  Surgeon: Leandrew Koyanagi, MD;  Location: [facility];  Service: Orthopedics;  Laterality: Right;    MEDICATIONS: . [START ON 12/05/2020] bupivacaine liposome (EXPAREL) 1.3 % injection 266 mg  . [START ON 12/05/2020] tranexamic acid (CYKLOKAPRON) 2,000 mg in sodium chloride 0.9 % 50 mL Topical Application   . amLODipine (NORVASC) 5 MG tablet  . aspirin EC 81 MG tablet  . clopidogrel (PLAVIX) 75 MG tablet  . daptomycin (CUBICIN) IVPB  . diphenhydramine-acetaminophen (TYLENOL PM) 25-500 MG TABS tablet  . ezetimibe (ZETIA) 10 MG tablet  . hydrALAZINE (APRESOLINE) 25 MG tablet  . metoprolol (LOPRESSOR) 50 MG tablet  . omeprazole (PRILOSEC) 20 MG capsule  . rifampin (RIFADIN) 300 MG capsule  . triamcinolone ointment (KENALOG) 0.1 %  . Vitamin D, Ergocalciferol, (DRISDOL) 50000 UNITS CAPS  . docusate sodium (COLACE) 100 MG capsule  . Evolocumab (REPATHA SURECLICK) 500 MG/ML SOAJ  . ondansetron (ZOFRAN) 4 MG tablet  . traZODone (DESYREL) 50 MG tablet    Myra Gianotti, PA-C Surgical Short Stay/Anesthesiology [facility] Phone [phone] [facility] Phone [phone] 12/04/2020 4:00 PM

## 2020-12-04 NOTE — Telephone Encounter (Signed)
Patient called back to [facility] treatment phone line.   He asked whether his wife needed to be tested. As she is asymptomatic, recommended she only test if she becomes symptomatic.   Sean Dubonnet, NP

## 2020-12-04 NOTE — Telephone Encounter (Signed)
Pt returned call. He is completely asymptomatic and as such does not require treatment. He verbalized understanding.   Loel Dubonnet, NP

## 2020-12-04 NOTE — Progress Notes (Signed)
We will get him the largest room possible for COVID patients.

## 2020-12-05 ENCOUNTER — Observation Stay (HOSPITAL_COMMUNITY)
Admission: RE | Admit: 2020-12-05 | Discharge: 2020-12-06 | Disposition: A | Discharge status: Home / Self Care | Payer: Medicare Other | Attending: Orthopaedic Surgery | Admitting: Orthopaedic Surgery

## 2020-12-05 ENCOUNTER — Observation Stay (HOSPITAL_COMMUNITY): Payer: Medicare Other | Admitting: Vascular Surgery

## 2020-12-05 ENCOUNTER — Encounter (HOSPITAL_COMMUNITY)
Admission: RE | Disposition: A | Discharge status: Home / Self Care | Payer: Self-pay | Source: Home / Self Care | Attending: Orthopaedic Surgery

## 2020-12-05 ENCOUNTER — Encounter (HOSPITAL_COMMUNITY): Payer: Self-pay | Admitting: Orthopaedic Surgery

## 2020-12-05 ENCOUNTER — Other Ambulatory Visit: Payer: Self-pay

## 2020-12-05 DIAGNOSIS — Z79899 Other long term (current) drug therapy: Secondary | ICD-10-CM | POA: Diagnosis not present

## 2020-12-05 DIAGNOSIS — T8131XA Disruption of external operation (surgical) wound, not elsewhere classified, initial encounter: Principal | ICD-10-CM | POA: Insufficient documentation

## 2020-12-05 DIAGNOSIS — I252 Old myocardial infarction: Secondary | ICD-10-CM | POA: Diagnosis not present

## 2020-12-05 DIAGNOSIS — I739 Peripheral vascular disease, unspecified: Secondary | ICD-10-CM | POA: Insufficient documentation

## 2020-12-05 DIAGNOSIS — Z85118 Personal history of other malignant neoplasm of bronchus and lung: Secondary | ICD-10-CM | POA: Insufficient documentation

## 2020-12-05 DIAGNOSIS — T8149XA Infection following a procedure, other surgical site, initial encounter: Secondary | ICD-10-CM

## 2020-12-05 DIAGNOSIS — Y848 Other medical procedures as the cause of abnormal reaction of the patient, or of later complication, without mention of misadventure at the time of the procedure: Secondary | ICD-10-CM | POA: Insufficient documentation

## 2020-12-05 DIAGNOSIS — I10 Essential (primary) hypertension: Secondary | ICD-10-CM | POA: Insufficient documentation

## 2020-12-05 DIAGNOSIS — E785 Hyperlipidemia, unspecified: Secondary | ICD-10-CM | POA: Insufficient documentation

## 2020-12-05 DIAGNOSIS — A4901 Methicillin susceptible Staphylococcus aureus infection, unspecified site: Secondary | ICD-10-CM

## 2020-12-05 HISTORY — PX: INCISION AND DRAINAGE HIP: SHX1801

## 2020-12-05 LAB — TYPE AND SCREEN
ABO/RH(D): O POS
Antibody Screen: NEGATIVE

## 2020-12-05 SURGERY — IRRIGATION AND DEBRIDEMENT HIP WITH POLY EXCHANGE
Anesthesia: General | Site: Hip | Laterality: Right

## 2020-12-05 MED ORDER — ONDANSETRON HCL 4 MG/2ML IJ SOLN: 4.0000 mg | Freq: Once | INTRAMUSCULAR | Status: DC | PRN

## 2020-12-05 MED ORDER — POLYETHYLENE GLYCOL 3350 17 G PO PACK
17.0000 g | PACK | Freq: Every day | ORAL | Status: DC
  Filled 2020-12-05: qty 1

## 2020-12-05 MED ORDER — EPHEDRINE SULFATE-NACL 50-0.9 MG/10ML-% IV SOSY
PREFILLED_SYRINGE | INTRAVENOUS | Status: DC | PRN
  Administered 2020-12-05: 5 mg via INTRAVENOUS

## 2020-12-05 MED ORDER — ONDANSETRON HCL 4 MG/2ML IJ SOLN: 4.0000 mg | Freq: Four times a day (QID) | INTRAMUSCULAR | Status: DC | PRN

## 2020-12-05 MED ORDER — ONDANSETRON HCL 4 MG/2ML IJ SOLN
INTRAMUSCULAR | Status: DC | PRN
  Administered 2020-12-05: 4 mg via INTRAVENOUS

## 2020-12-05 MED ORDER — ACETAMINOPHEN 160 MG/5ML PO SOLN: 1000.0000 mg | Freq: Once | ORAL | Status: DC

## 2020-12-05 MED ORDER — LACTATED RINGERS IV SOLN: INTRAVENOUS | Status: DC

## 2020-12-05 MED ORDER — SODIUM CHLORIDE 0.9 % IV SOLN
940.0000 mg | Freq: Every day | INTRAVENOUS | Status: DC
  Administered 2020-12-06: 940 mg via INTRAVENOUS
  Filled 2020-12-05 (×2): qty 18.8

## 2020-12-05 MED ORDER — MENTHOL 3 MG MT LOZG: 1.0000 | LOZENGE | OROMUCOSAL | Status: DC | PRN

## 2020-12-05 MED ORDER — DOCUSATE SODIUM 100 MG PO CAPS
100.0000 mg | ORAL_CAPSULE | Freq: Two times a day (BID) | ORAL | Status: DC
  Administered 2020-12-06: 100 mg via ORAL
  Filled 2020-12-05: qty 1

## 2020-12-05 MED ORDER — PHENOL 1.4 % MT LIQD: 1.0000 | OROMUCOSAL | Status: DC | PRN

## 2020-12-05 MED ORDER — TRANEXAMIC ACID-NACL 1000-0.7 MG/100ML-% IV SOLN
1000.0000 mg | Freq: Once | INTRAVENOUS | Status: AC
  Administered 2020-12-05: 1000 mg via INTRAVENOUS
  Filled 2020-12-05: qty 100

## 2020-12-05 MED ORDER — DAPTOMYCIN IV (FOR PTA / DISCHARGE USE ONLY): 940.0000 mg | INTRAVENOUS | Status: DC

## 2020-12-05 MED ORDER — HYDROMORPHONE HCL 1 MG/ML IJ SOLN: 0.5000 mg | INTRAMUSCULAR | Status: DC | PRN

## 2020-12-05 MED ORDER — ALUM & MAG HYDROXIDE-SIMETH 200-200-20 MG/5ML PO SUSP: 30.0000 mL | ORAL | Status: DC | PRN

## 2020-12-05 MED ORDER — FENTANYL CITRATE (PF) 100 MCG/2ML IJ SOLN
INTRAMUSCULAR | Status: AC
  Administered 2020-12-05: 50 ug via INTRAVENOUS
  Filled 2020-12-05: qty 2

## 2020-12-05 MED ORDER — MIDAZOLAM HCL 2 MG/2ML IJ SOLN
INTRAMUSCULAR | Status: AC
  Filled 2020-12-05: qty 2

## 2020-12-05 MED ORDER — METHOCARBAMOL 500 MG PO TABS: 500.0000 mg | ORAL_TABLET | Freq: Four times a day (QID) | ORAL | Status: DC | PRN

## 2020-12-05 MED ORDER — ACETAMINOPHEN 325 MG PO TABS: 325.0000 mg | ORAL_TABLET | Freq: Four times a day (QID) | ORAL | Status: DC | PRN

## 2020-12-05 MED ORDER — MAGNESIUM CITRATE PO SOLN: 1.0000 | Freq: Once | ORAL | Status: DC | PRN

## 2020-12-05 MED ORDER — SORBITOL 70 % SOLN
30.0000 mL | Freq: Every day | Status: DC | PRN
  Filled 2020-12-05: qty 30

## 2020-12-05 MED ORDER — DEXAMETHASONE SODIUM PHOSPHATE 10 MG/ML IJ SOLN
10.0000 mg | Freq: Once | INTRAMUSCULAR | Status: AC
  Administered 2020-12-06: 10 mg via INTRAVENOUS
  Filled 2020-12-05: qty 1

## 2020-12-05 MED ORDER — METOPROLOL TARTRATE 50 MG PO TABS
50.0000 mg | ORAL_TABLET | Freq: Two times a day (BID) | ORAL | Status: DC
  Administered 2020-12-05 – 2020-12-06 (×2): 50 mg via ORAL
  Filled 2020-12-05 (×2): qty 1

## 2020-12-05 MED ORDER — POVIDONE-IODINE 10 % EX SWAB: 2.0000 "application " | Freq: Once | CUTANEOUS | Status: DC

## 2020-12-05 MED ORDER — TRANEXAMIC ACID-NACL 1000-0.7 MG/100ML-% IV SOLN
1000.0000 mg | INTRAVENOUS | Status: AC
  Administered 2020-12-05: 1000 mg via INTRAVENOUS
  Filled 2020-12-05: qty 100

## 2020-12-05 MED ORDER — LIDOCAINE 2% (20 MG/ML) 5 ML SYRINGE
INTRAMUSCULAR | Status: DC | PRN
  Administered 2020-12-05: 100 mg via INTRAVENOUS

## 2020-12-05 MED ORDER — DIPHENHYDRAMINE HCL 12.5 MG/5ML PO ELIX
25.0000 mg | ORAL_SOLUTION | ORAL | Status: DC | PRN
  Filled 2020-12-05: qty 10

## 2020-12-05 MED ORDER — SUCCINYLCHOLINE CHLORIDE 20 MG/ML IJ SOLN
INTRAMUSCULAR | Status: DC | PRN
  Administered 2020-12-05: 120 mg via INTRAVENOUS

## 2020-12-05 MED ORDER — ROCURONIUM BROMIDE 10 MG/ML (PF) SYRINGE
PREFILLED_SYRINGE | INTRAVENOUS | Status: DC | PRN
  Administered 2020-12-05: 50 mg via INTRAVENOUS

## 2020-12-05 MED ORDER — CEFAZOLIN SODIUM-DEXTROSE 2-4 GM/100ML-% IV SOLN
2.0000 g | Freq: Four times a day (QID) | INTRAVENOUS | Status: AC
Start: 2020-12-05 — End: 2020-12-06
  Administered 2020-12-05 – 2020-12-06 (×2): 2 g via INTRAVENOUS
  Filled 2020-12-05 (×2): qty 100

## 2020-12-05 MED ORDER — VANCOMYCIN HCL 1000 MG IV SOLR: INTRAVENOUS | Status: DC | PRN

## 2020-12-05 MED ORDER — CEFAZOLIN SODIUM-DEXTROSE 2-4 GM/100ML-% IV SOLN
2.0000 g | INTRAVENOUS | Status: AC
  Administered 2020-12-05: 2 g via INTRAVENOUS
  Filled 2020-12-05: qty 100

## 2020-12-05 MED ORDER — RIFAMPIN 300 MG PO CAPS
300.0000 mg | ORAL_CAPSULE | Freq: Two times a day (BID) | ORAL | Status: DC
  Administered 2020-12-05 – 2020-12-06 (×2): 300 mg via ORAL
  Filled 2020-12-05 (×3): qty 1

## 2020-12-05 MED ORDER — OXYCODONE HCL 5 MG PO TABS: 10.0000 mg | ORAL_TABLET | ORAL | Status: DC | PRN

## 2020-12-05 MED ORDER — METOCLOPRAMIDE HCL 5 MG PO TABS
5.0000 mg | ORAL_TABLET | Freq: Three times a day (TID) | ORAL | Status: DC | PRN
  Filled 2020-12-05: qty 2

## 2020-12-05 MED ORDER — ACETAMINOPHEN 500 MG PO TABS
1000.0000 mg | ORAL_TABLET | Freq: Four times a day (QID) | ORAL | Status: DC
  Administered 2020-12-05 – 2020-12-06 (×3): 1000 mg via ORAL
  Filled 2020-12-05 (×3): qty 2

## 2020-12-05 MED ORDER — BUPIVACAINE-MELOXICAM ER 400-12 MG/14ML IJ SOLN
INTRAMUSCULAR | Status: AC
  Filled 2020-12-05: qty 1

## 2020-12-05 MED ORDER — SODIUM CHLORIDE 0.9 % IV SOLN: INTRAVENOUS | Status: DC

## 2020-12-05 MED ORDER — VANCOMYCIN HCL 1000 MG IV SOLR
INTRAVENOUS | Status: AC
  Filled 2020-12-05: qty 1000

## 2020-12-05 MED ORDER — FENTANYL CITRATE (PF) 100 MCG/2ML IJ SOLN: 25.0000 ug | INTRAMUSCULAR | Status: DC | PRN

## 2020-12-05 MED ORDER — PROMETHAZINE HCL 25 MG/ML IJ SOLN
INTRAMUSCULAR | Status: AC
  Administered 2020-12-05: 6.25 mg via INTRAVENOUS
  Filled 2020-12-05: qty 1

## 2020-12-05 MED ORDER — AMLODIPINE BESYLATE 5 MG PO TABS
5.0000 mg | ORAL_TABLET | Freq: Every day | ORAL | Status: DC
  Administered 2020-12-05 – 2020-12-06 (×2): 5 mg via ORAL
  Filled 2020-12-05 (×2): qty 1

## 2020-12-05 MED ORDER — PROMETHAZINE HCL 25 MG/ML IJ SOLN: 6.2500 mg | INTRAMUSCULAR | Status: DC | PRN

## 2020-12-05 MED ORDER — LACTATED RINGERS IV SOLN: INTRAVENOUS | Status: DC | PRN

## 2020-12-05 MED ORDER — ONDANSETRON HCL 4 MG PO TABS: 4.0000 mg | ORAL_TABLET | Freq: Four times a day (QID) | ORAL | Status: DC | PRN

## 2020-12-05 MED ORDER — PROPOFOL 10 MG/ML IV BOLUS
INTRAVENOUS | Status: DC | PRN
  Administered 2020-12-05: 180 mg via INTRAVENOUS

## 2020-12-05 MED ORDER — SODIUM CHLORIDE 0.9 % IR SOLN
Status: DC | PRN
  Administered 2020-12-05: 3000 mL

## 2020-12-05 MED ORDER — METHOCARBAMOL 1000 MG/10ML IJ SOLN
500.0000 mg | Freq: Four times a day (QID) | INTRAVENOUS | Status: DC | PRN
  Filled 2020-12-05: qty 5

## 2020-12-05 MED ORDER — ORAL CARE MOUTH RINSE: 15.0000 mL | Freq: Once | OROMUCOSAL | Status: AC

## 2020-12-05 MED ORDER — ACETAMINOPHEN 500 MG PO TABS
ORAL_TABLET | ORAL | Status: AC
  Filled 2020-12-05: qty 2

## 2020-12-05 MED ORDER — OXYCODONE HCL 5 MG PO TABS
5.0000 mg | ORAL_TABLET | ORAL | Status: DC | PRN
  Filled 2020-12-05: qty 1

## 2020-12-05 MED ORDER — SUGAMMADEX SODIUM 200 MG/2ML IV SOLN
INTRAVENOUS | Status: DC | PRN
  Administered 2020-12-05: 400 mg via INTRAVENOUS

## 2020-12-05 MED ORDER — MIDAZOLAM HCL 5 MG/5ML IJ SOLN
INTRAMUSCULAR | Status: DC | PRN
  Administered 2020-12-05: 2 mg via INTRAVENOUS

## 2020-12-05 MED ORDER — FENTANYL CITRATE (PF) 250 MCG/5ML IJ SOLN
INTRAMUSCULAR | Status: AC
  Filled 2020-12-05: qty 5

## 2020-12-05 MED ORDER — KETOROLAC TROMETHAMINE 15 MG/ML IJ SOLN
15.0000 mg | Freq: Four times a day (QID) | INTRAMUSCULAR | Status: AC
  Administered 2020-12-05 – 2020-12-06 (×4): 15 mg via INTRAVENOUS
  Filled 2020-12-05 (×4): qty 1

## 2020-12-05 MED ORDER — OXYCODONE HCL ER 10 MG PO T12A
10.0000 mg | EXTENDED_RELEASE_TABLET | Freq: Two times a day (BID) | ORAL | Status: DC
Start: 2020-12-05 — End: 2020-12-06
  Administered 2020-12-05 – 2020-12-06 (×2): 10 mg via ORAL
  Filled 2020-12-05 (×2): qty 1

## 2020-12-05 MED ORDER — DEXAMETHASONE SODIUM PHOSPHATE 10 MG/ML IJ SOLN
INTRAMUSCULAR | Status: DC | PRN
  Administered 2020-12-05: 5 mg via INTRAVENOUS

## 2020-12-05 MED ORDER — CHLORHEXIDINE GLUCONATE 0.12 % MT SOLN
15.0000 mL | Freq: Once | OROMUCOSAL | Status: AC
  Administered 2020-12-05: 15 mL via OROMUCOSAL
  Filled 2020-12-05: qty 15

## 2020-12-05 MED ORDER — FENTANYL CITRATE (PF) 100 MCG/2ML IJ SOLN
INTRAMUSCULAR | Status: DC | PRN
  Administered 2020-12-05 (×2): 50 ug via INTRAVENOUS
  Administered 2020-12-05: 25 ug via INTRAVENOUS
  Administered 2020-12-05: 50 ug via INTRAVENOUS
  Administered 2020-12-05: 25 ug via INTRAVENOUS
  Administered 2020-12-05: 50 ug via INTRAVENOUS

## 2020-12-05 MED ORDER — METOCLOPRAMIDE HCL 5 MG/ML IJ SOLN: 5.0000 mg | Freq: Three times a day (TID) | INTRAMUSCULAR | Status: DC | PRN

## 2020-12-05 MED ORDER — ACETAMINOPHEN 500 MG PO TABS
1000.0000 mg | ORAL_TABLET | Freq: Once | ORAL | Status: AC
  Administered 2020-12-05: 1000 mg via ORAL
  Filled 2020-12-05: qty 2

## 2020-12-05 SURGICAL SUPPLY — 46 items
COVER SURGICAL LIGHT HANDLE (MISCELLANEOUS) ×3 IMPLANT
COVER WAND RF STERILE (DRAPES) ×1 IMPLANT
DRAPE IMP U-DRAPE 54X76 (DRAPES) ×3 IMPLANT
DRAPE INCISE IOBAN 85X60 (DRAPES) IMPLANT
DRESSING PREVENA PLUS CUSTOM (GAUZE/BANDAGES/DRESSINGS) IMPLANT
DRSG AQUACEL AG ADV 3.5X10 (GAUZE/BANDAGES/DRESSINGS) IMPLANT
DRSG MEPILEX BORDER 4X8 (GAUZE/BANDAGES/DRESSINGS) IMPLANT
DRSG PREVENA PLUS CUSTOM (GAUZE/BANDAGES/DRESSINGS) ×3
DURAPREP 26ML APPLICATOR (WOUND CARE) ×3 IMPLANT
ELECT CAUTERY BLADE 6.4 (BLADE) ×3 IMPLANT
ELECT REM PT RETURN 9FT ADLT (ELECTROSURGICAL) ×3
ELECTRODE REM PT RTRN 9FT ADLT (ELECTROSURGICAL) ×1 IMPLANT
EVACUATOR 1/8 PVC DRAIN (DRAIN) IMPLANT
FACESHIELD WRAPAROUND (MASK) ×3 IMPLANT
FACESHIELD WRAPAROUND OR TEAM (MASK) IMPLANT
GLOVE ECLIPSE 7.0 STRL STRAW (GLOVE) ×3 IMPLANT
GLOVE SKINSENSE NS SZ7.5 (GLOVE) ×4
GLOVE SKINSENSE STRL SZ7.5 (GLOVE) ×2 IMPLANT
GLOVE SURG UNDER POLY LF SZ7 (GLOVE) ×3 IMPLANT
GOWN STRL REIN XL XLG (GOWN DISPOSABLE) ×3 IMPLANT
HANDPIECE INTERPULSE COAX TIP (DISPOSABLE) ×3
HOOD PEEL AWAY FLYTE STAYCOOL (MISCELLANEOUS) ×4 IMPLANT
KIT BASIN OR (CUSTOM PROCEDURE TRAY) ×3 IMPLANT
KIT TURNOVER KIT B (KITS) ×3 IMPLANT
MANIFOLD NEPTUNE II (INSTRUMENTS) ×3 IMPLANT
NS IRRIG 1000ML POUR BTL (IV SOLUTION) ×3 IMPLANT
PACK SHOULDER (CUSTOM PROCEDURE TRAY) ×3 IMPLANT
PACK UNIVERSAL I (CUSTOM PROCEDURE TRAY) ×3 IMPLANT
PAD ARMBOARD 7.5X6 YLW CONV (MISCELLANEOUS) ×6 IMPLANT
SET HNDPC FAN SPRY TIP SCT (DISPOSABLE) ×1 IMPLANT
STAPLER VISISTAT 35W (STAPLE) IMPLANT
SUT ETHILON 2 0 FS 18 (SUTURE) ×6 IMPLANT
SUT ETHILON 2 0 PSLX (SUTURE) ×2 IMPLANT
SUT MON AB 2-0 CT1 36 (SUTURE) ×6 IMPLANT
SUT MON AB 2-0 SH 27 (SUTURE)
SUT MON AB 2-0 SH27 (SUTURE) IMPLANT
SUT PDS AB 1 CT  36 (SUTURE)
SUT PDS AB 1 CT 36 (SUTURE) ×2 IMPLANT
SUT VIC AB 0 CT1 27 (SUTURE)
SUT VIC AB 0 CT1 27XBRD ANBCTR (SUTURE) IMPLANT
SUT VIC AB 1 CTB1 27 (SUTURE) IMPLANT
SUT VIC AB 2-0 CT1 27 (SUTURE)
SUT VIC AB 2-0 CT1 TAPERPNT 27 (SUTURE) IMPLANT
TOWEL GREEN STERILE (TOWEL DISPOSABLE) ×3 IMPLANT
TOWEL GREEN STERILE FF (TOWEL DISPOSABLE) ×3 IMPLANT
YANKAUER SUCT BULB TIP NO VENT (SUCTIONS) ×2 IMPLANT

## 2020-12-05 NOTE — Anesthesia Postprocedure Evaluation (Signed)
Anesthesia Post Note  Patient: Sean Gardner  Procedure(s) Performed: IRRIGATION AND DEBRIDEMENT RIGHT HIP (Right Hip)     Patient location during evaluation: PACU Anesthesia Type: General Level of consciousness: awake and alert Pain management: pain level controlled Vital Signs Assessment: post-procedure vital signs reviewed and stable Respiratory status: spontaneous breathing, nonlabored ventilation, respiratory function stable and patient connected to nasal cannula oxygen Cardiovascular status: blood pressure returned to baseline and stable Postop Assessment: no apparent nausea or vomiting Anesthetic complications: no   No complications documented.  Last Vitals:  Vitals:   12/05/20 1816 12/05/20 1830  BP: (!) 106/93 137/76  Pulse: 68 64  Resp: 17 12  Temp:    SpO2: 100% 93%    Last Pain:  Vitals:   12/05/20 1830  TempSrc:   PainSc: Asleep                 Catalina Gravel

## 2020-12-05 NOTE — H&P (Signed)

## 2020-12-05 NOTE — Op Note (Signed)
   Date of Surgery: 12/05/2020  INDICATIONS: Mr. Sean Gardner is a 65 y.o.-year-old male who underwent irrigation debridement of a superficial postoperative wound infection 2 weeks ago who presented back with persistent drainage and wound dehiscence.  Recommendation was for returning back to the operating room and excisional debridement and wound closure.  The patient did consent to the procedure after discussion of the risks and benefits.  PREOPERATIVE DIAGNOSIS: Right hip postoperative wound dehiscence  POSTOPERATIVE DIAGNOSIS: Same.  PROCEDURE:  1.  Excisional debridement of postoperative wound dehiscence including skin, subcutaneous tissue 80 cm 2.  Secondary closure of right hip postoperative wound dehiscence 3.  Application of incisional wound VAC  SURGEON: N. Eduard Roux, M.D.  ASSIST: Ciro Backer [place], [place]; necessary for the timely completion of procedure and due to complexity of procedure.  ANESTHESIA:  general  IV FLUIDS AND URINE: See anesthesia.  ESTIMATED BLOOD LOSS: 100 mL.  IMPLANTS: None  DRAINS: Incisional wound VAC  COMPLICATIONS: see description of procedure.  DESCRIPTION OF PROCEDURE: The patient was brought to the operating room.  The patient had been signed prior to the procedure and this was documented. The patient had the anesthesia placed by the anesthesiologist.  A time-out was performed to confirm that this was the correct patient, site, side and location. The patient did receive antibiotics prior to the incision and was re-dosed during the procedure as needed at indicated intervals.  .  The patient had the operative extremity prepped and draped in the standard surgical fashion.    The previous surgical incision was incised.  The dehisced skin was excised in a full-thickness manner back to healthy bleeding skin edges.  Excisional debridement was performed including the skin and the underlying subcutaneous tissue with a knife and rondure.  There is no evidence  of purulence or deep infection.  Overall the appearance was consistent with poor tissue healing and fat necrosis.  Copious irrigation was performed using Pulsavac.  Secondary closure of the wound dehiscence was performed in a layered fashion using 2-0 Monocryl for the subcutaneous layer and 2-0 nylon for the skin.  A [name] incisional wound VAC was placed on the incision.  Patient tolerated procedure well had no immediate complications.  Tawanna Cooler was necessary for opening, closing, retracting, limb positioning and overall facilitation and timely completion of the procedure.  POSTOPERATIVE PLAN: Patient will be admitted overnight for pain control and mobilization with PT.  Anticipate discharging home tomorrow.  He will keep the [place] incisional on for a week and we will see him next week in the office to remove this.  Azucena Cecil, MD 7:40 PM

## 2020-12-05 NOTE — Anesthesia Procedure Notes (Signed)
Procedure Name: Intubation Date/Time: 12/05/2020 4:46 PM Performed by: Amadeo Garnet, CRNA Pre-anesthesia Checklist: Patient identified, Emergency Drugs available, Suction available and Patient being monitored Patient Re-evaluated:Patient Re-evaluated prior to induction Oxygen Delivery Method: Circle system utilized Preoxygenation: Pre-oxygenation with 100% oxygen Induction Type: IV induction and Rapid sequence Laryngoscope Size: Mac and 4 Grade View: Grade I Tube type: Oral Tube size: 7.5 mm Number of attempts: 1 Airway Equipment and Method: Stylet and Oral airway Placement Confirmation: ETT inserted through vocal cords under direct vision,  positive ETCO2 and breath sounds checked- equal and bilateral Secured at: 23 cm Tube secured with: Tape Dental Injury: Teeth and Oropharynx as per pre-operative assessment

## 2020-12-05 NOTE — Transfer of Care (Signed)
Immediate Anesthesia Transfer of Care Note  Patient: [name]  Procedure(s) Performed: IRRIGATION AND DEBRIDEMENT RIGHT HIP (Right Hip)  Patient Location: PACU  Anesthesia Type:General  Level of Consciousness: awake, alert  and oriented  Airway & Oxygen Therapy: Patient Spontanous Breathing and Patient connected to face mask oxygen  Post-op Assessment: Report given to RN, Post -op Vital signs reviewed and stable and Patient moving all extremities  Post vital signs: Reviewed and stable  Last Vitals:  Vitals Value Taken Time  BP 142/85 12/05/20 1802  Temp 36.1 C 12/05/20 1802  Pulse 64 12/05/20 1808  Resp 13 12/05/20 1808  SpO2 100 % 12/05/20 1808  Vitals shown include unvalidated device data.  Last Pain:  Vitals:   12/05/20 1434  TempSrc: Oral  PainSc: 0-No pain      Patients Stated Pain Goal: 3 (25/42/70 6237)  Complications: No complications documented.

## 2020-12-06 ENCOUNTER — Telehealth: Payer: Self-pay

## 2020-12-06 ENCOUNTER — Encounter (HOSPITAL_COMMUNITY): Payer: Self-pay | Admitting: Orthopaedic Surgery

## 2020-12-06 ENCOUNTER — Other Ambulatory Visit: Payer: Self-pay | Admitting: Physician Assistant

## 2020-12-06 DIAGNOSIS — T8131XA Disruption of external operation (surgical) wound, not elsewhere classified, initial encounter: Secondary | ICD-10-CM | POA: Diagnosis not present

## 2020-12-06 LAB — CBC
HCT: 33.2 % — ABNORMAL LOW (ref 39.0–52.0)
Hemoglobin: 10.1 g/dL — ABNORMAL LOW (ref 13.0–17.0)
MCH: 29.9 pg (ref 26.0–34.0)
MCHC: 30.4 g/dL (ref 30.0–36.0)
MCV: 98.2 fL (ref 80.0–100.0)
Platelets: 262 10*3/uL (ref 150–400)
RBC: 3.38 MIL/uL — ABNORMAL LOW (ref 4.22–5.81)
RDW: 16.3 % — ABNORMAL HIGH (ref 11.5–15.5)
WBC: 5 10*3/uL (ref 4.0–10.5)
nRBC: 0 % (ref 0.0–0.2)

## 2020-12-06 LAB — BASIC METABOLIC PANEL
Anion gap: 8 (ref 5–15)
BUN: 10 mg/dL (ref 8–23)
CO2: 22 mmol/L (ref 22–32)
Calcium: 8.8 mg/dL — ABNORMAL LOW (ref 8.9–10.3)
Chloride: 107 mmol/L (ref 98–111)
Creatinine, Ser: 0.92 mg/dL (ref 0.61–1.24)
GFR, Estimated: >60 mL/min (ref >60)
Glucose, Bld: 101 mg/dL — ABNORMAL HIGH (ref 70–99)
Potassium: 4.4 mmol/L (ref 3.5–5.1)
Sodium: 137 mmol/L (ref 135–145)

## 2020-12-06 MED ORDER — DAPTOMYCIN IV (FOR PTA / DISCHARGE USE ONLY): 940.0000 mg | INTRAVENOUS | 0 refills | Status: AC

## 2020-12-06 MED ORDER — HYDROCODONE-ACETAMINOPHEN 5-325 MG PO TABS: 1.0000 | ORAL_TABLET | Freq: Three times a day (TID) | ORAL | 0 refills | Status: DC | PRN

## 2020-12-06 NOTE — Telephone Encounter (Signed)
Patient will need more pain medication when discharged sent to [facility] in [place].  Thanks!

## 2020-12-06 NOTE — Progress Notes (Addendum)
Subjective: 1 Day Post-Op Procedure(s) (LRB): IRRIGATION AND DEBRIDEMENT RIGHT HIP (Right) Patient reports pain as mild.  Doing well this am and ready to go home  Objective: Vital signs in last 24 hours: Temp:  [97 F (36.1 C)-98.2 F (36.8 C)] 97.6 F (36.4 C) (02/18 0806) Pulse Rate:  [53-68] 62 (02/18 0806) Resp:  [12-18] 16 (02/18 0806) BP: (105-183)/(61-93) 134/61 (02/18 0806) SpO2:  [93 %-100 %] 96 % (02/18 0806) Weight:  [117.9 kg] 117.9 kg (02/17 1434)  Intake/Output from previous day: 02/17 0701 - 02/18 0700 In: 700 [I.V.:600; IV Piggyback:100] Out: 850 [Urine:850] Intake/Output this shift: No intake/output data recorded.  Recent Labs    12/06/20 0108  HGB 10.1*   Recent Labs    12/06/20 0108  WBC 5.0  RBC 3.38*  HCT 33.2*  PLT 262   Recent Labs    12/06/20 0108  NA 137  K 4.4  CL 107  CO2 22  BUN 10  CREATININE 0.92  GLUCOSE 101*  CALCIUM 8.8*   No results for input(s): LABPT, INR in the last 72 hours.  Neurologically intact Neurovascular intact Sensation intact distally Intact pulses distally Dorsiflexion/Plantar flexion intact Incision: dressing C/D/I No cellulitis present Compartment soft  prevena in place and working properly.  No fluid in canister   Assessment/Plan: 1 Day Post-Op Procedure(s) (LRB): IRRIGATION AND DEBRIDEMENT RIGHT HIP (Right) Advance diet D/C IV fluids Discharge home with home health  [place] to resume plavix and asa Continue with prevena at d/c F/u with Dr. Erlinda Hong within 7 days from surgery Has adequate post-op meds at home     [facility] 12/06/2020, 8:20 AM

## 2020-12-06 NOTE — Telephone Encounter (Signed)
Sent in

## 2020-12-06 NOTE — Evaluation (Signed)
Physical Therapy Evaluation & Discharge Patient Details Name: Sean Gardner MRN: [id number] DOB: [birth year] Today's Date: 12/06/2020   History of Present Illness  Pt is a 65 y.o. male admitted 12/05/20 for repeat I&D of R hip wound infection; had incidentally tested (+) COVID-19 in preparation for procedure. S/p R hip debridement and wound vac placement 2/17. PMH includes OSA, HTN, PVD, lung CA, R THA (08/2020, s/p revision 11/13/20).    Clinical Impression  Patient evaluated by Physical Therapy with no further acute PT needs identified. PTA, pt independent, drives, lives with supportive wife. Today, pt independent with transfers, ambulation and ADL tasks. Educ re: precautions, positioning, therex/ROM, portable wound vac use. All education has been completed and the patient has no further questions. Acute PT is signing off. Thank you for this referral.    Follow Up Recommendations Outpatient PT (resume)    Equipment Recommendations  None recommended by PT    Recommendations for Other Services       Precautions / Restrictions Precautions Precautions: Other (comment) Precaution Comments: R hip portable wound vac Restrictions Weight Bearing Restrictions: Yes RLE Weight Bearing: Weight bearing as tolerated      Mobility  Bed Mobility               General bed mobility comments: received sitting EOB    Transfers Overall transfer level: Independent Equipment used: None                Ambulation/Gait Ambulation/Gait assistance: Independent Social research officer, government (Feet): 400 Feet Assistive device: None;IV Pole Gait Pattern/deviations: Step-through pattern;Decreased stride length Gait velocity: Decreased   General Gait Details: Slow, steady gait indep with and without pushing IV pole; no overt instability or LOB noted  Stairs            Wheelchair Mobility    Modified Rankin (Stroke Patients Only)       Balance Overall balance assessment: Modified Independent                                            Pertinent Vitals/Pain Pain Assessment: No/denies pain    Home Living Family/patient expects to be discharged to:: Private residence Living Arrangements: Spouse/significant other Available Help at Discharge: Family;Available 24 hours/day Type of Home: House Home Access: Stairs to enter Entrance Stairs-Rails: Right Entrance Stairs-Number of Steps: 2-3 Home Layout: One level Home Equipment: Walker - 2 wheels;Shower seat;Cane - single point Additional Comments: Has HHRN 1x/wk to check RUE port    Prior Function Level of Independence: Independent         Comments: Indep without DME, drives, helps care for mother in law, stands for showers; was working with OP PT     Hand Dominance        Extremity/Trunk Assessment   Upper Extremity Assessment Upper Extremity Assessment: Overall WFL for tasks assessed    Lower Extremity Assessment Lower Extremity Assessment: RLE deficits/detail RLE Deficits / Details: S/p R hip I&D w/ wound vac placement; functionally >3/5 throughout    Cervical / Trunk Assessment Cervical / Trunk Assessment: Normal  Communication   Communication: No difficulties  Cognition Arousal/Alertness: Awake/alert Behavior During Therapy: WFL for tasks assessed/performed Overall Cognitive Status: Within Functional Limits for tasks assessed  General Comments General comments (skin integrity, edema, etc.): Educ re: portable wound vac use/wear    Exercises     Assessment/Plan    PT Assessment [place] does not need any further PT services  PT Problem List         PT Treatment Interventions      PT Goals (Current goals can be found in the Care Plan section)  Acute Rehab PT Goals PT Goal Formulation: All assessment and education complete, DC therapy    Frequency     Barriers to discharge        Co-evaluation                AM-PAC PT "6 Clicks" Mobility  Outcome Measure Help needed turning from your back to your side while in a flat bed without using bedrails?: None Help needed moving from lying on your back to sitting on the side of a flat bed without using bedrails?: None Help needed moving to and from a bed to a chair (including a wheelchair)?: None Help needed standing up from a chair using your arms (e.g., wheelchair or bedside chair)?: None Help needed to walk in hospital room?: None Help needed climbing 3-5 steps with a railing? : None 6 Click Score: 24    End of Session   Activity Tolerance: Patient tolerated treatment well Patient left: in chair;with call bell/phone within reach Nurse Communication: Mobility status PT Visit Diagnosis: Unsteadiness on feet (R26.81);Difficulty in walking, not elsewhere classified (R26.2)    Time: 6378-5885 PT Time Calculation (min) (ACUTE ONLY): 20 min   Charges:   PT Evaluation $PT Eval Low Complexity: [place], PT, DPT Acute Rehabilitation Services  Pager [phone] Office [place] 12/06/2020, 10:04 AM

## 2020-12-06 NOTE — TOC Transition Note (Signed)
Transition of Care [facility]) - CM/SW Discharge Note   Patient Details  Name: Sean Gardner MRN: [id number] Date of Birth: 1956-10-03  Transition of Care [facility]) CM/SW Contact:  Sharin Mons, RN Phone Number: 12/06/2020, 2:13 PM   Clinical Narrative:    Patient will DC to: home Anticipated DC date: 12/06/2020 Family notified: yes, wife Transport by: car  Presents with postoperative wound infection of right hip. Pt from home with wife. Active with [facility] and [facility] prior to current hospital stay Per MD patient ready for DC today. RN, patient, patient's wife, and home health agencies ( [facility]) notified of DC for resumption of services. Pt with DME orders noted, pt states already has @ home.  Pt without Rx med concerns or affordability issues.  Post hospital f/u appointment noted on AVS.  RNCM will sign off for now as intervention is no longer needed. Please consult [name] again if new needs arise.    Final next level of care: Home w Home Health Services Barriers to Discharge: No Barriers Identified   Patient Goals and CMS Choice     Choice offered to / list presented to : Patient  Discharge Placement                       Discharge Plan and Services                DME Arranged: Other see comment ([facility]/ IV ABX therapy)   Date DME Agency Contacted: 12/06/20 Time DME Agency Contacted: [phone] Representative spoke with at DME Agency: [place]: PT,RN [place]: [place] ([place]) Date [place]: 12/06/20 Time [place]: [phone] Representative spoke with at [place]: [place]  Social Determinants of Health ([place]) Interventions     Readmission Risk Interventions No flowsheet data found.

## 2020-12-06 NOTE — Plan of Care (Signed)
  Problem: Education: Goal: Knowledge of risk factors and measures for prevention of condition will improve Outcome: Progressing   Problem: Coping: Goal: Psychosocial and spiritual needs will be supported Outcome: Progressing   Problem: Respiratory: Goal: Will maintain a patent airway Outcome: Progressing   

## 2020-12-06 NOTE — Discharge Summary (Signed)
Patient ID: Sean Gardner MRN: [id number] DOB/AGE: May 11, 1956 65 y.o.  Admit date: 12/05/2020 Discharge date: 12/06/2020  Admission Diagnoses:  Principal Problem:   Postoperative wound infection of right hip   Discharge Diagnoses:  Same  Past Medical History:  Diagnosis Date  . Claudication in peripheral vascular disease ([place])   . Hyperlipidemia   . Hypertension   . Lung cancer ([place])    neuroendocrine lung ca dx 2010  . Myocardial infarction ([place]) 07/2000  . Peripheral vascular disease ([place])   . Shortness of breath    on exertion,can't lay on his back  . Sleep apnea    cpap    Surgeries: Procedure(s): IRRIGATION AND DEBRIDEMENT RIGHT HIP on 12/05/2020   Consultants:   Discharged Condition: Improved  Hospital Course: Sean Gardner is an 65 y.o. male who was admitted 12/05/2020 for operative treatment ofPostoperative wound infection of right hip. Patient has severe unremitting pain that affects sleep, daily activities, and work/hobbies. After pre-op clearance the patient was taken to the operating room on 12/05/2020 and underwent  Procedure(s): IRRIGATION AND DEBRIDEMENT RIGHT HIP.    Patient was given perioperative antibiotics:  Anti-infectives (From admission, onward)   Start     Dose/Rate Route Frequency Ordered Stop   12/06/20 0600  ceFAZolin (ANCEF) IVPB 2g/100 mL premix        2 g 200 mL/hr over 30 Minutes Intravenous On call to O.R. 12/05/20 1443 12/05/20 1705   12/06/20 0000  daptomycin (CUBICIN) IVPB        940 mg Intravenous Every 24 hours 12/06/20 1332 12/26/20 2359   12/05/20 2200  rifampin (RIFADIN) capsule 300 mg        300 mg Oral 2 times daily 12/05/20 1827     12/05/20 2200  ceFAZolin (ANCEF) IVPB 2g/100 mL premix        2 g 200 mL/hr over 30 Minutes Intravenous Every 6 hours 12/05/20 1827 12/06/20 0620   12/05/20 2100  DAPTOmycin (CUBICIN) 940 mg in sodium chloride 0.9 % IVPB        940 mg 237.6 mL/hr over 30 Minutes Intravenous Daily 12/05/20 1925  12/26/20 1959   12/05/20 1830  daptomycin (CUBICIN) IVPB  Status:  Discontinued       Note to Pharmacy: Indication:  MRSA surgical site infection First Dose: Yes Last Day of Therapy:  12/25/20 Labs - Once weekly:  CBC/D, BMP, and CPK Labs - Every other week:  ESR and CRP Method of administration: IV Push Method of administration may be changed at the discretion of home infusion pharmacist based up   940 mg Intravenous Every 24 hours 12/05/20 1827 12/05/20 1925   12/05/20 1658  vancomycin (VANCOCIN) powder  Status:  Discontinued          As needed 12/05/20 1658 12/05/20 1809       Patient was given sequential compression devices, early ambulation, and chemoprophylaxis to prevent DVT.  Patient benefited maximally from hospital stay and there were no complications.    Recent vital signs:  Patient Vitals for the past 24 hrs:  BP Temp Temp src Pulse Resp SpO2 Height Weight  12/06/20 1206 (!) 126/59 98.2 F (36.8 C) Oral 61 17 92 % -- --  12/06/20 0806 134/61 97.6 F (36.4 C) Oral 62 16 96 % -- --  12/06/20 0400 119/62 98 F (36.7 C) Oral 60 17 98 % -- --  12/06/20 0100 -- -- -- -- -- 96 % -- --  12/06/20 0000 105/64 97.6 F (  36.4 C) Axillary (!) 53 17 95 % -- --  12/05/20 2300 130/66 97.9 F (36.6 C) Oral (!) 57 17 95 % -- --  12/05/20 2200 126/65 (!) 97.3 F (36.3 C) Oral (!) 58 18 -- -- --  12/05/20 2100 133/66 97.7 F (36.5 C) Oral 66 18 96 % -- --  12/05/20 2000 (!) 144/75 97.6 F (36.4 C) Oral 67 17 98 % -- --  12/05/20 1830 137/76 -- -- 64 12 93 % -- --  12/05/20 1816 (!) 106/93 -- -- 68 17 100 % -- --  12/05/20 1802 (!) 142/85 (!) 97 F (36.1 C) -- 65 15 100 % -- --  12/05/20 1522 (!) 144/85 -- -- 68 -- 99 % -- --  12/05/20 1434 (!) 183/68 98.2 F (36.8 C) Oral 66 -- 98 % 6' (1.829 m) 117.9 kg     Recent laboratory studies:  Recent Labs    12/06/20 0108  WBC 5.0  HGB 10.1*  HCT 33.2*  PLT 262  NA 137  K 4.4  CL 107  CO2 22  BUN 10  CREATININE 0.92   GLUCOSE 101*  CALCIUM 8.8*     Discharge Medications:   Allergies as of 12/06/2020      Reactions   Lisinopril Other (See Comments)   Hyperkalemia   Zolpidem Tartrate Other (See Comments)   Felt fuzzy and hallucinations      Medication List    TAKE these medications   amLODipine 5 MG tablet Commonly known as: NORVASC Take 1 tablet (5 mg total) by mouth daily. What changed: when to take this   aspirin EC 81 MG tablet Take 81 mg by mouth daily. Swallow whole.   clopidogrel 75 MG tablet Commonly known as: PLAVIX Take 1 tablet (75 mg total) by mouth daily before breakfast.   daptomycin  IVPB Commonly known as: CUBICIN Inject 940 mg into the vein daily. Indication:  MRSA surgical site infection First Dose: Yes Last Day of Therapy:  12/25/20 Labs - Once weekly:  CBC/D, BMP, and CPK Labs - Every other week:  ESR and CRP Method of administration: IV Push Method of administration may be changed at the discretion of home infusion pharmacist based upon assessment of the patient and/or caregiver's ability to self-administer the medication ordered. What changed: Another medication with the same name was added. Make sure you understand how and when to take each.   daptomycin  IVPB Commonly known as: CUBICIN Inject 940 mg into the vein daily for 20 days. Indication:  MRSA deep tissue surgical site infection  First Dose: No Last Day of Therapy:  12/25/2020 Labs - Once weekly:  CBC/D, BMP, and CPK Labs - Every other week:  ESR and CRP Method of administration: IV Push Method of administration may be changed at the discretion of home infusion pharmacist based upon assessment of the patient and/or caregiver's ability to self-administer the medication ordered. What changed: You were already taking a medication with the same name, and this prescription was added. Make sure you understand how and when to take each.   diphenhydramine-acetaminophen 25-500 MG Tabs tablet Commonly known as:  TYLENOL PM Take 2 tablets by mouth at bedtime.   docusate sodium 100 MG capsule Commonly known as: Colace Take 1 capsule (100 mg total) by mouth daily as needed.   ezetimibe 10 MG tablet Commonly known as: ZETIA Take 10 mg by mouth daily.   hydrALAZINE 25 MG tablet Commonly known as: APRESOLINE TAKE 1 TABLET BY  MOUTH THREE TIMES A DAY   HYDROcodone-acetaminophen 5-325 MG tablet Commonly known as: Norco Take 1-2 tablets by mouth 3 (three) times daily as needed.   metoprolol tartrate 50 MG tablet Commonly known as: LOPRESSOR Take 50 mg by mouth 2 (two) times daily. Hold if systolic blood pressure (top blood pressure number) less than 100 mmHg or heart rate less than 60 bpm (pulse).   omeprazole 20 MG capsule Commonly known as: PRILOSEC Take 20 mg by mouth daily.   ondansetron 4 MG tablet Commonly known as: Zofran Take 1 tablet (4 mg total) by mouth every 8 (eight) hours as needed for nausea or vomiting.   Repatha SureClick 443 MG/ML [place] Generic drug: Evolocumab Inject 140 mg into the skin every 14 (fourteen) days.   rifampin 300 MG capsule Commonly known as: Rifadin Take 1 capsule (300 mg total) by mouth 2 (two) times daily.   traZODone 50 MG tablet Commonly known as: DESYREL Take 1 tablet (50 mg total) by mouth at bedtime.   triamcinolone ointment 0.1 % Commonly known as: KENALOG Apply 1 application topically 2 (two) times daily.   Vitamin D (Ergocalciferol) 1.25 MG (50000 UNIT) Caps capsule Commonly known as: DRISDOL Take 50,000 Units by mouth every Saturday.            Durable Medical Equipment  (From admission, onward)         Start     Ordered   12/05/20 1828  DME Walker rolling  Once       Question:  Patient needs a walker to treat with the following condition  Answer:  History of hip replacement   12/05/20 1827   12/05/20 1828  DME 3 n 1  Once        12/05/20 1827   12/05/20 1828  DME Bedside commode  Once       Question:  Patient needs a  bedside commode to treat with the following condition  Answer:  History of hip replacement   12/05/20 1827           Discharge Care Instructions  (From admission, onward)         Start     Ordered   12/06/20 0000  Change dressing on IV access line weekly and PRN  (Home infusion instructions - [facility] )        12/06/20 1332          Diagnostic Studies: DG C-Arm 1-60 Min  Result Date: 11/13/2020 CLINICAL DATA:  Surgery, elective. Additional history provided: Irrigation and debridement right hip with possible head and poly exchange. Provided fluoroscopy time 5 seconds (0.57 mGy). EXAM: OPERATIVE right HIP (WITH PELVIS IF PERFORMED) 1 VIEWS TECHNIQUE: Fluoroscopic spot image(s) were submitted for interpretation post-operatively. COMPARISON:  Radiographs of the right hip 11/13/2020 FINDINGS: A single PA view intraoperative fluoroscopic image of the right hip is submitted. Right total hip arthroplasty. The femoral and acetabular components appear well seated. Correlate with the procedural history. IMPRESSION: Single intraoperative fluoroscopic image of the right hip, as described. Electronically Signed   By: Kellie Simmering DO   On: 11/13/2020 18:16   DG HIP OPERATIVE UNILAT W OR W/O PELVIS RIGHT  Result Date: 11/13/2020 CLINICAL DATA:  Surgery, elective. Additional history provided: Irrigation and debridement right hip with possible head and poly exchange. Provided fluoroscopy time 5 seconds (0.57 mGy). EXAM: OPERATIVE right HIP (WITH PELVIS IF PERFORMED) 1 VIEWS TECHNIQUE: Fluoroscopic spot image(s) were submitted for interpretation post-operatively. COMPARISON:  Radiographs of  the right hip 11/13/2020 FINDINGS: A single PA view intraoperative fluoroscopic image of the right hip is submitted. Right total hip arthroplasty. The femoral and acetabular components appear well seated. Correlate with the procedural history. IMPRESSION: Single intraoperative fluoroscopic image of the right  hip, as described. Electronically Signed   By: Kellie Simmering DO   On: 11/13/2020 18:16   XR HIP UNILAT W OR W/O PELVIS 2-3 VIEWS RIGHT  Result Date: 11/13/2020 Stable right total hip replacement without complication.  [name] EKG SITE RITE  Result Date: 11/14/2020 If Site Rite image not attached, placement could not be confirmed due to current cardiac rhythm.   Disposition: Discharge disposition: 01-Home or Self Care       Discharge Instructions    [facility] pharmacist to adjust dose for Vancomycin, Aminoglycosides and other anti-infective therapies as requested by physician.   Complete by: As directed    [facility] to provide Cath Flo 61m   Complete by: As directed    Administer for PICC line occlusion and as ordered by physician for other access device issues.   Anaphylaxis Kit: Provided to treat any anaphylactic reaction to the medication being provided to the patient if First Dose or when requested by physician   Complete by: As directed    Epinephrine 141mml vial / amp: Administer 0.67m26m0.67ml56mubcutaneously once for moderate to severe anaphylaxis, nurse to call physician and pharmacy when reaction occurs and call 911 if needed for immediate care   Diphenhydramine 50mg19mIV vial: Administer 25-50mg 69mM PRN for first dose reaction, rash, itching, mild reaction, nurse to call physician and pharmacy when reaction occurs   Sodium Chloride 0.9% NS 500ml I47mdminister if needed for hypovolemic blood pressure drop or as ordered by physician after call to physician with anaphylactic reaction   Change dressing on IV access line weekly and PRN   Complete by: As directed    Flush IV access with Sodium Chloride 0.9% and Heparin 10 units/ml or 100 units/ml   Complete by: As directed    Home infusion instructions - [facility]   Complete by: As directed    Instructions: Flush IV access with Sodium Chloride 0.9% and Heparin 10units/ml or 100units/ml    Change dressing on IV access line: Weekly and PRN   Instructions Cath Flo 2mg: Ad88mister for PICC Line occlusion and as ordered by physician for other access device   [facility] pharmacist to adjust dose for: Vancomycin, Aminoglycosides and other anti-infective therapies as requested by physician   Method of administration may be changed at the discretion of home infusion pharmacist based upon assessment of the patient and/or caregiver's ability to self-administer the medication ordered   Complete by: As directed    Outpatient Parenteral Antibiotic Therapy Information Antibiotic: Daptomycin (Cubicin) IVPB; Indications for use: surgical site wound infection; End Date: 01/17/2021 Also for rifampin Patient on both antibiotics per ID Unsure of end date as this is p...   Complete by: As directed    Also for rifampin Patient on both antibiotics per ID Unsure of end date as this is per ID   Antibiotic: Daptomycin (Cubicin) IVPB   Indications for use: surgical site wound infection   End Date: 01/17/2021       Follow-up Information    Xu, NaipLeandrew Koyanagi1 week.   Specialty: Orthopedic Surgery Why: For wound re-check Contact information: [id number]        [facility] Follow up.   Specialty: Home Health Services               Signed: Aundra Dubin 12/06/2020, 1:33 PM

## 2020-12-06 NOTE — Telephone Encounter (Signed)
Patient called he stated he is still in the hospital since this morning he is still waiting to be released he stated the last thing he heard was his case worker is filling out his paperwork he would like a call back : [phone]

## 2020-12-06 NOTE — Discharge Instructions (Signed)
INSTRUCTIONS AFTER JOINT REPLACEMENT   o Remove items at home which could result in a fall. This includes throw rugs or furniture in walking pathways o ICE to the affected joint every three hours while awake for 30 minutes at a time, for at least the first 3-5 days, and then as needed for pain and swelling.  Continue to use ice for pain and swelling. You may notice swelling that will progress down to the foot and ankle.  This is normal after surgery.  Elevate your leg when you are not up walking on it.   o Continue to use the breathing machine you got in the hospital (incentive spirometer) which will help keep your temperature down.  It is common for your temperature to cycle up and down following surgery, especially at night when you are not up moving around and exerting yourself.  The breathing machine keeps your lungs expanded and your temperature down.   DIET:  As you were doing prior to hospitalization, we recommend a well-balanced diet.  DRESSING / WOUND CARE / SHOWERING  Continue pevena wound vac at home.  ACTIVITY  o Increase activity slowly as tolerated, but follow the weight bearing instructions below.   o No driving for 6 weeks or until further direction given by your physician.  You cannot drive while taking narcotics.  o No lifting or carrying greater than 10 lbs. until further directed by your surgeon. o Avoid periods of inactivity such as sitting longer than an hour when not asleep. This helps prevent blood clots.  o You may return to work once you are authorized by your doctor.     WEIGHT BEARING   Weight bearing as tolerated with assist device (walker, cane, etc) as directed, use it as long as suggested by your surgeon or therapist, typically at least 4-6 weeks.   EXERCISES  Results after joint replacement surgery are often greatly improved when you follow the exercise, range of motion and muscle strengthening exercises prescribed by your doctor. Safety measures are  also important to protect the joint from further injury. Any time any of these exercises cause you to have increased pain or swelling, decrease what you are doing until you are comfortable again and then slowly increase them. If you have problems or questions, call your caregiver or physical therapist for advice.   Rehabilitation is important following a joint replacement. After just a few days of immobilization, the muscles of the leg can become weakened and shrink (atrophy).  These exercises are designed to build up the tone and strength of the thigh and leg muscles and to improve motion. Often times heat used for twenty to thirty minutes before working out will loosen up your tissues and help with improving the range of motion but do not use heat for the first two weeks following surgery (sometimes heat can increase post-operative swelling).   These exercises can be done on a training (exercise) mat, on the floor, on a table or on a bed. Use whatever works the best and is most comfortable for you.    Use music or television while you are exercising so that the exercises are a pleasant break in your day. This will make your life better with the exercises acting as a break in your routine that you can look forward to.   Perform all exercises about fifteen times, three times per day or as directed.  You should exercise both the operative leg and the other leg as well.  Exercises include:   . Quad Sets - Tighten up the muscle on the front of the thigh (Quad) and hold for 5-10 seconds.   . Straight Leg Raises - With your knee straight (if you were given a brace, keep it on), lift the leg to 60 degrees, hold for 3 seconds, and slowly lower the leg.  Perform this exercise against resistance later as your leg gets stronger.  . Leg Slides: Lying on your back, slowly slide your foot toward your buttocks, bending your knee up off the floor (only go as far as is comfortable). Then slowly slide your foot back down  until your leg is flat on the floor again.  [place] Haring Wings: Lying on your back spread your legs to the side as far apart as you can without causing discomfort.  . Hamstring Strength:  Lying on your back, push your heel against the floor with your leg straight by tightening up the muscles of your buttocks.  Repeat, but this time bend your knee to a comfortable angle, and push your heel against the floor.  You may put a pillow under the heel to make it more comfortable if necessary.   A rehabilitation program following joint replacement surgery can speed recovery and prevent re-injury in the future due to weakened muscles. Contact your doctor or a physical therapist for more information on knee rehabilitation.    CONSTIPATION  Constipation is defined medically as fewer than three stools per week and severe constipation as less than one stool per week.  Even if you have a regular bowel pattern at home, your normal regimen is likely to be disrupted due to multiple reasons following surgery.  Combination of anesthesia, postoperative narcotics, change in appetite and fluid intake all can affect your bowels.   YOU MUST use at least one of the following options; they are listed in order of increasing strength to get the job done.  They are all available over the counter, and you may need to use some, POSSIBLY even all of these options:    Drink plenty of fluids (prune juice may be helpful) and high fiber foods Colace 100 mg by mouth twice a day  Senokot for constipation as directed and as needed Dulcolax (bisacodyl), take with full glass of water  Miralax (polyethylene glycol) once or twice a day as needed.  If you have tried all these things and are unable to have a bowel movement in the first 3-4 days after surgery call either your surgeon or your primary doctor.    If you experience loose stools or diarrhea, hold the medications until you stool forms back up.  If your symptoms do not get better within  1 week or if they get worse, check with your doctor.  If you experience "the worst abdominal pain ever" or develop nausea or vomiting, please contact the office immediately for further recommendations for treatment.   ITCHING:  If you experience itching with your medications, try taking only a single pain pill, or even half a pain pill at a time.  You can also use Benadryl over the counter for itching or also to help with sleep.   TED HOSE STOCKINGS:  Use stockings on both legs until for at least 2 weeks or as directed by physician office. They may be removed at night for sleeping.  MEDICATIONS:  See your medication summary on the "After Visit Summary" that nursing will review with you.  You may have some home medications which will  be placed on hold until you complete the course of blood thinner medication.  It is important for you to complete the blood thinner medication as prescribed.  PRECAUTIONS:  If you experience chest pain or shortness of breath - call 911 immediately for transfer to the hospital emergency department.   If you develop a fever greater that 101 F, purulent drainage from wound, increased redness or drainage from wound, foul odor from the wound/dressing, or calf pain - CONTACT YOUR SURGEON.                                                   FOLLOW-UP APPOINTMENTS:  If you do not already have a post-op appointment, please call the office for an appointment to be seen by your surgeon.  Guidelines for how soon to be seen are listed in your "After Visit Summary", but are typically between 1-4 weeks after surgery.  OTHER INSTRUCTIONS:   Knee Replacement:  Do not place pillow under knee, focus on keeping the knee straight while resting. CPM instructions: 0-90 degrees, 2 hours in the morning, 2 hours in the afternoon, and 2 hours in the evening. Place foam block, curve side up under heel at all times except when in CPM or when walking.  DO NOT modify, tear, cut, or change the foam block  in any way.  MAKE SURE YOU:  . Understand these instructions.  . Get help right away if you are not doing well or get worse.    Thank you for letting us be a part of your medical care team.  It is a privilege we respect greatly.  We hope these instructions will help you stay on track for a fast and full recovery!

## 2020-12-06 NOTE — Telephone Encounter (Signed)
I spoke with Jamse Arn, RN and she stated she would call to follow up on getting patient discharged.

## 2020-12-06 NOTE — Progress Notes (Signed)
Pt managed pain well overnight.  Had 12 hour ER oxy and scheduled tylenol. Placed pt on 2 LNC oxygen at 0100 due to his home use of a CPAP.  Not required when awake.  Slept well. Reports no pain at 0600 after waking.  Call bell in reach.

## 2020-12-06 NOTE — Progress Notes (Signed)
Discharge instructions reviewed with patient including medications, follow up appts, and post-op instructions.  All questions answered to pt satisfaction.  V/s stable.  No signs of distress.  All personal items bagged and sent with pt.  Pt transported via wheelchair to POV for discharge home.

## 2020-12-06 NOTE — Telephone Encounter (Signed)
FYI

## 2020-12-09 NOTE — Telephone Encounter (Signed)
Ok, the case manager called while in surgery Friday afternoon and needed a new opat order.  As soon as she called I signed it.  Hopefully he did not have to stay too much longer after that

## 2020-12-09 NOTE — H&P (Signed)

## 2020-12-10 NOTE — H&P (Signed)
PREOPERATIVE H&P  Chief Complaint: right hip wound  HPI: Sean Gardner is a 65 y.o. male who presents for surgical treatment of right hip wound.  He denies any changes in medical history.  Past Medical History:  Diagnosis Date  . Claudication in peripheral vascular disease ([place])   . Hyperlipidemia   . Hypertension   . Lung cancer ([place])    neuroendocrine lung ca dx 2010  . Myocardial infarction ([place]) 07/2000  . Peripheral vascular disease ([place])   . Shortness of breath    on exertion,can't lay on his back  . Sleep apnea    cpap   Past Surgical History:  Procedure Laterality Date  . ACHILLES TENDON SURGERY    . ANTERIOR HIP REVISION Right 11/13/2020   Procedure: IRRIGATION AND DEBRIDEMENT RIGHT HIP;  Surgeon: Leandrew Koyanagi, MD;  Location: [facility];  Service: Orthopedics;  Laterality: Right;  . CORONARY ARTERY BYPASS GRAFT  08/18/2000   LIMA--dLIMA, RIMA-dRCA, left RA-OM2, SVG-D2  . FLEXIBLE SIGMOIDOSCOPY N/A 01/18/2013   Procedure: FLEXIBLE SIGMOIDOSCOPY;  Surgeon: Arta Silence, MD;  [facility];  Service: Endoscopy;  Laterality: N/A;  . HERNIA REPAIR     inguinal  . HOT HEMOSTASIS N/A 01/18/2013   Procedure: HOT HEMOSTASIS (ARGON PLASMA COAGULATION/BICAP);  Surgeon: Arta Silence, MD;  Location[facility];  Service: Endoscopy;  Laterality: N/A;  . INCISION AND DRAINAGE HIP Right 12/05/2020   Procedure: IRRIGATION AND DEBRIDEMENT RIGHT HIP;  Surgeon: Leandrew Koyanagi, MD;  Location: [facility];  Service: Orthopedics;  Laterality: Right;  . LUNG SURGERY Left 01/29/2009   left lower lobectomy   . TOTAL HIP ARTHROPLASTY Right 09/16/2020   Procedure: RIGHT TOTAL HIP ARTHROPLASTY ANTERIOR APPROACH;  Surgeon: Leandrew Koyanagi, MD;  Location: [facility];  Service: Orthopedics;  Laterality: Right;   Social History   Socioeconomic History  . Marital status: Married    Spouse name: Not on file  . Number of children: 2  . Years of education: Not on file  . Highest education level:  Not on file  Occupational History  . Not on file  Tobacco Use  . Smoking status: Former Smoker    Packs/day: 2.00    Years: 20.00    Pack years: 40.00    Types: Cigarettes    Quit date: 10/19/2008    Years since quitting: 12.1  . Smokeless tobacco: Never Used  Vaping Use  . Vaping Use: Never used  Substance and Sexual Activity  . Alcohol use: Yes    Comment: occ  . Drug use: No  . Sexual activity: Not on file  Other Topics Concern  . Not on file  Social History Narrative  . Not on file   Social Determinants of Health   Financial Resource Strain: Not on file  Food Insecurity: Not on file  Transportation Needs: Not on file  Physical Activity: Not on file  Stress: Not on file  Social Connections: Not on file   Family History  Problem Relation Age of Onset  . Hypertension Mother   . Lung cancer Father    Allergies  Allergen Reactions  . Lisinopril Other (See Comments)    Hyperkalemia  . Zolpidem Tartrate Other (See Comments)    Felt fuzzy and hallucinations   Prior to Admission medications   Medication Sig Start Date End Date Taking? Authorizing Provider  amLODipine (NORVASC) 5 MG tablet Take 1 tablet (5 mg total) by mouth daily. Patient taking differently: Take 5 mg by mouth  in the morning and at bedtime. 02/14/20 09/03/20 Yes Miquel Dunn, NP  aspirin EC 81 MG tablet Take 81 mg by mouth daily. Swallow whole.   Yes [provider]  clopidogrel (PLAVIX) 75 MG tablet Take 1 tablet (75 mg total) by mouth daily before breakfast. 01/19/13  Yes Arta Silence, MD  daptomycin (CUBICIN) IVPB Inject 940 mg into the vein daily. Indication:  MRSA surgical site infection First Dose: Yes Last Day of Therapy:  12/25/20 Labs - Once weekly:  CBC/D, BMP, and CPK Labs - Every other week:  ESR and CRP Method of administration: IV Push Method of administration may be changed at the discretion of home infusion pharmacist based upon assessment of the patient and/or  caregiver's ability to self-administer the medication ordered. 11/18/20 12/26/20 Yes Aundra Dubin, PA-C  daptomycin (CUBICIN) IVPB Inject 940 mg into the vein daily for 20 days. Indication:  MRSA deep tissue surgical site infection  First Dose: No Last Day of Therapy:  12/25/2020 Labs - Once weekly:  CBC/D, BMP, and CPK Labs - Every other week:  ESR and CRP Method of administration: IV Push Method of administration may be changed at the discretion of home infusion pharmacist based upon assessment of the patient and/or caregiver's ability to self-administer the medication ordered. 12/06/20 12/26/20 Yes Aundra Dubin, PA-C  diphenhydramine-acetaminophen (TYLENOL PM) 25-500 MG TABS tablet Take 2 tablets by mouth at bedtime.   Yes [provider]  ezetimibe (ZETIA) 10 MG tablet Take 10 mg by mouth daily.   Yes [provider]  hydrALAZINE (APRESOLINE) 25 MG tablet TAKE 1 TABLET BY MOUTH THREE TIMES A DAY Patient taking differently: Take 25 mg by mouth 3 (three) times daily. 07/22/20  Yes Tolia, Sunit, DO  HYDROcodone-acetaminophen (NORCO) 5-325 MG tablet Take 1-2 tablets by mouth 3 (three) times daily as needed. 12/06/20   Aundra Dubin, PA-C  metoprolol (LOPRESSOR) 50 MG tablet Take 50 mg by mouth 2 (two) times daily. Hold if systolic blood pressure (top blood pressure number) less than 100 mmHg or heart rate less than 60 bpm (pulse).   Yes [provider]  omeprazole (PRILOSEC) 20 MG capsule Take 20 mg by mouth daily.   Yes [provider]  rifampin (RIFADIN) 300 MG capsule Take 1 capsule (300 mg total) by mouth 2 (two) times daily. 11/18/20 12/26/20 Yes Aundra Dubin, PA-C  triamcinolone ointment (KENALOG) 0.1 % Apply 1 application topically 2 (two) times daily. 11/12/20  Yes [provider]  Vitamin D, Ergocalciferol, (DRISDOL) 50000 UNITS CAPS Take 50,000 Units by mouth every Saturday.    Yes [provider]  docusate sodium (COLACE) 100 MG  capsule Take 1 capsule (100 mg total) by mouth daily as needed. Patient not taking: Reported on 12/03/2020 09/10/20 09/10/21  Aundra Dubin, PA-C  Evolocumab (REPATHA SURECLICK) 350 MG/ML SOAJ Inject 140 mg into the skin every 14 (fourteen) days. 09/09/20   Adrian Prows, MD  ondansetron (ZOFRAN) 4 MG tablet Take 1 tablet (4 mg total) by mouth every 8 (eight) hours as needed for nausea or vomiting. 09/10/20   Aundra Dubin, PA-C  traZODone (DESYREL) 50 MG tablet Take 1 tablet (50 mg total) by mouth at bedtime. 10/08/20   Leandrew Koyanagi, MD     Positive ROS: All other systems have been reviewed and were otherwise negative with the exception of those mentioned in the HPI and as above.  Physical Exam: General: Alert, no acute distress Cardiovascular: No pedal  edema Respiratory: No cyanosis, no use of accessory musculature GI: abdomen soft Skin: No lesions in the area of chief complaint Neurologic: Sensation intact distally Psychiatric: Patient is competent for consent with normal mood and affect Lymphatic: no lymphedema  MUSCULOSKELETAL: exam stable  Assessment: right hip wound  Plan: Plan for Procedure(s): IRRIGATION AND DEBRIDEMENT RIGHT HIP  The risks benefits and alternatives were discussed with the patient including but not limited to the risks of nonoperative treatment, versus surgical intervention including infection, bleeding, nerve injury,  blood clots, cardiopulmonary complications, morbidity, mortality, among others, and they were willing to proceed.   Preoperative templating of the joint replacement has been completed, documented, and submitted to the Operating Room personnel in order to optimize intra-operative equipment management.   Eduard Roux, MD 12/10/2020 7:21 PM

## 2020-12-11 ENCOUNTER — Telehealth: Payer: Self-pay | Admitting: Orthopaedic Surgery

## 2020-12-11 ENCOUNTER — Telehealth (INDEPENDENT_AMBULATORY_CARE_PROVIDER_SITE_OTHER): Payer: Medicare Other | Admitting: Internal Medicine

## 2020-12-11 ENCOUNTER — Other Ambulatory Visit: Payer: Self-pay

## 2020-12-11 DIAGNOSIS — A4902 Methicillin resistant Staphylococcus aureus infection, unspecified site: Secondary | ICD-10-CM

## 2020-12-11 DIAGNOSIS — T8450XD Infection and inflammatory reaction due to unspecified internal joint prosthesis, subsequent encounter: Secondary | ICD-10-CM

## 2020-12-11 NOTE — Progress Notes (Signed)
.  Virtual Visit via Telephone Note  I connected with Sean Gardner on 12/11/20 at  3:00 PM EST by telephone and verified that I am speaking with the correct person using two identifiers.  Location: Patient: at home Provider: at clinic   I discussed the limitations, risks, security and privacy concerns of performing an evaluation and management service by telephone and the availability of in person appointments. I also discussed with the patient that there may be a patient responsible charge related to this service. The patient expressed understanding and agreed to proceed.    HPI 65yo M with right wound/right prosthetic hip, on daptomycin plus rifampin  covid + on 2/15. No symptoms from covid. isolated since diagnosed.  He went to the OR On 2/17  1.  Excisional debridement of postoperative wound dehiscence including skin, subcutaneous tissue 80 cm 2.  Secondary closure of right hip postoperative wound dehiscence 3.  Application of incisional wound VAC  Supposed to see dr Erlinda Hong this week.   Outpatient Encounter Medications as of 12/11/2020  Medication Sig  . HYDROcodone-acetaminophen (NORCO) 5-325 MG tablet Take 1-2 tablets by mouth 3 (three) times daily as needed.  Marland Kitchen amLODipine (NORVASC) 5 MG tablet Take 1 tablet (5 mg total) by mouth daily. (Patient taking differently: Take 5 mg by mouth in the morning and at bedtime.)  . aspirin EC 81 MG tablet Take 81 mg by mouth daily. Swallow whole.  . clopidogrel (PLAVIX) 75 MG tablet Take 1 tablet (75 mg total) by mouth daily before breakfast.  . daptomycin (CUBICIN) IVPB Inject 940 mg into the vein daily. Indication:  MRSA surgical site infection First Dose: Yes Last Day of Therapy:  12/25/20 Labs - Once weekly:  CBC/D, BMP, and CPK Labs - Every other week:  ESR and CRP Method of administration: IV Push Method of administration may be changed at the discretion of home infusion pharmacist based upon assessment of the patient and/or  caregiver's ability to self-administer the medication ordered.  . daptomycin (CUBICIN) IVPB Inject 940 mg into the vein daily for 20 days. Indication:  MRSA deep tissue surgical site infection  First Dose: No Last Day of Therapy:  12/25/2020 Labs - Once weekly:  CBC/D, BMP, and CPK Labs - Every other week:  ESR and CRP Method of administration: IV Push Method of administration may be changed at the discretion of home infusion pharmacist based upon assessment of the patient and/or caregiver's ability to self-administer the medication ordered.  . diphenhydramine-acetaminophen (TYLENOL PM) 25-500 MG TABS tablet Take 2 tablets by mouth at bedtime.  . docusate sodium (COLACE) 100 MG capsule Take 1 capsule (100 mg total) by mouth daily as needed. (Patient not taking: Reported on 12/03/2020)  . Evolocumab (REPATHA SURECLICK) 469 MG/ML SOAJ Inject 140 mg into the skin every 14 (fourteen) days.  Marland Kitchen ezetimibe (ZETIA) 10 MG tablet Take 10 mg by mouth daily.  . hydrALAZINE (APRESOLINE) 25 MG tablet TAKE 1 TABLET BY MOUTH THREE TIMES A DAY (Patient taking differently: Take 25 mg by mouth 3 (three) times daily.)  . metoprolol (LOPRESSOR) 50 MG tablet Take 50 mg by mouth 2 (two) times daily. Hold if systolic blood pressure (top blood pressure number) less than 100 mmHg or heart rate less than 60 bpm (pulse).  Marland Kitchen omeprazole (PRILOSEC) 20 MG capsule Take 20 mg by mouth daily.  . ondansetron (ZOFRAN) 4 MG tablet Take 1 tablet (4 mg total) by mouth every 8 (eight) hours as needed for nausea or vomiting.  Marland Kitchen  rifampin (RIFADIN) 300 MG capsule Take 1 capsule (300 mg total) by mouth 2 (two) times daily.  . traZODone (DESYREL) 50 MG tablet Take 1 tablet (50 mg total) by mouth at bedtime.  . triamcinolone ointment (KENALOG) 0.1 % Apply 1 application topically 2 (two) times daily.  . Vitamin D, Ergocalciferol, (DRISDOL) 50000 UNITS CAPS Take 50,000 Units by mouth every Saturday.    No facility-administered encounter  medications on file as of 12/11/2020.     Patient Active Problem List   Diagnosis Date Noted  . Staphylococcus aureus infection   . Status post total hip replacement, right 11/13/2020  . Postoperative wound infection of right hip 11/13/2020  . Status post total replacement of right hip 09/16/2020  . Hyperkalemia 01/10/2020  . Bone metastasis ([place]) 09/05/2018  . Sepsis secondary to UTI ([place]) 05/11/2016  . CAD (coronary artery disease) 05/11/2016  . Hyperlipidemia 05/11/2016  . Morbid (severe) obesity due to excess calories ([place]) 03/21/2016  . Sleep apnea 09/18/2015  . Dyspnea and respiratory abnormality 09/18/2015  . Dyspnea 10/11/2012  . Essential hypertension 10/11/2012  . Lung cancer ([place]) 03/23/2012     Health Maintenance Due  Topic Date Due  . Hepatitis C Screening  Never done  . COLONOSCOPY (Pts 45-69yr Insurance coverage will need to be confirmed)  Never done  . PNA vac Low Risk Adult (1 of 2 - PCV13) 01/23/2019  . COVID-19 Vaccine (3 - Pfizer risk 4-dose series) 12/27/2019     Review of Systems  Physical Exam   CBC Lab Results  Component Value Date   WBC 5.0 12/06/2020   RBC 3.38 (L) 12/06/2020   HGB 10.1 (L) 12/06/2020   HCT 33.2 (L) 12/06/2020   PLT 262 12/06/2020   MCV 98.2 12/06/2020   MCH 29.9 12/06/2020   MCHC 30.4 12/06/2020   RDW 16.3 (H) 12/06/2020   LYMPHSABS 1,223 11/13/2020   MONOABS 0.6 09/05/2020   EOSABS 143 11/13/2020    BMET Lab Results  Component Value Date   NA 137 12/06/2020   K 4.4 12/06/2020   CL 107 12/06/2020   CO2 22 12/06/2020   GLUCOSE 101 (H) 12/06/2020   BUN 10 12/06/2020   CREATININE 0.92 12/06/2020   CALCIUM 8.8 (L) 12/06/2020   GFRNONAA >60 12/06/2020   GFRAA 95 01/09/2020    Methicillin resistant staphylococcus aureus      MIC    CIPROFLOXACIN <=0.5 SENSI... Sensitive    CLINDAMYCIN <=0.25 SENS... Sensitive    ERYTHROMYCIN >=8 RESISTANT  Resistant    GENTAMICIN <=0.5 SENSI... Sensitive    Inducible  Clindamycin NEGATIVE  Sensitive    OXACILLIN >=4 RESISTANT  Resistant    RIFAMPIN <=0.5 SENSI... Sensitive    TETRACYCLINE <=1 SENSITIVE  Sensitive    TRIMETH/SULFA <=10 SENSIT... Sensitive    VANCOMYCIN <=0.5 SENSI... Sensitive      Assessment and Plan  Through end of march with iv dapto, and oral rifampin/need to check labs to see when can end abtx. Extended abtx through march 24th., remove port quickly.  [place]   Follow Up Instructions:    I discussed the assessment and treatment plan with the patient. The patient was provided an opportunity to ask questions and all were answered. The patient agreed with the plan and demonstrated an understanding of the instructions.   The patient was advised to call back or seek an in-person evaluation if the symptoms worsen or if the condition fails to improve as anticipated.  I provided 10 minutes of non-face-to-face time during this encounter.   Carlyle Basques, MD

## 2020-12-11 NOTE — Telephone Encounter (Signed)
Pt called stating he has so many appts resulting from his surgery and he just can't keep up with them anymore. He states he's getting a bit overwhelmed and wants to speak with someone.

## 2020-12-11 NOTE — Telephone Encounter (Signed)
Spoke to patient. He will come in for appt tomorrow.

## 2020-12-12 ENCOUNTER — Other Ambulatory Visit: Payer: Self-pay | Admitting: Cardiology

## 2020-12-12 ENCOUNTER — Encounter: Payer: Self-pay | Admitting: Orthopaedic Surgery

## 2020-12-12 ENCOUNTER — Inpatient Hospital Stay: Payer: Medicare Other | Admitting: Orthopaedic Surgery

## 2020-12-12 ENCOUNTER — Ambulatory Visit (INDEPENDENT_AMBULATORY_CARE_PROVIDER_SITE_OTHER): Payer: Medicare Other | Admitting: Orthopaedic Surgery

## 2020-12-12 VITALS — Ht 72.0 in | Wt 260.0 lb

## 2020-12-12 DIAGNOSIS — I10 Essential (primary) hypertension: Secondary | ICD-10-CM

## 2020-12-12 DIAGNOSIS — T8149XA Infection following a procedure, other surgical site, initial encounter: Secondary | ICD-10-CM

## 2020-12-12 NOTE — Progress Notes (Signed)
Post-Op Visit Note   Patient: Sean Gardner           Date of Birth: [birth year]           MRN: [id number] Visit Date: 12/12/2020 PCP: [name], MD   Assessment & Plan:  Chief Complaint:  Chief Complaint  Patient presents with  . Right Hip - Follow-up    I&D right hip 12/05/2020   Visit Diagnoses:  1. Postoperative wound infection of right hip     Plan:   [name] returns today for his 1 week postoperative wound check.  Overall doing well.  The Prevena wound VAC has been working well.  He has no complaints or concerns.  Right hip surgical site is intact.  The incision does not demonstrate any evidence of dehiscence per infection or problems.  A new [name] incisional VAC was applied today.  We will plan to keep this on for another week.  Recheck next week  Follow-Up Instructions: Return in about 1 week (around 12/19/2020).   Orders:  No orders of the defined types were placed in this encounter.  No orders of the defined types were placed in this encounter.   Imaging: No results found.  PMFS History: Patient Active Problem List   Diagnosis Date Noted  . Staphylococcus aureus infection   . Status post total hip replacement, right 11/13/2020  . Postoperative wound infection of right hip 11/13/2020  . Status post total replacement of right hip 09/16/2020  . Hyperkalemia 01/10/2020  . Bone metastasis ([place]) 09/05/2018  . Sepsis secondary to UTI ([place]) 05/11/2016  . CAD (coronary artery disease) 05/11/2016  . Hyperlipidemia 05/11/2016  . Morbid (severe) obesity due to excess calories ([place]) 03/21/2016  . Sleep apnea 09/18/2015  . Dyspnea and respiratory abnormality 09/18/2015  . Dyspnea 10/11/2012  . Essential hypertension 10/11/2012  . Lung cancer ([place]) 03/23/2012   Past Medical History:  Diagnosis Date  . Claudication in peripheral vascular disease ([place])   . Hyperlipidemia   . Hypertension   . Lung cancer ([place])    neuroendocrine lung ca dx 2010  .  Myocardial infarction ([place]) 07/2000  . Peripheral vascular disease ([place])   . Shortness of breath    on exertion,can't lay on his back  . Sleep apnea    cpap    Family History  Problem Relation Age of Onset  . Hypertension Mother   . Lung cancer Father     Past Surgical History:  Procedure Laterality Date  . ACHILLES TENDON SURGERY    . ANTERIOR HIP REVISION Right 11/13/2020   Procedure: IRRIGATION AND DEBRIDEMENT RIGHT HIP;  Surgeon: Leandrew Koyanagi, MD;  Location: [facility];  Service: Orthopedics;  Laterality: Right;  . CORONARY ARTERY BYPASS GRAFT  08/18/2000   LIMA--dLIMA, RIMA-dRCA, left RA-OM2, SVG-D2  . FLEXIBLE SIGMOIDOSCOPY N/A 01/18/2013   Procedure: FLEXIBLE SIGMOIDOSCOPY;  Surgeon: Arta Silence, MD;  [facility];  Service: Endoscopy;  Laterality: N/A;  . HERNIA REPAIR     inguinal  . HOT HEMOSTASIS N/A 01/18/2013   Procedure: HOT HEMOSTASIS (ARGON PLASMA COAGULATION/BICAP);  Surgeon: Arta Silence, MD;  Location: [facility];  Service: Endoscopy;  Laterality: N/A;  . INCISION AND DRAINAGE HIP Right 12/05/2020   Procedure: IRRIGATION AND DEBRIDEMENT RIGHT HIP;  Surgeon: Leandrew Koyanagi, MD;  Location: [facility];  Service: Orthopedics;  Laterality: Right;  . LUNG SURGERY Left 01/29/2009   left lower lobectomy   . TOTAL HIP ARTHROPLASTY Right 09/16/2020   Procedure: RIGHT TOTAL  HIP ARTHROPLASTY ANTERIOR APPROACH;  Surgeon: Leandrew Koyanagi, MD;  Location: [facility];  Service: Orthopedics;  Laterality: Right;   Social History   Occupational History  . Not on file  Tobacco Use  . Smoking status: Former Smoker    Packs/day: 2.00    Years: 20.00    Pack years: 40.00    Types: Cigarettes    Quit date: 10/19/2008    Years since quitting: 12.1  . Smokeless tobacco: Never Used  Vaping Use  . Vaping Use: Never used  Substance and Sexual Activity  . Alcohol use: Yes    Comment: occ  . Drug use: No  . Sexual activity: Not on file

## 2020-12-13 ENCOUNTER — Telehealth: Payer: Self-pay

## 2020-12-13 ENCOUNTER — Inpatient Hospital Stay: Payer: Medicare Other | Admitting: Orthopaedic Surgery

## 2020-12-13 ENCOUNTER — Ambulatory Visit: Payer: Medicare Other | Admitting: Orthopaedic Surgery

## 2020-12-13 LAB — FUNGUS CULTURE WITH STAIN

## 2020-12-13 LAB — FUNGUS CULTURE RESULT

## 2020-12-13 LAB — FUNGAL ORGANISM REFLEX

## 2020-12-13 NOTE — Telephone Encounter (Signed)
FYI  Pt s/p hip I&D 12/05/20 was in the office yesterday and had a prevena vac replaced in office. Pt called with concerns of vac alarming.  The light indicators at the top were not on per patient and he states that the device is fully charged.  The canister is not full in fact pt states that there is nothing in there at all. I advised the pt to turn off the vac and turn back on. I can hear through the phone that the vac is suctioning and working without alarm. The pt states that it is "lound and my wife and I could not hear TV last night" I advised the pt that if there is not an alarm,if the indicator lights at the top that show a blockage or improper seal are not on if the device is charged its functioning properly. I did offer the pt an apt to come in today nurse only visit to take a look and reassure that this is working properly but the pt declined. Will call with any other questions.

## 2020-12-13 NOTE — Telephone Encounter (Signed)
FYI

## 2020-12-13 NOTE — Telephone Encounter (Signed)
Ok, thanks.

## 2020-12-14 LAB — FUNGUS CULTURE WITH STAIN

## 2020-12-14 LAB — FUNGUS CULTURE RESULT

## 2020-12-14 LAB — FUNGAL ORGANISM REFLEX

## 2020-12-18 ENCOUNTER — Encounter: Payer: Self-pay | Admitting: Orthopaedic Surgery

## 2020-12-18 ENCOUNTER — Ambulatory Visit (INDEPENDENT_AMBULATORY_CARE_PROVIDER_SITE_OTHER): Payer: Medicare Other | Admitting: Orthopaedic Surgery

## 2020-12-18 ENCOUNTER — Ambulatory Visit (INDEPENDENT_AMBULATORY_CARE_PROVIDER_SITE_OTHER): Payer: Medicare Other

## 2020-12-18 DIAGNOSIS — T8149XA Infection following a procedure, other surgical site, initial encounter: Secondary | ICD-10-CM | POA: Diagnosis not present

## 2020-12-18 NOTE — Progress Notes (Signed)
Post-Op Visit Note   Patient: Sean Gardner           Date of Birth: [birth year]           MRN: [id number] Visit Date: 12/18/2020 PCP: [name], MD   Assessment & Plan:  Chief Complaint:  Chief Complaint  Patient presents with  . Right Hip - Pain, Routine Post Op   Visit Diagnoses:  1. Postoperative wound infection of right hip     Plan:   [name] returns today for wound check.  Overall doing well.  No complaints.  Incisional VAC removed today.  Incision is intact.  No signs of breakdown or dehiscence or infection.  Betadine paint was applied and a new Aquacel dressing was placed.  We will keep the sutures in for another week.  Follow-up next week for suture removal.  He will call the infectious disease clinic today to find out when his follow-up should be.  Follow-Up Instructions: Return in about 1 week (around 12/25/2020).   Orders:  Orders Placed This Encounter  Procedures  . XR HIP UNILAT W OR W/O PELVIS 2-3 VIEWS RIGHT   No orders of the defined types were placed in this encounter.   Imaging: No results found.  PMFS History: Patient Active Problem List   Diagnosis Date Noted  . Staphylococcus aureus infection   . Status post total hip replacement, right 11/13/2020  . Postoperative wound infection of right hip 11/13/2020  . Status post total replacement of right hip 09/16/2020  . Hyperkalemia 01/10/2020  . Bone metastasis ([place]) 09/05/2018  . Sepsis secondary to UTI ([place]) 05/11/2016  . CAD (coronary artery disease) 05/11/2016  . Hyperlipidemia 05/11/2016  . Morbid (severe) obesity due to excess calories ([place]) 03/21/2016  . Sleep apnea 09/18/2015  . Dyspnea and respiratory abnormality 09/18/2015  . Dyspnea 10/11/2012  . Essential hypertension 10/11/2012  . Lung cancer ([place]) 03/23/2012   Past Medical History:  Diagnosis Date  . Claudication in peripheral vascular disease ([place])   . Hyperlipidemia   . Hypertension   . Lung cancer ([place])     neuroendocrine lung ca dx 2010  . Myocardial infarction ([place]) 07/2000  . Peripheral vascular disease ([place])   . Shortness of breath    on exertion,can't lay on his back  . Sleep apnea    cpap    Family History  Problem Relation Age of Onset  . Hypertension Mother   . Lung cancer Father     Past Surgical History:  Procedure Laterality Date  . ACHILLES TENDON SURGERY    . ANTERIOR HIP REVISION Right 11/13/2020   Procedure: IRRIGATION AND DEBRIDEMENT RIGHT HIP;  Surgeon: Leandrew Koyanagi, MD;  Location: [facility];  Service: Orthopedics;  Laterality: Right;  . CORONARY ARTERY BYPASS GRAFT  08/18/2000   LIMA--dLIMA, RIMA-dRCA, left RA-OM2, SVG-D2  . FLEXIBLE SIGMOIDOSCOPY N/A 01/18/2013   Procedure: FLEXIBLE SIGMOIDOSCOPY;  Surgeon: Arta Silence, MD;  [facility];  Service: Endoscopy;  Laterality: N/A;  . HERNIA REPAIR     inguinal  . HOT HEMOSTASIS N/A 01/18/2013   Procedure: HOT HEMOSTASIS (ARGON PLASMA COAGULATION/BICAP);  Surgeon: Arta Silence, MD;  Location[facility];  Service: Endoscopy;  Laterality: N/A;  . INCISION AND DRAINAGE HIP Right 12/05/2020   Procedure: IRRIGATION AND DEBRIDEMENT RIGHT HIP;  Surgeon: Leandrew Koyanagi, MD;  Location: [facility];  Service: Orthopedics;  Laterality: Right;  . LUNG SURGERY Left 01/29/2009   left lower lobectomy   . TOTAL HIP ARTHROPLASTY Right  09/16/2020   Procedure: RIGHT TOTAL HIP ARTHROPLASTY ANTERIOR APPROACH;  Surgeon: Leandrew Koyanagi, MD;  Location: [facility];  Service: Orthopedics;  Laterality: Right;   Social History   Occupational History  . Not on file  Tobacco Use  . Smoking status: Former Smoker    Packs/day: 2.00    Years: 20.00    Pack years: 40.00    Types: Cigarettes    Quit date: 10/19/2008    Years since quitting: 12.1  . Smokeless tobacco: Never Used  Vaping Use  . Vaping Use: Never used  Substance and Sexual Activity  . Alcohol use: Yes    Comment: occ  . Drug use: No  . Sexual activity: Not on file

## 2020-12-20 ENCOUNTER — Telehealth: Payer: Self-pay

## 2020-12-20 NOTE — Telephone Encounter (Signed)
Patient called upset that no one told him that his IV Abx are extended through March. He has appt 01/13/21. I explained this was noted as being discussed during the last video call visit but he states he only found out that it will not be removed on 12/24/20 when [place] told him today.Patient is concerned his elderly mother may pass away and wants to be able to travel.Does the pharmacy know that these IV Abx were extended?Please advise.

## 2020-12-20 NOTE — Telephone Encounter (Signed)
Can you please place order for Port removal? I did leave VM with IR Caryl Pina that patient needs this for 01/09/21.

## 2020-12-20 NOTE — Telephone Encounter (Signed)
I spoke to patient. Clarification to home health is to do finish march 24th. Please see if can get your port removed the 24th.

## 2020-12-23 NOTE — Telephone Encounter (Signed)
Spoke with [facility] RN, [name] to confirm patient line. Reports he has a single lumen, nontunneled PICC that is able to be removed in the home. She requested orders to pull PICC after completion of antibiotics on 3/24. Forwarding to provider for okay to give orders.   Madelline Eshbach Lorita Officer, RN

## 2020-12-23 NOTE — Telephone Encounter (Signed)
[facility] notified of abx extension through 3/24; verbal orders given to Tim. Also states patient has single lumen PICC provider note says port. Will call home health team with [facility] to clarify line type as IR will need orders for specific line.

## 2020-12-24 NOTE — Telephone Encounter (Signed)
Per Dr. Baxter Flattery, ok to pull PICC after completion of abx on 3/24. Verbal orders given to [facility] with [facility] and home health RN, [name] [phone]).   Doshia Dalia Lorita Officer, RN

## 2020-12-24 NOTE — Telephone Encounter (Signed)
Yes please arrange to pull on 3/24. Thank you

## 2020-12-25 ENCOUNTER — Other Ambulatory Visit: Payer: Self-pay

## 2020-12-25 ENCOUNTER — Encounter: Payer: Self-pay | Admitting: Orthopaedic Surgery

## 2020-12-25 ENCOUNTER — Ambulatory Visit (INDEPENDENT_AMBULATORY_CARE_PROVIDER_SITE_OTHER): Payer: Medicare Other | Admitting: Physician Assistant

## 2020-12-25 DIAGNOSIS — T8149XA Infection following a procedure, other surgical site, initial encounter: Secondary | ICD-10-CM

## 2020-12-25 NOTE — Progress Notes (Signed)
Post-Op Visit Note   Patient: Sean Gardner           Date of Birth: September 12, 1956           MRN: [id number] Visit Date: 12/25/2020 PCP: [name], MD   Assessment & Plan:  Chief Complaint:  Chief Complaint  Patient presents with  . Right Hip - Pain   Visit Diagnoses:  1. Postoperative wound infection of right hip     Plan: Edi comes in almost 3 weeks out right hip irrigation debridement.  He has been doing well.  No fevers or chills.  No pain to the hip with movement.  He is still on IV antibiotics and is scheduled to have his PICC line removed on 01/09/2021.  Examination of his right hip shows a well-healing surgical incision with nylon sutures in place.  No evidence of infection or cellulitis.  Calves are soft nontender.  Today, sutures were removed.  He will continue to advance with activity as tolerated.  Follow-up with [name] in 3 weeks time for recheck.  Call with concerns or questions in the meantime.  Follow-Up Instructions: Return in about 3 weeks (around 01/15/2021).   Orders:  No orders of the defined types were placed in this encounter.  No orders of the defined types were placed in this encounter.   Imaging: No new imaging  PMFS History: Patient Active Problem List   Diagnosis Date Noted  . Staphylococcus aureus infection   . Status post total hip replacement, right 11/13/2020  . Postoperative wound infection of right hip 11/13/2020  . Status post total replacement of right hip 09/16/2020  . Hyperkalemia 01/10/2020  . Bone metastasis ([place]) 09/05/2018  . Sepsis secondary to UTI ([place]) 05/11/2016  . CAD (coronary artery disease) 05/11/2016  . Hyperlipidemia 05/11/2016  . Morbid (severe) obesity due to excess calories ([place]) 03/21/2016  . Sleep apnea 09/18/2015  . Dyspnea and respiratory abnormality 09/18/2015  . Dyspnea 10/11/2012  . Essential hypertension 10/11/2012  . Lung cancer ([place]) 03/23/2012   Past Medical History:  Diagnosis Date  . Claudication  in peripheral vascular disease ([place])   . Hyperlipidemia   . Hypertension   . Lung cancer ([place])    neuroendocrine lung ca dx 2010  . Myocardial infarction ([place]) 07/2000  . Peripheral vascular disease ([place])   . Shortness of breath    on exertion,can't lay on his back  . Sleep apnea    cpap    Family History  Problem Relation Age of Onset  . Hypertension Mother   . Lung cancer Father     Past Surgical History:  Procedure Laterality Date  . ACHILLES TENDON SURGERY    . ANTERIOR HIP REVISION Right 11/13/2020   Procedure: IRRIGATION AND DEBRIDEMENT RIGHT HIP;  Surgeon: Leandrew Koyanagi, MD;  Location: [place];  Service: Orthopedics;  Laterality: Right;  . CORONARY ARTERY BYPASS GRAFT  08/18/2000   LIMA--dLIMA, RIMA-dRCA, left RA-OM2, SVG-D2  . FLEXIBLE SIGMOIDOSCOPY N/A 01/18/2013   Procedure: FLEXIBLE SIGMOIDOSCOPY;  Surgeon: Arta Silence, MD;  [facility];  Service: Endoscopy;  Laterality: N/A;  . HERNIA REPAIR     inguinal  . HOT HEMOSTASIS N/A 01/18/2013   Procedure: HOT HEMOSTASIS (ARGON PLASMA COAGULATION/BICAP);  Surgeon: Arta Silence, MD;  Location: [facility];  Service: Endoscopy;  Laterality: N/A;  . INCISION AND DRAINAGE HIP Right 12/05/2020   Procedure: IRRIGATION AND DEBRIDEMENT RIGHT HIP;  Surgeon: Leandrew Koyanagi, MD;  Location: [facility];  Service: Orthopedics;  Laterality: Right;  . LUNG SURGERY Left 01/29/2009   left lower lobectomy   . TOTAL HIP ARTHROPLASTY Right 09/16/2020   Procedure: RIGHT TOTAL HIP ARTHROPLASTY ANTERIOR APPROACH;  Surgeon: Leandrew Koyanagi, MD;  Location: [facility];  Service: Orthopedics;  Laterality: Right;   Social History   Occupational History  . Not on file  Tobacco Use  . Smoking status: Former Smoker    Packs/day: 2.00    Years: 20.00    Pack years: 40.00    Types: Cigarettes    Quit date: 10/19/2008    Years since quitting: 12.1  . Smokeless tobacco: Never Used  Vaping Use  . Vaping Use: Never used  Substance and Sexual Activity   . Alcohol use: Yes    Comment: occ  . Drug use: No  . Sexual activity: Not on file

## 2021-01-02 ENCOUNTER — Telehealth: Payer: Self-pay

## 2021-01-02 ENCOUNTER — Other Ambulatory Visit: Payer: Self-pay

## 2021-01-02 DIAGNOSIS — Z96641 Presence of right artificial hip joint: Secondary | ICD-10-CM

## 2021-01-02 MED ORDER — RIFAMPIN 300 MG PO CAPS: 300.0000 mg | ORAL_CAPSULE | Freq: Two times a day (BID) | ORAL | 0 refills | Status: DC

## 2021-01-02 NOTE — Telephone Encounter (Signed)
Patient called, states the Sean Gardner says he was supposed to be done with Rifampin and IV antibiotics on 12/25/20 and that they haven't been monitoring him since then. Patient states his labs were last drawn Monday 12/30/20. RN advised patient that we will be in touch with him once we can confirm medication regimen with Dr. Baxter Flattery. Patient verbalized understanding and has no further questions.   Sean Flock, RN

## 2021-01-02 NOTE — Telephone Encounter (Signed)
Patient called because he is out of refills on his Rifampin. Patient is receiving dapto IV with PICC to be removed 3/24. Notified provider to see if additional refills are needed or if he is to remain off of them until appointment on 3/28.  Maggy Wyble Lorita Officer, RN

## 2021-01-02 NOTE — Telephone Encounter (Addendum)
Called patient to let him know of new prescription for rifampin being sent to his pharmacy. Patient was confused and expressed frustration because [facility] was stating [facility] weren't going to provide further refills because they hadn't been monitoring his blood work. Informed patient that our office was the one monitoring the labs, and scheduling the home health team as well as his antibiotics. Patient provided number to the [facility] to try and clear up any confusion as our office is managing his infection.   Verbal orders given to [facility] at [facility] to draw ESR and CRP prior to PICC removal on the 24th. Patient notified of appointment on 3/28 at 10am. Will follow up with patient after [facility] returns call.   Sean Gardner Lorita Officer, RN  [name], [place](?) [phone] ext: [phone]  UPDATE 01/02/21 [name], [place] returned call. Per provider, patient was also being followed by the [name] and sent OPAT orders prior to patient discharge. They were managing his care until 3/9 when his abx completed. Patient was seen by one of our providers virtually on 2/23; patient was last seen by [name] on 2/24. Per [name] was managing his care since they received the OPAT orders and was being followed by an ID doctor at their facility. She stated they will not be managing abx moving forward as they have him discharged from their care on 3/9. Patient notified of refills of Rifampin and home health was updated with orders for lab work. No further questions or concerns at this time.   Sean Gardner Lorita Officer, RN

## 2021-01-03 NOTE — Telephone Encounter (Signed)
But he has been still on iv abtx, is that correct?

## 2021-01-13 ENCOUNTER — Ambulatory Visit: Payer: Medicare Other | Admitting: Internal Medicine

## 2021-01-15 ENCOUNTER — Encounter: Payer: Self-pay | Admitting: Orthopaedic Surgery

## 2021-01-15 ENCOUNTER — Ambulatory Visit (INDEPENDENT_AMBULATORY_CARE_PROVIDER_SITE_OTHER): Payer: Medicare Other | Admitting: Orthopaedic Surgery

## 2021-01-15 ENCOUNTER — Other Ambulatory Visit: Payer: Self-pay

## 2021-01-15 DIAGNOSIS — A4901 Methicillin susceptible Staphylococcus aureus infection, unspecified site: Secondary | ICD-10-CM

## 2021-01-15 DIAGNOSIS — T8149XA Infection following a procedure, other surgical site, initial encounter: Secondary | ICD-10-CM

## 2021-01-15 NOTE — Progress Notes (Signed)
Post-Op Visit Note   Patient: Sean Gardner           Date of Birth: 22-Oct-1955           MRN: [id number] Visit Date: 01/15/2021 PCP: [name], MD   Assessment & Plan:  Chief Complaint:  Chief Complaint  Patient presents with  . Right Hip - Pain   Visit Diagnoses:  1. Postoperative wound infection of right hip   2. Staphylococcus aureus infection     Plan:   [place] is 6 weeks status post repeat irrigation debridement of a postsurgical right hip wound.  He has been doing well overall.  He feels some tightness since start of stiffness but has no complaints of pain.  He has completed his IV antibiotics course and has a little bit more oral antibiotics left.  Per patient report his blood work and inflammatory markers have been normal.  Right hip surgical scar is healed.  There is no evidence of wound compromise or any infection.  He is ambulating with a slight limp but no pain.  He has lost a significant amount of weight intentionally.  From my standpoint he is recovering well from surgery.  I do need to check him again in about 6 weeks with standing AP pelvis x-rays.  He has an upcoming appointment with Dr. Graylon Good tomorrow to discuss antibiotic duration.  Follow-Up Instructions: Return in about 6 weeks (around 02/26/2021).   Orders:  No orders of the defined types were placed in this encounter.  No orders of the defined types were placed in this encounter.   Imaging: No results found.  PMFS History: Patient Active Problem List   Diagnosis Date Noted  . Staphylococcus aureus infection   . Status post total hip replacement, right 11/13/2020  . Postoperative wound infection of right hip 11/13/2020  . Status post total replacement of right hip 09/16/2020  . Hyperkalemia 01/10/2020  . Bone metastasis ([place]) 09/05/2018  . Sepsis secondary to UTI ([place]) 05/11/2016  . CAD (coronary artery disease) 05/11/2016  . Hyperlipidemia 05/11/2016  . Morbid (severe) obesity due to  excess calories ([place]) 03/21/2016  . Sleep apnea 09/18/2015  . Dyspnea and respiratory abnormality 09/18/2015  . Dyspnea 10/11/2012  . Essential hypertension 10/11/2012  . Lung cancer ([place]) 03/23/2012   Past Medical History:  Diagnosis Date  . Claudication in peripheral vascular disease ([place])   . Hyperlipidemia   . Hypertension   . Lung cancer ([place])    neuroendocrine lung ca dx 2010  . Myocardial infarction ([place]) 07/2000  . Peripheral vascular disease ([place])   . Shortness of breath    on exertion,can't lay on his back  . Sleep apnea    cpap    Family History  Problem Relation Age of Onset  . Hypertension Mother   . Lung cancer Father     Past Surgical History:  Procedure Laterality Date  . ACHILLES TENDON SURGERY    . ANTERIOR HIP REVISION Right 11/13/2020   Procedure: IRRIGATION AND DEBRIDEMENT RIGHT HIP;  Surgeon: Leandrew Koyanagi, MD;  Location: [facility];  Service: Orthopedics;  Laterality: Right;  . CORONARY ARTERY BYPASS GRAFT  08/18/2000   LIMA--dLIMA, RIMA-dRCA, left RA-OM2, SVG-D2  . FLEXIBLE SIGMOIDOSCOPY N/A 01/18/2013   Procedure: FLEXIBLE SIGMOIDOSCOPY;  Surgeon: Arta Silence, MD;  [facility];  Service: Endoscopy;  Laterality: N/A;  . HERNIA REPAIR     inguinal  . HOT HEMOSTASIS N/A 01/18/2013   Procedure: HOT HEMOSTASIS (ARGON PLASMA COAGULATION/BICAP);  Surgeon: Arta Silence, MD;  Location[facility];  Service: Endoscopy;  Laterality: N/A;  . INCISION AND DRAINAGE HIP Right 12/05/2020   Procedure: IRRIGATION AND DEBRIDEMENT RIGHT HIP;  Surgeon: Leandrew Koyanagi, MD;  Location: [facility];  Service: Orthopedics;  Laterality: Right;  . LUNG SURGERY Left 01/29/2009   left lower lobectomy   . TOTAL HIP ARTHROPLASTY Right 09/16/2020   Procedure: RIGHT TOTAL HIP ARTHROPLASTY ANTERIOR APPROACH;  Surgeon: Leandrew Koyanagi, MD;  Location: [facility];  Service: Orthopedics;  Laterality: Right;   Social History   Occupational History  . Not on file  Tobacco Use  . Smoking  status: Former Smoker    Packs/day: 2.00    Years: 20.00    Pack years: 40.00    Types: Cigarettes    Quit date: 10/19/2008    Years since quitting: 12.2  . Smokeless tobacco: Never Used  Vaping Use  . Vaping Use: Never used  Substance and Sexual Activity  . Alcohol use: Yes    Comment: occ  . Drug use: No  . Sexual activity: Not on file

## 2021-01-16 ENCOUNTER — Telehealth: Payer: Self-pay | Admitting: *Deleted

## 2021-01-16 NOTE — Telephone Encounter (Signed)
90 day Ortho bundle call completed; slightly delayed due to post-op infection and treatment after hip replacement. Completed [place], Brooke Bonito. As well.

## 2021-01-22 ENCOUNTER — Other Ambulatory Visit: Payer: Self-pay | Admitting: Cardiology

## 2021-01-22 DIAGNOSIS — I2510 Atherosclerotic heart disease of native coronary artery without angina pectoris: Secondary | ICD-10-CM

## 2021-01-22 DIAGNOSIS — Z951 Presence of aortocoronary bypass graft: Secondary | ICD-10-CM

## 2021-01-23 ENCOUNTER — Other Ambulatory Visit: Payer: Self-pay

## 2021-01-23 ENCOUNTER — Encounter: Payer: Self-pay | Admitting: Internal Medicine

## 2021-01-23 ENCOUNTER — Ambulatory Visit (INDEPENDENT_AMBULATORY_CARE_PROVIDER_SITE_OTHER): Payer: Medicare Other | Admitting: Internal Medicine

## 2021-01-23 VITALS — BP 127/75 | HR 56 | Wt 269.0 lb

## 2021-01-23 DIAGNOSIS — T8450XD Infection and inflammatory reaction due to unspecified internal joint prosthesis, subsequent encounter: Secondary | ICD-10-CM

## 2021-01-23 DIAGNOSIS — A4902 Methicillin resistant Staphylococcus aureus infection, unspecified site: Secondary | ICD-10-CM

## 2021-01-23 DIAGNOSIS — Z8619 Personal history of other infectious and parasitic diseases: Secondary | ICD-10-CM

## 2021-01-23 NOTE — Progress Notes (Signed)
Patient ID: Sean Gardner, male   DOB: March 24, 1956, 65 y.o.   MRN: [id number]  HPI Sean Gardner is a 65yo M with history of right prosthetic hip MRSA infection noted in December and late January. Complicated by wound dehiscence requiring wash out and wound vac. Treated with daptomycin and rifampin. He finished his IV abtx on 3/25.   No pain with doing usually goes bowl/golf   No candidal skin infection nor diarrhea   Headed to [place]    Outpatient Encounter Medications as of 01/23/2021  Medication Sig  . amLODipine (NORVASC) 5 MG tablet Take 1 tablet (5 mg total) by mouth daily. (Patient taking differently: Take 5 mg by mouth in the morning and at bedtime.)  . aspirin EC 81 MG tablet Take 81 mg by mouth daily. Swallow whole.  . clopidogrel (PLAVIX) 75 MG tablet Take 1 tablet (75 mg total) by mouth daily before breakfast.  . diphenhydramine-acetaminophen (TYLENOL PM) 25-500 MG TABS tablet Take 2 tablets by mouth at bedtime.  . docusate sodium (COLACE) 100 MG capsule Take 1 capsule (100 mg total) by mouth daily as needed.  . Evolocumab (REPATHA SURECLICK) 893 MG/ML SOAJ Inject 140 mg into the skin every 14 (fourteen) days.  Marland Kitchen ezetimibe (ZETIA) 10 MG tablet TAKE 1 TABLET BY MOUTH EVERY DAY  . hydrALAZINE (APRESOLINE) 25 MG tablet Take 1 tablet (25 mg total) by mouth 3 (three) times daily.  Marland Kitchen HYDROcodone-acetaminophen (NORCO) 5-325 MG tablet Take 1-2 tablets by mouth 3 (three) times daily as needed.  . metoprolol (LOPRESSOR) 50 MG tablet Take 50 mg by mouth 2 (two) times daily. Hold if systolic blood pressure (top blood pressure number) less than 100 mmHg or heart rate less than 60 bpm (pulse).  Marland Kitchen omeprazole (PRILOSEC) 20 MG capsule Take 20 mg by mouth daily.  . ondansetron (ZOFRAN) 4 MG tablet Take 1 tablet (4 mg total) by mouth every 8 (eight) hours as needed for nausea or vomiting.  . rifampin (RIFADIN) 300 MG capsule Take 1 capsule (300 mg total) by mouth 2 (two) times daily.  (Patient not taking: Reported on 01/23/2021)  . traZODone (DESYREL) 50 MG tablet Take 1 tablet (50 mg total) by mouth at bedtime.  . triamcinolone ointment (KENALOG) 0.1 % Apply 1 application topically 2 (two) times daily.  . Vitamin D, Ergocalciferol, (DRISDOL) 50000 UNITS CAPS Take 50,000 Units by mouth every Saturday.    No facility-administered encounter medications on file as of 01/23/2021.     Patient Active Problem List   Diagnosis Date Noted  . Staphylococcus aureus infection   . Status post total hip replacement, right 11/13/2020  . Postoperative wound infection of right hip 11/13/2020  . Status post total replacement of right hip 09/16/2020  . Hyperkalemia 01/10/2020  . Bone metastasis ([place]) 09/05/2018  . Sepsis secondary to UTI ([place]) 05/11/2016  . CAD (coronary artery disease) 05/11/2016  . Hyperlipidemia 05/11/2016  . Morbid (severe) obesity due to excess calories ([place]) 03/21/2016  . Sleep apnea 09/18/2015  . Dyspnea and respiratory abnormality 09/18/2015  . Dyspnea 10/11/2012  . Essential hypertension 10/11/2012  . Lung cancer ([place]) 03/23/2012     Health Maintenance Due  Topic Date Due  . Hepatitis C Screening  Never done  . COLONOSCOPY (Pts 45-60yrs Insurance coverage will need to be confirmed)  Never done  . PNA vac Low Risk Adult (1 of 2 - PCV13) 01/23/2019  . COVID-19 Vaccine (3 - Pfizer risk 4-dose series) 12/27/2019  Review of Systems 12 point ros is negative Physical Exam   BP 127/75   Pulse (!) 56   Wt 269 lb (122 kg)   BMI 36.48 kg/m   gen = a xo by 3 in nad Skin = right hip well healed incision/minimal keloid scarring CBC Lab Results  Component Value Date   WBC 5.0 12/06/2020   RBC 3.38 (L) 12/06/2020   HGB 10.1 (L) 12/06/2020   HCT 33.2 (L) 12/06/2020   PLT 262 12/06/2020   MCV 98.2 12/06/2020   MCH 29.9 12/06/2020   MCHC 30.4 12/06/2020   RDW 16.3 (H) 12/06/2020   LYMPHSABS 1,223 11/13/2020   MONOABS 0.6 09/05/2020   EOSABS 143  11/13/2020    BMET Lab Results  Component Value Date   NA 137 12/06/2020   K 4.4 12/06/2020   CL 107 12/06/2020   CO2 22 12/06/2020   GLUCOSE 101 (H) 12/06/2020   BUN 10 12/06/2020   CREATININE 0.92 12/06/2020   CALCIUM 8.8 (L) 12/06/2020   GFRNONAA >60 12/06/2020   GFRAA 95 01/09/2020    Assessment and Plan   MRSA right prosthetic hip infection = completed course of therapy. No further abtx required at this time since completed 3 months of treatment

## 2021-02-19 ENCOUNTER — Ambulatory Visit: Payer: Medicare Other | Admitting: Cardiology

## 2021-02-26 ENCOUNTER — Ambulatory Visit: Payer: Medicare Other | Admitting: Orthopaedic Surgery

## 2021-04-03 ENCOUNTER — Ambulatory Visit: Payer: Medicare Other | Admitting: Cardiology

## 2021-04-03 ENCOUNTER — Other Ambulatory Visit: Payer: Self-pay

## 2021-04-03 ENCOUNTER — Encounter: Payer: Self-pay | Admitting: Cardiology

## 2021-04-03 VITALS — BP 129/73 | HR 60 | Temp 97.7°F | Resp 16 | Ht 72.0 in | Wt 273.6 lb

## 2021-04-03 DIAGNOSIS — I10 Essential (primary) hypertension: Secondary | ICD-10-CM

## 2021-04-03 DIAGNOSIS — I739 Peripheral vascular disease, unspecified: Secondary | ICD-10-CM

## 2021-04-03 DIAGNOSIS — E782 Mixed hyperlipidemia: Secondary | ICD-10-CM

## 2021-04-03 DIAGNOSIS — Z951 Presence of aortocoronary bypass graft: Secondary | ICD-10-CM

## 2021-04-03 DIAGNOSIS — I2510 Atherosclerotic heart disease of native coronary artery without angina pectoris: Secondary | ICD-10-CM

## 2021-04-03 MED ORDER — AMLODIPINE BESYLATE 10 MG PO TABS: 10.0000 mg | ORAL_TABLET | Freq: Every day | ORAL | 3 refills | Status: DC

## 2021-04-03 NOTE — Progress Notes (Signed)
Primary Physician/Referring:  [name], MD  Patient ID: [name], male    DOB: 12-19-1955, 65 y.o.   MRN: [id number]  Chief Complaint  Patient presents with   Coronary Artery Disease   PAD   Follow-up    6 months   Hyperlipidemia   HPI:    LE FAULCON is a 65 y.o. MURRELL DOME is a 65 y.o. male who presents to the office for follow-up of coronary disease and hypertension and peripheral artery disease.  Past medical history significant for coronary disease with prior CABG in 2001, obesity, OSA on CPAP, mixed hyperlipidemia, hypertension, former smoker, history of lung cancer status post chemotherapy and left lower lobectomy. He has PAD and symptoms of claudication involving his right calf worse than the left calf. He also has DJD bilateral hips.   Underwent right hip arthroplasty on 09/16/2020 but unfortunately ended up having multiple admissions to the hospital due to sepsis and abscess fortunately did not involve the joint.  He has now recuperated well.  Symptoms of claudication are remained stable. He denies worsening dyspnea, recent angina, leg edema, PND or orthopnea.  Past Medical History:  Diagnosis Date   Claudication in peripheral vascular disease ([place])    Hyperlipidemia    Hypertension    Lung cancer ([place])    neuroendocrine lung ca dx 2010   Myocardial infarction [facility]) 07/2000   Peripheral vascular disease (H[facility])    Shortness of breath    on exertion,can't lay on his back   Sleep apnea    cpap   Past Surgical History:  Procedure Laterality Date   ACHILLES TENDON SURGERY     ANTERIOR HIP REVISION Right 11/13/2020   Procedure: IRRIGATION AND DEBRIDEMENT RIGHT HIP;  Surgeon: Leandrew Koyanagi, MD;  Location: [facility] [facility];  Service: Orthopedics;  Laterality: Right;   CORONARY ARTERY BYPASS GRAFT  08/18/2000   LIMA--dLIMA, RIMA-dRCA, left RA-OM2, SVG-D2   FLEXIBLE SIGMOIDOSCOPY N/A 01/18/2013   Procedure: FLEXIBLE SIGMOIDOSCOPY;  Surgeon: Arta Silence, MD;  Location:  [facility];  Service: Endoscopy;  Laterality: N/A;   HERNIA REPAIR     inguinal   HOT HEMOSTASIS N/A 01/18/2013   Procedure: HOT HEMOSTASIS (ARGON PLASMA COAGULATION/BICAP);  Surgeon: Arta Silence, MD;  Location: [facility];  Service: Endoscopy;  Laterality: N/A;   INCISION AND DRAINAGE HIP Right 12/05/2020   Procedure: IRRIGATION AND DEBRIDEMENT RIGHT HIP;  Surgeon: Leandrew Koyanagi, MD;  Location: [facility];  Service: Orthopedics;  Laterality: Right;   LUNG SURGERY Left 01/29/2009   left lower lobectomy    TOTAL HIP ARTHROPLASTY Right 09/16/2020   Procedure: RIGHT TOTAL HIP ARTHROPLASTY ANTERIOR APPROACH;  Surgeon: Leandrew Koyanagi, MD;  Location: [facility];  Service: Orthopedics;  Laterality: Right;   Family History  Problem Relation Age of Onset   Hypertension Mother    Lung cancer Father     Social History   Tobacco Use   Smoking status: Former    Packs/day: 2.00    Years: 20.00    Pack years: 40.00    Types: Cigarettes    Quit date: 10/19/2008    Years since quitting: 12.4   Smokeless tobacco: Never  Substance Use Topics   Alcohol use: Yes    Comment: occ   Marital Status: Married  ROS  Review of Systems  Cardiovascular:  Positive for claudication. Negative for chest pain, dyspnea on exertion and leg swelling.  Musculoskeletal:  Positive for arthritis (right ankle), back pain and joint pain (bilateral  hips).  Gastrointestinal:  Negative for melena.  Objective  Blood pressure 129/73, pulse 60, temperature 97.7 F (36.5 C), temperature source Temporal, resp. rate 16, height 6' (1.829 m), weight 273 lb 9.6 oz (124.1 kg), SpO2 95 %.  Vitals with BMI 04/03/2021 01/23/2021 12/12/2020  Height '6\' 0"'  - '6\' 0"'   Weight 273 lbs 10 oz 269 lbs 260 lbs  BMI 93.7 - 16.96  Systolic 789 381 -  Diastolic 73 75 -  Pulse 60 56 -     Physical Exam Vitals reviewed.  Constitutional:      Appearance: He is well-developed.     Comments: Morbidly obese  Cardiovascular:     Rate and Rhythm: Normal  rate and regular rhythm.     Pulses: Intact distal pulses.          Femoral pulses are 2+ on the right side and 2+ on the left side.      Popliteal pulses are 0 on the right side and 0 on the left side.       Dorsalis pedis pulses are 0 on the right side and 0 on the left side.       Posterior tibial pulses are 0 on the right side and 2+ on the left side.     Heart sounds: Normal heart sounds. No murmur heard.   No gallop.     Comments: No JVD. No leg edema. Pulmonary:     Effort: Pulmonary effort is normal. No accessory muscle usage or respiratory distress.     Breath sounds: Normal breath sounds.  Abdominal:     General: Bowel sounds are normal.     Palpations: Abdomen is soft.     Hernia: A hernia is present. Hernia is present in the ventral area (reducible).   Laboratory examination:   Recent Labs    11/13/20 1307 11/14/20 0542 12/06/20 0108  NA 139 137 137  K 4.8 5.0 4.4  CL 105 106 107  CO2 '26 25 22  ' GLUCOSE 97 133* 101*  BUN '14 15 10  ' CREATININE 1.04 1.11 0.92  CALCIUM 9.7 9.2 8.8*  GFRNONAA >60 >60 >60   CrCl cannot be calculated (Patient's most recent lab result is older than the maximum 21 days allowed.).  CMP Latest Ref Rng & Units 12/06/2020 11/14/2020 11/13/2020  Glucose 70 - 99 mg/dL 101(H) 133(H) 97  BUN 8 - 23 mg/dL '10 15 14  ' Creatinine 0.61 - 1.24 mg/dL 0.92 1.11 1.04  Sodium 135 - 145 mmol/L 137 137 139  Potassium 3.5 - 5.1 mmol/L 4.4 5.0 4.8  Chloride 98 - 111 mmol/L 107 106 105  CO2 22 - 32 mmol/L '22 25 26  ' Calcium 8.9 - 10.3 mg/dL 8.8(L) 9.2 9.7  Total Protein 6.5 - 8.1 g/dL - - -  Total Bilirubin 0.3 - 1.2 mg/dL - - -  Alkaline Phos 38 - 126 U/L - - -  AST 15 - 41 U/L - - -  ALT 0 - 44 U/L - - -   CBC Latest Ref Rng & Units 12/06/2020 11/14/2020 11/13/2020  WBC 4.0 - 10.5 K/uL 5.0 7.6 7.5  Hemoglobin 13.0 - 17.0 g/dL 10.1(L) 10.5(L) 13.9  Hematocrit 39.0 - 52.0 % 33.2(L) 33.9(L) 42.5  Platelets 150 - 400 K/uL 262 300 402(H)   Lipid  Panel Recent Labs    04/03/21 0944  CHOL 114  TRIG 143  LDLCALC 54  HDL 35*    TSH No results for input(s): TSH in the last 8760  hours.  External labs:   Labs 12/24/2020:   Total cholesterol 218, triglycerides 151, HDL 38, LDL 140.  HB 11.7/HCT 37.6, platelets 319.  Serum glucose 89 mg, sodium 139, potassium 4.7, BUN 14, creatinine 0.93.  CMP normal.  Hemoglobin 15.800 G/ 01/09/2020 Creatinine, Serum 0.900 mg/ 02/09/2020 Potassium 5.100 mm 02/09/2020  02/09/2020: Serum glucose 8900 g, BUN 15, creatinine 0.90, EGFR >80 mL.  Potassium 5.1.  Medications and allergies   Allergies  Allergen Reactions   Lisinopril Other (See Comments)    Hyperkalemia   Zolpidem Tartrate Other (See Comments)    Felt fuzzy and hallucinations    Current Outpatient Medications on File Prior to Visit  Medication Sig Dispense Refill   aspirin EC 81 MG tablet Take 81 mg by mouth daily. Swallow whole.     clopidogrel (PLAVIX) 75 MG tablet Take 1 tablet (75 mg total) by mouth daily before breakfast. 1 tablet 0   diphenhydramine-acetaminophen (TYLENOL PM) 25-500 MG TABS tablet Take 2 tablets by mouth at bedtime.     ezetimibe (ZETIA) 10 MG tablet TAKE 1 TABLET BY MOUTH EVERY DAY 90 tablet 3   hydrALAZINE (APRESOLINE) 25 MG tablet Take 1 tablet (25 mg total) by mouth 3 (three) times daily. 90 tablet 3   metoprolol (LOPRESSOR) 50 MG tablet Take 50 mg by mouth 2 (two) times daily. Hold if systolic blood pressure (top blood pressure number) less than 100 mmHg or heart rate less than 60 bpm (pulse).     omeprazole (PRILOSEC) 20 MG capsule Take 20 mg by mouth daily.     Polyethylene Glycol 400 0.25 % GEL Apply 1-2 drops to eye daily.     rosuvastatin (CRESTOR) 40 MG tablet Take 1 tablet by mouth at bedtime.     Vitamin D, Ergocalciferol, (DRISDOL) 50000 UNITS CAPS Take 50,000 Units by mouth every Saturday.      No current facility-administered medications on file prior to visit.    Medications after  present encounter:  Current Outpatient Medications  Medication Instructions   amLODipine (NORVASC) 10 mg, Oral, Daily   aspirin EC 81 mg, Oral, Daily, Swallow whole.   clopidogrel (PLAVIX) 75 mg, Oral, Daily before breakfast   diphenhydramine-acetaminophen (TYLENOL PM) 25-500 MG TABS tablet 2 tablets, Oral, Daily at bedtime   ezetimibe (ZETIA) 10 MG tablet TAKE 1 TABLET BY MOUTH EVERY DAY   hydrALAZINE (APRESOLINE) 25 mg, Oral, 3 times daily   metoprolol tartrate (LOPRESSOR) 50 mg, Oral, 2 times daily, Hold if systolic blood pressure (top blood pressure number) less than 100 mmHg or heart rate less than 60 bpm (pulse).   omeprazole (PRILOSEC) 20 mg, Oral, Daily   Polyethylene Glycol 400 0.25 % GEL 1-2 drops, Ophthalmic, Daily   rosuvastatin (CRESTOR) 40 MG tablet 1 tablet, Oral, Daily at bedtime   Vitamin D (Ergocalciferol) (DRISDOL) 50,000 Units, Oral, Every Sat    Radiology:   No results found.  Cardiac Studies:   Coronary bypass grafting surgery in 2011: LIMA to the LAD, SVG to diagonal branch, free radial artery to OM branch.  Echocardiogram 11/04/2017: LVEF 14%, grade 1 diastolic impairment, moderate concentric hypertrophy, normal left atrial pressure, moderately dilated left atrium, right ventricle is mildly dilated with normal function.  Mild to moderate MR.  Stress Testing Lexiscan Myoview 12/20/2017: Gated SPECT imaging demonstrates hypokinesis of the apical anterior myocardial wall(s). The left ventricular ejection fraction was calculated or visually estimated to be 59%. SPECT images demonstrate small, fixed perfusion abnormality of moderate intensity in the apical  anterior myocardial wall(s). SPECT images also demonstrate small, reversible perfusion abnormality of mild intensity in mid inferior, basal inferior myocardium. This suggests mild inferior ischemia, and possible anteroapical infarct. Low to intermediate risk study.  Lower extremity arterial duplex 11/05/2019:  Abnormal  ABIs bilaterally suggestive of peripheral vascular disease.   Moderately abnormal pulse volume recordings at the level of bilateral ankles. Arterial duplex (right) demonstrates multiphasic waveform up to the popliteal artery.  Posterior tibial artery and anterior tibial artery noted to have monophasic waveform. Arterial duplex (left) demonstrates multiphasic waveform except at the level of mid SFA.  Mid SFA demonstrates a monophasic waveform with near occlusion and diffuse plaque. Conclusions: This exam reveals moderately decreased perfusion of the right lower extremity, noted at the post tibial artery level (ABI 0.67) and moderately decreased perfusion of the left lower extremity, noted at the post tibial artery level (ABI 0.73).  EKG  EKG 04/03/2021: Normal sinus rhythm at rate of 54 bpm, left renal enlargement, normal axis.  Right bundle branch block.  LVH with repolarization abnormality, cannot exclude inferior and anterolateral ischemia.  No significant change from 08/22/2020.  Assessment     ICD-10-CM   1. Atherosclerosis of native coronary artery of native heart without angina pectoris  I25.10 EKG 12-Lead    2. S/P CABG x 3  Z95.1     3. Mixed hyperlipidemia  E78.2 Lipid Panel With LDL/HDL Ratio    4. Claudication in peripheral vascular disease (HCC)  I73.9     5. Essential hypertension  I10 amLODipine (NORVASC) 10 MG tablet      Meds ordered this encounter  Medications   amLODipine (NORVASC) 10 MG tablet    Sig: Take 1 tablet (10 mg total) by mouth daily.    Dispense:  90 tablet    Refill:  3     Medications Discontinued During This Encounter  Medication Reason   triamcinolone ointment (KENALOG) 0.1 % Error   traZODone (DESYREL) 50 MG tablet Error   rifampin (RIFADIN) 300 MG capsule Error   ondansetron (ZOFRAN) 4 MG tablet Error   HYDROcodone-acetaminophen (NORCO) 5-325 MG tablet Error   Evolocumab (REPATHA SURECLICK) 280 MG/ML SOAJ Error   docusate sodium (COLACE) 100  MG capsule Error   amLODipine (NORVASC) 5 MG tablet Discontinued by provider    Recommendations:   KRON EVERTON is a 65 y.o. NICKLAS MCSWEENEY is a 65 y.o. male who presents to the office for follow-up of coronary disease and hypertension and peripheral artery disease.  Past medical history significant for coronary disease with prior CABG in 2001, obesity, OSA on CPAP, mixed hyperlipidemia, hypertension, former smoker, history of lung cancer status post chemotherapy and left lower lobectomy. He has PAD and symptoms of claudication involving his right calf worse than the left calf. He also has DJD bilateral hips.   Underwent right hip arthroplasty on 09/16/2020 but unfortunately ended up having multiple admissions to the hospital due to sepsis and abscess fortunately did not involve the joint.  He has now recuperated well.  Symptoms of claudication are remained stable.  No significant change in his physical exam with regard to cardiovascular system.  He is presently on dual antiplatelet therapy for claudication.  On his last office visit we tried to get prior authorization for [place] and also Nexlizet, however he is presently not on this.  He is on max dose of rosuvastatin and Zetia.  He has not had any recurrence of angina pectoris, no dyspnea, no PND or orthopnea.  Will obtain lipid profile testing today.  We will try to do PA for [place] if lipids are not at goal.    His blood pressure is well controlled on present medical regimen, he could not tolerate lisinopril due to hyperkalemia.  I will see him back in 6 months for follow-up.  Addendum: Lipids under excellent control. Continue present management.   Adrian Prows, MD, [facility] 04/04/2021, 6:31 AM Office: [phone] Pager: [phone]

## 2021-04-04 LAB — LIPID PANEL WITH LDL/HDL RATIO
Cholesterol, Total: 114 mg/dL (ref 100–199)
HDL: 35 mg/dL — ABNORMAL LOW (ref >39)
LDL Chol Calc (NIH): 54 mg/dL (ref 0–99)
LDL/HDL Ratio: 1.5 ratio (ref 0.0–3.6)
Triglycerides: 143 mg/dL (ref 0–149)
VLDL Cholesterol Cal: 25 mg/dL (ref 5–40)

## 2021-04-29 ENCOUNTER — Other Ambulatory Visit: Payer: Self-pay | Admitting: Cardiology

## 2021-04-29 DIAGNOSIS — I10 Essential (primary) hypertension: Secondary | ICD-10-CM

## 2021-05-27 ENCOUNTER — Telehealth: Payer: Self-pay

## 2021-05-27 DIAGNOSIS — E782 Mixed hyperlipidemia: Secondary | ICD-10-CM

## 2021-05-27 DIAGNOSIS — Z951 Presence of aortocoronary bypass graft: Secondary | ICD-10-CM

## 2021-05-27 DIAGNOSIS — I2510 Atherosclerotic heart disease of native coronary artery without angina pectoris: Secondary | ICD-10-CM

## 2021-05-27 MED ORDER — EZETIMIBE 10 MG PO TABS: 10.0000 mg | ORAL_TABLET | Freq: Every day | ORAL | 3 refills | Status: AC

## 2021-05-27 NOTE — Telephone Encounter (Signed)
Pt called and stated that he has been taking Ezetimibe 10mg  for the last 3 months and has just ran out. He is also stating that he has no idea if he is still suppose to be taking it and that he has never heard of this medication? However, his chart states he has been taking it since 10/31/2019. There is nothing stating he was ever taken off of this medication. Im not sure if he is confused or not but he wants to be absolutely sure he is suppose to be taking it.

## 2021-05-28 NOTE — Telephone Encounter (Signed)
Attempted to call pt, no answer. Left vm requesting call back.

## 2021-07-16 ENCOUNTER — Other Ambulatory Visit: Payer: Self-pay | Admitting: Cardiology

## 2021-07-16 DIAGNOSIS — I10 Essential (primary) hypertension: Secondary | ICD-10-CM

## 2021-07-22 ENCOUNTER — Telehealth: Payer: Self-pay

## 2021-07-22 NOTE — Telephone Encounter (Signed)
Per [employer] like a rash. Recommend starting off with 1% hydrocortisone cream twice a day.    Patient aware.

## 2021-07-22 NOTE — Telephone Encounter (Signed)
12/05/2020- I & D RIGHT HIP   Patient called this AM. States he has a rash. See pic below. Did you want him to come in?

## 2021-08-20 ENCOUNTER — Other Ambulatory Visit: Payer: Self-pay | Admitting: Cardiology

## 2021-08-20 DIAGNOSIS — I10 Essential (primary) hypertension: Secondary | ICD-10-CM

## 2021-10-02 ENCOUNTER — Ambulatory Visit: Payer: Medicare Other | Admitting: Cardiology

## 2021-10-30 NOTE — Progress Notes (Signed)
Primary Physician/Referring:  [name], MD  Patient ID: Sean Gardner, male    DOB: 19-Jan-1956, 66 y.o.   MRN: [id number]  Chief Complaint  Patient presents with   Coronary Artery Disease        Follow-up   HPI:    Sean Gardner is a 66 y.o. Sean Gardner is a 66 y.o. male who presents to the office for follow-up of coronary disease and hypertension and peripheral artery disease.  Past medical history significant for coronary disease with prior CABG in 2001, obesity, OSA on CPAP, mixed hyperlipidemia, hypertension, former smoker, history of lung cancer status post chemotherapy and left lower lobectomy. He has PAD and symptoms of claudication involving his right calf worse than the left calf. He also has DJD bilateral hips.   Underwent right hip arthroplasty on 09/16/2020 but unfortunately ended up having multiple admissions to the hospital due to sepsis and abscess fortunately did not involve the joint and patient recuperated well.  Patient presents for 34-monthfollow-up.  Patient's primary concern is worsening dyspnea on exertion over the last few months.  Denies chest pain, palpitations, syncope, near syncope, leg swelling, PND.  He sleeps on a wedge at baseline due to underlying sleep apnea.  Notably patient has gained a significant amount of weight over the last several months, 25 pounds since June.  Symptoms of claudication remained stable.  Upon further questioning patient has not been taking Crestor for an unknown period of time.  Lipids were previously well controlled with Crestor and Zetia.  Past Medical History:  Diagnosis Date   Claudication in peripheral vascular disease ([facility]    Hyperlipidemia    Hypertension    [place] [place]    neuroendocrine lung ca dx 2010   Myocardial infarction ([facility] 07/2000   Peripheral vascular disease ([facility])    Shortness of breath    on exertion,can't lay on his back   Sleep apnea    cpap   Past Surgical History:  Procedure  Laterality Date   ACHILLES TENDON SURGERY     ANTERIOR HIP REVISION Right 11/13/2020   Procedure: IRRIGATION AND DEBRIDEMENT RIGHT HIP;  Surgeon: XLeandrew Koyanagi MD;  [place] [facility]  Service: Orthopedics;  Laterality: Right;   CORONARY ARTERY BYPASS GRAFT  08/18/2000   LIMA--dLIMA, RIMA-dRCA, left RA-OM2, SVG-D2   FLEXIBLE SIGMOIDOSCOPY N/A 01/18/2013   Procedure: FLEXIBLE SIGMOIDOSCOPY;  Surgeon: WArta Silence MD;  [facility];  Service: Endoscopy;  Laterality: N/A;   HERNIA REPAIR     inguinal   HOT HEMOSTASIS N/A 01/18/2013   Procedure: HOT HEMOSTASIS (ARGON PLASMA COAGULATION/BICAP);  Surgeon: WArta Silence MD;  [facility];  Service: Endoscopy;  Laterality: N/A;   INCISION AND DRAINAGE HIP Right 12/05/2020   Procedure: IRRIGATION AND DEBRIDEMENT RIGHT HIP;  Surgeon: XLeandrew Koyanagi MD;  [place] [facility]  Service: Orthopedics;  Laterality: Right;   LUNG SURGERY Left 01/29/2009   left lower lobectomy    TOTAL HIP ARTHROPLASTY Right 09/16/2020   Procedure: RIGHT TOTAL HIP ARTHROPLASTY ANTERIOR APPROACH;  Surgeon: XLeandrew Koyanagi MD;  [place] [facility]  Service: [place]opedics;  Laterality: Right;   Family History  Problem Relation Age of Onset   Hypertension Mother    Lung cancer Father     Social History   Tobacco Use   Smoking status: Former    Packs/day: 2.00    Years: 20.00    Pack years: 40.00    Types: Cigarettes    Quit  date: 10/19/2008    Years since quitting: 13.0   Smokeless tobacco: Never  Substance Use Topics   Alcohol use: Yes    Comment: occ   Marital Status: Married  ROS  Review of Systems  Cardiovascular:  Positive for claudication (stable) and dyspnea on exertion. Negative for chest pain, leg swelling, near-syncope, orthopnea, palpitations, paroxysmal nocturnal dyspnea and syncope.  Respiratory:  Negative for shortness of breath.   Neurological:  Negative for dizziness.  Objective  Blood pressure (!) 154/86, pulse 60, resp. rate 16, height  6' (1.829 m), weight 298 lb (135.2 kg), SpO2 95 %.  Vitals with BMI 10/31/2021 04/03/2021 01/23/2021  Height '6\' 0"'  '6\' 0"'  -  Weight 298 lbs 273 lbs 10 oz 269 lbs  BMI 32.44 01.0 -  Systolic 272 536 644  Diastolic 86 73 75  Pulse 60 60 56     Physical Exam Vitals reviewed.  Constitutional:      Appearance: He is well-developed. He is obese.     Comments: Morbidly obese  Cardiovascular:     Rate and Rhythm: Normal rate and regular rhythm.     Pulses: Intact distal pulses.          Femoral pulses are 2+ on the right side and 2+ on the left side.      Popliteal pulses are 0 on the right side and 0 on the left side.       Dorsalis pedis pulses are 0 on the right side and 0 on the left side.       Posterior tibial pulses are 0 on the right side and 2+ on the left side.     Heart sounds: Normal heart sounds. No murmur heard.   No gallop.     Comments: No JVD. Pulmonary:     Effort: Pulmonary effort is normal. No accessory muscle usage or respiratory distress.     Breath sounds: Normal breath sounds.  Abdominal:     Hernia: A hernia is present. Hernia is present in the ventral area (reducible).  Musculoskeletal:     Right lower leg: No edema.     Left lower leg: No edema.   Laboratory examination:   Recent Labs    11/13/20 1307 11/14/20 0542 12/06/20 0108  NA 139 137 137  K 4.8 5.0 4.4  CL 105 106 107  CO2 '26 25 22  ' GLUCOSE 97 133* 101*  BUN '14 15 10  ' CREATININE 1.04 1.11 0.92  CALCIUM 9.7 9.2 8.8*  GFRNONAA >60 >60 >60   CrCl cannot be calculated (Patient's most recent lab result is older than the maximum 21 days allowed.).  CMP Latest Ref Rng & Units 12/06/2020 11/14/2020 11/13/2020  Glucose 70 - 99 mg/dL 101(H) 133(H) 97  BUN 8 - 23 mg/dL '10 15 14  ' Creatinine 0.61 - 1.24 mg/dL 0.92 1.11 1.04  Sodium 135 - 145 mmol/L 137 137 139  Potassium 3.5 - 5.1 mmol/L 4.4 5.0 4.8  Chloride 98 - 111 mmol/L 107 106 105  CO2 22 - 32 mmol/L '22 25 26  ' Calcium 8.9 - 10.3 mg/dL 8.8(L) 9.2  9.7  Total Protein 6.5 - 8.1 g/dL - - -  Total Bilirubin 0.3 - 1.2 mg/dL - - -  Alkaline Phos 38 - 126 U/L - - -  AST 15 - 41 U/L - - -  ALT 0 - 44 U/L - - -   CBC Latest Ref Rng & Units 12/06/2020 11/14/2020 11/13/2020  WBC 4.0 - 10.5 K/uL  5.0 7.6 7.5  Hemoglobin 13.0 - 17.0 g/dL 10.1(L) 10.5(L) 13.9  Hematocrit 39.0 - 52.0 % 33.2(L) 33.9(L) 42.5  Platelets 150 - 400 K/uL 262 300 402(H)   Lipid Panel Recent Labs    04/03/21 0944  CHOL 114  TRIG 143  LDLCALC 54  HDL 35*    TSH No results for input(s): TSH in the last 8760 hours.  External labs:   Labs 12/24/2020:   Total cholesterol 218, triglycerides 151, HDL 38, LDL 140.  HB 11.7/HCT 37.6, platelets 319.  Serum glucose 89 mg, sodium 139, potassium 4.7, BUN 14, creatinine 0.93.  CMP normal.  Hemoglobin 15.800 G/ 01/09/2020 Creatinine, Serum 0.900 mg/ 02/09/2020 Potassium 5.100 mm 02/09/2020  02/09/2020: Serum glucose 8900 g, BUN 15, creatinine 0.90, EGFR >80 mL.  Potassium 5.1. Allergies   Allergies  Allergen Reactions   Lisinopril Other (See Comments)    Hyperkalemia   Zolpidem Tartrate Other (See Comments)    Felt fuzzy and hallucinations      Medications Prior to Visit:   Outpatient Medications Prior to Visit  Medication Sig Dispense Refill   amLODipine (NORVASC) 10 MG tablet Take 1 tablet (10 mg total) by mouth daily. 90 tablet 3   aspirin EC 81 MG tablet Take 81 mg by mouth daily. Swallow whole.     clopidogrel (PLAVIX) 75 MG tablet Take 1 tablet (75 mg total) by mouth daily before breakfast. 1 tablet 0   ezetimibe (ZETIA) 10 MG tablet Take 1 tablet (10 mg total) by mouth daily after supper. 90 tablet 3   metoprolol (LOPRESSOR) 50 MG tablet Take 50 mg by mouth 2 (two) times daily. Hold if systolic blood pressure (top blood pressure number) less than 100 mmHg or heart rate less than 60 bpm (pulse).     omeprazole (PRILOSEC) 20 MG capsule Take 20 mg by mouth daily.     Polyethylene Glycol 400 0.25 % GEL  Apply 1-2 drops to eye daily.     Vitamin D, Ergocalciferol, (DRISDOL) 50000 UNITS CAPS Take 50,000 Units by mouth every Saturday.      hydrALAZINE (APRESOLINE) 25 MG tablet TAKE 1 TABLET BY MOUTH THREE TIMES A DAY 270 tablet 1   diphenhydramine-acetaminophen (TYLENOL PM) 25-500 MG TABS tablet Take 2 tablets by mouth at bedtime.     rosuvastatin (CRESTOR) 40 MG tablet Take 1 tablet by mouth at bedtime.     No facility-administered medications prior to visit.     Final Medications at End of Visit    Current Meds  Medication Sig   amLODipine (NORVASC) 10 MG tablet Take 1 tablet (10 mg total) by mouth daily.   aspirin EC 81 MG tablet Take 81 mg by mouth daily. Swallow whole.   clopidogrel (PLAVIX) 75 MG tablet Take 1 tablet (75 mg total) by mouth daily before breakfast.   ezetimibe (ZETIA) 10 MG tablet Take 1 tablet (10 mg total) by mouth daily after supper.   metoprolol (LOPRESSOR) 50 MG tablet Take 50 mg by mouth 2 (two) times daily. Hold if systolic blood pressure (top blood pressure number) less than 100 mmHg or heart rate less than 60 bpm (pulse).   omeprazole (PRILOSEC) 20 MG capsule Take 20 mg by mouth daily.   Polyethylene Glycol 400 0.25 % GEL Apply 1-2 drops to eye daily.   Vitamin D, Ergocalciferol, (DRISDOL) 50000 UNITS CAPS Take 50,000 Units by mouth every Saturday.    [DISCONTINUED] hydrALAZINE (APRESOLINE) 25 MG tablet TAKE 1 TABLET BY MOUTH THREE TIMES A  DAY    Radiology:   No results found.  Cardiac Studies:   Coronary bypass grafting surgery in 2011: LIMA to the LAD, SVG to diagonal branch, free radial artery to OM branch.  Echocardiogram 11/04/2017: LVEF 62%, grade 1 diastolic impairment, moderate concentric hypertrophy, normal left atrial pressure, moderately dilated left atrium, right ventricle is mildly dilated with normal function.  Mild to moderate MR.  Stress Testing Lexiscan Myoview 12/20/2017: Gated SPECT imaging demonstrates hypokinesis of the apical anterior  myocardial wall(s). The left ventricular ejection fraction was calculated or visually estimated to be 59%. SPECT images demonstrate small, fixed perfusion abnormality of moderate intensity in the apical anterior myocardial wall(s). SPECT images also demonstrate small, reversible perfusion abnormality of mild intensity in mid inferior, basal inferior myocardium. This suggests mild inferior ischemia, and possible anteroapical infarct. Low to intermediate risk study.  Lower extremity arterial duplex 11/05/2019:  Abnormal ABIs bilaterally suggestive of peripheral vascular disease.   Moderately abnormal pulse volume recordings at the level of bilateral ankles. Arterial duplex (right) demonstrates multiphasic waveform up to the popliteal artery.  Posterior tibial artery and anterior tibial artery noted to have monophasic waveform. Arterial duplex (left) demonstrates multiphasic waveform except at the level of mid SFA.  Mid SFA demonstrates a monophasic waveform with near occlusion and diffuse plaque. Conclusions: This exam reveals moderately decreased perfusion of the right lower extremity, noted at the post tibial artery level (ABI 0.67) and moderately decreased perfusion of the left lower extremity, noted at the post tibial artery level (ABI 0.73).  EKG 10/31/2021: Sinus rhythm at a rate of 61 bpm.  Normal axis.  Right bundle branch block.  LVH with secondary repolarization abnormality, cannot exclude inferior and anterolateral ischemia.  Compared to EKG 04/03/2021, no significant change.  Assessment     ICD-10-CM   1. Atherosclerosis of native coronary artery of native heart without angina pectoris  I25.10 EKG 12-Lead    Lipid Panel With LDL/HDL Ratio    Lipid Panel With LDL/HDL Ratio    2. Mixed hyperlipidemia  E78.2 Lipid Panel With LDL/HDL Ratio    Lipid Panel With LDL/HDL Ratio    3. S/P CABG x 3  Z95.1     4. Essential hypertension  I10 hydrALAZINE (APRESOLINE) 50 MG tablet      Meds  ordered this encounter  Medications   rosuvastatin (CRESTOR) 40 MG tablet    Sig: Take 1 tablet (40 mg total) by mouth at bedtime.    Dispense:  90 tablet    Refill:  3   hydrALAZINE (APRESOLINE) 50 MG tablet    Sig: Take 1 tablet (50 mg total) by mouth 3 (three) times daily.    Dispense:  270 tablet    Refill:  3     Medications Discontinued During This Encounter  Medication Reason   rosuvastatin (CRESTOR) 40 MG tablet    diphenhydramine-acetaminophen (TYLENOL PM) 25-500 MG TABS tablet    hydrALAZINE (APRESOLINE) 25 MG tablet Reorder    Recommendations:   Sean Gardner is a 66 y.o. Sean Gardner is a 66 y.o. male who presents to the office for follow-up of coronary disease and hypertension and peripheral artery disease.  Past medical history significant for coronary disease with prior CABG in 2001, obesity, OSA on CPAP, mixed hyperlipidemia, hypertension, former smoker, history of lung cancer status post chemotherapy and left lower lobectomy. He has PAD and symptoms of claudication involving his right calf worse than the left calf. He also has DJD bilateral hips.  Underwent right hip arthroplasty on 09/16/2020 but unfortunately ended up having multiple admissions to the hospital due to sepsis and abscess fortunately did not involve the joint and patient recuperated well.  Patient presents for 3-monthfollow-up.  Patient has been without Crestor for an unknown period of time, will therefore resume Crestor and continue Zetia.  We will plan to repeat lipid profile testing in 8 weeks.  Patient's blood pressure is uncontrolled at today's office visit, likely due to recent significant.  We will therefore increase hydralazine from 25 mg to 50 mg 3 times daily.  Patient recently bought a blood pressure cuff and will monitor blood pressure readings at home.  He is instructed to bring a written log with him to his next office visit.  In regard to dyspnea on exertion suspect this is related to  obesity and 25 pound weight gain over the last few months.  However given patient's multiple cardiovascular risk factors discussed options including obtaining echocardiogram and stress test at this time.  Patient opted to first make diet and lifestyle modifications, including weight loss and reevaluate symptoms at next office visit.  Follow-up in 8 weeks, sooner if needed, for hypertension, hyperlipidemia, and dyspnea on exertion.   CAlethia Berthold PA-C 10/31/2021, 9:26 AM Office: [phone]

## 2021-10-31 ENCOUNTER — Encounter: Payer: Self-pay | Admitting: Student

## 2021-10-31 ENCOUNTER — Telehealth: Payer: Self-pay | Admitting: Cardiology

## 2021-10-31 ENCOUNTER — Other Ambulatory Visit: Payer: Self-pay

## 2021-10-31 ENCOUNTER — Telehealth: Payer: Self-pay | Admitting: Student

## 2021-10-31 ENCOUNTER — Ambulatory Visit: Payer: Medicare Other | Admitting: Student

## 2021-10-31 VITALS — BP 154/86 | HR 60 | Resp 16 | Ht 72.0 in | Wt 298.0 lb

## 2021-10-31 DIAGNOSIS — I10 Essential (primary) hypertension: Secondary | ICD-10-CM

## 2021-10-31 DIAGNOSIS — E782 Mixed hyperlipidemia: Secondary | ICD-10-CM

## 2021-10-31 DIAGNOSIS — I2510 Atherosclerotic heart disease of native coronary artery without angina pectoris: Secondary | ICD-10-CM

## 2021-10-31 DIAGNOSIS — Z951 Presence of aortocoronary bypass graft: Secondary | ICD-10-CM

## 2021-10-31 MED ORDER — ROSUVASTATIN CALCIUM 40 MG PO TABS: 40.0000 mg | ORAL_TABLET | Freq: Every day | ORAL | 3 refills | Status: DC

## 2021-10-31 MED ORDER — ROSUVASTATIN CALCIUM 40 MG PO TABS: 40.0000 mg | ORAL_TABLET | Freq: Every day | ORAL | 1 refills | Status: DC

## 2021-10-31 MED ORDER — HYDRALAZINE HCL 50 MG PO TABS: 50.0000 mg | ORAL_TABLET | Freq: Three times a day (TID) | ORAL | 3 refills | Status: DC

## 2021-10-31 NOTE — Telephone Encounter (Signed)
Patient called [name] through paging service requesting for a refill to the local pharmacy.  I have sent Crestor [facility] to [facility] while he is awaiting prior approval from [place] system..    ICD-10-CM   1. Mixed hyperlipidemia  E78.2 rosuvastatin (CRESTOR) 40 MG tablet

## 2021-10-31 NOTE — Telephone Encounter (Signed)
Patient says he spoke with [place] this morning 10/31/21 about sending his new medication through the [place] instead of [facility], but the [place] has not received anything yet.

## 2021-11-03 ENCOUNTER — Other Ambulatory Visit: Payer: Self-pay

## 2021-11-03 DIAGNOSIS — E782 Mixed hyperlipidemia: Secondary | ICD-10-CM

## 2021-11-03 MED ORDER — ROSUVASTATIN CALCIUM 40 MG PO TABS: 40.0000 mg | ORAL_TABLET | Freq: Every day | ORAL | 1 refills | Status: DC

## 2021-11-03 MED ORDER — ROSUVASTATIN CALCIUM 40 MG PO TABS: 40.0000 mg | ORAL_TABLET | Freq: Every day | ORAL | 3 refills | Status: DC

## 2021-11-05 ENCOUNTER — Other Ambulatory Visit: Payer: Self-pay

## 2021-11-05 DIAGNOSIS — E782 Mixed hyperlipidemia: Secondary | ICD-10-CM

## 2021-11-05 DIAGNOSIS — I10 Essential (primary) hypertension: Secondary | ICD-10-CM

## 2021-11-05 MED ORDER — ROSUVASTATIN CALCIUM 40 MG PO TABS: 40.0000 mg | ORAL_TABLET | Freq: Every day | ORAL | 3 refills | Status: DC

## 2021-11-05 MED ORDER — HYDRALAZINE HCL 50 MG PO TABS: 50.0000 mg | ORAL_TABLET | Freq: Three times a day (TID) | ORAL | 3 refills | Status: AC

## 2021-11-25 ENCOUNTER — Other Ambulatory Visit: Payer: Self-pay | Admitting: Cardiology

## 2021-11-25 DIAGNOSIS — E782 Mixed hyperlipidemia: Secondary | ICD-10-CM

## 2021-12-12 ENCOUNTER — Other Ambulatory Visit: Payer: Self-pay | Admitting: Cardiology

## 2021-12-12 DIAGNOSIS — E782 Mixed hyperlipidemia: Secondary | ICD-10-CM

## 2021-12-15 ENCOUNTER — Other Ambulatory Visit: Payer: Self-pay | Admitting: Student

## 2021-12-16 LAB — LIPID PANEL WITH LDL/HDL RATIO
Cholesterol, Total: 106 mg/dL (ref 100–199)
HDL: 34 mg/dL — ABNORMAL LOW (ref >39)
LDL Chol Calc (NIH): 46 mg/dL (ref 0–99)
LDL/HDL Ratio: 1.4 ratio (ref 0.0–3.6)
Triglycerides: 151 mg/dL — ABNORMAL HIGH (ref 0–149)
VLDL Cholesterol Cal: 26 mg/dL (ref 5–40)

## 2021-12-25 NOTE — Progress Notes (Signed)
Primary Physician/Referring:  [name], MD  Patient ID: [name], male    DOB: September 22, 1956, 66 y.o.   MRN: [id number]  Chief Complaint  Patient presents with   Hypertension   Hyperlipidemia   DOE   Follow-up    8 week   HPI:    BARTOSZ LUGINBILL is a 66 y.o. ORLEY LAWRY is a 66 y.o. male who presents to the office for follow-up of coronary disease and hypertension and peripheral artery disease.  Past medical history significant for coronary disease with prior CABG in 2001, obesity, OSA on CPAP, mixed hyperlipidemia, hypertension, former smoker, history of lung cancer status post chemotherapy and left lower lobectomy. He has PAD and symptoms of claudication involving his right calf worse than the left calf. He also has DJD bilateral hips.   Underwent right hip arthroplasty on 09/16/2020 but unfortunately ended up having multiple admissions to the hospital due to sepsis and abscess fortunately did not involve the joint and patient recuperated well.  Patient presents for 8-week follow-up.  Last office visit resumed Crestor which had inadvertently been discontinued, on repeat lipid profile testing triglycerides were mildly elevated but otherwise lipids were well controlled.  Also at last office visit given uncontrolled hypertension increase hydralazine from 25 mg to 50 mg 3 times daily. Since last office visit amlodipine was inadvertently discontinued by patient.  Blood pressure is elevated in the office today and I reviewed home monitoring log which reveals uncontrolled hypertension.  Patient has lost about 5 pounds since last office visit and states dyspnea on exertion is improving.  He is currently being followed closely by [facility] for treatment of skin cancer as well as consideration for left hip replacement.  Denies chest pain.  Past Medical History:  Diagnosis Date   Claudication in peripheral vascular disease ([place])    Hyperlipidemia    Hypertension    Lung cancer ([place])     neuroendocrine lung ca dx 2010   Myocardial infarction [facility]) 07/2000   Peripheral vascular disease ([facility])    Shortness of breath    on exertion,can't lay on his back   Sleep apnea    cpap   Past Surgical History:  Procedure Laterality Date   ACHILLES TENDON SURGERY     ANTERIOR HIP REVISION Right 11/13/2020   Procedure: IRRIGATION AND DEBRIDEMENT RIGHT HIP;  Surgeon: Leandrew Koyanagi, MD;  Location: [place];  Service: Orthopedics;  Laterality: Right;   CORONARY ARTERY BYPASS GRAFT  08/18/2000   LIMA--dLIMA, RIMA-dRCA, left RA-OM2, SVG-D2   FLEXIBLE SIGMOIDOSCOPY N/A 01/18/2013   Procedure: FLEXIBLE SIGMOIDOSCOPY;  Surgeon: Arta Silence, MD;  Location: [facility];  Service: Endoscopy;  Laterality: N/A;   HERNIA REPAIR     inguinal   HOT HEMOSTASIS N/A 01/18/2013   Procedure: HOT HEMOSTASIS (ARGON PLASMA COAGULATION/BICAP);  Surgeon: Arta Silence, MD;  Location: [facility];  Service: Endoscopy;  Laterality: N/A;   INCISION AND DRAINAGE HIP Right 12/05/2020   Procedure: IRRIGATION AND DEBRIDEMENT RIGHT HIP;  Surgeon: Leandrew Koyanagi, MD;  Location: [place];  Service: Orthopedics;  Laterality: Right;   LUNG SURGERY Left 01/29/2009   left lower lobectomy    TOTAL HIP ARTHROPLASTY Right 09/16/2020   Procedure: RIGHT TOTAL HIP ARTHROPLASTY ANTERIOR APPROACH;  Surgeon: Leandrew Koyanagi, MD;  Location: [place];  Service: Orthopedics;  Laterality: Right;   Family History  Problem Relation Age of Onset   Hypertension Mother    Lung cancer Father     Social History  Tobacco Use   Smoking status: Former    Packs/day: 2.00    Years: 20.00    Pack years: 40.00    Types: Cigarettes    Quit date: 10/19/2008    Years since quitting: 13.1   Smokeless tobacco: Never  Substance Use Topics   Alcohol use: Yes    Comment: occ   Marital Status: Married  ROS  Review of Systems  Constitutional: Positive for weight loss.  Cardiovascular:  Positive for claudication (stable) and dyspnea on exertion  (improving). Negative for chest pain, leg swelling, near-syncope, orthopnea, palpitations, paroxysmal nocturnal dyspnea and syncope.  Respiratory:  Negative for shortness of breath.   Neurological:  Negative for dizziness.  Objective  Blood pressure (!) 152/65, pulse 60, temperature 98 F (36.7 C), resp. rate 16, height 6' (1.829 m), weight 293 lb (132.9 kg), SpO2 95 %.  Vitals with BMI 12/26/2021 10/31/2021 04/03/2021  Height '6\' 0"'  '6\' 0"'  '6\' 0"'   Weight 293 lbs 298 lbs 273 lbs 10 oz  BMI 39.73 17.91 50.5  Systolic 697 948 016  Diastolic 65 86 73  Pulse 60 60 60     Physical Exam Vitals reviewed.  Constitutional:      Appearance: He is well-developed. He is obese.     Comments: Morbidly obese  Cardiovascular:     Rate and Rhythm: Normal rate and regular rhythm.     Pulses: Intact distal pulses.          Femoral pulses are 2+ on the right side and 2+ on the left side.      Popliteal pulses are 0 on the right side and 0 on the left side.       Dorsalis pedis pulses are 0 on the right side and 0 on the left side.       Posterior tibial pulses are 0 on the right side and 2+ on the left side.     Heart sounds: Normal heart sounds. No murmur heard.   No gallop.     Comments: No JVD. Pulmonary:     Effort: Pulmonary effort is normal. No accessory muscle usage or respiratory distress.     Breath sounds: Normal breath sounds.  Abdominal:     Hernia: A hernia is present. Hernia is present in the ventral area (reducible).  Musculoskeletal:     Right lower leg: No edema.     Left lower leg: No edema.  Physical exam unchanged compared to previous office visit.  Laboratory examination:   No results for input(s): NA, K, CL, CO2, GLUCOSE, BUN, CREATININE, CALCIUM, GFRNONAA, GFRAA in the last 8760 hours.  CrCl cannot be calculated (Patient's most recent lab result is older than the maximum 21 days allowed.).  CMP Latest Ref Rng & Units 12/06/2020 11/14/2020 11/13/2020  Glucose 70 - 99 mg/dL  101(H) 133(H) 97  BUN 8 - 23 mg/dL '10 15 14  ' Creatinine 0.61 - 1.24 mg/dL 0.92 1.11 1.04  Sodium 135 - 145 mmol/L 137 137 139  Potassium 3.5 - 5.1 mmol/L 4.4 5.0 4.8  Chloride 98 - 111 mmol/L 107 106 105  CO2 22 - 32 mmol/L '22 25 26  ' Calcium 8.9 - 10.3 mg/dL 8.8(L) 9.2 9.7  Total Protein 6.5 - 8.1 g/dL - - -  Total Bilirubin 0.3 - 1.2 mg/dL - - -  Alkaline Phos 38 - 126 U/L - - -  AST 15 - 41 U/L - - -  ALT 0 - 44 U/L - - -  CBC Latest Ref Rng & Units 12/06/2020 11/14/2020 11/13/2020  WBC 4.0 - 10.5 K/uL 5.0 7.6 7.5  Hemoglobin 13.0 - 17.0 g/dL 10.1(L) 10.5(L) 13.9  Hematocrit 39.0 - 52.0 % 33.2(L) 33.9(L) 42.5  Platelets 150 - 400 K/uL 262 300 402(H)   Lipid Panel Recent Labs    04/03/21 0944 12/15/21 1239  CHOL 114 106  TRIG 143 151*  LDLCALC 54 46  HDL 35* 34*    TSH No results for input(s): TSH in the last 8760 hours.  External labs:   Labs 12/24/2020:   Total cholesterol 218, triglycerides 151, HDL 38, LDL 140.  HB 11.7/HCT 37.6, platelets 319.  Serum glucose 89 mg, sodium 139, potassium 4.7, BUN 14, creatinine 0.93.  CMP normal.  Hemoglobin 15.800 G/ 01/09/2020 Creatinine, Serum 0.900 mg/ 02/09/2020 Potassium 5.100 mm 02/09/2020  02/09/2020: Serum glucose 8900 g, BUN 15, creatinine 0.90, EGFR >80 mL.  Potassium 5.1.  Allergies   Allergies  Allergen Reactions   Lisinopril Other (See Comments)    Hyperkalemia   Zolpidem Tartrate Other (See Comments)    Felt fuzzy and hallucinations   Medications Prior to Visit:   Outpatient Medications Prior to Visit  Medication Sig Dispense Refill   aspirin EC 81 MG tablet Take 81 mg by mouth daily. Swallow whole.     clopidogrel (PLAVIX) 75 MG tablet Take 1 tablet (75 mg total) by mouth daily before breakfast. 1 tablet 0   ezetimibe (ZETIA) 10 MG tablet Take 1 tablet (10 mg total) by mouth daily after supper. 90 tablet 3   hydrALAZINE (APRESOLINE) 50 MG tablet Take 1 tablet (50 mg total) by mouth 3 (three) times  daily. 270 tablet 3   metoprolol (LOPRESSOR) 50 MG tablet Take 50 mg by mouth 2 (two) times daily. Hold if systolic blood pressure (top blood pressure number) less than 100 mmHg or heart rate less than 60 bpm (pulse).     omeprazole (PRILOSEC) 20 MG capsule Take 20 mg by mouth daily.     Polyethylene Glycol 400 0.25 % GEL Apply 1-2 drops to eye daily.     rosuvastatin (CRESTOR) 40 MG tablet TAKE 1 TABLET BY MOUTH EVERYDAY AT BEDTIME 90 tablet 1   tiotropium (SPIRIVA) 18 MCG inhalation capsule Place 18 mcg into inhaler and inhale daily.     Vitamin D, Ergocalciferol, (DRISDOL) 50000 UNITS CAPS Take 50,000 Units by mouth every Saturday.      amLODipine (NORVASC) 10 MG tablet Take 1 tablet (10 mg total) by mouth daily. 90 tablet 3   No facility-administered medications prior to visit.   Final Medications at End of Visit    Current Meds  Medication Sig   aspirin EC 81 MG tablet Take 81 mg by mouth daily. Swallow whole.   clopidogrel (PLAVIX) 75 MG tablet Take 1 tablet (75 mg total) by mouth daily before breakfast.   ezetimibe (ZETIA) 10 MG tablet Take 1 tablet (10 mg total) by mouth daily after supper.   hydrALAZINE (APRESOLINE) 50 MG tablet Take 1 tablet (50 mg total) by mouth 3 (three) times daily.   metoprolol (LOPRESSOR) 50 MG tablet Take 50 mg by mouth 2 (two) times daily. Hold if systolic blood pressure (top blood pressure number) less than 100 mmHg or heart rate less than 60 bpm (pulse).   omeprazole (PRILOSEC) 20 MG capsule Take 20 mg by mouth daily.   Polyethylene Glycol 400 0.25 % GEL Apply 1-2 drops to eye daily.   rosuvastatin (CRESTOR) 40 MG tablet TAKE  1 TABLET BY MOUTH EVERYDAY AT BEDTIME   tiotropium (SPIRIVA) 18 MCG inhalation capsule Place 18 mcg into inhaler and inhale daily.   Vitamin D, Ergocalciferol, (DRISDOL) 50000 UNITS CAPS Take 50,000 Units by mouth every Saturday.    Radiology:   No results found.  Cardiac Studies:   Coronary bypass grafting surgery in 2011:  LIMA to the LAD, SVG to diagonal branch, free radial artery to OM branch.  Echocardiogram 11/04/2017: LVEF 88%, grade 1 diastolic impairment, moderate concentric hypertrophy, normal left atrial pressure, moderately dilated left atrium, right ventricle is mildly dilated with normal function.  Mild to moderate MR.  Stress Testing Lexiscan Myoview 12/20/2017: Gated SPECT imaging demonstrates hypokinesis of the apical anterior myocardial wall(s). The left ventricular ejection fraction was calculated or visually estimated to be 59%. SPECT images demonstrate small, fixed perfusion abnormality of moderate intensity in the apical anterior myocardial wall(s). SPECT images also demonstrate small, reversible perfusion abnormality of mild intensity in mid inferior, basal inferior myocardium. This suggests mild inferior ischemia, and possible anteroapical infarct. Low to intermediate risk study.  Lower extremity arterial duplex 11/05/2019:  Abnormal ABIs bilaterally suggestive of peripheral vascular disease.   Moderately abnormal pulse volume recordings at the level of bilateral ankles. Arterial duplex (right) demonstrates multiphasic waveform up to the popliteal artery.  Posterior tibial artery and anterior tibial artery noted to have monophasic waveform. Arterial duplex (left) demonstrates multiphasic waveform except at the level of mid SFA.  Mid SFA demonstrates a monophasic waveform with near occlusion and diffuse plaque. Conclusions: This exam reveals moderately decreased perfusion of the right lower extremity, noted at the post tibial artery level (ABI 0.67) and moderately decreased perfusion of the left lower extremity, noted at the post tibial artery level (ABI 0.73).  EKG   10/31/2021: Sinus rhythm at a rate of 61 bpm.  Normal axis.  Right bundle branch block.  LVH with secondary repolarization abnormality, cannot exclude inferior and anterolateral ischemia.  Compared to EKG 04/03/2021, no significant  change.  Assessment     ICD-10-CM   1. S/P CABG x 3  Z95.1     2. Mixed hyperlipidemia  E78.2     3. Essential hypertension  I10 amLODipine (NORVASC) 10 MG tablet      Meds ordered this encounter  Medications   amLODipine (NORVASC) 10 MG tablet    Sig: Take 1 tablet (10 mg total) by mouth daily.    Dispense:  90 tablet    Refill:  1     Medications Discontinued During This Encounter  Medication Reason   amLODipine (NORVASC) 10 MG tablet     Recommendations:   JEZIEL HOFFMANN is a 66 y.o. RAMERE DOWNS is a 66 y.o. male who presents to the office for follow-up of coronary disease and hypertension and peripheral artery disease.  Past medical history significant for coronary disease with prior CABG in 2001, obesity, OSA on CPAP, mixed hyperlipidemia, hypertension, former smoker, history of lung cancer status post chemotherapy and left lower lobectomy. He has PAD and symptoms of claudication involving his right calf worse than the left calf. He also has DJD bilateral hips.   Underwent right hip arthroplasty on 09/16/2020 but unfortunately ended up having multiple admissions to the hospital due to sepsis and abscess fortunately did not involve the joint and patient recuperated well.  Patient presents for 8-week follow-up.  Last office visit resumed Crestor which had inadvertently been discontinued, on repeat lipid profile testing triglycerides were mildly elevated but otherwise lipids  were well controlled.  Also at last office visit given uncontrolled hypertension increase hydralazine from 25 mg to 50 mg 3 times daily.  Amlodipine had inadvertently been discontinued by patient since last office visit, will resume this given uncontrolled hypertension.  Patient will continue to monitor blood pressure at home and notify our office if it remains >130/80 mmHg.  Again discussed with patient that given his multiple cardiovascular risk factors and dyspnea could consider obtaining echocardiogram  and chemical stress test.  However as his dyspnea on exertion is improving with late last patient wishes to continue to hold off on further cardiac testing at this time.  We will allow for more time for patient to focus on weight loss and reevaluate symptoms.  Notably patient is considering left knee replacement and may need further cardiac testing for surgical risk stratification.  Follow-up in 3 months, sooner if needed.   Alethia Berthold, PA-C 12/26/2021, 9:53 AM Office: [phone]

## 2021-12-26 ENCOUNTER — Other Ambulatory Visit: Payer: Self-pay

## 2021-12-26 ENCOUNTER — Ambulatory Visit: Payer: Medicare Other | Admitting: Student

## 2021-12-26 ENCOUNTER — Encounter: Payer: Self-pay | Admitting: Student

## 2021-12-26 VITALS — BP 152/65 | HR 60 | Temp 98.0°F | Resp 16 | Ht 72.0 in | Wt 293.0 lb

## 2021-12-26 DIAGNOSIS — E782 Mixed hyperlipidemia: Secondary | ICD-10-CM

## 2021-12-26 DIAGNOSIS — Z951 Presence of aortocoronary bypass graft: Secondary | ICD-10-CM

## 2021-12-26 DIAGNOSIS — I10 Essential (primary) hypertension: Secondary | ICD-10-CM

## 2021-12-26 MED ORDER — AMLODIPINE BESYLATE 10 MG PO TABS
10.0000 mg | ORAL_TABLET | Freq: Every day | ORAL | 1 refills | Status: AC
Start: 2021-12-26 — End: 2024-08-30

## 2021-12-29 ENCOUNTER — Telehealth: Payer: Self-pay | Admitting: *Deleted

## 2021-12-29 NOTE — Telephone Encounter (Signed)
Ortho bundle 1 year call completed. ?

## 2022-01-13 ENCOUNTER — Other Ambulatory Visit: Payer: Self-pay | Admitting: Orthopedic Surgery

## 2022-01-13 ENCOUNTER — Other Ambulatory Visit (HOSPITAL_COMMUNITY): Payer: Self-pay | Admitting: Orthopedic Surgery

## 2022-01-13 DIAGNOSIS — M1612 Unilateral primary osteoarthritis, left hip: Secondary | ICD-10-CM

## 2022-01-13 DIAGNOSIS — M25552 Pain in left hip: Secondary | ICD-10-CM

## 2022-01-15 ENCOUNTER — Ambulatory Visit (HOSPITAL_COMMUNITY)
Admission: RE | Admit: 2022-01-15 | Discharge: 2022-01-15 | Disposition: A | Discharge status: Home / Self Care | Payer: Medicare Other | Source: Ambulatory Visit | Attending: Orthopedic Surgery | Admitting: Orthopedic Surgery

## 2022-01-15 DIAGNOSIS — M1612 Unilateral primary osteoarthritis, left hip: Secondary | ICD-10-CM | POA: Diagnosis present

## 2022-01-15 DIAGNOSIS — M25552 Pain in left hip: Secondary | ICD-10-CM

## 2022-01-15 MED ORDER — IOHEXOL 180 MG/ML  SOLN
10.0000 mL | Freq: Once | INTRAMUSCULAR | Status: AC | PRN
  Administered 2022-01-15: 10 mL via INTRA_ARTICULAR

## 2022-01-15 MED ORDER — BUPIVACAINE HCL (PF) 0.25 % IJ SOLN
5.0000 mL | Freq: Once | INTRAMUSCULAR | Status: AC
  Administered 2022-01-15: 5 mL

## 2022-01-15 MED ORDER — METHYLPREDNISOLONE ACETATE 80 MG/ML IJ SUSP
INTRAMUSCULAR | Status: AC
  Filled 2022-01-15: qty 1

## 2022-01-15 MED ORDER — LIDOCAINE HCL (PF) 1 % IJ SOLN
5.0000 mL | Freq: Once | INTRAMUSCULAR | Status: AC
  Administered 2022-01-15: 5 mL via INTRADERMAL

## 2022-01-15 MED ORDER — METHYLPREDNISOLONE ACETATE 40 MG/ML INJ SUSP (RADIOLOG
80.0000 mg | Freq: Once | INTRAMUSCULAR | Status: AC
  Administered 2022-01-15: 80 mg via INTRA_ARTICULAR

## 2022-01-15 MED ORDER — BUPIVACAINE HCL (PF) 0.25 % IJ SOLN
INTRAMUSCULAR | Status: AC
  Filled 2022-01-15: qty 30

## 2022-01-15 NOTE — Procedures (Signed)
PROCEDURE SUMMARY: ? ?Successful fluoro guided left hip steroid injection.  ?EBL < 1 mL ? ?Please see imaging section of Epic for full dictation. ? ?Tera Mater PA-C ?01/15/2022, 10:13 AM ? ? ? ? ?

## 2022-01-28 ENCOUNTER — Ambulatory Visit (HOSPITAL_COMMUNITY): Payer: Medicare Other

## 2022-01-28 ENCOUNTER — Encounter (HOSPITAL_COMMUNITY): Payer: Self-pay

## 2022-02-05 ENCOUNTER — Telehealth: Payer: Self-pay | Admitting: Physician Assistant

## 2022-02-05 NOTE — Telephone Encounter (Signed)
Patient called asked forma call back. Patient need to ask Mendel Ryder a question concerning one of his family members. Patient said his grand daughter hurt her achilles and want to know if they need to bring her in or take her to the emergency room? The number to contact patient [phone]  ?

## 2022-02-05 NOTE — Telephone Encounter (Signed)
Pt calling again.

## 2022-02-06 NOTE — Telephone Encounter (Signed)
Spoke to patient

## 2022-03-30 ENCOUNTER — Ambulatory Visit: Payer: Medicare Other | Admitting: Student

## 2022-03-30 NOTE — Progress Notes (Deleted)
Primary Physician/Referring:  [name], MD  Patient ID: Sean Gardner, male    DOB: 1956/02/07, 66 y.o.   MRN: [id number]  No chief complaint on file.  HPI:    Sean Gardner is a 66 y.o. Sean Gardner is a 66 y.o. male who presents to the office for follow-up of coronary disease and hypertension and peripheral artery disease.  Past medical history significant for coronary disease with prior CABG in 2001, obesity, OSA on CPAP, mixed hyperlipidemia, hypertension, former smoker, history of lung cancer status post chemotherapy and left lower lobectomy. He has PAD and symptoms of claudication involving his right calf worse than the left calf. He also has DJD bilateral hips.   Underwent right hip arthroplasty on 09/16/2020 but unfortunately ended up having multiple admissions to the hospital due to sepsis and abscess fortunately did not involve the joint and patient recuperated well.  Patient was last seen in the office 12/26/2021 at which time resumed amlodipine which had inadvertently been discontinued and as patient's dyspnea had improved he opted to continue with watchful waiting instead of proceeding with echocardiogram and stress testing.  Patient now presents for 26-monthfollow-up. ***  ***External labs - TG high   Patient presents for 8-week follow-up.  Last office visit resumed Crestor which had inadvertently been discontinued, on repeat lipid profile testing triglycerides were mildly elevated but otherwise lipids were well controlled.  Also at last office visit given uncontrolled hypertension increase hydralazine from 25 mg to 50 mg 3 times daily. Since last office visit amlodipine was inadvertently discontinued by patient.  Blood pressure is elevated in the office today and I reviewed home monitoring log which reveals uncontrolled hypertension.  Patient has lost about 5 pounds since last office visit and states dyspnea on exertion is improving.  [facility] as consideration for left hip replacement.  Denies chest pain.  Past Medical History:  Diagnosis Date   Claud[place] vascular [place]    Hyperlipidemia    Hypertension    [place]    neuroendocrine lung ca dx 2010   Myocardial infarction ([facility] 07/2000   Peripheral vascular disease ([facility])    Shortness of breath    on exertion,can't lay on his back   Sleep apnea    cpap   Past Surgical History:  Procedure Laterality Date   ACHILLES TENDON SURGERY     ANTERIOR HIP REVISION Right 11/13/2020   Procedure: IRRIGATION AND DEBRIDEMENT RIGHT HIP;  Surgeon: XLeandrew Koyanagi MD;  [place]  Service: Orthopedics;  Laterality: Right;   CORONARY ARTERY BYPASS GRAFT  08/18/2000   LIMA--dLIMA, RIMA-dRCA, left RA-OM2, SVG-D2   FLEXIBLE SIGMOIDOSCOPY N/A 01/18/2013   Procedure: FLEXIBLE SIGMOIDOSCOPY;  Surgeon: WArta Silence MD;  Location: [facility];  Service: Endoscopy;  Laterality: N/A;   HERNIA REPAIR     inguinal   HOT HEMOSTASIS N/A 01/18/2013   Procedure: HOT HEMOSTASIS (ARGON PLASMA COAGULATION/BICAP);  Surgeon: WArta Silence MD;  [facility];  Service: Endoscopy;  Laterality: N/A;   INCISION AND DRAINAGE HIP Right 12/05/2020   Procedure: IRRIGATION AND DEBRIDEMENT RIGHT HIP;  Surgeon: XLeandrew Koyanagi MD;  [place]  Service: Orthopedics;  Laterality: Right;   LUNG SURGERY Left 01/29/2009   left lower lobectomy    TOTAL HIP ARTHROPLASTY Right 09/16/2020   Procedure: RIGHT TOTAL HIP ARTHROPLASTY ANTERIOR APPROACH;  Surgeon: XLeandrew Koyanagi MD;  Location: [place];  Service: Orthopedics;  Laterality: Right;   Family History  Problem Relation Age of Onset   Hypertension Mother    Lung cancer Father     Social History   Tobacco Use   Smoking status: Former    Packs/day: 2.00    Years: 20.00    Total pack years: 40.00    Types: Cigarettes    Quit date: 10/19/2008    Years since quitting: 13.4    Smokeless tobacco: Never  Substance Use Topics   Alcohol use: Yes    Comment: occ   Marital Status: Married  ROS  Review of Systems  Constitutional: Positive for weight loss.  Cardiovascular:  Positive for claudication (stable) and dyspnea on exertion (improving). Negative for chest pain, leg swelling, near-syncope, orthopnea, palpitations, paroxysmal nocturnal dyspnea and syncope.  Respiratory:  Negative for shortness of breath.   Neurological:  Negative for dizziness.   Objective  There were no vitals taken for this visit.     12/26/2021    9:24 AM 10/31/2021    8:49 AM 04/03/2021    8:39 AM  Vitals with BMI  Height _0  _1  _2   Weight 293 lbs 298 lbs 273 lbs 10 oz  BMI 39.73 68.12 75.1  Systolic 700 174 944  Diastolic 65 86 73  Pulse 60 60 60     Physical Exam Vitals reviewed.  Constitutional:      Appearance: He is well-developed. He is obese.     Comments: Morbidly obese  Cardiovascular:     Rate and Rhythm: Normal rate and regular rhythm.     Pulses: Intact distal pulses.          Femoral pulses are 2+ on the right side and 2+ on the left side.      Popliteal pulses are 0 on the right side and 0 on the left side.       Dorsalis pedis pulses are 0 on the right side and 0 on the left side.       Posterior tibial pulses are 0 on the right side and 2+ on the left side.     Heart sounds: Normal heart sounds. No murmur heard.    No gallop.     Comments: No JVD. Pulmonary:     Effort: Pulmonary effort is normal. No accessory muscle usage or respiratory distress.     Breath sounds: Normal breath sounds.  Abdominal:     Hernia: A hernia is present. Hernia is present in the ventral area (reducible).  Musculoskeletal:     Right lower leg: No edema.     Left lower leg: No edema.   Physical exam unchanged compared to previous office visit.  Laboratory examination:   No results for input(s): "NA", "K", "CL", "CO2", "GLUCOSE", "BUN", "CREATININE", "CALCIUM",  "GFRNONAA", "GFRAA" in the last 8760 hours.  CrCl cannot be calculated (Patient's most recent lab result is older than the maximum 21 days allowed.).     Latest Ref Rng & Units 12/06/2020    1:08 AM 11/14/2020    5:42 AM 11/13/2020    1:07 PM  CMP  Glucose 70 - 99 mg/dL 101  133  97   BUN 8 - 23 mg/dL _3 Creatinine 0.61 - 1.24 mg/dL 0.92  1.11  1.04   Sodium 135 - 145 mmol/L 137  137  139   Potassium 3.5 - 5.1 mmol/L 4.4  5.0  4.8  Chloride 98 - 111 mmol/L 107  106  105   CO2 22 - 32 mmol/L _0 Calcium 8.9 - 10.3 mg/dL 8.8  9.2  9.7       Latest Ref Rng & Units 12/06/2020    1:08 AM 11/14/2020    5:42 AM 11/13/2020    2:41 PM  CBC  WBC 4.0 - 10.5 K/uL 5.0  7.6  7.5   Hemoglobin 13.0 - 17.0 g/dL 10.1  10.5  13.9   Hematocrit 39.0 - 52.0 % 33.2  33.9  42.5   Platelets 150 - 400 K/uL 262  300  402    Lipid Panel Recent Labs    04/03/21 0944 12/15/21 1239  CHOL 114 106  TRIG 143 151*  LDLCALC 54 46  HDL 35* 34*     TSH No results for input(s): "TSH" in the last 8760 hours.  External labs:   Labs 12/24/2020:   Total cholesterol 218, triglycerides 151, HDL 38, LDL 140.  HB 11.7/HCT 37.6, platelets 319.  Serum glucose 89 mg, sodium 139, potassium 4.7, BUN 14, creatinine 0.93.  CMP normal.  Hemoglobin 15.800 G/ 01/09/2020 Creatinine, Serum 0.900 mg/ 02/09/2020 Potassium 5.100 mm 02/09/2020  02/09/2020: Serum glucose 8900 g, BUN 15, creatinine 0.90, EGFR >80 mL.  Potassium 5.1.  Allergies   Allergies  Allergen Reactions   Lisinopril Other (See Comments)    Hyperkalemia   Zolpidem Tartrate Other (See Comments)    Felt fuzzy and hallucinations   Medications Prior to Visit:   Outpatient Medications Prior to Visit  Medication Sig Dispense Refill   amLODipine (NORVASC) 10 MG tablet Take 1 tablet (10 mg total) by mouth daily. 90 tablet 1   aspirin EC 81 MG tablet Take 81 mg by mouth daily. Swallow whole.     clopidogrel (PLAVIX) 75 MG tablet Take  1 tablet (75 mg total) by mouth daily before breakfast. 1 tablet 0   ezetimibe (ZETIA) 10 MG tablet Take 1 tablet (10 mg total) by mouth daily after supper. 90 tablet 3   hydrALAZINE (APRESOLINE) 50 MG tablet Take 1 tablet (50 mg total) by mouth 3 (three) times daily. 270 tablet 3   metoprolol (LOPRESSOR) 50 MG tablet Take 50 mg by mouth 2 (two) times daily. Hold if systolic blood pressure (top blood pressure number) less than 100 mmHg or heart rate less than 60 bpm (pulse).     omeprazole (PRILOSEC) 20 MG capsule Take 20 mg by mouth daily.     Polyethylene Glycol 400 0.25 % GEL Apply 1-2 drops to eye daily.     rosuvastatin (CRESTOR) 40 MG tablet TAKE 1 TABLET BY MOUTH EVERYDAY AT BEDTIME 90 tablet 1   tiotropium (SPIRIVA) 18 MCG inhalation capsule Place 18 mcg into inhaler and inhale daily.     Vitamin D, Ergocalciferol, (DRISDOL) 50000 UNITS CAPS Take 50,000 Units by mouth every Saturday.      No facility-administered medications prior to visit.   Final Medications at End of Visit    No outpatient medications have been marked as taking for the 03/30/22 encounter (Appointment) with Sean Gardner, Sean Hasty Gardner, Sean Gardner.   Radiology:   No results found.  Cardiac Studies:   Coronary bypass grafting surgery in 2011: LIMA to the LAD, SVG to diagonal branch, free radial artery to OM branch.  Echocardiogram 11/04/2017: LVEF 56%, grade 1 diastolic impairment, moderate concentric hypertrophy, normal left atrial pressure, moderately dilated left atrium, right ventricle is mildly dilated with normal  function.  Mild to moderate MR.  Stress Testing Lexiscan Myoview 12/20/2017: Gated SPECT imaging demonstrates hypokinesis of the apical anterior myocardial wall(s). The left ventricular ejection fraction was calculated or visually estimated to be 59%. SPECT images demonstrate small, fixed perfusion abnormality of moderate intensity in the apical anterior myocardial wall(s). SPECT images also demonstrate small,  reversible perfusion abnormality of mild intensity in mid inferior, basal inferior myocardium. This suggests mild inferior ischemia, and possible anteroapical infarct. Low to intermediate risk study.  Lower extremity arterial duplex 11/05/2019:  Abnormal ABIs bilaterally suggestive of peripheral vascular disease.   Moderately abnormal pulse volume recordings at the level of bilateral ankles. Arterial duplex (right) demonstrates multiphasic waveform up to the popliteal artery.  Posterior tibial artery and anterior tibial artery noted to have monophasic waveform. Arterial duplex (left) demonstrates multiphasic waveform except at the level of mid SFA.  Mid SFA demonstrates a monophasic waveform with near occlusion and diffuse plaque. Conclusions: This exam reveals moderately decreased perfusion of the right lower extremity, noted at the post tibial artery level (ABI 0.67) and moderately decreased perfusion of the left lower extremity, noted at the post tibial artery level (ABI 0.73).  EKG   10/31/2021: Sinus rhythm at a rate of 61 bpm.  Normal axis.  Right bundle branch block.  LVH with secondary repolarization abnormality, cannot exclude inferior and anterolateral ischemia.  Compared to EKG 04/03/2021, no significant change.  Assessment   No diagnosis found.   No orders of the defined types were placed in this encounter.    There are no discontinued medications.   Recommendations:   Sean Gardner is a 66 y.o. Sean Gardner is a 66 y.o. male who presents to the office for follow-up of coronary disease and hypertension and peripheral artery disease.  Past medical history significant for coronary disease with prior CABG in 2001, obesity, OSA on CPAP, mixed hyperlipidemia, hypertension, former smoker, history of lung cancer status post chemotherapy and left lower lobectomy. He has PAD and symptoms of claudication involving his right calf worse than the left calf. He also has DJD bilateral hips.    Underwent right hip arthroplasty on 09/16/2020 but unfortunately ended up having multiple admissions to the hospital due to sepsis and abscess fortunately did not involve the joint and patient recuperated well.  Patient was last seen in the office 12/26/2021 at which time resumed amlodipine which had inadvertently been discontinued and as patient's dyspnea had improved he opted to continue with watchful waiting instead of proceeding with echocardiogram and stress testing.  Patient now presents for 22-monthfollow-up. ***  ***  Patient presents for 8-week follow-up.  Last office visit resumed Crestor which had inadvertently been discontinued, on repeat lipid profile testing triglycerides were mildly elevated but otherwise lipids were well controlled.  Also at last office visit given uncontrolled hypertension increase hydralazine from 25 mg to 50 mg 3 times daily.  Amlodipine had inadvertently been discontinued by patient since last office visit, will resume this given uncontrolled hypertension.  Patient will continue to monitor blood pressure at home and notify our office if it remains >130/80 mmHg.  Again discussed with patient that given his multiple cardiovascular risk factors and dyspnea could consider obtaining echocardiogram and chemical stress test.  However as his dyspnea on exertion is improving with late last patient wishes to continue to hold off on further cardiac testing at this time.  We will allow for more time for patient to focus on weight loss and reevaluate symptoms.  Notably patient is considering left knee replacement and may need further cardiac testing for surgical risk stratification.  Follow-up in 3 months, sooner if needed.   Sean Berthold, Sean Gardner 03/30/2022, 8:38 AM Office: [phone]

## 2022-04-09 ENCOUNTER — Ambulatory Visit: Payer: Medicare Other | Admitting: Student

## 2022-04-14 ENCOUNTER — Ambulatory Visit: Payer: Medicare Other | Admitting: Student

## 2022-04-14 ENCOUNTER — Encounter: Payer: Self-pay | Admitting: Student

## 2022-04-14 VITALS — BP 120/67 | HR 61 | Temp 98.7°F | Resp 16 | Ht 72.0 in | Wt 296.2 lb

## 2022-04-14 DIAGNOSIS — E782 Mixed hyperlipidemia: Secondary | ICD-10-CM

## 2022-04-14 DIAGNOSIS — I2510 Atherosclerotic heart disease of native coronary artery without angina pectoris: Secondary | ICD-10-CM

## 2022-04-14 DIAGNOSIS — I10 Essential (primary) hypertension: Secondary | ICD-10-CM

## 2022-04-14 DIAGNOSIS — Z951 Presence of aortocoronary bypass graft: Secondary | ICD-10-CM

## 2022-05-28 ENCOUNTER — Ambulatory Visit: Payer: Medicare Other

## 2022-05-28 DIAGNOSIS — I2510 Atherosclerotic heart disease of native coronary artery without angina pectoris: Secondary | ICD-10-CM

## 2022-06-08 ENCOUNTER — Ambulatory Visit: Payer: Medicare Other

## 2022-06-08 DIAGNOSIS — I2510 Atherosclerotic heart disease of native coronary artery without angina pectoris: Secondary | ICD-10-CM

## 2022-06-09 NOTE — Progress Notes (Signed)
JG,  Abnormal stress test, prior CABG, recent worsening dyspnea. Seeing you on 10/5.  Thanks MJP

## 2022-06-10 ENCOUNTER — Other Ambulatory Visit: Payer: Medicare Other

## 2022-06-14 ENCOUNTER — Encounter: Payer: Self-pay | Admitting: Cardiology

## 2022-06-19 ENCOUNTER — Ambulatory Visit: Payer: Medicare Other | Admitting: Cardiology

## 2022-06-23 NOTE — Progress Notes (Signed)
Please change his OV date to 2 weeks with me. He is aware of the results of the tests

## 2022-07-02 ENCOUNTER — Ambulatory Visit: Payer: Medicare Other | Admitting: Cardiology

## 2022-07-02 ENCOUNTER — Encounter: Payer: Self-pay | Admitting: Cardiology

## 2022-07-02 VITALS — BP 138/67 | HR 59 | Temp 98.3°F | Resp 16 | Ht 72.0 in | Wt 293.0 lb

## 2022-07-02 DIAGNOSIS — E782 Mixed hyperlipidemia: Secondary | ICD-10-CM

## 2022-07-02 DIAGNOSIS — I2510 Atherosclerotic heart disease of native coronary artery without angina pectoris: Secondary | ICD-10-CM

## 2022-07-02 DIAGNOSIS — I10 Essential (primary) hypertension: Secondary | ICD-10-CM

## 2022-07-02 DIAGNOSIS — I739 Peripheral vascular disease, unspecified: Secondary | ICD-10-CM

## 2022-07-02 NOTE — H&P (View-Only) (Signed)
Primary Physician/Referring:  [name], MD  Patient ID: [name], male    DOB: 04/12/1956, 66 y.o.   MRN: [id number]  Chief Complaint  Patient presents with   Results   Follow-up   HPI:    DONTRELL STUCK is a 66 y.o. with CAD SP CABG in 2001, obesity, OSA on CPAP, mixed hyperlipidemia, hypertension, former smoker, history of lung cancer status post chemotherapy and left lower lobectomy, PAD and symptoms of claudication involving his right calf worse than the left calf.   This is a 15-month office visit for follow-up of coronary disease and hypertension.  He was scheduled for nuclear stress test due to dyspnea on exertion.  No change in dyspnea, denies PND or orthopnea.  No leg edema.  He is tolerating current medications without issue.  Past Medical History:  Diagnosis Date   Claudication in peripheral vascular disease ([place])    Hyperlipidemia    Hypertension    Lung cancer ([place])    neuroendocrine lung ca dx 2010   Myocardial infarction [facility]) 07/2000   Peripheral vascular disease (HCC)    Shortness of breath    on exertion,can't lay on his back   Sleep apnea    cpap   Past Surgical History:  Procedure Laterality Date   ACHILLES TENDON SURGERY     ANTERIOR HIP REVISION Right 11/13/2020   Procedure: IRRIGATION AND DEBRIDEMENT RIGHT HIP;  Surgeon: Leandrew Koyanagi, MD;  Location: [facility] [facility];  Service: Orthopedics;  Laterality: Right;   CORONARY ARTERY BYPASS GRAFT  08/18/2000   LIMA--dLIMA, RIMA-dRCA, left RA-OM2, SVG-D2   FLEXIBLE SIGMOIDOSCOPY N/A 01/18/2013   Procedure: FLEXIBLE SIGMOIDOSCOPY;  Surgeon: Arta Silence, MD;  Location: [facility];  Service: Endoscopy;  Laterality: N/A;   HERNIA REPAIR     inguinal   HOT HEMOSTASIS N/A 01/18/2013   Procedure: HOT HEMOSTASIS (ARGON PLASMA COAGULATION/BICAP);  Surgeon: Arta Silence, MD;  Location: [facility];  Service: Endoscopy;  Laterality: N/A;   INCISION AND DRAINAGE HIP Right 12/05/2020   Procedure: IRRIGATION AND  DEBRIDEMENT RIGHT HIP;  Surgeon: Leandrew Koyanagi, MD;  Location: [place];  Service: Orthopedics;  Laterality: Right;   LUNG SURGERY Left 01/29/2009   left lower lobectomy    TOTAL HIP ARTHROPLASTY Right 09/16/2020   Procedure: RIGHT TOTAL HIP ARTHROPLASTY ANTERIOR APPROACH;  Surgeon: Leandrew Koyanagi, MD;  Location: [place];  Service: Orthopedics;  Laterality: Right;   Family History  Problem Relation Age of Onset   Hypertension Mother    Lung cancer Father     Social History   Tobacco Use   Smoking status: Former    Packs/day: 2.00    Years: 20.00    Total pack years: 40.00    Types: Cigarettes    Quit date: 10/19/2008    Years since quitting: 13.7   Smokeless tobacco: Never  Substance Use Topics   Alcohol use: Not Currently   Marital Status: Married  ROS  Review of Systems  Cardiovascular:  Positive for claudication (stable) and dyspnea on exertion (progressing). Negative for chest pain, leg swelling, near-syncope, orthopnea, palpitations, paroxysmal nocturnal dyspnea and syncope.  Respiratory:  Negative for shortness of breath.   Neurological:  Negative for dizziness.   Objective  Blood pressure 138/67, pulse (!) 59, temperature 98.3 F (36.8 C), temperature source Temporal, resp. rate 16, height 6' (1.829 m), weight 293 lb (132.9 kg), SpO2 95 %.     07/02/2022    1:54 PM 04/14/2022   10:49 AM  12/26/2021    9:24 AM  Vitals with BMI  Height 6\' 0"  6\' 0"  6\' 0"   Weight 293 lbs 296 lbs 3 oz 293 lbs  BMI 39.73 22.02 54.27  Systolic 062 376 283  Diastolic 67 67 65  Pulse 59 61 60     Physical Exam Vitals reviewed.  Constitutional:      Appearance: He is well-developed. He is obese.     Comments: Morbidly obese  Cardiovascular:     Rate and Rhythm: Normal rate and regular rhythm.     Pulses: Intact distal pulses.          Femoral pulses are 2+ on the right side and 2+ on the left side.      Popliteal pulses are 0 on the right side and 0 on the left side.       Dorsalis pedis  pulses are 0 on the right side and 0 on the left side.       Posterior tibial pulses are 0 on the right side and 2+ on the left side.     Heart sounds: Normal heart sounds. No murmur heard.    No gallop.     Comments: No JVD. Pulmonary:     Effort: Pulmonary effort is normal. No accessory muscle usage or respiratory distress.     Breath sounds: Normal breath sounds.  Abdominal:     Hernia: A hernia is present. Hernia is present in the ventral area (reducible).  Musculoskeletal:     Right lower leg: No edema.     Left lower leg: No edema.   Physical exam unchanged compared to previous office visit.  Laboratory examination:   Lab Results  Component Value Date   NA 137 12/06/2020   K 4.4 12/06/2020   CO2 22 12/06/2020   GLUCOSE 101 (H) 12/06/2020   BUN 10 12/06/2020   CREATININE 0.92 12/06/2020   CALCIUM 8.8 (L) 12/06/2020   GFRNONAA >60 12/06/2020       Latest Ref Rng & Units 12/06/2020    1:08 AM 11/14/2020    5:42 AM 11/13/2020    1:07 PM  CMP  Glucose 70 - 99 mg/dL 101  133  97   BUN 8 - 23 mg/dL 10  15  14    Creatinine 0.61 - 1.24 mg/dL 0.92  1.11  1.04   Sodium 135 - 145 mmol/L 137  137  139   Potassium 3.5 - 5.1 mmol/L 4.4  5.0  4.8   Chloride 98 - 111 mmol/L 107  106  105   CO2 22 - 32 mmol/L 22  25  26    Calcium 8.9 - 10.3 mg/dL 8.8  9.2  9.7       Latest Ref Rng & Units 12/06/2020    1:08 AM 11/14/2020    5:42 AM 11/13/2020    2:41 PM  CBC  WBC 4.0 - 10.5 K/uL 5.0  7.6  7.5   Hemoglobin 13.0 - 17.0 g/dL 10.1  10.5  13.9   Hematocrit 39.0 - 52.0 % 33.2  33.9  42.5   Platelets 150 - 400 K/uL 262  300  402    Lipid Panel Recent Labs    12/15/21 1239  CHOL 106  TRIG 151*  LDLCALC 46  HDL 34*    TSH No results for input(s): "TSH" in the last 8760 hours.  External labs:   Labs 06/04/2022:  Iron levels markedly reduced.  B12 and folate normal.  Hb 12.6/HCT 39.0, platelets  305, microcytic indicis.  Labs 12/24/2020:   Total cholesterol 218,  triglycerides 151, HDL 38, LDL 140.   Allergies   Allergies  Allergen Reactions   Lisinopril Other (See Comments)    Hyperkalemia   Zolpidem Tartrate Other (See Comments)    Felt fuzzy and hallucinations   Final Medications at End of Visit    Current Outpatient Medications:    amLODipine (NORVASC) 10 MG tablet, Take 1 tablet (10 mg total) by mouth daily., Disp: 90 tablet, Rfl: 1   aspirin EC 81 MG tablet, Take 81 mg by mouth daily. Swallow whole., Disp: , Rfl:    Cholecalciferol (VITAMIN D3) 25 MCG (1000 UT) CAPS, Take 1 capsule by mouth daily at 12 noon., Disp: , Rfl:    clopidogrel (PLAVIX) 75 MG tablet, Take 1 tablet (75 mg total) by mouth daily before breakfast., Disp: 1 tablet, Rfl: 0   ezetimibe (ZETIA) 10 MG tablet, Take 1 tablet (10 mg total) by mouth daily after supper., Disp: 90 tablet, Rfl: 3   ferrous sulfate 325 (65 FE) MG tablet, Take 1 tablet by mouth daily at 12 noon., Disp: , Rfl:    hydrALAZINE (APRESOLINE) 50 MG tablet, Take 1 tablet (50 mg total) by mouth 3 (three) times daily., Disp: 270 tablet, Rfl: 3   metoprolol (LOPRESSOR) 50 MG tablet, Take 50 mg by mouth 2 (two) times daily. Hold if systolic blood pressure (top blood pressure number) less than 100 mmHg or heart rate less than 60 bpm (pulse)., Disp: , Rfl:    omeprazole (PRILOSEC) 20 MG capsule, Take 20 mg by mouth daily., Disp: , Rfl:    Polyethylene Glycol 400 0.25 % GEL, Apply 1-2 drops to eye daily., Disp: , Rfl:    rosuvastatin (CRESTOR) 40 MG tablet, TAKE 1 TABLET BY MOUTH EVERYDAY AT BEDTIME, Disp: 90 tablet, Rfl: 1   tiotropium (SPIRIVA) 18 MCG inhalation capsule, Place 18 mcg into inhaler and inhale daily., Disp: , Rfl:    vitamin B-12 (CYANOCOBALAMIN) 500 MCG tablet, Take 500 mcg by mouth daily., Disp: , Rfl:    fluorouracil (EFUDEX) 5 % cream, APPLY SUFFICIENT AMOUNT TO AFFECTED AREA TWICE A DAY TO THE ARMS AND HANDS TWICE DAILY FOR 21 DAYS THEN DO THE SIDES OF  THE FACE TWICE DAILY FOR 10 DAYS TO THE  ARMS AND HANDS TWICE DAILY FOR 21 DAYS THEN DO THE SIDES OF   THE FACE TWICE DAILY FOR 10 DAYS, Disp: , Rfl:    Radiology:   CT scan of the chest 07/01/2022: Mild emphysema and scattered pulmonary nodules.  Cardiac Studies:   Coronary bypass grafting surgery in 2011: LIMA to the LAD, SVG to diagonal branch, free radial artery to OM branch.  Lower extremity arterial duplex 11/05/2019:  Abnormal ABIs bilaterally suggestive of peripheral vascular disease.   Moderately abnormal pulse volume recordings at the level of bilateral ankles. Arterial duplex (right) demonstrates multiphasic waveform up to the popliteal artery.  Posterior tibial artery and anterior tibial artery noted to have monophasic waveform. Arterial duplex (left) demonstrates multiphasic waveform except at the level of mid SFA.  Mid SFA demonstrates a monophasic waveform with near occlusion and diffuse plaque. Conclusions: This exam reveals moderately decreased perfusion of the right lower extremity, noted at the post tibial artery level (ABI 0.67) and moderately decreased perfusion of the left lower extremity, noted at the post tibial artery level (ABI 0.73).  PCV ECHOCARDIOGRAM COMPLETE 05/28/2022  Normal LV systolic function with visual EF 55-60%. Left ventricle cavity is  normal in size. Moderate concentric hypertrophy of the left ventricle. Normal global wall motion. Doppler evidence of grade I (impaired) diastolic dysfunction, elevated LAP. Calculated EF 55%. Left atrial cavity is moderately dilated at 5.8 cm. A lipomatous septum is present. Structurally normal tricuspid valve with trace regurgitation. No evidence of pulmonary hypertension.    PCV MYOCARDIAL PERFUSION WITH LEXISCAN 06/08/2022  Narrative Lexiscan (with Mod Bruce protocol) Nuclear stress test 06/08/22 Myocardial perfusion is abnormal. High risk study. There is a fixed defect with no uptake in the apical cap in the apical region. There is a reversible severe  defect in the anterior, lateral and inferior regions. Overall LV systolic function is abnormal with decreased thickening in the apex. Stress LV EF: 67% however visually appears lower. TID ratio 1.26, which is abnormal. Nondiagnostic ECG stress. The heart rate response was consistent with Regadenoson. The blood pressure response was physiologic. Compared to 12/20/2017, there was apical scar and small mild basal to mid inferior ischemia, EF 59%.       EKG  04/14/2022: Sinus rhythm at a rate of 56 bpm.  Normal axis.  Right bundle branch block.  LVH with secondary repolarization abnormality, cannot exclude inferior and anterior lateral ischemia.  Compared EKG 10/31/2021, no significant change.  Assessment     ICD-10-CM   1. Atherosclerosis of native coronary artery of native heart without angina pectoris  L27.51 Basic metabolic panel    CBC    2. Essential hypertension  I10     3. Mixed hyperlipidemia  E78.2     4. Peripheral artery disease (HCC)  I73.9       No orders of the defined types were placed in this encounter.    Medications Discontinued During This Encounter  Medication Reason   Vitamin D, Ergocalciferol, (DRISDOL) 50000 UNITS CAPS     Recommendations:   CORDARRYL MONRREAL is a 66 y.o. with CAD SP CABG in 2001, obesity, OSA on CPAP, mixed hyperlipidemia, hypertension, former smoker, history of lung cancer status post chemotherapy and left lower lobectomy, PAD and symptoms of claudication involving his right calf worse than the left calf.   This is a 48-month office visit for follow-up of coronary disease and hypertension.  He was scheduled for nuclear stress test due to dyspnea on exertion.  Review of the stress test reveals new severe anterior ischemia.  However his LVEF is preserved anticomplement of soft tissue admission cannot be excluded.  Overall a high risk nuclear stress test.  He needs cardiac catheterization to evaluate his coronary artery.  I reviewed his recent visit  from [facility], he has had guaiac positive stool and has been referred for [facility] for further evaluation.  I will schedule him for diagnostic coronary and graft angiography to exclude proximal LAD/LIMA disease or any other high risk lesions.  However I would like to wait for GI evaluation to be completed prior to stenting.  Presently on dual antiplatelet therapy with aspirin and Plavix which I will continue for now, he could certainly discontinue Plavix 5 days before any GI evaluation.  Blood pressure is now well controlled.  No clinical evidence of heart failure.  With regard to peripheral artery disease, mild symptoms, no change in his physical exam and no limb threatening ischemia.  I would like to see him back in 2 months for follow-up, however this date could change depending upon coronary angiography findings and also GI evaluation.  He is also developing mild cognitive dysfunction, he is being evaluated by  neurology.  B12 and folate levels normal.  He does have severe iron deficiency with very mild anemia.  Obesity discussed with the patient, lifestyle modification discussed with the patient.  Wife present and all questions answered.   Adrian Prows, PA-C 07/02/2022, 3:16 PM Office: [phone]

## 2022-07-02 NOTE — Progress Notes (Signed)
Primary Physician/Referring:  [name], MD  Patient ID: Sean Gardner, male    DOB: 1956/09/20, 66 y.o.   MRN: [id number]  Chief Complaint  Patient presents with   Results   Follow-up   HPI:    Sean Gardner is a 66 y.o. with CAD SP CABG in 2001, obesity, OSA on CPAP, mixed hyperlipidemia, hypertension, former smoker, history of lung cancer status post chemotherapy and left lower lobectomy, PAD and symptoms of claudication involving his right calf worse than the left calf.   This is a 14-month office visit for follow-up of coronary disease and hypertension.  He was scheduled for nuclear stress test due to dyspnea on exertion.  No change in dyspnea, denies PND or orthopnea.  No leg edema.  He is tolerating current medications without issue.  Past Medical History:  Diagnosis Date   Claudication in peripheral vascular disease ([place])    Hyperlipidemia    Hypertension    Lung cancer ([place])    neuroendocrine lung ca dx 2010   Myocardial infarction [facility]) 07/2000   Peripheral vascular disease (HCC)    Shortness of breath    on exertion,can't lay on his back   Sleep apnea    cpap   Past Surgical History:  Procedure Laterality Date   ACHILLES TENDON SURGERY     ANTERIOR HIP REVISION Right 11/13/2020   Procedure: IRRIGATION AND DEBRIDEMENT RIGHT HIP;  Surgeon: Leandrew Koyanagi, MD;  Location: [place];  Service: Orthopedics;  Laterality: Right;   CORONARY ARTERY BYPASS GRAFT  08/18/2000   LIMA--dLIMA, RIMA-dRCA, left RA-OM2, SVG-D2   FLEXIBLE SIGMOIDOSCOPY N/A 01/18/2013   Procedure: FLEXIBLE SIGMOIDOSCOPY;  Surgeon: Arta Silence, MD;  Location: [facility];  Service: Endoscopy;  Laterality: N/A;   HERNIA REPAIR     inguinal   HOT HEMOSTASIS N/A 01/18/2013   Procedure: HOT HEMOSTASIS (ARGON PLASMA COAGULATION/BICAP);  Surgeon: Arta Silence, MD;  Location: [facility];  Service: Endoscopy;  Laterality: N/A;   INCISION AND DRAINAGE HIP Right 12/05/2020   Procedure: IRRIGATION AND  DEBRIDEMENT RIGHT HIP;  Surgeon: Leandrew Koyanagi, MD;  Location: [place];  Service: Orthopedics;  Laterality: Right;   LUNG SURGERY Left 01/29/2009   left lower lobectomy    TOTAL HIP ARTHROPLASTY Right 09/16/2020   Procedure: RIGHT TOTAL HIP ARTHROPLASTY ANTERIOR APPROACH;  Surgeon: Leandrew Koyanagi, MD;  Location: [place];  Service: Orthopedics;  Laterality: Right;   Family History  Problem Relation Age of Onset   Hypertension Mother    Lung cancer Father     Social History   Tobacco Use   Smoking status: Former    Packs/day: 2.00    Years: 20.00    Total pack years: 40.00    Types: Cigarettes    Quit date: 10/19/2008    Years since quitting: 13.7   Smokeless tobacco: Never  Substance Use Topics   Alcohol use: Not Currently   Marital Status: Married  ROS  Review of Systems  Cardiovascular:  Positive for claudication (stable) and dyspnea on exertion (progressing). Negative for chest pain, leg swelling, near-syncope, orthopnea, palpitations, paroxysmal nocturnal dyspnea and syncope.  Respiratory:  Negative for shortness of breath.   Neurological:  Negative for dizziness.   Objective  Blood pressure 138/67, pulse (!) 59, temperature 98.3 F (36.8 C), temperature source Temporal, resp. rate 16, height 6' (1.829 m), weight 293 lb (132.9 kg), SpO2 95 %.     07/02/2022    1:54 PM 04/14/2022   10:49 AM  12/26/2021    9:24 AM  Vitals with BMI  Height 6\' 0"  6\' 0"  6\' 0"   Weight 293 lbs 296 lbs 3 oz 293 lbs  BMI 39.73 99.37 16.96  Systolic 789 381 017  Diastolic 67 67 65  Pulse 59 61 60     Physical Exam Vitals reviewed.  Constitutional:      Appearance: He is well-developed. He is obese.     Comments: Morbidly obese  Cardiovascular:     Rate and Rhythm: Normal rate and regular rhythm.     Pulses: Intact distal pulses.          Femoral pulses are 2+ on the right side and 2+ on the left side.      Popliteal pulses are 0 on the right side and 0 on the left side.       Dorsalis pedis  pulses are 0 on the right side and 0 on the left side.       Posterior tibial pulses are 0 on the right side and 2+ on the left side.     Heart sounds: Normal heart sounds. No murmur heard.    No gallop.     Comments: No JVD. Pulmonary:     Effort: Pulmonary effort is normal. No accessory muscle usage or respiratory distress.     Breath sounds: Normal breath sounds.  Abdominal:     Hernia: A hernia is present. Hernia is present in the ventral area (reducible).  Musculoskeletal:     Right lower leg: No edema.     Left lower leg: No edema.   Physical exam unchanged compared to previous office visit.  Laboratory examination:   Lab Results  Component Value Date   NA 137 12/06/2020   K 4.4 12/06/2020   CO2 22 12/06/2020   GLUCOSE 101 (H) 12/06/2020   BUN 10 12/06/2020   CREATININE 0.92 12/06/2020   CALCIUM 8.8 (L) 12/06/2020   GFRNONAA >60 12/06/2020       Latest Ref Rng & Units 12/06/2020    1:08 AM 11/14/2020    5:42 AM 11/13/2020    1:07 PM  CMP  Glucose 70 - 99 mg/dL 101  133  97   BUN 8 - 23 mg/dL 10  15  14    Creatinine 0.61 - 1.24 mg/dL 0.92  1.11  1.04   Sodium 135 - 145 mmol/L 137  137  139   Potassium 3.5 - 5.1 mmol/L 4.4  5.0  4.8   Chloride 98 - 111 mmol/L 107  106  105   CO2 22 - 32 mmol/L 22  25  26    Calcium 8.9 - 10.3 mg/dL 8.8  9.2  9.7       Latest Ref Rng & Units 12/06/2020    1:08 AM 11/14/2020    5:42 AM 11/13/2020    2:41 PM  CBC  WBC 4.0 - 10.5 K/uL 5.0  7.6  7.5   Hemoglobin 13.0 - 17.0 g/dL 10.1  10.5  13.9   Hematocrit 39.0 - 52.0 % 33.2  33.9  42.5   Platelets 150 - 400 K/uL 262  300  402    Lipid Panel Recent Labs    12/15/21 1239  CHOL 106  TRIG 151*  LDLCALC 46  HDL 34*    TSH No results for input(s): "TSH" in the last 8760 hours.  External labs:   Labs 06/04/2022:  Iron levels markedly reduced.  B12 and folate normal.  Hb 12.6/HCT 39.0, platelets  305, microcytic indicis.  Labs 12/24/2020:   Total cholesterol 218,  triglycerides 151, HDL 38, LDL 140.   Allergies   Allergies  Allergen Reactions   Lisinopril Other (See Comments)    Hyperkalemia   Zolpidem Tartrate Other (See Comments)    Felt fuzzy and hallucinations   Final Medications at End of Visit    Current Outpatient Medications:    amLODipine (NORVASC) 10 MG tablet, Take 1 tablet (10 mg total) by mouth daily., Disp: 90 tablet, Rfl: 1   aspirin EC 81 MG tablet, Take 81 mg by mouth daily. Swallow whole., Disp: , Rfl:    Cholecalciferol (VITAMIN D3) 25 MCG (1000 UT) CAPS, Take 1 capsule by mouth daily at 12 noon., Disp: , Rfl:    clopidogrel (PLAVIX) 75 MG tablet, Take 1 tablet (75 mg total) by mouth daily before breakfast., Disp: 1 tablet, Rfl: 0   ezetimibe (ZETIA) 10 MG tablet, Take 1 tablet (10 mg total) by mouth daily after supper., Disp: 90 tablet, Rfl: 3   ferrous sulfate 325 (65 FE) MG tablet, Take 1 tablet by mouth daily at 12 noon., Disp: , Rfl:    hydrALAZINE (APRESOLINE) 50 MG tablet, Take 1 tablet (50 mg total) by mouth 3 (three) times daily., Disp: 270 tablet, Rfl: 3   metoprolol (LOPRESSOR) 50 MG tablet, Take 50 mg by mouth 2 (two) times daily. Hold if systolic blood pressure (top blood pressure number) less than 100 mmHg or heart rate less than 60 bpm (pulse)., Disp: , Rfl:    omeprazole (PRILOSEC) 20 MG capsule, Take 20 mg by mouth daily., Disp: , Rfl:    Polyethylene Glycol 400 0.25 % GEL, Apply 1-2 drops to eye daily., Disp: , Rfl:    rosuvastatin (CRESTOR) 40 MG tablet, TAKE 1 TABLET BY MOUTH EVERYDAY AT BEDTIME, Disp: 90 tablet, Rfl: 1   tiotropium (SPIRIVA) 18 MCG inhalation capsule, Place 18 mcg into inhaler and inhale daily., Disp: , Rfl:    vitamin B-12 (CYANOCOBALAMIN) 500 MCG tablet, Take 500 mcg by mouth daily., Disp: , Rfl:    fluorouracil (EFUDEX) 5 % cream, APPLY SUFFICIENT AMOUNT TO AFFECTED AREA TWICE A DAY TO THE ARMS AND HANDS TWICE DAILY FOR 21 DAYS THEN DO THE SIDES OF  THE FACE TWICE DAILY FOR 10 DAYS TO THE  ARMS AND HANDS TWICE DAILY FOR 21 DAYS THEN DO THE SIDES OF   THE FACE TWICE DAILY FOR 10 DAYS, Disp: , Rfl:    Radiology:   CT scan of the chest 07/01/2022: Mild emphysema and scattered pulmonary nodules.  Cardiac Studies:   Coronary bypass grafting surgery in 2011: LIMA to the LAD, SVG to diagonal branch, free radial artery to OM branch.  Lower extremity arterial duplex 11/05/2019:  Abnormal ABIs bilaterally suggestive of peripheral vascular disease.   Moderately abnormal pulse volume recordings at the level of bilateral ankles. Arterial duplex (right) demonstrates multiphasic waveform up to the popliteal artery.  Posterior tibial artery and anterior tibial artery noted to have monophasic waveform. Arterial duplex (left) demonstrates multiphasic waveform except at the level of mid SFA.  Mid SFA demonstrates a monophasic waveform with near occlusion and diffuse plaque. Conclusions: This exam reveals moderately decreased perfusion of the right lower extremity, noted at the post tibial artery level (ABI 0.67) and moderately decreased perfusion of the left lower extremity, noted at the post tibial artery level (ABI 0.73).  PCV ECHOCARDIOGRAM COMPLETE 05/28/2022  Normal LV systolic function with visual EF 55-60%. Left ventricle cavity is  normal in size. Moderate concentric hypertrophy of the left ventricle. Normal global wall motion. Doppler evidence of grade I (impaired) diastolic dysfunction, elevated LAP. Calculated EF 55%. Left atrial cavity is moderately dilated at 5.8 cm. A lipomatous septum is present. Structurally normal tricuspid valve with trace regurgitation. No evidence of pulmonary hypertension.    PCV MYOCARDIAL PERFUSION WITH LEXISCAN 06/08/2022  Narrative Lexiscan (with Mod Bruce protocol) Nuclear stress test 06/08/22 Myocardial perfusion is abnormal. High risk study. There is a fixed defect with no uptake in the apical cap in the apical region. There is a reversible severe  defect in the anterior, lateral and inferior regions. Overall LV systolic function is abnormal with decreased thickening in the apex. Stress LV EF: 67% however visually appears lower. TID ratio 1.26, which is abnormal. Nondiagnostic ECG stress. The heart rate response was consistent with Regadenoson. The blood pressure response was physiologic. Compared to 12/20/2017, there was apical scar and small mild basal to mid inferior ischemia, EF 59%.       EKG  04/14/2022: Sinus rhythm at a rate of 56 bpm.  Normal axis.  Right bundle branch block.  LVH with secondary repolarization abnormality, cannot exclude inferior and anterior lateral ischemia.  Compared EKG 10/31/2021, no significant change.  Assessment     ICD-10-CM   1. Atherosclerosis of native coronary artery of native heart without angina pectoris  D35.70 Basic metabolic panel    CBC    2. Essential hypertension  I10     3. Mixed hyperlipidemia  E78.2     4. Peripheral artery disease (HCC)  I73.9       No orders of the defined types were placed in this encounter.    Medications Discontinued During This Encounter  Medication Reason   Vitamin D, Ergocalciferol, (DRISDOL) 50000 UNITS CAPS     Recommendations:   Sean Gardner is a 66 y.o. with CAD SP CABG in 2001, obesity, OSA on CPAP, mixed hyperlipidemia, hypertension, former smoker, history of lung cancer status post chemotherapy and left lower lobectomy, PAD and symptoms of claudication involving his right calf worse than the left calf.   This is a 57-month office visit for follow-up of coronary disease and hypertension.  He was scheduled for nuclear stress test due to dyspnea on exertion.  Review of the stress test reveals new severe anterior ischemia.  However his LVEF is preserved anticomplement of soft tissue admission cannot be excluded.  Overall a high risk nuclear stress test.  He needs cardiac catheterization to evaluate his coronary artery.  I reviewed his recent visit  from [facility], he has had guaiac positive stool and has been referred for [facility] for further evaluation.  I will schedule him for diagnostic coronary and graft angiography to exclude proximal LAD/LIMA disease or any other high risk lesions.  However I would like to wait for GI evaluation to be completed prior to stenting.  Presently on dual antiplatelet therapy with aspirin and Plavix which I will continue for now, he could certainly discontinue Plavix 5 days before any GI evaluation.  Blood pressure is now well controlled.  No clinical evidence of heart failure.  With regard to peripheral artery disease, mild symptoms, no change in his physical exam and no limb threatening ischemia.  I would like to see him back in 2 months for follow-up, however this date could change depending upon coronary angiography findings and also GI evaluation.  He is also developing mild cognitive dysfunction, he is being evaluated by  neurology.  B12 and folate levels normal.  He does have severe iron deficiency with very mild anemia.  Obesity discussed with the patient, lifestyle modification discussed with the patient.  Wife present and all questions answered.   Adrian Prows, PA-C 07/02/2022, 3:16 PM Office: [phone]

## 2022-07-03 ENCOUNTER — Telehealth: Payer: Self-pay | Admitting: Gastroenterology

## 2022-07-03 LAB — CBC
Hematocrit: 40.4 % (ref 37.5–51.0)
Hemoglobin: 12.7 g/dL — ABNORMAL LOW (ref 13.0–17.7)
MCH: 26.7 pg (ref 26.6–33.0)
MCHC: 31.4 g/dL — ABNORMAL LOW (ref 31.5–35.7)
MCV: 85 fL (ref 79–97)
Platelets: 287 10*3/uL (ref 150–450)
RBC: 4.76 x10E6/uL (ref 4.14–5.80)
RDW: 16.8 % — ABNORMAL HIGH (ref 11.6–15.4)
WBC: 8.3 10*3/uL (ref 3.4–10.8)

## 2022-07-03 LAB — BASIC METABOLIC PANEL
BUN/Creatinine Ratio: 15 (ref 10–24)
BUN: 14 mg/dL (ref 8–27)
CO2: 23 mmol/L (ref 20–29)
Calcium: 10.2 mg/dL (ref 8.6–10.2)
Chloride: 104 mmol/L (ref 96–106)
Creatinine, Ser: 0.95 mg/dL (ref 0.76–1.27)
Glucose: 86 mg/dL (ref 70–99)
Potassium: 4.9 mmol/L (ref 3.5–5.2)
Sodium: 141 mmol/L (ref 134–144)
eGFR: 87 mL/min/{1.73_m2} (ref >59)

## 2022-07-03 NOTE — Telephone Encounter (Signed)
Good Morning Dr. Loletha Carrow,  Supervising MD 8/30 AM  We received a referral from the [place] for Mr. [place]. He has GI history at [facility]. He is requesting a transfer of care due to insurance.  We have records for review, please advise on scheduling.  Thank you.

## 2022-07-07 NOTE — Telephone Encounter (Signed)
Lauren,  I will see him at my next available new patient appointment.  - HD   _____________________________  Dr. Einar Gip,  I read your recent office note and see that you are planning a cardiac catheterization for this patient's symptoms and abnormal nuclear stress test.  With that, I also see your concerns about his reported heme positive stool, particularly as regards the plan for possible coronary stent placement if needed.  I have not yet evaluated this patient as he is new to our practice, and I most likely do not have a new patient appointment for several weeks.  In addition, I have not yet been given any records from the [place] for review.  Thus it is difficult to make full recommendations regarding his care.  However, such patients are at clearly at high risk for cardiac complications of anesthesia for endoscopic procedures, and are therefore generally best served by having their cardiac catheterization and any necessary coronary stent placement attended to prior to their endoscopic procedures.  Wilfrid Lund, [facility]

## 2022-07-07 NOTE — Telephone Encounter (Signed)
Hi Sean Gardner,  I will go ahead and set him up for a diagnostic cath unless I see something very critical, I will not intervene. Thanks so much for your minute details on the patient care. I so much appreciate it.  [name]

## 2022-07-08 ENCOUNTER — Encounter: Payer: Self-pay | Admitting: Gastroenterology

## 2022-07-08 NOTE — Telephone Encounter (Signed)
Patient has been scheduled with Dr. Loletha Carrow 11/1.

## 2022-07-14 ENCOUNTER — Encounter (HOSPITAL_COMMUNITY): Admission: RE | Payer: Self-pay | Source: Home / Self Care

## 2022-07-14 ENCOUNTER — Ambulatory Visit (HOSPITAL_COMMUNITY): Admission: RE | Admit: 2022-07-14 | Payer: Medicare Other | Source: Home / Self Care | Admitting: Cardiology

## 2022-07-14 SURGERY — LEFT HEART CATH AND CORS/GRAFTS ANGIOGRAPHY
Anesthesia: LOCAL

## 2022-07-21 ENCOUNTER — Ambulatory Visit (HOSPITAL_COMMUNITY)
Admission: RE | Disposition: A | Discharge status: Home / Self Care | Payer: Self-pay | Source: Home / Self Care | Attending: Cardiology

## 2022-07-21 ENCOUNTER — Ambulatory Visit (HOSPITAL_COMMUNITY)
Admission: RE | Admit: 2022-07-21 | Discharge: 2022-07-21 | Disposition: A | Discharge status: Home / Self Care | Payer: Medicare Other | Attending: Cardiology | Admitting: Cardiology

## 2022-07-21 ENCOUNTER — Other Ambulatory Visit: Payer: Self-pay

## 2022-07-21 DIAGNOSIS — Z9221 Personal history of antineoplastic chemotherapy: Secondary | ICD-10-CM | POA: Insufficient documentation

## 2022-07-21 DIAGNOSIS — I25810 Atherosclerosis of coronary artery bypass graft(s) without angina pectoris: Secondary | ICD-10-CM | POA: Insufficient documentation

## 2022-07-21 DIAGNOSIS — I739 Peripheral vascular disease, unspecified: Secondary | ICD-10-CM | POA: Insufficient documentation

## 2022-07-21 DIAGNOSIS — G4733 Obstructive sleep apnea (adult) (pediatric): Secondary | ICD-10-CM | POA: Insufficient documentation

## 2022-07-21 DIAGNOSIS — I10 Essential (primary) hypertension: Secondary | ICD-10-CM | POA: Insufficient documentation

## 2022-07-21 DIAGNOSIS — Z7902 Long term (current) use of antithrombotics/antiplatelets: Secondary | ICD-10-CM | POA: Insufficient documentation

## 2022-07-21 DIAGNOSIS — Z85118 Personal history of other malignant neoplasm of bronchus and lung: Secondary | ICD-10-CM | POA: Diagnosis not present

## 2022-07-21 DIAGNOSIS — Z6839 Body mass index (BMI) 39.0-39.9, adult: Secondary | ICD-10-CM | POA: Insufficient documentation

## 2022-07-21 DIAGNOSIS — E782 Mixed hyperlipidemia: Secondary | ICD-10-CM | POA: Insufficient documentation

## 2022-07-21 DIAGNOSIS — Z951 Presence of aortocoronary bypass graft: Secondary | ICD-10-CM

## 2022-07-21 DIAGNOSIS — Z87891 Personal history of nicotine dependence: Secondary | ICD-10-CM | POA: Diagnosis not present

## 2022-07-21 DIAGNOSIS — R9439 Abnormal result of other cardiovascular function study: Secondary | ICD-10-CM

## 2022-07-21 DIAGNOSIS — I2510 Atherosclerotic heart disease of native coronary artery without angina pectoris: Secondary | ICD-10-CM | POA: Diagnosis present

## 2022-07-21 HISTORY — PX: LEFT HEART CATH AND CORS/GRAFTS ANGIOGRAPHY: CATH118250

## 2022-07-21 SURGERY — LEFT HEART CATH AND CORS/GRAFTS ANGIOGRAPHY
Anesthesia: LOCAL

## 2022-07-21 MED ORDER — LIDOCAINE HCL (PF) 1 % IJ SOLN
INTRAMUSCULAR | Status: DC | PRN
  Administered 2022-07-21: 2 mL

## 2022-07-21 MED ORDER — SODIUM CHLORIDE 0.9% FLUSH
3.0000 mL | INTRAVENOUS | Status: DC | PRN
Start: 2022-07-21 — End: 2022-07-21
  Administered 2022-07-21: 3 mL via INTRAVENOUS

## 2022-07-21 MED ORDER — SODIUM CHLORIDE 0.9 % WEIGHT BASED INFUSION: 1.0000 mL/kg/h | INTRAVENOUS | Status: DC

## 2022-07-21 MED ORDER — SODIUM CHLORIDE 0.9 % IV SOLN: 250.0000 mL | INTRAVENOUS | Status: DC | PRN

## 2022-07-21 MED ORDER — ACETAMINOPHEN 325 MG PO TABS: 650.0000 mg | ORAL_TABLET | ORAL | Status: DC | PRN

## 2022-07-21 MED ORDER — ONDANSETRON HCL 4 MG/2ML IJ SOLN: 4.0000 mg | Freq: Four times a day (QID) | INTRAMUSCULAR | Status: DC | PRN

## 2022-07-21 MED ORDER — CLOPIDOGREL BISULFATE 75 MG PO TABS
75.0000 mg | ORAL_TABLET | Freq: Once | ORAL | Status: AC
  Administered 2022-07-21: 75 mg via ORAL
  Filled 2022-07-21: qty 1

## 2022-07-21 MED ORDER — MIDAZOLAM HCL 2 MG/2ML IJ SOLN
INTRAMUSCULAR | Status: AC
  Filled 2022-07-21: qty 2

## 2022-07-21 MED ORDER — HEPARIN SODIUM (PORCINE) 1000 UNIT/ML IJ SOLN
INTRAMUSCULAR | Status: AC
  Filled 2022-07-21: qty 10

## 2022-07-21 MED ORDER — ASPIRIN 81 MG PO CHEW
81.0000 mg | CHEWABLE_TABLET | ORAL | Status: AC
  Administered 2022-07-21: 81 mg via ORAL
  Filled 2022-07-21: qty 1

## 2022-07-21 MED ORDER — SODIUM CHLORIDE 0.9% FLUSH: 3.0000 mL | INTRAVENOUS | Status: DC | PRN

## 2022-07-21 MED ORDER — VERAPAMIL HCL 2.5 MG/ML IV SOLN
INTRAVENOUS | Status: DC | PRN
  Administered 2022-07-21: 10 mL via INTRA_ARTERIAL

## 2022-07-21 MED ORDER — SODIUM CHLORIDE 0.9% FLUSH: 3.0000 mL | Freq: Two times a day (BID) | INTRAVENOUS | Status: DC

## 2022-07-21 MED ORDER — FENTANYL CITRATE (PF) 100 MCG/2ML IJ SOLN
INTRAMUSCULAR | Status: DC | PRN
  Administered 2022-07-21 (×2): 25 ug via INTRAVENOUS
  Administered 2022-07-21: 50 ug via INTRAVENOUS

## 2022-07-21 MED ORDER — IOHEXOL 350 MG/ML SOLN
INTRAVENOUS | Status: DC | PRN
  Administered 2022-07-21: 95 mL

## 2022-07-21 MED ORDER — VERAPAMIL HCL 2.5 MG/ML IV SOLN
INTRAVENOUS | Status: AC
  Filled 2022-07-21: qty 2

## 2022-07-21 MED ORDER — HEPARIN (PORCINE) IN NACL 1000-0.9 UT/500ML-% IV SOLN
INTRAVENOUS | Status: DC | PRN
  Administered 2022-07-21 (×2): 500 mL

## 2022-07-21 MED ORDER — SODIUM CHLORIDE 0.9 % WEIGHT BASED INFUSION
3.0000 mL/kg/h | INTRAVENOUS | Status: AC
  Administered 2022-07-21: 3 mL/kg/h via INTRAVENOUS

## 2022-07-21 MED ORDER — HEPARIN (PORCINE) IN NACL 1000-0.9 UT/500ML-% IV SOLN
INTRAVENOUS | Status: AC
  Filled 2022-07-21: qty 1000

## 2022-07-21 MED ORDER — LIDOCAINE HCL (PF) 1 % IJ SOLN
INTRAMUSCULAR | Status: AC
  Filled 2022-07-21: qty 30

## 2022-07-21 MED ORDER — FENTANYL CITRATE (PF) 100 MCG/2ML IJ SOLN
INTRAMUSCULAR | Status: AC
  Filled 2022-07-21: qty 2

## 2022-07-21 MED ORDER — MIDAZOLAM HCL 2 MG/2ML IJ SOLN
INTRAMUSCULAR | Status: DC | PRN
  Administered 2022-07-21: 2 mg via INTRAVENOUS

## 2022-07-21 SURGICAL SUPPLY — 17 items
CATH INFINITI 5 FR IM (CATHETERS) IMPLANT
CATH INFINITI 5FR MPB2 (CATHETERS) IMPLANT
CLOSURE PERCLOSE PROSTYLE (VASCULAR PRODUCTS) IMPLANT
GLIDESHEATH SLEND A-KIT 6F 22G (SHEATH) IMPLANT
GUIDEWIRE ANGLED .035X150CM (WIRE) IMPLANT
GUIDEWIRE INQWIRE 1.5J.035X260 (WIRE) IMPLANT
INQWIRE 1.5J .035X260CM (WIRE)
KIT HEART LEFT (KITS) ×1 IMPLANT
KIT MICROPUNCTURE NIT STIFF (SHEATH) IMPLANT
PACK CARDIAC CATHETERIZATION (CUSTOM PROCEDURE TRAY) ×1 IMPLANT
SHEATH PINNACLE 5F 10CM (SHEATH) IMPLANT
SHEATH PROBE COVER 6X72 (BAG) IMPLANT
TRANSDUCER W/STOPCOCK (MISCELLANEOUS) ×1 IMPLANT
TUBING CIL FLEX 10 FLL-RA (TUBING) ×1 IMPLANT
WIRE EMERALD 3MM-J .035X150CM (WIRE) IMPLANT
WIRE EMERALD 3MM-J .035X260CM (WIRE) IMPLANT
WIRE MICROINTRODUCER 60CM (WIRE) IMPLANT

## 2022-07-21 NOTE — Interval H&P Note (Signed)
History and Physical Interval Note:  07/21/2022 10:06 AM  Sean Gardner  has presented today for surgery, with the diagnosis of CAD.  The various methods of treatment have been discussed with the patient and family. After consideration of risks, benefits and other options for treatment, the patient has consented to  Procedure(s): LEFT HEART CATH AND CORS/GRAFTS ANGIOGRAPHY (N/A) as a surgical intervention.  The patient's history has been reviewed, patient examined, no change in status, stable for surgery.  I have reviewed the patient's chart and labs.  Questions were answered to the patient's satisfaction.     Adrian Prows

## 2022-07-21 NOTE — Progress Notes (Signed)
Patient ambulated to BR , right groin CDI.

## 2022-07-22 ENCOUNTER — Encounter (HOSPITAL_COMMUNITY): Payer: Self-pay | Admitting: Cardiology

## 2022-07-22 MED FILL — Heparin Sodium (Porcine) Inj 1000 Unit/ML: INTRAMUSCULAR | Qty: 10 | Status: AC

## 2022-07-23 ENCOUNTER — Ambulatory Visit: Payer: Medicare Other | Admitting: Cardiology

## 2022-07-29 ENCOUNTER — Ambulatory Visit: Payer: Medicare Other | Admitting: Cardiology

## 2022-07-29 ENCOUNTER — Encounter: Payer: Self-pay | Admitting: Cardiology

## 2022-07-29 VITALS — BP 131/61 | HR 67 | Temp 98.2°F | Resp 16 | Ht 72.0 in | Wt 287.2 lb

## 2022-07-29 DIAGNOSIS — I2510 Atherosclerotic heart disease of native coronary artery without angina pectoris: Secondary | ICD-10-CM

## 2022-07-29 DIAGNOSIS — E782 Mixed hyperlipidemia: Secondary | ICD-10-CM

## 2022-07-29 DIAGNOSIS — I10 Essential (primary) hypertension: Secondary | ICD-10-CM

## 2022-07-29 NOTE — Progress Notes (Signed)
Primary Physician/Referring:  [name], MD  Patient ID: [name], male    DOB: 1956-02-08, 66 y.o.   MRN: [id number]  Chief Complaint  Patient presents with   Follow-up   HPI:    Sean Gardner is a 66 y.o. with CAD SP CABG in 2001, obesity, OSA on CPAP, mixed hyperlipidemia, hypertension, former smoker, history of lung cancer status post chemotherapy and left lower lobectomy, PAD and symptoms of claudication involving his right calf worse than the left calf.   Due to worsening dyspnea.  Nuclear stress test was performed which had revealed severe anterior ischemia.  He underwent cardiac catheterization on 07/21/2022 revealing an occluded saphenous vein graft to a moderate-sized D1, probable reason for his ischemia.  No complications from the procedure.  States that he is doing well.  Since he has started losing weight, states that his dyspnea is improved, he has not had any chest pain, overall feels well.  Past Medical History:  Diagnosis Date   Claudication in peripheral vascular disease ([place])    Hyperlipidemia    Hypertension    Lung cancer ([place])    neuroendocrine lung ca dx 2010   Myocardial infarction [facility]) 07/2000   Peripheral vascular disease ([facility])    Shortness of breath    on exertion,can't lay on his back   Sleep apnea    cpap   Past Surgical History:  Procedure Laterality Date   ACHILLES TENDON SURGERY     ANTERIOR HIP REVISION Right 11/13/2020   Procedure: IRRIGATION AND DEBRIDEMENT RIGHT HIP;  Surgeon: Leandrew Koyanagi, MD;  Location: [place];  Service: Orthopedics;  Laterality: Right;   CORONARY ARTERY BYPASS GRAFT  08/18/2000   LIMA--dLIMA, RIMA-dRCA, left RA-OM2, SVG-D2   FLEXIBLE SIGMOIDOSCOPY N/A 01/18/2013   Procedure: FLEXIBLE SIGMOIDOSCOPY;  Surgeon: Arta Silence, MD;  Location: [facility];  Service: Endoscopy;  Laterality: N/A;   HERNIA REPAIR     inguinal   HOT HEMOSTASIS N/A 01/18/2013   Procedure: HOT HEMOSTASIS (ARGON PLASMA COAGULATION/BICAP);   Surgeon: Arta Silence, MD;  Location: [facility];  Service: Endoscopy;  Laterality: N/A;   INCISION AND DRAINAGE HIP Right 12/05/2020   Procedure: IRRIGATION AND DEBRIDEMENT RIGHT HIP;  Surgeon: Leandrew Koyanagi, MD;  Location: [place];  Service: Orthopedics;  Laterality: Right;   LEFT HEART CATH AND CORS/GRAFTS ANGIOGRAPHY N/A 07/21/2022   Procedure: LEFT HEART CATH AND CORS/GRAFTS ANGIOGRAPHY;  Surgeon: Adrian Prows, MD;  Location: [facility];  Service: Cardiovascular;  Laterality: N/A;   LUNG SURGERY Left 01/29/2009   left lower lobectomy    TOTAL HIP ARTHROPLASTY Right 09/16/2020   Procedure: RIGHT TOTAL HIP ARTHROPLASTY ANTERIOR APPROACH;  Surgeon: Leandrew Koyanagi, MD;  Location: [place];  Service: Orthopedics;  Laterality: Right;   Family History  Problem Relation Age of Onset   Hypertension Mother    Lung cancer Father     Social History   Tobacco Use   Smoking status: Former    Packs/day: 2.00    Years: 20.00    Total pack years: 40.00    Types: Cigarettes    Quit date: 10/19/2008    Years since quitting: 13.7   Smokeless tobacco: Never  Substance Use Topics   Alcohol use: Not Currently   Marital Status: Married  ROS  Review of Systems  Cardiovascular:  Positive for claudication (stable) and dyspnea on exertion (progressing). Negative for chest pain, leg swelling and near-syncope.   Objective  Blood pressure 131/61, pulse 67,  temperature 98.2 F (36.8 C), temperature source Temporal, resp. rate 16, height 6' (1.829 m), weight 287 lb 3.2 oz (130.3 kg), SpO2 95 %.     07/29/2022   10:50 AM 07/29/2022   10:49 AM 07/21/2022    1:45 PM  Vitals with BMI  Height  '6\' 0"'    Weight  287 lbs 3 oz   BMI  54.56   Systolic 256 389 373  Diastolic 61 70 56  Pulse 67 61 62     Physical Exam Vitals reviewed.  Constitutional:      Appearance: He is well-developed. He is obese.     Comments: Morbidly obese  Cardiovascular:     Rate and Rhythm: Normal rate and regular rhythm.      Pulses: Intact distal pulses.          Femoral pulses are 2+ on the right side and 2+ on the left side.      Popliteal pulses are 0 on the right side and 0 on the left side.       Dorsalis pedis pulses are 0 on the right side and 0 on the left side.       Posterior tibial pulses are 0 on the right side and 2+ on the left side.     Heart sounds: Normal heart sounds. No murmur heard.    No gallop.     Comments: No JVD. Pulmonary:     Effort: Pulmonary effort is normal. No accessory muscle usage or respiratory distress.     Breath sounds: Normal breath sounds.  Abdominal:     General: Bowel sounds are normal.     Palpations: Abdomen is soft.     Hernia: A hernia is present. Hernia is present in the ventral area (reducible).  Musculoskeletal:     Right lower leg: No edema.     Left lower leg: No edema.   Physical exam unchanged compared to previous office visit.  Laboratory examination:   Lab Results  Component Value Date   NA 141 07/02/2022   K 4.9 07/02/2022   CO2 23 07/02/2022   GLUCOSE 86 07/02/2022   BUN 14 07/02/2022   CREATININE 0.95 07/02/2022   CALCIUM 10.2 07/02/2022   EGFR 87 07/02/2022   GFRNONAA >60 12/06/2020       Latest Ref Rng & Units 07/02/2022    3:40 PM 12/06/2020    1:08 AM 11/14/2020    5:42 AM  CMP  Glucose 70 - 99 mg/dL 86  101  133   BUN 8 - 27 mg/dL '14  10  15   ' Creatinine 0.76 - 1.27 mg/dL 0.95  0.92  1.11   Sodium 134 - 144 mmol/L 141  137  137   Potassium 3.5 - 5.2 mmol/L 4.9  4.4  5.0   Chloride 96 - 106 mmol/L 104  107  106   CO2 20 - 29 mmol/L '23  22  25   ' Calcium 8.6 - 10.2 mg/dL 10.2  8.8  9.2       Latest Ref Rng & Units 07/02/2022    3:40 PM 12/06/2020    1:08 AM 11/14/2020    5:42 AM  CBC  WBC 3.4 - 10.8 x10E3/uL 8.3  5.0  7.6   Hemoglobin 13.0 - 17.7 g/dL 12.7  10.1  10.5   Hematocrit 37.5 - 51.0 % 40.4  33.2  33.9   Platelets 150 - 450 x10E3/uL 287  262  300    Lipid Panel  Recent Labs    12/15/21 1239  CHOL 106  TRIG  151*  LDLCALC 46  HDL 34*    TSH No results for input(s): "TSH" in the last 8760 hours.  External labs:   Labs 06/04/2022:  Iron levels markedly reduced.  B12 and folate normal.  Hb 12.6/HCT 39.0, platelets 305, microcytic indicis.  Labs 12/24/2020:   Total cholesterol 218, triglycerides 151, HDL 38, LDL 140.   Allergies   Allergies  Allergen Reactions   Lisinopril Other (See Comments)    Hyperkalemia   Zolpidem Tartrate Other (See Comments)    Felt fuzzy and hallucinations   Final Medications at End of Visit    Current Outpatient Medications:    amLODipine (NORVASC) 10 MG tablet, Take 1 tablet (10 mg total) by mouth daily., Disp: 90 tablet, Rfl: 1   aspirin EC 81 MG tablet, Take 81 mg by mouth daily. Swallow whole., Disp: , Rfl:    Cholecalciferol (VITAMIN D3) 25 MCG (1000 UT) CAPS, Take 1 capsule by mouth daily., Disp: , Rfl:    diphenhydramine-acetaminophen (TYLENOL PM) 25-500 MG TABS tablet, Take 2 tablets by mouth at bedtime as needed (sleep)., Disp: , Rfl:    ezetimibe (ZETIA) 10 MG tablet, Take 1 tablet (10 mg total) by mouth daily after supper., Disp: 90 tablet, Rfl: 3   ferrous sulfate 325 (65 FE) MG tablet, Take 325 mg by mouth every Monday, Wednesday, and Friday., Disp: , Rfl:    fluorouracil (EFUDEX) 5 % cream, APPLY SUFFICIENT AMOUNT TO AFFECTED AREA TWICE A DAY TO THE ARMS AND HANDS TWICE DAILY FOR 21 DAYS THEN DO THE SIDES OF  THE FACE TWICE DAILY FOR 10 DAYS TO THE ARMS AND HANDS TWICE DAILY FOR 21 DAYS THEN DO THE SIDES OF   THE FACE TWICE DAILY FOR 10 DAYS, Disp: , Rfl:    hydrALAZINE (APRESOLINE) 50 MG tablet, Take 1 tablet (50 mg total) by mouth 3 (three) times daily., Disp: 270 tablet, Rfl: 3   metoprolol (LOPRESSOR) 50 MG tablet, Take 50 mg by mouth 2 (two) times daily. Hold if systolic blood pressure (top blood pressure number) less than 100 mmHg or heart rate less than 60 bpm (pulse)., Disp: , Rfl:    omeprazole (PRILOSEC) 20 MG capsule, Take 20 mg by  mouth 2 (two) times daily before a meal., Disp: , Rfl:    rosuvastatin (CRESTOR) 40 MG tablet, TAKE 1 TABLET BY MOUTH EVERYDAY AT BEDTIME, Disp: 90 tablet, Rfl: 1   tiotropium (SPIRIVA) 18 MCG inhalation capsule, Place 18 mcg into inhaler and inhale daily., Disp: , Rfl:    vitamin B-12 (CYANOCOBALAMIN) 500 MCG tablet, Take 1,000 mcg by mouth daily., Disp: , Rfl:    Radiology:   CT scan of the chest 07/01/2022: Mild emphysema and scattered pulmonary nodules.  Cardiac Studies:   Coronary bypass grafting surgery in 2011: LIMA to the LAD, SVG to diagonal branch, free radial artery to OM branch.  Lower extremity arterial duplex 11/05/2019:  Abnormal ABIs bilaterally suggestive of peripheral vascular disease.   Moderately abnormal pulse volume recordings at the level of bilateral ankles. Arterial duplex (right) demonstrates multiphasic waveform up to the popliteal artery.  Posterior tibial artery and anterior tibial artery noted to have monophasic waveform. Arterial duplex (left) demonstrates multiphasic waveform except at the level of mid SFA.  Mid SFA demonstrates a monophasic waveform with near occlusion and diffuse plaque. Conclusions: This exam reveals moderately decreased perfusion of the right lower extremity, noted at the post  tibial artery level (ABI 0.67) and moderately decreased perfusion of the left lower extremity, noted at the post tibial artery level (ABI 0.73).  PCV ECHOCARDIOGRAM COMPLETE 05/28/2022  Normal LV systolic function with visual EF 55-60%. Left ventricle cavity is normal in size. Moderate concentric hypertrophy of the left ventricle. Normal global wall motion. Doppler evidence of grade I (impaired) diastolic dysfunction, elevated LAP. Calculated EF 55%. Left atrial cavity is moderately dilated at 5.8 cm. A lipomatous septum is present. Structurally normal tricuspid valve with trace regurgitation. No evidence of pulmonary hypertension.    PCV MYOCARDIAL PERFUSION WITH  LEXISCAN 06/08/2022  Narrative Lexiscan (with Mod Bruce protocol) Nuclear stress test 06/08/22 Myocardial perfusion is abnormal. High risk study. There is a fixed defect with no uptake in the apical cap in the apical region. There is a reversible severe defect in the anterior, lateral and inferior regions. Overall LV systolic function is abnormal with decreased thickening in the apex. Stress LV EF: 67% however visually appears lower. TID ratio 1.26, which is abnormal. Nondiagnostic ECG stress. The heart rate response was consistent with Regadenoson. The blood pressure response was physiologic. Compared to 12/20/2017, there was apical scar and small mild basal to mid inferior ischemia, EF 59%.   Left Heart Catheterization 07/21/22:  LV: 144/6, EDP 21 mmHg.  Ao 148/65, mean 98 mmHg.  No pressure gradient across the aortic valve.  EDP moderately elevated. LVEF: 55 to 60%. LM: Widely patent. LAD: Occluded in the proximal segment.  Very small diagonals.  D1 is occluded.  Mid to distal LAD supplied by LIMA to LAD.  D1 is supplied by SVG, it is occluded. CX: Moderate caliber vessel, OM1 is moderate to large, has mild diffuse disease.  OM 2 is occluded in the proximal segment.  Distal circumflex is diffusely diseased.  OM 2 is supplied by free radial graft.  Graft is patent. RCA: Very large caliber vessel and a superdominant vessel.  Mild disease is noted.  RIMA to RCA is functionally occluded.   LIMA to LAD: Patent, SVG to D1: Occluded, free radial graft to OM 2 patent, RIMA to RCA functionally occluded.   Impression: Abnormal nuclear stress test secondary to occluded SVG to D2.  Overall LVEF is preserved.  Hence overall low risk.  Continue medical management only.  Discontinue Plavix, continue aspirin only.      EKG   04/14/2022: Sinus rhythm at a rate of 56 bpm.  Normal axis.  Right bundle branch block.  LVH with secondary repolarization abnormality, cannot exclude inferior and anterior lateral  ischemia.  Compared EKG 10/31/2021, no significant change.  Assessment     ICD-10-CM   1. Atherosclerosis of native coronary artery of native heart without angina pectoris  I25.10     2. Essential hypertension  I10     3. Mixed hyperlipidemia  E78.2 Lipid Panel With LDL/HDL Ratio      No orders of the defined types were placed in this encounter.    There are no discontinued medications.   Recommendations:   ZAMARI VEA is a 66 y.o. with CAD SP CABG in 2001, obesity, OSA on CPAP, mixed hyperlipidemia, hypertension, former smoker, history of lung cancer status post chemotherapy and left lower lobectomy, PAD and symptoms of claudication involving his right calf worse than the left calf.   Due to worsening dyspnea.  Nuclear stress test was performed which had revealed severe anterior ischemia.  He underwent cardiac catheterization on 07/21/2022 revealing an occluded saphenous vein graft to a  moderate-sized D1, probable reason for his ischemia.  However there was no revascularization option, given normal LVEF, overall felt to be low risk.  I reviewed this with the patient, reassured him.  He has lost about 15 pounds in weight over the past 3 to 4 months, he will continue to do this and also increase his physical activity.  He felt reassured.  Blood pressure is well controlled.  His lipids were under excellent control, he had some confusion regarding rosuvastatin, advised him to get lipid profile testing done a copy of the request given to the patient to take it to [place] for repeating his labs.  Otherwise he is stable from cardiac standpoint, he is now off of Plavix and scheduled for GI evaluation in view of anemia and guaiac positive stool.  Hopefully stopping clopidogrel will help with anemia as well.  I will see him back on an annual basis.   Adrian Prows, PA-C 07/29/2022, 11:16 AM Office: [phone]

## 2022-08-19 ENCOUNTER — Encounter: Payer: Self-pay | Admitting: Gastroenterology

## 2022-08-19 ENCOUNTER — Ambulatory Visit (INDEPENDENT_AMBULATORY_CARE_PROVIDER_SITE_OTHER): Payer: No Typology Code available for payment source | Admitting: Gastroenterology

## 2022-08-19 VITALS — BP 118/60 | HR 68 | Ht 70.5 in | Wt 286.1 lb

## 2022-08-19 DIAGNOSIS — Z86010 Personal history of colonic polyps: Secondary | ICD-10-CM | POA: Diagnosis not present

## 2022-08-19 DIAGNOSIS — D509 Iron deficiency anemia, unspecified: Secondary | ICD-10-CM

## 2022-08-19 MED ORDER — PEG 3350-KCL-NA BICARB-NACL 420 G PO SOLR: 4000.0000 mL | Freq: Once | ORAL | 0 refills | Status: AC

## 2022-08-19 NOTE — Progress Notes (Signed)
[facility] Consult Note:  History: Sean Gardner 08/19/2022  Referring provider: Jule Ser [place]  Reason for consult/chief complaint: Colon Polyps (Hx of TVA polyps, hx of lung cancer, to discuss having a ECL) and Gastroesophageal Reflux (To discuss having ECL, on Omeprazole)   Subjective  HPI: This is a 66 year old man referred from local [place] clinic, possibly for heme positive stool..  No further clinical information regarding that finding was received at the time of the initial referral.  Extensive chart review was required by me and my staff to find pertinent [place] records during today's visit.  When we first received the referral, I was asked to triage the referral and sent the following note in the EMR: "Dr. Einar Gip,   I read your recent office note and see that you are planning a cardiac catheterization for this patient's symptoms and abnormal nuclear stress test.  With that, I also see your concerns about his reported heme positive stool, particularly as regards the plan for possible coronary stent placement if needed.   I have not yet evaluated this patient as he is new to our practice, and I most likely do not have a new patient appointment for several weeks.  In addition, I have not yet been given any records from the [place] for review.  Thus it is difficult to make full recommendations regarding his care.  However, such patients are at clearly at high risk for cardiac complications of anesthesia for endoscopic procedures, and are therefore generally best served by having their cardiac catheterization and any necessary coronary stent placement attended to prior to their endoscopic procedures.   Wilfrid Lund, [facility]"  _______  Mr. Geller underwent cardiac catheterization 07/21/2022 with Dr. Einar Gip, and essentially there was multivessel post CABG CAD and no stent or other intervention taken.  Full report below. You are able to obtain records from the [facility]  practice including report from a colonoscopy by Dr. Paulita Fujita in January 2014. Transverse sigmoid colon tubular adenomas without high-grade dysplasia and hyperplastic polyp removed. 35 mm sessile rectal polyp removed piecemeal with hot snare. 2 separate bottles labeled rectal polyp cold report tubulovillous adenoma with high-grade dysplasia.  There was low-grade dysplasia to the cauterized edge, the specimen received in multiple pieces. Dr. Erlinda Hong handwritten notes on the pathology report indicates possible plans for sigmoidoscopy about 8 weeks later  Mr. Blanck has limited health literacy and recollection of medical procedures and dates, but he believed that the colonoscopy with Dr. Paulita Fujita was the most recent one he had. However, when some records were reviewed from the [facility] outpatient clinic dated 06/05/2022, that office note indicates he was being seen for iron deficiency anemia.  WBC 8.2, hemoglobin 12.6, MCV 83, RDW 16.7, platelet 305  Iron 28, TIBC 427 (6.6% saturation), ferritin 8.8  That note also references a colonoscopy (presumably done at the [facility]) April 2019 with an indication of surveillance.  Diminutive 3 mm splenic flexure tubular adenoma removed and 1 mm sigmoid hyperplastic polyp removed.  Colonoscopy otherwise normal.  Mr. Vertz says he has not had any stool test done that he can recall, he has no change in bowel habits or rectal bleeding.  He has also been on omeprazole for many years, and really cannot recall why it was first started.  He currently does not feel heartburn regurgitation dysphagia, odynophagia, nausea vomiting or satiety.   ROS:  Review of Systems Dyspnea with exertion, denies chest pain Blister on his lip that has  bothered him since some recent dental work, he thinks he may have chew on it at nighttime because it periodically bleeds.  Remainder systems negative except as above  Past Medical History: Past Medical History:  Diagnosis Date    Adenomatous colon polyp    Claudication in peripheral vascular disease (HCC)    GERD (gastroesophageal reflux disease)    Hyperlipidemia    Hypertension    Lung cancer ([place])    neuroendocrine lung ca dx 2010   Myocardial infarction [facility]) 07/2000   Peripheral vascular disease (HCC)    Shortness of breath    on exertion,can't lay on his back   Sleep apnea    cpap     Past Surgical History: Past Surgical History:  Procedure Laterality Date   ACHILLES TENDON SURGERY Left    ANTERIOR HIP REVISION Right 11/13/2020   Procedure: IRRIGATION AND DEBRIDEMENT RIGHT HIP;  Surgeon: Leandrew Koyanagi, MD;  Location: [facility];  Service: Orthopedics;  Laterality: Right;   CORONARY ARTERY BYPASS GRAFT  08/18/2000   LIMA--dLIMA, RIMA-dRCA, left RA-OM2, SVG-D2   FLEXIBLE SIGMOIDOSCOPY N/A 01/18/2013   Procedure: FLEXIBLE SIGMOIDOSCOPY;  Surgeon: Arta Silence, MD;  [facility];  Service: Endoscopy;  Laterality: N/A;   HOT HEMOSTASIS N/A 01/18/2013   Procedure: HOT HEMOSTASIS (ARGON PLASMA COAGULATION/BICAP);  Surgeon: Arta Silence, MD;  Location: [facility];  Service: Endoscopy;  Laterality: N/A;   INCISION AND DRAINAGE HIP Right 12/05/2020   Procedure: IRRIGATION AND DEBRIDEMENT RIGHT HIP;  Surgeon: Leandrew Koyanagi, MD;  Location: [place];  Service: Orthopedics;  Laterality: Right;   INGUINAL HERNIA REPAIR Bilateral    inguinal   LEFT HEART CATH AND CORS/GRAFTS ANGIOGRAPHY N/A 07/21/2022   Procedure: LEFT HEART CATH AND CORS/GRAFTS ANGIOGRAPHY;  Surgeon: Adrian Prows, MD;  Location: [place] [facility];  Service: Cardiovascular;  Laterality: N/A;   LUNG SURGERY Left 01/29/2009   left lower lobectomy    TOTAL HIP ARTHROPLASTY Right 09/16/2020   Procedure: RIGHT TOTAL HIP ARTHROPLASTY ANTERIOR APPROACH;  Surgeon: Leandrew Koyanagi, MD;  Location: [facility];  Service: Orthopedics;  Laterality: Right;     Family History: Family History  Problem Relation Age of Onset   Hypertension Mother    Dementia  Mother    Lung cancer Father        Agent orange   Heart disease Brother    Heart disease Brother     Social History: Social History   Socioeconomic History   Marital status: Married    Spouse name: Not on file   Number of children: 2   Years of education: Not on file   Highest education level: Not on file  Occupational History   Occupation: retired  Tobacco Use   Smoking status: Former    Packs/day: 2.00    Years: 20.00    Total pack years: 40.00    Types: Cigarettes    Quit date: 10/19/2008    Years since quitting: 13.8   Smokeless tobacco: Never  Vaping Use   Vaping Use: Never used  Substance and Sexual Activity   Alcohol use: Not Currently   Drug use: No   Sexual activity: Not on file  Other Topics Concern   Not on file  Social History Narrative   Not on file   Social Determinants of Health   Financial Resource Strain: Not on file  Food Insecurity: Not on file  Transportation Needs: Not on file  Physical Activity: Not on file  Stress: Not on file  Social Connections: Not on file    Allergies: Allergies  Allergen Reactions   Lisinopril Other (See Comments)    Hyperkalemia   Zolpidem Tartrate Other (See Comments)    Felt fuzzy and hallucinations    Outpatient Meds: Current Outpatient Medications  Medication Sig Dispense Refill   amLODipine (NORVASC) 10 MG tablet Take 1 tablet (10 mg total) by mouth daily. 90 tablet 1   aspirin EC 81 MG tablet Take 81 mg by mouth daily. Swallow whole.     Cholecalciferol (VITAMIN D3) 25 MCG (1000 UT) CAPS Take 1 capsule by mouth daily.     diphenhydramine-acetaminophen (TYLENOL PM) 25-500 MG TABS tablet Take 2 tablets by mouth at bedtime as needed (sleep).     ezetimibe (ZETIA) 10 MG tablet Take 1 tablet (10 mg total) by mouth daily after supper. 90 tablet 3   ferrous sulfate 325 (65 FE) MG tablet Take 325 mg by mouth every Monday, Wednesday, and Friday.     hydrALAZINE (APRESOLINE) 50 MG tablet Take 1 tablet (50 mg  total) by mouth 3 (three) times daily. 270 tablet 3   metoprolol (LOPRESSOR) 50 MG tablet Take 50 mg by mouth 2 (two) times daily. Hold if systolic blood pressure (top blood pressure number) less than 100 mmHg or heart rate less than 60 bpm (pulse).     omeprazole (PRILOSEC) 20 MG capsule Take 20 mg by mouth 2 (two) times daily before a meal.     polyethylene glycol-electrolytes (NULYTELY) 420 g solution Take 4,000 mLs by mouth once for 1 dose. For colonoscopy prep 4000 mL 0   rosuvastatin (CRESTOR) 40 MG tablet TAKE 1 TABLET BY MOUTH EVERYDAY AT BEDTIME 90 tablet 1   tiotropium (SPIRIVA) 18 MCG inhalation capsule Place 18 mcg into inhaler and inhale daily.     vitamin B-12 (CYANOCOBALAMIN) 500 MCG tablet Take 1,000 mcg by mouth daily.     fluorouracil (EFUDEX) 5 % cream APPLY SUFFICIENT AMOUNT TO AFFECTED AREA TWICE A DAY TO THE ARMS AND HANDS TWICE DAILY FOR 21 DAYS THEN DO THE SIDES OF  THE FACE TWICE DAILY FOR 10 DAYS TO THE ARMS AND HANDS TWICE DAILY FOR 21 DAYS THEN DO THE SIDES OF   THE FACE TWICE DAILY FOR 10 DAYS (Patient not taking: Reported on 08/19/2022)     No current facility-administered medications for this visit.      ___________________________________________________________________ Objective   Exam:  BP 118/60 (BP Location: Left Arm, Patient Position: Sitting, Cuff Size: Large)   Pulse 68   Ht 5' 10.5" (1.791 m) Comment: height measured without shoes  Wt 286 lb 2 oz (129.8 kg)   BMI 40.47 kg/m  Wt Readings from Last 3 Encounters:  08/19/22 286 lb 2 oz (129.8 kg)  07/29/22 287 lb 3.2 oz (130.3 kg)  07/21/22 285 lb (129.3 kg)    General: Pleasant and conversational, breathing comfortably on room air.  Gets on exam table without assistance. Eyes: sclera anicteric, no redness ENT: oral mucosa moist without lesions, no cervical or supraclavicular lymphadenopathy.  Thick neck.  No loose dentition.  Blister on the mid lower lip CV: Regular without appreciable murmur, no  JVD, no peripheral edema Resp: clear to auscultation bilaterally, normal RR and effort noted GI: soft, no tenderness, with active bowel sounds. No guarding or palpable organomegaly noted.  Rectus diastasis Skin; warm and dry, no rash or jaundice noted Neuro: awake, alert and oriented x 3. Normal gross motor function and fluent speech  Data:  Latest Ref Rng & Units 07/02/2022    3:40 PM 12/06/2020    1:08 AM 11/14/2020    5:42 AM  CBC  WBC 3.4 - 10.8 x10E3/uL 8.3  5.0  7.6   Hemoglobin 13.0 - 17.7 g/dL 12.7  10.1  10.5   Hematocrit 37.5 - 51.0 % 40.4  33.2  33.9   Platelets 150 - 450 x10E3/uL 287  262  300       Latest Ref Rng & Units 07/02/2022    3:40 PM 12/06/2020    1:08 AM 11/14/2020    5:42 AM  CMP  Glucose 70 - 99 mg/dL 86  101  133   BUN 8 - 27 mg/dL 14  10  15    Creatinine 0.76 - 1.27 mg/dL 0.95  0.92  1.11   Sodium 134 - 144 mmol/L 141  137  137   Potassium 3.5 - 5.2 mmol/L 4.9  4.4  5.0   Chloride 96 - 106 mmol/L 104  107  106   CO2 20 - 29 mmol/L 23  22  25    Calcium 8.6 - 10.2 mg/dL 10.2  8.8  9.2      ___________________  PCV ECHOCARDIOGRAM COMPLETE 05/28/2022  Normal LV systolic function with visual EF 55-60%. Left ventricle cavity is normal in size. Moderate concentric hypertrophy of the left ventricle. Normal global wall motion. Doppler evidence of grade I (impaired) diastolic dysfunction, elevated LAP. Calculated EF 55%. Left atrial cavity is moderately dilated at 5.8 cm. A lipomatous septum is present. Structurally normal tricuspid valve with trace regurgitation. No evidence of pulmonary hypertension.    PCV MYOCARDIAL PERFUSION WITH LEXISCAN 06/08/2022   Narrative Lexiscan (with Mod Bruce protocol) Nuclear stress test 06/08/22 Myocardial perfusion is abnormal. High risk study. There is a fixed defect with no uptake in the apical cap in the apical region. There is a reversible severe defect in the anterior, lateral and inferior regions. Overall LV  systolic function is abnormal with decreased thickening in the apex. Stress LV EF: 67% however visually appears lower. TID ratio 1.26, which is abnormal. Nondiagnostic ECG stress. The heart rate response was consistent with Regadenoson. The blood pressure response was physiologic. Compared to 12/20/2017, there was apical scar and small mild basal to mid inferior ischemia, EF 59%.  __________________________________ Left Heart Catheterization 07/21/22:  LV: 144/6, EDP 21 mmHg.  Ao 148/65, mean 98 mmHg.  No pressure gradient across the aortic valve.  EDP moderately elevated. LVEF: 55 to 60%. LM: Widely patent. LAD: Occluded in the proximal segment.  Very small diagonals.  D1 is occluded.  Mid to distal LAD supplied by LIMA to LAD.  D1 is supplied by SVG, it is occluded. CX: Moderate caliber vessel, OM1 is moderate to large, has mild diffuse disease.  OM 2 is occluded in the proximal segment.  Distal circumflex is diffusely diseased.  OM 2 is supplied by free radial graft.  Graft is patent. RCA: Very large caliber vessel and a superdominant vessel.  Mild disease is noted.  RIMA to RCA is functionally occluded.   LIMA to LAD: Patent, SVG to D1: Occluded, free radial graft to OM 2 patent, RIMA to RCA functionally occluded.   Impression: Abnormal nuclear stress test secondary to occluded SVG to D2.  Overall LVEF is preserved.  Hence overall low risk.  Continue medical management only.  Discontinue Plavix, continue aspirin only.     Assessment: Encounter Diagnoses  Name Primary?   Iron deficiency anemia, unspecified iron deficiency anemia  type Yes   Personal history of colonic polyps     Iron deficiency anemia, unknown if stool testing done for FOBT. History of advanced adenoma with high-grade dysplasia in the rectum, reportedly no recurrence of that on the 2019 colonoscopy at the [place].  He needs endoscopic work-up for the IDA to rule out a source of occult GI blood loss.  He is on aspirin  chronically, raising suspicion for peptic ulcer.  EGD and colonoscopy recommended.  He was agreeable after discussion of procedure and risks.  The benefits and risks of the planned procedure were described in detail with the patient or (when appropriate) their health care proxy.  Risks were outlined as including, but not limited to, bleeding, infection, perforation, adverse medication reaction leading to cardiac or pulmonary decompensation, pancreatitis (if ERCP).  The limitation of incomplete mucosal visualization was also discussed.  No guarantees or warranties were given. Patient at increased risk for cardiopulmonary complications of procedure due to medical comorbidities.  (CAD, obesity, OSA)  Recent cardiac catheterization with coronary disease that did not require intervention.  Likely multifactorial dyspnea.  Thank you for the courtesy of this consult.  Please call me with any questions or concerns.  60 minutes were spent on this encounter (including chart review, history/exam, counseling/coordination of care, and documentation) > 50% of that time was spent on counseling and coordination of care.  Nelida Meuse III  CC: Referring provider noted above

## 2022-08-19 NOTE — Patient Instructions (Signed)
_______________________________________________________  If you are age 68 or older, your body mass index should be between 23-30. Your Body mass index is 40.47 kg/m. If this is out of the aforementioned range listed, please consider follow up with your Primary Care Provider.  If you are age 31 or younger, your body mass index should be between 19-25. Your Body mass index is 40.47 kg/m. If this is out of the aformentioned range listed, please consider follow up with your Primary Care Provider.   ________________________________________________________  The [facility] providers would like to encourage you to use [facility] to communicate with providers for non-urgent requests or questions.  Due to long hold times on the telephone, sending your provider a message by [facility] may be a faster and more efficient way to get a response.  Please allow 48 business hours for a response.  Please remember that this is for non-urgent requests.  _______________________________________________________  Sean Gardner have been scheduled for an endoscopy and colonoscopy. Please follow the written instructions given to you at your visit today. Please pick up your prep supplies at the pharmacy within the next 1-3 days. If you use inhalers (even only as needed), please bring them with you on the day of your procedure.  Due to recent changes in healthcare laws, you may see the results of your imaging and laboratory studies on MyChart before your provider has had a chance to review them.  We understand that in some cases there may be results that are confusing or concerning to you. Not all laboratory results come back in the same time frame and the provider may be waiting for multiple results in order to interpret others.  Please give [name] 48 hours in order for your provider to thoroughly review all the results before contacting the office for clarification of your results.   It was a pleasure to see you today!  Thank you for  trusting me with your gastrointestinal care!

## 2022-09-30 ENCOUNTER — Encounter: Payer: Non-veteran care | Admitting: Gastroenterology

## 2022-10-01 ENCOUNTER — Encounter: Payer: Self-pay | Admitting: Certified Registered Nurse Anesthetist

## 2022-10-02 ENCOUNTER — Encounter: Payer: Self-pay | Admitting: Gastroenterology

## 2022-10-02 ENCOUNTER — Ambulatory Visit (AMBULATORY_SURGERY_CENTER): Payer: No Typology Code available for payment source | Admitting: Gastroenterology

## 2022-10-02 VITALS — BP 170/90 | HR 71 | Temp 98.2°F | Resp 12 | Ht 70.0 in | Wt 286.0 lb

## 2022-10-02 DIAGNOSIS — D123 Benign neoplasm of transverse colon: Secondary | ICD-10-CM | POA: Diagnosis not present

## 2022-10-02 DIAGNOSIS — D120 Benign neoplasm of cecum: Secondary | ICD-10-CM | POA: Diagnosis not present

## 2022-10-02 DIAGNOSIS — K3189 Other diseases of stomach and duodenum: Secondary | ICD-10-CM

## 2022-10-02 DIAGNOSIS — D509 Iron deficiency anemia, unspecified: Secondary | ICD-10-CM | POA: Diagnosis present

## 2022-10-02 DIAGNOSIS — D124 Benign neoplasm of descending colon: Secondary | ICD-10-CM | POA: Diagnosis not present

## 2022-10-02 DIAGNOSIS — Z860100 Personal history of colon polyps, unspecified: Secondary | ICD-10-CM

## 2022-10-02 DIAGNOSIS — K269 Duodenal ulcer, unspecified as acute or chronic, without hemorrhage or perforation: Secondary | ICD-10-CM

## 2022-10-02 HISTORY — PX: COLONOSCOPY: SHX174

## 2022-10-02 HISTORY — PX: UPPER GI ENDOSCOPY: SHX6162

## 2022-10-02 MED ORDER — SODIUM CHLORIDE 0.9 % IV SOLN: 500.0000 mL | Freq: Once | INTRAVENOUS | Status: DC

## 2022-10-02 MED ORDER — OMEPRAZOLE 40 MG PO CPDR: 40.0000 mg | DELAYED_RELEASE_CAPSULE | Freq: Two times a day (BID) | ORAL | 1 refills | Status: DC

## 2022-10-02 NOTE — Progress Notes (Signed)
History and Physical:  This patient presents for endoscopic testing for: Encounter Diagnoses  Name Primary?   Iron deficiency anemia, unspecified iron deficiency anemia type Yes   Personal history of colonic polyps     Clinical details in 08/19/2022 [facility] office consult note. Patient reports no clinical changes since that visit. Of note, peripheral IV access was very challenging in this patient, and future endoscopic procedures need to be done in the hospital outpatient endoscopy lab.  Patient reports no clinical changes since last office visit with me.  Patient is otherwise without complaints or active issues today.   Past Medical History: Past Medical History:  Diagnosis Date   Adenomatous colon polyp    Claudication in peripheral vascular disease ([place])    GERD (gastroesophageal reflux disease)    Hyperlipidemia    Hypertension    Lung cancer ([place])    neuroendocrine lung ca dx 2010   Myocardial infarction [facility]) 07/2000   Peripheral vascular disease (HCC)    Shortness of breath    on exertion,can't lay on his back   Sleep apnea    cpap     Past Surgical History: Past Surgical History:  Procedure Laterality Date   ACHILLES TENDON SURGERY Left    ANTERIOR HIP REVISION Right 11/13/2020   Procedure: IRRIGATION AND DEBRIDEMENT RIGHT HIP;  Surgeon: Leandrew Koyanagi, MD;  Location: [place];  Service: Orthopedics;  Laterality: Right;   CORONARY ARTERY BYPASS GRAFT  08/18/2000   LIMA--dLIMA, RIMA-dRCA, left RA-OM2, SVG-D2   FLEXIBLE SIGMOIDOSCOPY N/A 01/18/2013   Procedure: FLEXIBLE SIGMOIDOSCOPY;  Surgeon: Arta Silence, MD;  [facility];  Service: Endoscopy;  Laterality: N/A;   HOT HEMOSTASIS N/A 01/18/2013   Procedure: HOT HEMOSTASIS (ARGON PLASMA COAGULATION/BICAP);  Surgeon: Arta Silence, MD;  Location[facility];  Service: Endoscopy;  Laterality: N/A;   INCISION AND DRAINAGE HIP Right 12/05/2020   Procedure: IRRIGATION AND DEBRIDEMENT RIGHT HIP;   Surgeon: Leandrew Koyanagi, MD;  Location: [place];  Service: Orthopedics;  Laterality: Right;   INGUINAL HERNIA REPAIR Bilateral    inguinal   LEFT HEART CATH AND CORS/GRAFTS ANGIOGRAPHY N/A 07/21/2022   Procedure: LEFT HEART CATH AND CORS/GRAFTS ANGIOGRAPHY;  Surgeon: Adrian Prows, MD;  Location: [place] [facility];  Service: Cardiovascular;  Laterality: N/A;   LUNG SURGERY Left 01/29/2009   left lower lobectomy    TOTAL HIP ARTHROPLASTY Right 09/16/2020   Procedure: RIGHT TOTAL HIP ARTHROPLASTY ANTERIOR APPROACH;  Surgeon: Leandrew Koyanagi, MD;  Location: [place];  Service: Orthopedics;  Laterality: Right;    Allergies: Allergies  Allergen Reactions   Lisinopril Other (See Comments)    Hyperkalemia   Zolpidem Tartrate Other (See Comments)    Felt fuzzy and hallucinations    Outpatient Meds: Current Outpatient Medications  Medication Sig Dispense Refill   amLODipine (NORVASC) 10 MG tablet Take 1 tablet (10 mg total) by mouth daily. 90 tablet 1   aspirin EC 81 MG tablet Take 81 mg by mouth daily. Swallow whole.     Cholecalciferol (VITAMIN D3) 25 MCG (1000 UT) CAPS Take 1 capsule by mouth daily.     diphenhydramine-acetaminophen (TYLENOL PM) 25-500 MG TABS tablet Take 2 tablets by mouth at bedtime as needed (sleep).     ezetimibe (ZETIA) 10 MG tablet Take 1 tablet (10 mg total) by mouth daily after supper. 90 tablet 3   ferrous sulfate 325 (65 FE) MG tablet Take 325 mg by mouth every Monday, Wednesday, and Friday.  hydrALAZINE (APRESOLINE) 50 MG tablet Take 1 tablet (50 mg total) by mouth 3 (three) times daily. 270 tablet 3   metoprolol (LOPRESSOR) 50 MG tablet Take 50 mg by mouth 2 (two) times daily. Hold if systolic blood pressure (top blood pressure number) less than 100 mmHg or heart rate less than 60 bpm (pulse).     rosuvastatin (CRESTOR) 40 MG tablet TAKE 1 TABLET BY MOUTH EVERYDAY AT BEDTIME 90 tablet 1   tiotropium (SPIRIVA) 18 MCG inhalation capsule Place 18 mcg into inhaler and  inhale daily.     vitamin B-12 (CYANOCOBALAMIN) 500 MCG tablet Take 1,000 mcg by mouth daily.     fluorouracil (EFUDEX) 5 % cream APPLY SUFFICIENT AMOUNT TO AFFECTED AREA TWICE A DAY TO THE ARMS AND HANDS TWICE DAILY FOR 21 DAYS THEN DO THE SIDES OF  THE FACE TWICE DAILY FOR 10 DAYS TO THE ARMS AND HANDS TWICE DAILY FOR 21 DAYS THEN DO THE SIDES OF   THE FACE TWICE DAILY FOR 10 DAYS (Patient not taking: Reported on 08/19/2022)     omeprazole (PRILOSEC) 20 MG capsule Take 20 mg by mouth 2 (two) times daily before a meal. (Patient not taking: Reported on 10/02/2022)     Current Facility-Administered Medications  Medication Dose Route Frequency Provider Last Rate Last Admin   0.9 %  sodium chloride infusion  500 mL Intravenous Once Danis, Kirke Corin, MD          ___________________________________________________________________ Objective   Exam:  BP 135/72   Pulse 76   Temp 98.2 F (36.8 C) (Skin)   Ht 5\' 10"  (1.778 m)   Wt 286 lb (129.7 kg)   SpO2 98%   BMI 41.04 kg/m   CV: regular , S1/S2 Resp: clear to auscultation bilaterally, normal RR and effort noted GI: soft, no tenderness, with active bowel sounds.   Assessment: Encounter Diagnoses  Name Primary?   Iron deficiency anemia, unspecified iron deficiency anemia type Yes   Personal history of colonic polyps      Plan: Colonoscopy EGD  The benefits and risks of the planned procedure were described in detail with the patient or (when appropriate) their health care proxy.  Risks were outlined as including, but not limited to, bleeding, infection, perforation, adverse medication reaction leading to cardiac or pulmonary decompensation, pancreatitis (if ERCP).  The limitation of incomplete mucosal visualization was also discussed.  No guarantees or warranties were given.    The patient is appropriate for an endoscopic procedure in the ambulatory setting.   - Wilfrid Lund, MD

## 2022-10-02 NOTE — Progress Notes (Signed)
1153 Nasopharyngeal airway size 8.0  placed without trauma, vss

## 2022-10-02 NOTE — Op Note (Signed)
[name] Patient Name: Sean Gardner Procedure Date: 10/02/2022 10:57 AM MRN: [id number] Endoscopist: Holgate. Loletha Carrow , MD, [id number] Age: 66 Referring MD:  Date of Birth: 10-Jul-1956 Gender: Male Account #: [id number] Procedure:                Upper GI endoscopy Indications:              Unexplained iron deficiency anemia Medicines:                Monitored Anesthesia Care Procedure:                Pre-Anesthesia Assessment:                           - Prior to the procedure, a History and Physical                            was performed, and patient medications and                            allergies were reviewed. The patient's tolerance of                            previous anesthesia was also reviewed. The risks                            and benefits of the procedure and the sedation                            options and risks were discussed with the patient.                            All questions were answered, and informed consent                            was obtained. Prior Anticoagulants: The patient has                            taken no anticoagulant or antiplatelet agents. ASA                            Grade Assessment: III - A patient with severe                            systemic disease. After reviewing the risks and                            benefits, the patient was deemed in satisfactory                            condition to undergo the procedure.                           After obtaining informed consent, the endoscope was  passed under direct vision. Throughout the                            procedure, the patient's blood pressure, pulse, and                            oxygen saturations were monitored continuously. The                            GIF [id number] #[id number] was introduced through the                            mouth, and advanced to the second part of duodenum.                            The upper GI  endoscopy was accomplished without                            difficulty. The patient tolerated the procedure                            fairly well. Scope In: Scope Out: Findings:                 The esophagus was normal.                           The entire examined stomach was normal. Several                            biopsies were obtained on the greater curvature of                            the gastric body, on the lesser curvature of the                            gastric body, on the greater curvature of the                            gastric antrum and on the lesser curvature of the                            gastric antrum with cold forceps for histology.                            (r/o H pylori due to DU)                           Multiple non-bleeding superficial duodenal ulcers                            with pigmented material were found in the second  portion of the duodenum and sweep. The largest                            lesion was 10 mm in largest dimension. Biopsies for                            histology were taken with a cold forceps for                            evaluation of celiac disease. Complications:            No immediate complications. Estimated Blood Loss:     Estimated blood loss was minimal. Impression:               - Normal esophagus.                           - Normal stomach.                           - Non-bleeding duodenal ulcers with pigmented                            material. Biopsied.                           - Several biopsies were obtained on the greater                            curvature of the gastric body, on the lesser                            curvature of the gastric body, on the greater                            curvature of the gastric antrum and on the lesser                            curvature of the gastric antrum. Recommendation:           - Patient has a contact number available for                             emergencies. The signs and symptoms of potential                            delayed complications were discussed with the                            patient. Return to normal activities tomorrow.                            Written discharge instructions were provided to the                            patient.                           -  Resume previous diet.                           - Continue present medications.                           - Await pathology results.                           - Omeprazole 40 mg twice daily. Disp#60 RF 1                           (put aside current supply of 20 mg twice daily to                            be used later)                           If biopsy negative for H pylori, then ulcer is most                            likely caused by aspirin. In that case, patient                            will ned to consult with Cardiology about a                            possible change from aspirin to plavix for                            underlying multivessel CAD.                           Follow up with referring/primary care physician                            regarding anemia and iron dosing.                           Follow up with me in 8 weeks.                           PERIPHERAL IV ACCESS VERY CHALLENGING IN THIS                            PATIENT - FUTURE ENDOSCOPIC [facility]. Radin Raptis L. Loletha Carrow, MD 10/02/2022 12:35:38 PM This report has been signed electronically.

## 2022-10-02 NOTE — Patient Instructions (Addendum)
Resume previous diet. - Continue present medications. - Await pathology results. - Repeat colonoscopy is recommended for surveillance. The colonoscopy date will be determined after pathology results from today's exam become available for review.  Omeprazole 40 mg twice daily. Disp#60 RF 1 (put aside current supply of 20 mg twice daily to be used later) If biopsy negative for H pylori, then ulcer is most likely caused by aspirin. In that case, patient will ned to consult with Cardiology about a possible change from aspirin to plavix for underlying multivessel CAD. Follow up with referring/primary care physician regarding anemia and iron dosing. Follow up with me in 8 weeks  YOU HAD AN ENDOSCOPIC PROCEDURE TODAY: Refer to the procedure report and other information in the discharge instructions given to you for any specific questions about what was found during the examination. If this information does not answer your questions, please call [place] office at [phone] to clarify.   YOU SHOULD EXPECT: Some feelings of bloating in the abdomen. Passage of more gas than usual. Walking can help get rid of the air that was put into your GI tract during the procedure and reduce the bloating. If you had a lower endoscopy (such as a colonoscopy or flexible sigmoidoscopy) you may notice spotting of blood in your stool or on the toilet paper. Some abdominal soreness may be present for a day or two, also.  DIET: Your first meal following the procedure should be a light meal and then it is ok to progress to your normal diet. A half-sandwich or bowl of soup is an example of a good first meal. Heavy or fried foods are harder to digest and may make you feel nauseous or bloated. Drink plenty of fluids but you should avoid alcoholic beverages for 24 hours. If you had a esophageal dilation, please see attached instructions for diet.    ACTIVITY: Your care partner should take you home directly after the procedure.  You should plan to take it easy, moving slowly for the rest of the day. You can resume normal activity the day after the procedure however YOU SHOULD NOT DRIVE, use power tools, machinery or perform tasks that involve climbing or major physical exertion for 24 hours (because of the sedation medicines used during the test).   SYMPTOMS TO REPORT IMMEDIATELY: A gastroenterologist can be reached at any hour. Please call [phone]  for any of the following symptoms:  Following lower endoscopy (colonoscopy, flexible sigmoidoscopy) Excessive amounts of blood in the stool  Significant tenderness, worsening of abdominal pains  Swelling of the abdomen that is new, acute  Fever of 100 or higher  Following upper endoscopy (EGD, EUS, ERCP, esophageal dilation) Vomiting of blood or coffee ground material  New, significant abdominal pain  New, significant chest pain or pain under the shoulder blades  Painful or persistently difficult swallowing  New shortness of breath  Black, tarry-looking or red, bloody stools  FOLLOW UP:  If any biopsies were taken you will be contacted by phone or by letter within the next 1-3 weeks. Call [phone]  if you have not heard about the biopsies in 3 weeks.  Please also call with any specific questions about appointments or follow up tests.YOU HAD AN ENDOSCOPIC PROCEDURE TODAY: Refer to the procedure report and other information in the discharge instructions given to you for any specific questions about what was found during the examination. If this information does not answer your questions, please call [place] office at [phone] to clarify.   YOU  SHOULD EXPECT: Some feelings of bloating in the abdomen. Passage of more gas than usual. Walking can help get rid of the air that was put into your GI tract during the procedure and reduce the bloating. If you had a lower endoscopy (such as a colonoscopy or flexible sigmoidoscopy) you may notice spotting of blood in your  stool or on the toilet paper. Some abdominal soreness may be present for a day or two, also.  DIET: Your first meal following the procedure should be a light meal and then it is ok to progress to your normal diet. A half-sandwich or bowl of soup is an example of a good first meal. Heavy or fried foods are harder to digest and may make you feel nauseous or bloated. Drink plenty of fluids but you should avoid alcoholic beverages for 24 hours. If you had a esophageal dilation, please see attached instructions for diet.    ACTIVITY: Your care partner should take you home directly after the procedure. You should plan to take it easy, moving slowly for the rest of the day. You can resume normal activity the day after the procedure however YOU SHOULD NOT DRIVE, use power tools, machinery or perform tasks that involve climbing or major physical exertion for 24 hours (because of the sedation medicines used during the test).   SYMPTOMS TO REPORT IMMEDIATELY: A gastroenterologist can be reached at any hour. Please call [phone]  for any of the following symptoms:  Following lower endoscopy (colonoscopy, flexible sigmoidoscopy) Excessive amounts of blood in the stool  Significant tenderness, worsening of abdominal pains  Swelling of the abdomen that is new, acute  Fever of 100 or higher  Following upper endoscopy (EGD, EUS, ERCP, esophageal dilation) Vomiting of blood or coffee ground material  New, significant abdominal pain  New, significant chest pain or pain under the shoulder blades  Painful or persistently difficult swallowing  New shortness of breath  Black, tarry-looking or red, bloody stools  FOLLOW UP:  If any biopsies were taken you will be contacted by phone or by letter within the next 1-3 weeks. Call [phone]  if you have not heard about the biopsies in 3 weeks.  Please also call with any specific questions about appointments or follow up tests.

## 2022-10-02 NOTE — Progress Notes (Signed)
Report given to PACU, vss 

## 2022-10-02 NOTE — Op Note (Addendum)
[facility] Patient Name: Sean Gardner Procedure Date: 10/02/2022 11:04 AM MRN: [id number] Endoscopist: Linton Hall. Loletha Carrow , MD, [id number] Age: 66 Referring MD:  Date of Birth: 09/22/1956 Gender: Male Account #: [id number] Procedure:                Colonoscopy Indications:              Unexplained iron deficiency anemia                           also diminutive TA in 2019 (outside clinic)                           Large rectal TVA with HGD removed 2014 (different                            outside clinic) - clinical details in 08/19/22                            office consult note Medicines:                Monitored Anesthesia Care Procedure:                Pre-Anesthesia Assessment:                           - Prior to the procedure, a History and Physical                            was performed, and patient medications and                            allergies were reviewed. The patient's tolerance of                            previous anesthesia was also reviewed. The risks                            and benefits of the procedure and the sedation                            options and risks were discussed with the patient.                            All questions were answered, and informed consent                            was obtained. Prior Anticoagulants: The patient has                            taken no anticoagulant or antiplatelet agents. ASA                            Grade Assessment: III - A patient with severe  systemic disease. After reviewing the risks and                            benefits, the patient was deemed in satisfactory                            condition to undergo the procedure.                           After obtaining informed consent, the colonoscope                            was passed under direct vision. Throughout the                            procedure, the patient's blood pressure, pulse, and                             oxygen saturations were monitored continuously. The                            CF [id number] #[id number] was introduced through the anus                            and advanced to the the cecum, identified by                            appendiceal orifice and ileocecal valve. The                            colonoscopy was performed with difficulty due to a                            redundant colon, significant looping and the                            patient's body habitus. The patient tolerated the                            procedure fairly well. The quality of the bowel                            preparation was good. The ileocecal valve,                            appendiceal orifice, and rectum were photographed.                            The bowel preparation used was SUPREP via split                            dose instruction. Scope In: 11:40:13 AM Scope Out: 12:07:14 PM Scope Withdrawal Time: 0 hours 22 minutes 25 seconds  Total Procedure Duration: 0 hours  27 minutes 1 second  Findings:                 The perianal and digital rectal examinations were                            normal.                           The colon (entire examined portion) was redundant.                           Repeat examination of right colon under NBI                            performed.                           Four sessile polyps were found in the transverse                            colon and ileocecal valve. The polyps were 6 to 8                            mm in size. These polyps were removed with a cold                            snare. Resection and retrieval were complete.                           Two sessile polyps were found in the transverse                            colon. The polyps were 8 to 12 mm in size. These                            polyps were removed with a cold snare. Resection                            and retrieval were complete.                           Two  sessile polyps were found in the descending                            colon. The polyps were 2 to 6 mm in size. These                            polyps were removed with a cold snare. Resection                            and retrieval were complete.                           Internal hemorrhoids were found.  A tattoo was seen in the rectum. A post-polypectomy                            scar was found at the tattoo site. No polyp tissue                            seen in that area.                           The exam was otherwise without abnormality on                            direct and retroflexion views. Complications:            No immediate complications. Estimated Blood Loss:     Estimated blood loss was minimal. Impression:               - Redundant colon.                           - Four 6 to 8 mm polyps in the transverse colon and                            at the ileocecal valve, removed with a cold snare.                            Resected and retrieved.                           - Two 8 to 12 mm polyps in the transverse colon,                            removed with a cold snare. Resected and retrieved.                           - Two 2 to 6 mm polyps in the descending colon,                            removed with a cold snare. Resected and retrieved.                           - Internal hemorrhoids.                           - A tattoo was seen in the rectum. A                            post-polypectomy scar was found at the tattoo site.                           - The examination was otherwise normal on direct                            and retroflexion views. Recommendation:           -  Patient has a contact number available for                            emergencies. The signs and symptoms of potential                            delayed complications were discussed with the                            patient. Return to normal activities  tomorrow.                            Written discharge instructions were provided to the                            patient.                           - Resume previous diet.                           - Continue present medications.                           - Await pathology results.                           - Repeat colonoscopy is recommended for                            surveillance. The colonoscopy date will be                            determined after pathology results from today's                            exam become available for review.                           - See the other procedure note for documentation of                            additional recommendations. Jourdyn Ferrin L. Loletha Carrow, MD 10/02/2022 12:27:41 PM This report has been signed electronically.

## 2022-10-02 NOTE — Progress Notes (Signed)
Called to room to assist during endoscopic procedure.  Patient ID and intended procedure confirmed with present staff. Received instructions for my participation in the procedure from the performing physician.  

## 2022-10-02 NOTE — Progress Notes (Deleted)
History and Physical:  This patient presents for endoscopic testing for: Encounter Diagnoses  Name Primary?   Iron deficiency anemia, unspecified iron deficiency anemia type Yes   Personal history of colonic polyps     Clinical details in 08/19/2022 [facility] office consult note.  Iron deficiency anemia, personal history of colon polyps.  Patient reports no clinical changes since that visit.  Patient is otherwise without complaints or active issues today.   Past Medical History: Past Medical History:  Diagnosis Date   Adenomatous colon polyp    Claudication in peripheral vascular disease ([facility])    GERD (gastroesophageal reflux disease)    Hyperlipidemia    Hypertension    Lung cancer ([facility])    neuroendocrine lung ca dx 2010   Myocardial infarction [facility]) 07/2000   Peripheral vascular disease ([facility])    Shortness of breath    on exertion,can't lay on his back   Sleep apnea    cpap     Past Surgical History: Past Surgical History:  Procedure Laterality Date   ACHILLES TENDON SURGERY Left    ANTERIOR HIP REVISION Right 11/13/2020   Procedure: IRRIGATION AND DEBRIDEMENT RIGHT HIP;  Surgeon: Leandrew Koyanagi, MD;  Location: [facility];  Service: Orthopedics;  Laterality: Right;   CORONARY ARTERY BYPASS GRAFT  08/18/2000   LIMA--dLIMA, RIMA-dRCA, left RA-OM2, SVG-D2   FLEXIBLE SIGMOIDOSCOPY N/A 01/18/2013   Procedure: FLEXIBLE SIGMOIDOSCOPY;  Surgeon: Arta Silence, MD;  Location: [facility];  Service: Endoscopy;  Laterality: N/A;   HOT HEMOSTASIS N/A 01/18/2013   Procedure: HOT HEMOSTASIS (ARGON PLASMA COAGULATION/BICAP);  Surgeon: Arta Silence, MD;  Location: [facility];  Service: Endoscopy;  Laterality: N/A;   INCISION AND DRAINAGE HIP Right 12/05/2020   Procedure: IRRIGATION AND DEBRIDEMENT RIGHT HIP;  Surgeon: Leandrew Koyanagi, MD;  Location: [facility];  Service: Orthopedics;  Laterality: Right;   INGUINAL HERNIA REPAIR Bilateral    inguinal   LEFT HEART CATH AND CORS/GRAFTS  ANGIOGRAPHY N/A 07/21/2022   Procedure: LEFT HEART CATH AND CORS/GRAFTS ANGIOGRAPHY;  Surgeon: Adrian Prows, MD;  Location: [facility];  Service: Cardiovascular;  Laterality: N/A;   LUNG SURGERY Left 01/29/2009   left lower lobectomy    TOTAL HIP ARTHROPLASTY Right 09/16/2020   Procedure: RIGHT TOTAL HIP ARTHROPLASTY ANTERIOR APPROACH;  Surgeon: Leandrew Koyanagi, MD;  Location: [facility];  Service: Orthopedics;  Laterality: Right;    Allergies: Allergies  Allergen Reactions   Lisinopril Other (See Comments)    Hyperkalemia   Zolpidem Tartrate Other (See Comments)    Felt fuzzy and hallucinations    Outpatient Meds: Current Outpatient Medications  Medication Sig Dispense Refill   amLODipine (NORVASC) 10 MG tablet Take 1 tablet (10 mg total) by mouth daily. 90 tablet 1   aspirin EC 81 MG tablet Take 81 mg by mouth daily. Swallow whole.     Cholecalciferol (VITAMIN D3) 25 MCG (1000 UT) CAPS Take 1 capsule by mouth daily.     diphenhydramine-acetaminophen (TYLENOL PM) 25-500 MG TABS tablet Take 2 tablets by mouth at bedtime as needed (sleep).     ezetimibe (ZETIA) 10 MG tablet Take 1 tablet (10 mg total) by mouth daily after supper. 90 tablet 3   ferrous sulfate 325 (65 FE) MG tablet Take 325 mg by mouth every Monday, Wednesday, and Friday.     hydrALAZINE (APRESOLINE) 50 MG tablet Take 1 tablet (50 mg total) by mouth 3 (three) times daily. 270 tablet 3   metoprolol (LOPRESSOR) 50 MG tablet  Take 50 mg by mouth 2 (two) times daily. Hold if systolic blood pressure (top blood pressure number) less than 100 mmHg or heart rate less than 60 bpm (pulse).     rosuvastatin (CRESTOR) 40 MG tablet TAKE 1 TABLET BY MOUTH EVERYDAY AT BEDTIME 90 tablet 1   tiotropium (SPIRIVA) 18 MCG inhalation capsule Place 18 mcg into inhaler and inhale daily.     vitamin B-12 (CYANOCOBALAMIN) 500 MCG tablet Take 1,000 mcg by mouth daily.     fluorouracil (EFUDEX) 5 % cream APPLY SUFFICIENT AMOUNT TO AFFECTED AREA  TWICE A DAY TO THE ARMS AND HANDS TWICE DAILY FOR 21 DAYS THEN DO THE SIDES OF  THE FACE TWICE DAILY FOR 10 DAYS TO THE ARMS AND HANDS TWICE DAILY FOR 21 DAYS THEN DO THE SIDES OF   THE FACE TWICE DAILY FOR 10 DAYS (Patient not taking: Reported on 08/19/2022)     omeprazole (PRILOSEC) 20 MG capsule Take 20 mg by mouth 2 (two) times daily before a meal. (Patient not taking: Reported on 10/02/2022)     Current Facility-Administered Medications  Medication Dose Route Frequency Provider Last Rate Last Admin   0.9 %  sodium chloride infusion  500 mL Intravenous Once Danis, Kirke Corin, MD          ___________________________________________________________________ Objective   Exam:  BP 135/72   Pulse 76   Temp 98.2 F (36.8 C) (Skin)   Ht 5\' 10"  (1.778 m)   Wt 286 lb (129.7 kg)   SpO2 98%   BMI 41.04 kg/m   CV: regular , S1/S2 Resp: clear to auscultation bilaterally, normal RR and effort noted GI: soft, no tenderness, with active bowel sounds.   Assessment: Encounter Diagnoses  Name Primary?   Iron deficiency anemia, unspecified iron deficiency anemia type Yes   Personal history of colonic polyps      Plan: Colonoscopy EGD   The benefits and risks of the planned procedure were described in detail with the patient or (when appropriate) their health care proxy.  Risks were outlined as including, but not limited to, bleeding, infection, perforation, adverse medication reaction leading to cardiac or pulmonary decompensation, pancreatitis (if ERCP).  The limitation of incomplete mucosal visualization was also discussed.  No guarantees or warranties were given.    The patient is appropriate for an endoscopic procedure in the ambulatory setting.   - Wilfrid Lund, MD

## 2022-10-02 NOTE — Progress Notes (Signed)
VS by DT  Pt's states no medical or surgical changes since previsit or office visit.  

## 2022-10-02 NOTE — Progress Notes (Signed)
1137 Robinul 0.1 mg IV given due large amount of secretions upon assessment.  MD made aware, vss

## 2022-10-02 NOTE — Addendum Note (Signed)
Addended by: Samantha Crimes on: 10/02/2022 03:25 PM   Modules accepted: Orders

## 2022-10-05 ENCOUNTER — Telehealth: Payer: Self-pay

## 2022-10-05 NOTE — Telephone Encounter (Signed)
  Follow up Call-     10/02/2022   10:25 AM  Call back number  Post procedure Call Back phone  # [phone]  Permission to leave phone message Yes     Patient questions:  Do you have a fever, pain , or abdominal swelling? No. Pain Score  0 *  Have you tolerated food without any problems? Yes.    Have you been able to return to your normal activities? Yes.    Do you have any questions about your discharge instructions: Diet   No. Medications  No. Follow up visit  No.  Do you have questions or concerns about your Care? No.  Actions: * If pain score is 4 or above: No action needed, pain <4.

## 2022-10-09 ENCOUNTER — Encounter: Payer: Self-pay | Admitting: Gastroenterology

## 2022-10-24 ENCOUNTER — Other Ambulatory Visit: Payer: Self-pay | Admitting: Gastroenterology

## 2022-10-24 DIAGNOSIS — K269 Duodenal ulcer, unspecified as acute or chronic, without hemorrhage or perforation: Secondary | ICD-10-CM

## 2022-12-08 ENCOUNTER — Ambulatory Visit: Payer: Non-veteran care | Admitting: Gastroenterology

## 2022-12-08 NOTE — Progress Notes (Shared)
[facility] Progress Note  Chief Complaint:  No chief complaint on file.   Subjective  History: I last saw Mr. Buzby on 08/19/2022. He was referred from the [place] for heme positive stool and iron deficiency anemia. He has been on Omeprazole for many years but he could not recall the original indication.He was on ASA for CAD. He had a recent cardiac cath with no intervention required for his multifactorial dyspnea. EGD and colonoscopy done on 10/02/2022. He had a large rectal TVA with HGD removed at [facility]. Additional details on 08/19/2022 office consult note. On the most recent colonoscopy he had 8 adenomatous polyps and a tattoo in rectum from prior polypectomy site with no recurrent polyp there. He had an EGD same day and had multiple superficial duodenal ulcers with pigmented sigmoidal bleeding. Biopsies were negative for celiac sprue and H pylori and I started him on Omeprazole 40mg  BID and recommended consultation with cardiologist for possible change from ASA to Plavix as well as follow up with PCP to follow his HGB and iron levels. It does not appear that he has yet seen his cardiologist Dr. Einar Gip for follow up.   ***    ROS: Review of Systems   The patient's Past Medical, Family and Social History were reviewed and are on file in the EMR.  Objective:  Med list reviewed  Current Outpatient Medications:    amLODipine (NORVASC) 10 MG tablet, Take 1 tablet (10 mg total) by mouth daily., Disp: 90 tablet, Rfl: 1   aspirin EC 81 MG tablet, Take 81 mg by mouth daily. Swallow whole., Disp: , Rfl:    Cholecalciferol (VITAMIN D3) 25 MCG (1000 UT) CAPS, Take 1 capsule by mouth daily., Disp: , Rfl:    diphenhydramine-acetaminophen (TYLENOL PM) 25-500 MG TABS tablet, Take 2 tablets by mouth at bedtime as needed (sleep)., Disp: , Rfl:    ezetimibe (ZETIA) 10 MG tablet, Take 1 tablet (10 mg total) by mouth daily after supper., Disp: 90 tablet, Rfl: 3   ferrous sulfate 325 (65 FE) MG  tablet, Take 325 mg by mouth every Monday, Wednesday, and Friday., Disp: , Rfl:    fluorouracil (EFUDEX) 5 % cream, APPLY SUFFICIENT AMOUNT TO AFFECTED AREA TWICE A DAY TO THE ARMS AND HANDS TWICE DAILY FOR 21 DAYS THEN DO THE SIDES OF  THE FACE TWICE DAILY FOR 10 DAYS TO THE ARMS AND HANDS TWICE DAILY FOR 21 DAYS THEN DO THE SIDES OF   THE FACE TWICE DAILY FOR 10 DAYS (Patient not taking: Reported on 08/19/2022), Disp: , Rfl:    hydrALAZINE (APRESOLINE) 50 MG tablet, Take 1 tablet (50 mg total) by mouth 3 (three) times daily., Disp: 270 tablet, Rfl: 3   metoprolol (LOPRESSOR) 50 MG tablet, Take 50 mg by mouth 2 (two) times daily. Hold if systolic blood pressure (top blood pressure number) less than 100 mmHg or heart rate less than 60 bpm (pulse)., Disp: , Rfl:    omeprazole (PRILOSEC) 40 MG capsule, TAKE 1 CAPSULE BY MOUTH TWICE A DAY, Disp: 180 capsule, Rfl: 1   rosuvastatin (CRESTOR) 40 MG tablet, TAKE 1 TABLET BY MOUTH EVERYDAY AT BEDTIME, Disp: 90 tablet, Rfl: 1   tiotropium (SPIRIVA) 18 MCG inhalation capsule, Place 18 mcg into inhaler and inhale daily., Disp: , Rfl:    vitamin B-12 (CYANOCOBALAMIN) 500 MCG tablet, Take 1,000 mcg by mouth daily., Disp: , Rfl:    Vital signs in last 24 hrs: There were no vitals filed  for this visit. Wt Readings from Last 3 Encounters:  10/02/22 286 lb (129.7 kg)  08/19/22 286 lb 2 oz (129.8 kg)  07/29/22 287 lb 3.2 oz (130.3 kg)     Physical Exam General: well-appearing *** HEENT: sclera anicteric, oral mucosa moist without lesions Neck: supple, no thyromegaly, JVD or lymphadenopathy Cardiac: ***,  no peripheral edema Pulm: clear to auscultation bilaterally, normal RR and effort noted Abdomen: soft, *** tenderness, with active bowel sounds. No guarding or palpable hepatosplenomegaly. Skin: warm and dry, no jaundice or rash  Labs:   ___________________________________________ Radiologic  studies:   ____________________________________________ Other:   _____________________________________________ Assessment & Plan   Assessment: No diagnosis found.  ***   Plan: ***   *** minutes were spent on this encounter (including chart review, history/exam, counseling/coordination of care, and documentation) > 50% of that time was spent on counseling and coordination of care.    I,Alexis Herring,acting as a Education administrator for [place], MD.,have documented all relevant documentation on the behalf of Doran Stabler, MD,as directed by  Doran Stabler, MD while in the presence of Doran Stabler, MD.   Eugene Gavia

## 2022-12-09 ENCOUNTER — Telehealth: Payer: Self-pay | Admitting: Gastroenterology

## 2022-12-09 ENCOUNTER — Telehealth: Payer: Self-pay

## 2022-12-09 DIAGNOSIS — I2510 Atherosclerotic heart disease of native coronary artery without angina pectoris: Secondary | ICD-10-CM

## 2022-12-09 DIAGNOSIS — I739 Peripheral vascular disease, unspecified: Secondary | ICD-10-CM

## 2022-12-09 MED ORDER — CLOPIDOGREL BISULFATE 75 MG PO TABS: 75.0000 mg | ORAL_TABLET | Freq: Every day | ORAL | 3 refills | Status: DC

## 2022-12-09 NOTE — Telephone Encounter (Signed)
Inbound call from [place] she work for cardiovascular department and she is having some questing regarding patient medication..."Aspirin" if you can please give her at call back her phone # [phone].Thanks

## 2022-12-09 NOTE — Telephone Encounter (Signed)
Spoke with patients GI office. The recommendation is that the patient start Plavix, as they think his ulcer was caused by the aspirin (it was negative for H. Pylori which leads them to think it was the aspirin). Do you want him to start the Plavix? He has not taken aspirin since December.

## 2022-12-09 NOTE — Telephone Encounter (Signed)
Patient stopped taking his aspirin before he had his colonoscopy in December. He hasn't taken it since. He says the gastro doctor told him not to take it anymore, and is asking what we replaced it with. He is very confused about what is going on, I called the GI office for clarification and am waiting for his CMA to return my call.

## 2022-12-09 NOTE — Telephone Encounter (Signed)
I reviewed Morgan's note in epic. I returned call to [facility], LPN and she informed me that patient has not been taking Aspirin since his procedure. I informed Lilia Pro that we advised patient to discuss with cardiologist regarding medication change from Aspirin to Plavix. Per 10/03/23 report if H. Pylori was negative then ulcer was likely caused by Aspirin. Dr. Loletha Carrow also noted on pathology letter that he reached out to patient's cardiologist. I informed Lilia Pro that records and recommendations are available in Epic for the providers review. Lilia Pro had no other concerns at the end of the call.

## 2023-01-26 ENCOUNTER — Ambulatory Visit (INDEPENDENT_AMBULATORY_CARE_PROVIDER_SITE_OTHER): Payer: No Typology Code available for payment source | Admitting: Gastroenterology

## 2023-01-26 ENCOUNTER — Encounter: Payer: Self-pay | Admitting: Gastroenterology

## 2023-01-26 VITALS — BP 120/64 | HR 64 | Ht 70.5 in | Wt 292.1 lb

## 2023-01-26 DIAGNOSIS — D500 Iron deficiency anemia secondary to blood loss (chronic): Secondary | ICD-10-CM | POA: Diagnosis not present

## 2023-01-26 DIAGNOSIS — K257 Chronic gastric ulcer without hemorrhage or perforation: Secondary | ICD-10-CM | POA: Diagnosis not present

## 2023-01-26 DIAGNOSIS — K269 Duodenal ulcer, unspecified as acute or chronic, without hemorrhage or perforation: Secondary | ICD-10-CM | POA: Diagnosis not present

## 2023-01-26 DIAGNOSIS — R195 Other fecal abnormalities: Secondary | ICD-10-CM | POA: Diagnosis not present

## 2023-01-26 NOTE — Progress Notes (Signed)
[facility] Progress Note  Chief Complaint: Heme positive stool, IDA, gastroduodenal ulcers  Subjective  History: Extensive 08/19/2022 office consult note with me details patient's cardiovascular history and referral for iron deficiency anemia. EGD and colonoscopy 10/02/2022. Colonoscopy removed 8 adenomatous colon polyps, recall colonoscopy 3 years.  (Diminutive adenomatous polyp in 2019 at outside clinic and large rectal TVA removed at outside clinic in 2014) EGD revealed multiple shallow gastric and duodenal ulcers.  At least one DU had flat black stigmata of bleeding.  H. pylori negative on biopsies (see pathology results below) Prescription sent for 5871-month supply of omeprazole 40 mg twice daily. I sent these reports to the patient's cardiologist with a note requesting consideration of change from aspirin to Plavix since the aspirin was the most likely culprit for ulcers and therefore anemia. Chart note mid February from cardiology clinic indicates patient may have stopped taking his aspirin after the procedure and had not seen cardiology since then.  (Patient has limited health literacy) _________________________________   Sean Gardner sees me in follow-up for the first time since his procedures.  As before, he was unaccompanied and has limited health literacy, and says he finds it difficult to keep track of all his visits and medicines both between outside care and [facility].  Our staff called his wife on the phone today to help clarify some medicines.  With that, we learned that he stopped his aspirin sometime shortly after his procedure with me, and although he believes he saw his cardiologist after the endoscopy, there are no chart notes in epic to indicate that.  When asked if he had changed from aspirin to Plavix, his wife states that he has actually been on Plavix for years, something that was not listed on his medicine list during the November visit with me.  He also believes he is  still taking iron 3 times a week.  And although the prescription for omeprazole that I gave him in December was for 1971-month supply, his wife says it was filled in January 8 and that he is still taking it twice a day. Final showed me [facility], and he has one with [facility] clinic on 02/03/2023.  He denies abdominal pain, hematemesis or black tarry stool.  He has noticed his stool dark as long as he has been on iron.  ROS: Cardiovascular:  no chest pain Respiratory: no dyspnea  The patient's Past Medical, Family and Social History were reviewed and are on file in the EMR.  Objective:  Med list reviewed  Current Outpatient Medications:    amLODipine (NORVASC) 10 MG tablet, Take 1 tablet (10 mg total) by mouth daily., Disp: 90 tablet, Rfl: 1   Carboxymethylcellulose Sod PF 0.5 % SOLN, Apply 1 drop to eye in the morning, at noon, and at bedtime., Disp: , Rfl:    Carboxymethylcellulose Sodium 1 % GEL, Apply 1 drop to eye at bedtime as needed., Disp: , Rfl:    Cholecalciferol (VITAMIN D3) 25 MCG (1000 UT) CAPS, Take 1 capsule by mouth daily., Disp: , Rfl:    clopidogrel (PLAVIX) 75 MG tablet, Take 1 tablet (75 mg total) by mouth daily., Disp: 90 tablet, Rfl: 3   ezetimibe (ZETIA) 10 MG tablet, Take 1 tablet (10 mg total) by mouth daily after supper., Disp: 90 tablet, Rfl: 3   ferrous sulfate 325 (65 FE) MG tablet, Take 325 mg by mouth every Monday, Wednesday, and Friday., Disp: , Rfl:  fluorouracil (EFUDEX) 5 % cream, , Disp: , Rfl:    hydrALAZINE (APRESOLINE) 50 MG tablet, Take 1 tablet (50 mg total) by mouth 3 (three) times daily., Disp: 270 tablet, Rfl: 3   metoprolol (LOPRESSOR) 50 MG tablet, Take 50 mg by mouth 2 (two) times daily. Hold if systolic blood pressure (top blood pressure number) less than 100 mmHg or heart rate less than 60 bpm (pulse)., Disp: , Rfl:    omeprazole (PRILOSEC) 40 MG capsule, TAKE 1 CAPSULE BY MOUTH TWICE A DAY, Disp: 180 capsule, Rfl: 1    rosuvastatin (CRESTOR) 40 MG tablet, TAKE 1 TABLET BY MOUTH EVERYDAY AT BEDTIME, Disp: 90 tablet, Rfl: 1   tiotropium (SPIRIVA) 18 MCG inhalation capsule, Place 18 mcg into inhaler and inhale daily., Disp: , Rfl:    vitamin B-12 (CYANOCOBALAMIN) 500 MCG tablet, Take 1,000 mcg by mouth daily., Disp: , Rfl:    aspirin EC 81 MG tablet, Take 81 mg by mouth daily. Swallow whole. (Patient not taking: Reported on 01/26/2023), Disp: , Rfl:    diphenhydramine-acetaminophen (TYLENOL PM) 25-500 MG TABS tablet, Take 2 tablets by mouth at bedtime as needed (sleep). (Patient not taking: Reported on 01/26/2023), Disp: , Rfl:    Vital signs in last 24 hrs: Vitals:   01/26/23 1031  BP: 120/64  Pulse: 64   Wt Readings from Last 3 Encounters:  01/26/23 292 lb 2 oz (132.5 kg)  10/02/22 286 lb (129.7 kg)  08/19/22 286 lb 2 oz (129.8 kg)    Physical Exam  Well-appearing HEENT: sclera anicteric, oral mucosa moist without lesions.  Healing lesion on lower lip after recent skin cancer removal  Cardiac: Regular without appreciable murmur,  no peripheral edema Pulm: clear to auscultation bilaterally, normal RR and effort noted Abdomen: soft, no tenderness, with active bowel sounds.  Abdominal girth limits evaluation for mass hepatosplenomegaly  Labs:   ___________________________________________ Radiologic studies:   ____________________________________________ Other:  1. Surgical [P], colon, transverse and ileocecal valve, polyp (4) - TUBULAR ADENOMA, FRAGMENTS. 2. Surgical [P], colon, transverse, polyp (2) - TUBULAR ADENOMA, FRAGMENTS. 3. Surgical [P], colon, descending, polyp (2) - TUBULAR ADENOMA, FRAGMENTS. 4. Surgical [P], 2nd portion of duodenum - DUODENAL MUCOSA WITH PROMINENT BRUNNER'S GLANDS, OTHERWISE NO SIGNIFICANT PATHOLOGY. 5. Surgical [P], gastric antrum and gastric body - ANTRAL AND OXYNTIC MUCOSA WITH NO SIGNIFICANT PATHOLOGY. - NO HELICOBACTER PYLORI ORGANISMS IDENTIFIED ON H&E  STAINED SLIDE. Orene Desanctis DO Pathologist, Electronic Signature (Case signed 10/07/2022) _____________________________________________ Assessment & Plan  Assessment: Encounter Diagnoses  Name Primary?   Duodenal ulcer disease Yes   Chronic gastric ulcer without hemorrhage and without perforation    Iron deficiency anemia due to chronic blood loss    Heme positive stool    67 year old man with multiple medical issues referred from [place] last fall for heme positive stool and iron deficiency anemia.  He was sent to [name] primarily for endoscopic evaluation given limited availability of those services at his [place] clinic.  Multiple adenomatous colon polyps encountered, 3-year recall recommended. Multiple shallow gastroduodenal ulcers to explain blood loss and iron deficiency anemia, H. pylori negative.  This indicates aspirin was the culprit, and he has since discontinued that.  With that, and having been on twice daily PPI since then, I believe his ulcers will have healed by now.  As such, and considering his increased sedation risk of endoscopic procedures, I do not feel he needs another upper endoscopy at this point.  As before, care delivery is challenging given multiple  medical issues, fragmented care and limited health literacy.  He was given copies of his endoscopic procedure reports that day, and he believes his wife asked about home.  I strongly recommended he bring them to his upcoming [facility] appointment.  I also wrote down on his appointment reminder sheet that the [facility] GI provider should check his CBC and iron levels to determine when he can stop taking iron.  I also wrote down that he can stop the omeprazole after the current month supply runs out.  Those issues can be attended to by [facility] clinic and the patient referred back to [name] if future endoscopic care is felt to be needed.  35 minutes were spent on this encounter (including chart review, history/exam,  counseling/coordination of care, and documentation) > 50% of that time was spent on counseling and coordination of care.   Sean Gardner  CC: Dr. Sherilyn Banker

## 2023-01-26 NOTE — Patient Instructions (Signed)
_______________________________________________________  If your blood pressure at your visit was 140/90 or greater, please contact your primary care physician to follow up on this.  _______________________________________________________  If you are age 69 or older, your body mass index should be between 23-30. Your Body mass index is 41.32 kg/m. If this is out of the aforementioned range listed, please consider follow up with your Primary Care Provider.  If you are age 1 or younger, your body mass index should be between 19-25. Your Body mass index is 41.32 kg/m. If this is out of the aformentioned range listed, please consider follow up with your Primary Care Provider.   ________________________________________________________  The [facility] providers would like to encourage you to use [facility] to communicate with providers for non-urgent requests or questions.  Due to long hold times on the telephone, sending your provider a message by [facility] may be a faster and more efficient way to get a response.  Please allow 48 business hours for a response.  Please remember that this is for non-urgent requests.  _______________________________________________________  It was a pleasure to see you today!  Thank you for trusting me with your gastrointestinal care!

## 2023-07-07 ENCOUNTER — Other Ambulatory Visit: Payer: Self-pay

## 2023-07-07 DIAGNOSIS — I739 Peripheral vascular disease, unspecified: Secondary | ICD-10-CM

## 2023-07-07 DIAGNOSIS — I2510 Atherosclerotic heart disease of native coronary artery without angina pectoris: Secondary | ICD-10-CM

## 2023-07-07 MED ORDER — CLOPIDOGREL BISULFATE 75 MG PO TABS: 75.0000 mg | ORAL_TABLET | Freq: Every day | ORAL | 3 refills | Status: DC

## 2023-07-12 ENCOUNTER — Encounter: Payer: Self-pay | Admitting: Cardiology

## 2023-07-29 ENCOUNTER — Ambulatory Visit: Payer: Self-pay | Admitting: Cardiology

## 2023-09-06 ENCOUNTER — Ambulatory Visit (HOSPITAL_BASED_OUTPATIENT_CLINIC_OR_DEPARTMENT_OTHER): Payer: Medicare Other | Admitting: Family

## 2023-09-06 ENCOUNTER — Encounter (HOSPITAL_BASED_OUTPATIENT_CLINIC_OR_DEPARTMENT_OTHER): Payer: Self-pay | Admitting: Family

## 2023-09-06 VITALS — BP 108/68 | HR 70 | Ht 70.5 in | Wt 285.1 lb

## 2023-09-06 DIAGNOSIS — E785 Hyperlipidemia, unspecified: Secondary | ICD-10-CM | POA: Diagnosis not present

## 2023-09-06 DIAGNOSIS — I739 Peripheral vascular disease, unspecified: Secondary | ICD-10-CM

## 2023-09-06 DIAGNOSIS — I4510 Unspecified right bundle-branch block: Secondary | ICD-10-CM | POA: Diagnosis not present

## 2023-09-06 DIAGNOSIS — I25118 Atherosclerotic heart disease of native coronary artery with other forms of angina pectoris: Secondary | ICD-10-CM

## 2023-09-06 DIAGNOSIS — I10 Essential (primary) hypertension: Secondary | ICD-10-CM | POA: Diagnosis not present

## 2023-09-06 NOTE — Progress Notes (Signed)
Cardiology Office Note:  .   Date:  09/06/2023  ID:  Seleta Rhymes, DOB 1956-01-23, MRN [id number] PCP: Tally Joe, MD  [facility] Providers Cardiologist:  Yates Decamp, MD    History of Present Illness: .   Sean Gardner is a 67 y.o. male with a history of the s/p CABG in 2001, obesity, RBBB, OSA on CPAP, hyperlipidemia, hypertension, prior tobacco use, lung cancer s/p chemotherapy and left lower lobe lobectomy, PAD (right calf greater than left calf)  Prior abnormal ABI  10/2019 bilaterally suggestive of peripheral vascular disease with moderately decreased perfusion of the RLE with posttibial artery level ABI 0.67 and moderately decreased perfusion of the left lower extremity with posttibial artery ABI 0.73  Echo 05/2022 normal LVEF 55 to 60%, grade 1 diastolic dysfunction, LA a moderately dilated, lipomatous septum, trace TR.  Myoview 05/2022 abnormal.  Subsequent LHC 07/2022 revealed occluded SVG graft to moderate-sized D1.  As LVEF was preserved and overall low risk recommended for medical management.  Last seen 07/29/2022 by Dr. Jacinto Halim doing overall well from a cardiac perspective.  Presents today for follow up with his wife. Exercising by participating in bowling league and hopeful to add back more golf. No chest pain, pressure, tightness. Reports exertional dyspnea particularly if he moves quickly. This is overall unchanged. Wearing CPAP regularly. [facility] with [facility] in [facility] pain, hopeful this will improve his mobility. No edema, orthopnea, PND. Reports no palpitations.    ROS: Please see the history of present illness.    All other systems reviewed and are negative.   Studies Reviewed: Marland Kitchen   EKG Interpretation Date/Time:  Monday September 06 2023 13:28:11 EST Ventricular Rate:  65 PR Interval:  184 QRS Duration:  142 QT Interval:  432 QTC Calculation: 449 R Axis:   -28  Text Interpretation: Normal sinus rhythm Right  bundle branch block  Stable inferolateral TWI compared to prior. Confirmed by Gillian Shields ([id number]) on 09/06/2023 1:32:48 PM   Left Heart Catheterization 07/21/22:  LV: 144/6, EDP 21 mmHg.  Ao 148/65, mean 98 mmHg.  No pressure gradient across the aortic valve.  EDP moderately elevated. LVEF: 55 to 60%. LM: Widely patent. LAD: Occluded in the proximal segment.  Very small diagonals.  D1 is occluded.  Mid to distal LAD supplied by LIMA to LAD.  D1 is supplied by SVG, it is occluded. CX: Moderate caliber vessel, OM1 is moderate to large, has mild diffuse disease.  OM 2 is occluded in the proximal segment.  Distal circumflex is diffusely diseased.  OM 2 is supplied by free radial graft.  Graft is patent. RCA: Very large caliber vessel and a superdominant vessel.  Mild disease is noted.  RIMA to RCA is functionally occluded.   LIMA to LAD: Patent, SVG to D1: Occluded, free radial graft to OM 2 patent, RIMA to RCA functionally occluded.   Impression: Abnormal nuclear stress test secondary to occluded SVG to D2.  Overall LVEF is preserved.  Hence overall low risk.  Continue medical management only.  Discontinue Plavix, continue aspirin only.     I  Risk Assessment/Calculations:             Physical Exam:   VS:  BP 108/68   Pulse 70   Ht 5' 10.5" (1.791 m)   Wt 285 lb 1.6 oz (129.3 kg)   SpO2 95%   BMI 40.33 kg/m    Wt Readings from  Last 3 Encounters:  09/06/23 285 lb 1.6 oz (129.3 kg)  01/26/23 292 lb 2 oz (132.5 kg)  10/02/22 286 lb (129.7 kg)    GEN: Well nourished, overweight, well developed in no acute distress NECK: No JVD; No carotid bruits CARDIAC: RRR, no murmurs, rubs, gallops RESPIRATORY:  Clear to auscultation without rales, wheezing or rhonchi  ABDOMEN: Soft, non-tender, non-distended EXTREMITIES:  No edema; No deformity   ASSESSMENT AND PLAN: .    CAD s/p CABG 2001 / HLD, LDL goal <55 - EKG today stable. No chest pain. Anticipate mild exertional dyspnea related to  deconditioning, obesity as overall unchanged. Congratulated on increasing his activity. GDMT includes Plavix (instead of ASA per GI), Rosuvastatin 40mg  daily, Zetia 10mg  daily, Metoprolol tartrate 50mg  BID. Labs today: lipid panel, CBC, CMP LDL goal <55 given history of MI and risk factors of HTN, age. If LDL not at goal <55 consider Nexlizet vs Repatha.  Given information on Right Start exercise program  OSA - CPAP compliance encouraged. Endorses using regularly.   RBBB - Stable finding by EKG. Monitor with periodic EKG.  HTN - Relatively hypotensive but asymptomatic with no lightheadedness. Continue current antihypertensive regimen.    PAD - Abnormal ABI 2021. No claudication symptoms. No indication for repeat imaging at this time.       Dispo: follow up in 1 year  Signed, Alver Sorrow, NP

## 2023-09-06 NOTE — Patient Instructions (Addendum)
Medication Instructions:  Continue your current medications.  *If you need a refill on your cardiac medications before your next appointment, please call your pharmacy*   Lab Work: Your physician recommends that you return for lab work today: CMP, CBC, lipid panel If you have labs (blood work) drawn today and your tests are completely normal, you will receive your results only by: MyChart Message (if you have MyChart) OR A paper copy in the mail If you have any lab test that is abnormal or we need to change your treatment, we will call you to review the results.   Testing/Procedures: Your EKG today was stable which is good!   Follow-Up: At [facility], you and your health needs are our priority.  As part of our continuing mission to provide you with exceptional heart care, we have created designated Provider Care Teams.  These Care Teams include your primary Cardiologist (physician) and Advanced Practice Providers (APPs -  Physician Assistants and Nurse Practitioners) who all work together to provide you with the care you need, when you need it.  We recommend signing up for the patient portal called "MyChart".  Sign up information is provided on this After Visit Summary.  MyChart is used to connect with patients for Virtual Visits (Telemedicine).  Patients are able to view lab/test results, encounter notes, upcoming appointments, etc.  Non-urgent messages can be sent to your provider as well.   To learn more about what you can do with MyChart, go to [email or web address].    Your next appointment:   1 year(s)  Provider:   Yates Decamp, MD     Other Instructions  Heart Healthy Diet Recommendations: A low-salt diet is recommended. Meats should be grilled, baked, or boiled. Avoid fried foods. Focus on lean protein sources like fish or chicken with vegetables and fruits. The American Heart Association is a Chief Technology Officer!  American Heart Association Diet and Lifeystyle  Recommendations   Exercise recommendations: The American Heart Association recommends 150 minutes of moderate intensity exercise weekly. Try 30 minutes of moderate intensity exercise 4-5 times per week. This could include walking, jogging, or swimming.

## 2023-09-07 LAB — COMPREHENSIVE METABOLIC PANEL
ALT: 28 [IU]/L (ref 0–44)
AST: 26 [IU]/L (ref 0–40)
Albumin: 4.2 g/dL (ref 3.9–4.9)
Alkaline Phosphatase: 54 [IU]/L (ref 44–121)
BUN/Creatinine Ratio: 15 (ref 10–24)
BUN: 16 mg/dL (ref 8–27)
Bilirubin Total: 0.6 mg/dL (ref 0.0–1.2)
CO2: 19 mmol/L — ABNORMAL LOW (ref 20–29)
Calcium: 10.3 mg/dL — ABNORMAL HIGH (ref 8.6–10.2)
Chloride: 108 mmol/L — ABNORMAL HIGH (ref 96–106)
Creatinine, Ser: 1.09 mg/dL (ref 0.76–1.27)
Globulin, Total: 2.4 g/dL (ref 1.5–4.5)
Glucose: 85 mg/dL (ref 70–99)
Potassium: 5 mmol/L (ref 3.5–5.2)
Sodium: 142 mmol/L (ref 134–144)
Total Protein: 6.6 g/dL (ref 6.0–8.5)
eGFR: 73 mL/min/{1.73_m2} (ref >59)

## 2023-09-07 LAB — LIPID PANEL
Chol/HDL Ratio: 2.9 ratio (ref 0.0–5.0)
Cholesterol, Total: 111 mg/dL (ref 100–199)
HDL: 38 mg/dL — ABNORMAL LOW (ref >39)
LDL Chol Calc (NIH): 54 mg/dL (ref 0–99)
Triglycerides: 98 mg/dL (ref 0–149)
VLDL Cholesterol Cal: 19 mg/dL (ref 5–40)

## 2023-09-07 LAB — CBC
Hematocrit: 48.2 % (ref 37.5–51.0)
Hemoglobin: 15.7 g/dL (ref 13.0–17.7)
MCH: 32 pg (ref 26.6–33.0)
MCHC: 32.6 g/dL (ref 31.5–35.7)
MCV: 98 fL — ABNORMAL HIGH (ref 79–97)
Platelets: 239 10*3/uL (ref 150–450)
RBC: 4.91 x10E6/uL (ref 4.14–5.80)
RDW: 13.4 % (ref 11.6–15.4)
WBC: 9.6 10*3/uL (ref 3.4–10.8)

## 2024-04-05 ENCOUNTER — Telehealth (INDEPENDENT_AMBULATORY_CARE_PROVIDER_SITE_OTHER): Payer: Self-pay | Admitting: Otolaryngology

## 2024-04-05 NOTE — Telephone Encounter (Signed)
 Patient concerned that his appointment is too far out and wanted you to look over the referral to see if he needed to come in sooner.  Please advise.

## 2024-04-11 ENCOUNTER — Encounter (INDEPENDENT_AMBULATORY_CARE_PROVIDER_SITE_OTHER): Payer: Self-pay | Admitting: Otolaryngology

## 2024-04-11 ENCOUNTER — Ambulatory Visit (INDEPENDENT_AMBULATORY_CARE_PROVIDER_SITE_OTHER): Admitting: Otolaryngology

## 2024-04-11 VITALS — BP 111/67 | HR 59 | Ht 72.0 in | Wt 283.0 lb

## 2024-04-11 DIAGNOSIS — R221 Localized swelling, mass and lump, neck: Secondary | ICD-10-CM

## 2024-04-11 DIAGNOSIS — K118 Other diseases of salivary glands: Secondary | ICD-10-CM | POA: Diagnosis not present

## 2024-04-11 DIAGNOSIS — J383 Other diseases of vocal cords: Secondary | ICD-10-CM | POA: Diagnosis not present

## 2024-04-11 NOTE — Progress Notes (Signed)
 Dear Dr. Seabron, Here is my assessment for our mutual patient, Sean Gardner. Thank you for allowing me the opportunity to care for your patient. Please do not hesitate to contact me should you have any other questions. Sincerely, Dr. Eldora Blanch  Otolaryngology Clinic Note Referring provider: Dr. Seabron HPI:  Sean Gardner is a 68 y.o. male kindly referred by Dr. Seabron for evaluation of right neck mass.  Initial visit (03/2024): Right neck lump noticed about 5 weeks ago, not growing, not painful, noted incidentally. Evaluated at the [place], got a CT which showed as below, and referred here. Multiple skin cancers on face (bilateral temporal areas, cheeks, lip) - does not remember which type. Treated about 10 days ago (reports froze them off).  Patient otherwise denies: - dysphagia, odynophagia, unintentional weight loss - changes in voice, shortness of breath, hemoptysis - ear pain, other neck masses   ENT Surgery: denies Personal or FHx of bleeding dz or anesthesia difficulty: no  AP/AC: Plavix   Tobacco: quit (~2010) - 40 pack year Alcohol: quit, prior beer drinker (weekends)  PMHx: CAD s/p CABG, Atherosclerosis, Carcinoma of Left Lung (2010), GERD, HTN, Neuropathy, OSA, PVD Independent Review of Additional Tests or Records:  Dr. Ada Referral notes reviewed and uploaded or available in chart in media tab (03/29/2024): noted right neck mass, partially cystic; Dx: right neck mass; Rx: ref to ENT  Labs reviewed 01/2023 and uploaded or available in chart in media tab: WBC 7.99  CT Neck independently reviewed and interpreted 03/29/2024: right cystic nodule/mass in close proximity to right submandibular gland (on sagittal there may be a place?), scattered right LAD but no other necrotic nodes noted; right parotid nodule (1cm); no enhancing lesions noted oral cavity/OP/hypopharynx/larynx.     PMH/Meds/All/SocHx/FamHx/ROS:   Past Medical History:  Diagnosis Date   Adenomatous colon polyp     CAD (coronary artery disease)    Claudication in peripheral vascular disease (HCC)    GERD (gastroesophageal reflux disease)    Hyperlipidemia    Hypertension    Lung cancer (HCC)    neuroendocrine lung ca dx 2010   Myocardial infarction [facility]) 07/2000   Peripheral vascular disease (HCC)    Shortness of breath    on exertion,can't lay on his back   Sleep apnea    cpap     Past Surgical History:  Procedure Laterality Date   ACHILLES TENDON SURGERY Left    ANTERIOR HIP REVISION Right 11/13/2020   Procedure: IRRIGATION AND DEBRIDEMENT RIGHT HIP;  Surgeon: Jerri Kay HERO, MD;  Location: [facility] OR;  Service: Orthopedics;  Laterality: Right;   CORONARY ARTERY BYPASS GRAFT  08/18/2000   LIMA--dLIMA, RIMA-dRCA, left RA-OM2, SVG-D2   FLEXIBLE SIGMOIDOSCOPY N/A 01/18/2013   Procedure: FLEXIBLE SIGMOIDOSCOPY;  Surgeon: Elsie Cree, MD;  Location: [facility];  Service: Endoscopy;  Laterality: N/A;   HOT HEMOSTASIS N/A 01/18/2013   Procedure: HOT HEMOSTASIS (ARGON PLASMA COAGULATION/BICAP);  Surgeon: Elsie Cree, MD;  Location: [facility];  Service: Endoscopy;  Laterality: N/A;   INCISION AND DRAINAGE HIP Right 12/05/2020   Procedure: IRRIGATION AND DEBRIDEMENT RIGHT HIP;  Surgeon: Jerri Kay HERO, MD;  Location: [facility] OR;  Service: Orthopedics;  Laterality: Right;   INGUINAL HERNIA REPAIR Bilateral    inguinal   LEFT HEART CATH AND CORS/GRAFTS ANGIOGRAPHY N/A 07/21/2022   Procedure: LEFT HEART CATH AND CORS/GRAFTS ANGIOGRAPHY;  Surgeon: Ladona Heinz, MD;  Location: [facility];  Service: Cardiovascular;  Laterality: N/A;   LUNG SURGERY Left 01/29/2009   left  lower lobectomy    TOTAL HIP ARTHROPLASTY Right 09/16/2020   Procedure: RIGHT TOTAL HIP ARTHROPLASTY ANTERIOR APPROACH;  Surgeon: Jerri Kay HERO, MD;  Location: [facility];  Service: Orthopedics;  Laterality: Right;    Family History  Problem Relation Age of Onset   Hypertension Mother    Dementia Mother    Lung cancer Father         Agent orange   Heart disease Brother    Heart disease Brother    Colon cancer Neg Hx    Esophageal cancer Neg Hx    Rectal cancer Neg Hx    Stomach cancer Neg Hx      Social Connections: Not on file      Current Outpatient Medications:    amLODipine  (NORVASC ) 10 MG tablet, Take 1 tablet (10 mg total) by mouth daily., Disp: 90 tablet, Rfl: 1   Carboxymethylcellulose Sodium 1 % GEL, Apply 1 drop to eye at bedtime as needed., Disp: , Rfl:    Cholecalciferol (VITAMIN D3) 25 MCG (1000 UT) CAPS, Take 1 capsule by mouth daily., Disp: , Rfl:    clopidogrel  (PLAVIX ) 75 MG tablet, Take 1 tablet (75 mg total) by mouth daily., Disp: 90 tablet, Rfl: 3   diphenhydramine -acetaminophen  (TYLENOL  PM) 25-500 MG TABS tablet, Take 2 tablets by mouth at bedtime as needed (sleep)., Disp: , Rfl:    ezetimibe  (ZETIA ) 10 MG tablet, Take 1 tablet (10 mg total) by mouth daily after supper., Disp: 90 tablet, Rfl: 3   hydrALAZINE  (APRESOLINE ) 50 MG tablet, Take 1 tablet (50 mg total) by mouth 3 (three) times daily., Disp: 270 tablet, Rfl: 3   metoprolol  (LOPRESSOR ) 50 MG tablet, Take 50 mg by mouth 2 (two) times daily. Hold if systolic blood pressure (top blood pressure number) less than 100 mmHg or heart rate less than 60 bpm (pulse)., Disp: , Rfl:    rosuvastatin  (CRESTOR ) 40 MG tablet, TAKE 1 TABLET BY MOUTH EVERYDAY AT BEDTIME, Disp: 90 tablet, Rfl: 1   tiotropium (SPIRIVA ) 18 MCG inhalation capsule, Place 18 mcg into inhaler and inhale daily., Disp: , Rfl:    vitamin B-12 (CYANOCOBALAMIN) 500 MCG tablet, Take 1,000 mcg by mouth daily., Disp: , Rfl:    Physical Exam:   BP 111/67 (BP Location: Left Arm, Patient Position: Sitting, Cuff Size: Large)   Pulse (!) 59   Ht 6' (1.829 m)   Wt 283 lb (128.4 kg)   SpO2 92%   BMI 38.38 kg/m   Salient findings:  CN II-XII intact - right marginal mandibular nerve/lower lip movement intact  Bilateral EAC clear and TM intact with well pneumatized middle ear  spaces Anterior rhinoscopy: Septum relatively midline; bilateral inferior turbinates without significant hypertrophy No lesions of oral cavity/oropharynx; palpable tongue base without masses or abnormality Multiple facial skin lesions - bilateral temples, cheeks, left lower lip (he reports this is due to trauma, not recent bx/carcinoma) No obviously palpable neck masses/lymphadenopathy/thyromegaly EXCEPT for right well demarcated perifacial likely lymph node adjacent to right SMG Unable to appreciate parotid nodule No respiratory distress or stridor; TFL was indicated to better evaluate the proximal airway, given the patient's history and exam findings, and is detailed below.  Seprately Identifiable Procedures:  Prior to initiating any procedures, risks/benefits/alternatives were explained to the patient and verbal consent obtained. Procedure Note Pre-procedure diagnosis: Neck mass, rule out secondary lesion/source lesion investigation Post-procedure diagnosis: Same Procedure: Transnasal Fiberoptic Laryngoscopy, CPT 31575 - Mod 25 Indication: see above Complications: None apparent EBL: 0 mL  The procedure was undertaken to further evaluate above, with mirror exam inadequate for appropriate examination due to gag reflex and poor patient tolerance  Procedure:  Patient was identified as correct patient. Verbal consent was obtained. The nose was sprayed with oxymetazoline and 4% lidocaine . The The flexible laryngoscope was passed through the nose to view the nasal cavity, pharynx (oropharynx, hypopharynx) and larynx.  The larynx was examined at rest and during multiple phonatory tasks. Documentation was obtained and reviewed with patient. The scope was removed. The patient tolerated the procedure well.  Findings: The nasal cavity and nasopharynx did not reveal any masses or lesions, mucosa appeared to be without obvious lesions. The tongue base, pharyngeal walls, piriform sinuses, vallecula,  epiglottis and postcricoid region are normal in appearance. The visualized portion of the subglottis and proximal trachea is widely patent. The vocal folds are mobile bilaterally. Right posterior vocal fold with a small exophytic lesion but does not appear to be worrisome for carcinoma as it appears to be more polypoid    Electronically signed by: [name], MD 04/11/2024 11:37 AM   Impression & Plans:  Sean Gardner is a 68 y.o. male with:  1. Mass of right side of neck   2. Parotid nodule   3. Lesion of vocal fold    Multiple issues today, primarily right neck mass: we discussed DDX - perhaps related to salivary gland, skin cancer v/s benign lesion. Given location and appearance and medical history as well as his diffuse facial skin lesions, do worry about carcinoma here.  He needs further workup - will get US  and Bx  In addition, he also has a parotid nodule - unlikely to be source of his neck LAD but will also obtain FNA for this to rule out carcinoma  Lesion of VF: most likely polypoid change from his smoking history; given location of neck mass, would not consider this to be the source of his neck LAD. We discussed options and he opted to observe   - f/u 1 month with US  and FNA x2 (parotid and neck)  See below regarding exact medications prescribed this encounter including dosages and route: No orders of the defined types were placed in this encounter.     Thank you for allowing me the opportunity to care for your patient. Please do not hesitate to contact me should you have any other questions.  Sincerely, Eldora Blanch, MD Otolaryngologist (ENT), [facility] Phone: [phone] Fax: [phone]  04/11/2024, 11:37 AM   MDM:  Level 4 - [phone] Complexity/Problems addressed: mod - new problem, unknown diagnosis and prognosis needing further workup Data complexity: high - independent review of notes, labs, independent interpretation of testing -  Morbidity: unknown currently  - Prescription Drug prescribed or managed: no

## 2024-04-11 NOTE — Patient Instructions (Signed)
 I have ordered an imaging study for you to complete prior to your next visit. Please call [facility] Scheduling at [phone] to schedule your imaging if you have not received a call within 24 hours. If you are unable to complete your imaging study prior to your next scheduled visit please call our office to let us know.

## 2024-04-12 ENCOUNTER — Ambulatory Visit (HOSPITAL_COMMUNITY)
Admission: RE | Admit: 2024-04-12 | Discharge: 2024-04-12 | Disposition: A | Discharge status: Home / Self Care | Source: Ambulatory Visit | Attending: Otolaryngology | Admitting: Otolaryngology

## 2024-04-12 DIAGNOSIS — R221 Localized swelling, mass and lump, neck: Secondary | ICD-10-CM | POA: Insufficient documentation

## 2024-04-12 DIAGNOSIS — K118 Other diseases of salivary glands: Secondary | ICD-10-CM | POA: Insufficient documentation

## 2024-04-12 NOTE — Progress Notes (Signed)
 Philip Cornet, MD  [name] PROCEDURE / BIOPSY REVIEW Date: 04/12/24  Requested Biopsy site: Right parotid nodule Reason for request: FNA requested Imaging review: Best seen on Prior US  and CT  Decision: Approved Imaging modality to perform: Ultrasound Schedule with: No sedation / Local anesthetic Schedule for: Any VIR  Additional comments: FNA requested, need cytology  Please contact me with questions, concerns, or if issue pertaining to this request arise.  Cornet JONELLE Philip, MD [facility]       Previous Messages    ----- Message ----- From: Deby Adger Sent: 04/12/2024  11:14 AM EDT To: Ir Procedure Requests Subject: FW: US  FNA SOFT TISSUE                         ----- Message ----- From: Ralonda Tartt Sent: 04/12/2024  11:13 AM EDT To: Abdiaziz Klahn; Ir Procedure Requests Subject: US  FNA SOFT TISSUE                            Procedure : US  FNA SOFT TISSUE  Reason: right submandibular mass, rule out carcinoma (salivary v/s squamous cell) Dx: Parotid nodule [K11.8 (ICD-10-CM)]; Mass of right side of neck [R22.1 (ICD-10-CM)]  And   right parotid nodule Dx: Parotid nodule [K11.8 (ICD-10-CM)]      History : US  soft tissue head and neck  Provider : [name], MD  Provider contact :  [phone]

## 2024-04-12 NOTE — Progress Notes (Signed)
 Sean Cornet, MD  [name] PROCEDURE / BIOPSY REVIEW Date: 04/12/24  Requested Biopsy site: Right parotid and right submandibular lesion Reason for request: FNA of both lesions Imaging review: Best seen on CT from [facility].  Right parotid nodule and necrotic lymph node / cystic lesion adjacent to right submandibular gland.  Decision: Approved Imaging modality to perform: Ultrasound Schedule with: No sedation / Local anesthetic Schedule for: Any VIR  Additional comments: FNA of both lesions.  Need cytology.  Please contact me with questions, concerns, or if issue pertaining to this request arise.  [name], MD [facility]       Previous Messages    ----- Message ----- From: [name] Sent: 04/12/2024  11:13 AM EDT To: [name]; Ir Procedure Requests Subject: US  FNA SOFT TISSUE                            Procedure : US  FNA SOFT TISSUE  Reason: right submandibular mass, rule out carcinoma (salivary v/s squamous cell) Dx: Parotid nodule [K11.8 (ICD-10-CM)]; Mass of right side of neck [R22.1 (ICD-10-CM)]  And   right parotid nodule Dx: Parotid nodule [K11.8 (ICD-10-CM)]      History : US  soft tissue head and neck  Provider : [name], MD  Provider contact :  [phone]

## 2024-04-25 NOTE — Progress Notes (Signed)
 Philip Cornet, MD  Libbi Towner 1 slot is fine.  Adam       Previous Messages    ----- Message ----- From: Adamarys Shall Sent: 04/12/2024   2:56 PM EDT To: Cornet Philip, MD Subject: RE: US  FNA SOFT TISSUE                        Thank you . Can this be done at the same time in one appt time slot or do I need to schedule it for 2 time slots ? ----- Message ----- From: Philip Cornet, MD Sent: 04/12/2024   2:44 PM EDT To: Eleanor Mighty Subject: RE: US  FNA SOFT TISSUE                        PROCEDURE / BIOPSY REVIEW Date: 04/12/24  Requested Biopsy site: Right parotid and right submandibular lesion Reason for request: FNA of both lesions Imaging review: Best seen on [facility] from [facility].  Right parotid nodule and necrotic lymph node / cystic lesion adjacent to right submandibular gland.  Decision: Approved Imaging modality to perform: Ultrasound Schedule with: No sedation / Local anesthetic Schedule for: Any VIR  Additional comments: FNA of both lesions.  Need cytology.  Please contact me with questions, concerns, or if issue pertaining to this request arise.  Cornet JONELLE Philip, MD [facility] ----- Message ----- From: Kareena Arrambide Sent: 04/12/2024  11:13 AM EDT To: Latressa Harries; Ir Procedure Requests Subject: US  FNA SOFT TISSUE                            Procedure : US  FNA SOFT TISSUE  Reason: right submandibular mass, rule out carcinoma (salivary v/s squamous cell) Dx: Parotid nodule [K11.8 (ICD-10-CM)]; Mass of right side of neck [R22.1 (ICD-10-CM)]  And   right parotid nodule Dx: Parotid nodule [K11.8 (ICD-10-CM)]      History : US  soft tissue head and neck  Provider : [name], MD  Provider contact :  [phone]

## 2024-04-26 ENCOUNTER — Ambulatory Visit (HOSPITAL_COMMUNITY)
Admission: RE | Admit: 2024-04-26 | Discharge: 2024-04-26 | Disposition: A | Discharge status: Home / Self Care | Source: Ambulatory Visit | Attending: Otolaryngology | Admitting: Otolaryngology

## 2024-04-26 DIAGNOSIS — K118 Other diseases of salivary glands: Secondary | ICD-10-CM | POA: Diagnosis not present

## 2024-04-26 DIAGNOSIS — R221 Localized swelling, mass and lump, neck: Secondary | ICD-10-CM | POA: Diagnosis present

## 2024-04-26 MED ORDER — LIDOCAINE HCL 1 % IJ SOLN
INTRAMUSCULAR | Status: AC
  Filled 2024-04-26: qty 20

## 2024-04-26 NOTE — Procedures (Addendum)
 Vascular and Interventional Radiology Procedure Note  Patient: Sean Gardner DOB: 1956-05-30 Medical Record Number: [id number] Note Date/Time: 04/26/24 12:59 PM   Performing Physician: Thom Hall, MD Assistant(s): None  Diagnosis: R parotid mass and enlarged R neck LN  Procedure:  FINE NEEDLE ASPIRATION of a RIGHT PAROTID MASS RIGHT CERVICAL / SUBMANDIBULAR MASS ASPIRATION  Anesthesia: Local Anesthetic Complications: None Estimated Blood Loss: Minimal Specimens: Sent for Cytology and Pathology  Findings:  Successful Ultrasound-guided FNA of a R parotid mass and aspiration of R SM cervical mass. A total of 5 samples were obtained. 4 mL of serous fluid was aspirated from the R SM cervical mass Hemostasis of the tract was achieved using Manual Pressure.  Plan: Bed rest for 0 hours.  See detailed procedure note with images in PACS. The patient tolerated the procedure well without incident or complication and was returned to Recovery in stable condition.    Thom Hall, MD [facility]   Pager. [phone] Clinic. [phone]

## 2024-04-28 LAB — CYTOLOGY - NON PAP

## 2024-05-02 ENCOUNTER — Institutional Professional Consult (permissible substitution) (INDEPENDENT_AMBULATORY_CARE_PROVIDER_SITE_OTHER): Admitting: Otolaryngology

## 2024-05-10 ENCOUNTER — Ambulatory Visit (INDEPENDENT_AMBULATORY_CARE_PROVIDER_SITE_OTHER): Admitting: Otolaryngology

## 2024-05-10 ENCOUNTER — Encounter (INDEPENDENT_AMBULATORY_CARE_PROVIDER_SITE_OTHER): Payer: Self-pay | Admitting: Otolaryngology

## 2024-05-10 ENCOUNTER — Telehealth: Payer: Self-pay | Admitting: *Deleted

## 2024-05-10 VITALS — BP 128/74 | HR 61 | Ht 72.0 in | Wt 283.0 lb

## 2024-05-10 DIAGNOSIS — Z87891 Personal history of nicotine dependence: Secondary | ICD-10-CM

## 2024-05-10 DIAGNOSIS — K118 Other diseases of salivary glands: Secondary | ICD-10-CM | POA: Diagnosis not present

## 2024-05-10 DIAGNOSIS — R221 Localized swelling, mass and lump, neck: Secondary | ICD-10-CM | POA: Diagnosis not present

## 2024-05-10 DIAGNOSIS — J383 Other diseases of vocal cords: Secondary | ICD-10-CM | POA: Diagnosis not present

## 2024-05-10 NOTE — Telephone Encounter (Signed)
 Tried contacting patient to schedule TELEVISIT no answer left a detailed vm to call back and schedule

## 2024-05-10 NOTE — Telephone Encounter (Signed)
   Name: Sean Gardner  DOB: 11/15/1955  MRN: [id number]  Primary Cardiologist: Gordy Bergamo, MD   Preoperative team, please contact this patient and set up a phone call appointment for further preoperative risk assessment. Please obtain consent and complete medication review. Thank you for your help. Last seen by Reche Finder, NP on 09/06/2023.  I confirm that guidance regarding antiplatelet and oral anticoagulation therapy has been completed and, if necessary, noted below.  Per office protocol, if patient is without any new symptoms or concerns at the time of their virtual visit, he may hold Plavix  for 5 days, and ASA for 7 days prior to procedure. Please resume Plavix  and ASA as soon as possible postprocedure, at the discretion of the surgeon.    I also confirmed the patient [place] in the state of [place] . As per [facility] Medical Board telemedicine laws, the patient must reside in the state in which the provider is licensed.   Lamarr Satterfield, NP 05/10/2024, 3:42 PM [facility]

## 2024-05-10 NOTE — Progress Notes (Signed)
 Dear Dr. Seabron, Here is my assessment for our mutual patient, Sean Gardner. Thank you for allowing me the opportunity to care for your patient. Please do not hesitate to contact me should you have any other questions. Sincerely, Dr. Eldora Blanch  Otolaryngology Clinic Note Referring provider: Dr. Seabron HPI:  Sean Gardner is a 68 y.o. male kindly referred by Dr. Seabron for evaluation of right neck mass.  Initial visit (03/2024): Right neck lump noticed about 5 weeks ago, not growing, not painful, noted incidentally. Evaluated at the [place], got a CT which showed as below, and referred here. Multiple skin cancers on face (bilateral temporal areas, cheeks, lip) - does not remember which type. Treated about 10 days ago (reports froze them off).  Patient otherwise denies: - dysphagia, odynophagia, unintentional weight loss - changes in voice, shortness of breath, hemoptysis - ear pain, other neck masses  --------------------------------------------------------- 05/10/2024 Seen in follow up after FNA. We had a long discussion regarding pathology and management. No recent skin cancer diagnoses or new lesions. He continues to not have significant symptoms; no facial weakness or numbness; Size of neck nodule about the same.    ENT Surgery: denies Personal or FHx of bleeding dz or anesthesia difficulty: no  AP/AC: Plavix   Tobacco: quit (~2010) - 40 pack year Alcohol: quit, prior beer drinker (weekends)  PMHx: CAD s/p CABG, Atherosclerosis, Carcinoma of Left Lung (2010), GERD, HTN, Neuropathy, OSA, PVD Independent Review of Additional Tests or Records:  Dr. Ada Referral notes reviewed and uploaded or available in chart in media tab (03/29/2024): noted right neck mass, partially cystic; Dx: right neck mass; Rx: ref to ENT  Labs reviewed 01/2023 and uploaded or available in chart in media tab: WBC 7.99  CT Neck independently reviewed and interpreted 03/29/2024: right cystic nodule/mass in close  proximity to right submandibular gland (on sagittal there may be a place?), scattered right LAD but no other necrotic nodes noted; right parotid nodule (1cm); no enhancing lesions noted oral cavity/OP/hypopharynx/larynx.     FNA 04/26/2024: Parotid mass - right: Warthin's tumor Right submandibular lesion: noted atypical cells, with keratinized debris - possible epidermal inclusion cyst v/s SCCa  PMH/Meds/All/SocHx/FamHx/ROS:   Past Medical History:  Diagnosis Date   Adenomatous colon polyp    CAD (coronary artery disease)    Claudication in peripheral vascular disease (HCC)    GERD (gastroesophageal reflux disease)    Hyperlipidemia    Hypertension    Lung cancer (HCC)    neuroendocrine lung ca dx 2010   Myocardial infarction [facility]) 07/2000   Peripheral vascular disease (HCC)    Shortness of breath    on exertion,can't lay on his back   Sleep apnea    cpap     Past Surgical History:  Procedure Laterality Date   ACHILLES TENDON SURGERY Left    ANTERIOR HIP REVISION Right 11/13/2020   Procedure: IRRIGATION AND DEBRIDEMENT RIGHT HIP;  Surgeon: Jerri Kay HERO, MD;  [facility];  Service: Orthopedics;  Laterality: Right;   CORONARY ARTERY BYPASS GRAFT  08/18/2000   LIMA--dLIMA, RIMA-dRCA, left RA-OM2, SVG-D2   FLEXIBLE SIGMOIDOSCOPY N/A 01/18/2013   Procedure: FLEXIBLE SIGMOIDOSCOPY;  Surgeon: Elsie Cree, MD;  Location: [facility];  Service: Endoscopy;  Laterality: N/A;   HOT HEMOSTASIS N/A 01/18/2013   Procedure: HOT HEMOSTASIS (ARGON PLASMA COAGULATION/BICAP);  Surgeon: Elsie Cree, MD;  Location: [facility];  Service: Endoscopy;  Laterality: N/A;   INCISION AND DRAINAGE HIP Right 12/05/2020   Procedure: IRRIGATION AND DEBRIDEMENT RIGHT HIP;  Surgeon: Jerri Kay HERO, MD;  Location: [facility] OR;  Service: Orthopedics;  Laterality: Right;   INGUINAL HERNIA REPAIR Bilateral    inguinal   LEFT HEART CATH AND CORS/GRAFTS ANGIOGRAPHY N/A 07/21/2022   Procedure: LEFT HEART CATH  AND CORS/GRAFTS ANGIOGRAPHY;  Surgeon: Ladona Heinz, MD;  [facility];  Service: Cardiovascular;  Laterality: N/A;   LUNG SURGERY Left 01/29/2009   left lower lobectomy    TOTAL HIP ARTHROPLASTY Right 09/16/2020   Procedure: RIGHT TOTAL HIP ARTHROPLASTY ANTERIOR APPROACH;  Surgeon: Jerri Kay HERO, MD;  Location: [facility] OR;  Service: Orthopedics;  Laterality: Right;    Family History  Problem Relation Age of Onset   Hypertension Mother    Dementia Mother    Lung cancer Father        Agent orange   Heart disease Brother    Heart disease Brother    Colon cancer Neg Hx    Esophageal cancer Neg Hx    Rectal cancer Neg Hx    Stomach cancer Neg Hx      Social Connections: Not on file      Current Outpatient Medications:    amLODipine  (NORVASC ) 10 MG tablet, Take 1 tablet (10 mg total) by mouth daily., Disp: 90 tablet, Rfl: 1   Carboxymethylcellulose Sodium 1 % GEL, Apply 1 drop to eye at bedtime as needed., Disp: , Rfl:    Cholecalciferol (VITAMIN D3) 25 MCG (1000 UT) CAPS, Take 1 capsule by mouth daily., Disp: , Rfl:    ciprofloxacin (CIPRO) 500 MG tablet, Take 500 mg by mouth 2 (two) times daily., Disp: , Rfl:    clopidogrel  (PLAVIX ) 75 MG tablet, Take 1 tablet (75 mg total) by mouth daily., Disp: 90 tablet, Rfl: 3   diphenhydramine -acetaminophen  (TYLENOL  PM) 25-500 MG TABS tablet, Take 2 tablets by mouth at bedtime as needed (sleep)., Disp: , Rfl:    ezetimibe  (ZETIA ) 10 MG tablet, Take 1 tablet (10 mg total) by mouth daily after supper., Disp: 90 tablet, Rfl: 3   hydrALAZINE  (APRESOLINE ) 50 MG tablet, Take 1 tablet (50 mg total) by mouth 3 (three) times daily., Disp: 270 tablet, Rfl: 3   metoprolol  (LOPRESSOR ) 50 MG tablet, Take 50 mg by mouth 2 (two) times daily. Hold if systolic blood pressure (top blood pressure number) less than 100 mmHg or heart rate less than 60 bpm (pulse)., Disp: , Rfl:    rosuvastatin  (CRESTOR ) 40 MG tablet, TAKE 1 TABLET BY MOUTH EVERYDAY AT  BEDTIME, Disp: 90 tablet, Rfl: 1   tiotropium (SPIRIVA ) 18 MCG inhalation capsule, Place 18 mcg into inhaler and inhale daily., Disp: , Rfl:    vitamin B-12 (CYANOCOBALAMIN) 500 MCG tablet, Take 1,000 mcg by mouth daily., Disp: , Rfl:    Physical Exam:   BP 128/74 (BP Location: Left Arm, Patient Position: Sitting, Cuff Size: Large)   Pulse 61   Ht 6' (1.829 m)   Wt 283 lb (128.4 kg)   SpO2 (!) 88%   BMI 38.38 kg/m   Salient findings:  CN II-XII intact - right marginal mandibular nerve/lower lip movement intact  Bilateral EAC clear and TM intact with well pneumatized middle ear spaces Anterior rhinoscopy: Septum relatively midline; bilateral inferior turbinates without significant hypertrophy No lesions of oral cavity/oropharynx; palpable tongue base without masses or abnormality Multiple facial skin lesions and scars - bilateral temples, cheeks, left lower lip (he reports this is due to trauma, not recent bx/carcinoma) - this is stable No obviously palpable neck masses/lymphadenopathy/thyromegaly  EXCEPT for right well demarcated perifacial likely lymph node adjacent to right SMG; this is stable Unable to appreciate parotid nodule No respiratory distress or stridor  Seprately Identifiable Procedures:  Prior to initiating any procedures, risks/benefits/alternatives were explained to the patient and verbal consent obtained. Procedure Note - TFL Prior, not today  The procedure was undertaken to further evaluate above, with mirror exam inadequate for appropriate examination due to gag reflex and poor patient tolerance  Procedure:  Patient was identified as correct patient. Verbal consent was obtained. The nose was sprayed with oxymetazoline and 4% lidocaine . The The flexible laryngoscope was passed through the nose to view the nasal cavity, pharynx (oropharynx, hypopharynx) and larynx.  The larynx was examined at rest and during multiple phonatory tasks. Documentation was obtained and  reviewed with patient. The scope was removed. The patient tolerated the procedure well.  Findings: The nasal cavity and nasopharynx did not reveal any masses or lesions, mucosa appeared to be without obvious lesions. The tongue base, pharyngeal walls, piriform sinuses, vallecula, epiglottis and postcricoid region are normal in appearance. The visualized portion of the subglottis and proximal trachea is widely patent. The vocal folds are mobile bilaterally. Right posterior vocal fold with a small exophytic lesion but does not appear to be worrisome for carcinoma as it appears to be more polypoid    Electronically signed by: [name], MD 05/14/2024 10:06 PM   Impression & Plans:  Miklos Bidinger is a 68 y.o. male with:  1. Mass of right side of neck   2. Parotid nodule   3. Lesion of vocal fold    Multiple issues today, primarily right neck mass: we discussed DDX - perhaps related to salivary gland, skin cancer v/s benign lesion. Given location and appearance and medical history as well as his diffuse facial skin lesions, do worry about carcinoma here. Noted atypical cells - as such, we discussed options --- close observation v/s right biopsy with DL/Bx with frozen section at time of surgery. If positive, will proceed with Neck dissection.  Note - if he does have skin cancer primary -- no large primaries currently appreciable and multiple possibilities for which derm is helping manage. Without known primary, do not think performing contralateral ND warranted and can treat as if CUP.  Parotid nodule: Warthin's tumor; will observe  Lesion of VF: most likely polypoid change from his smoking history; given location of neck mass, would not consider this to be the source of his neck LAD. Will do DL/Bx at time of surgery  We also discussed R/B/A for surgery, including pain, bleeding, infection, numbness, risks to major vascular structures and life threatening bleed, tongue or lip weakness, chyle  leak, injury to vagus or phrenic nerve, change in speech or swallowing, need for further procedures or treatment. We also discussed post-op management and expectations including need for drain. Patient understands all of this and is anxious to proceed  Given imaging was over 2 months ago, will get updated CT to ensure no significant change in size or other neck LAD has developed.   Will need cardiology clearance from Dr. Godfrey office for Plavix  - rec holding 5 days prior, likely re-start POD ~3-4  He is ready to proceed - right SMG and neck mass excision, possible right ND; DL/Bx  See below regarding exact medications prescribed this encounter including dosages and route: No orders of the defined types were placed in this encounter.   Thank you for allowing me the opportunity to care for  your patient. Please do not hesitate to contact me should you have any other questions.  Sincerely, Eldora Blanch, MD Otolaryngologist (ENT), [facility] Phone: [phone] Fax: [phone]  05/14/2024, 10:06 PM   I have personally spent 42 minutes involved in face-to-face and non-face-to-face activities for this patient on the day of the visit.  Professional time spent excludes any procedures performed but includes the following activities, in addition to those noted in the documentation: preparing to see the patient (review of outside documentation and results), performing a medically appropriate examination, extensive counseling, documenting in the electronic health record

## 2024-05-10 NOTE — Telephone Encounter (Signed)
   Pre-operative Risk Assessment    Patient Name: Sean Gardner  DOB: 1956-09-07 MRN: [id number]   Date of last office visit: 09/06/23 CAITLIN WALKER, NP Date of next office visit: NONE   Request for Surgical Clearance    Procedure:  RIGHT SUBMANDIBULAR GLAND RESECTION, RIGHT NECK CERVICAL  LYMPH NODE Bx    Date of Surgery:  Clearance 06/14/24                                Surgeon: DR ELDORA BLANCH Surgeon's Group or Practice Name:  [facility]  Phone number:  [phone] Fax number:  [phone]   Type of Clearance Requested:   - Medical  - Pharmacy:  Hold Clopidogrel  (Plavix ) x 3 DAYS PRIOR AND RESUME 3 DAYS S/P PROCEDURE   Type of Anesthesia:  General    Additional requests/questions:    Bonney Niels Jest   05/10/2024, 3:26 PM

## 2024-05-10 NOTE — Patient Instructions (Signed)
 I have ordered an imaging study for you to complete prior to your next visit. Please call [facility] Scheduling at [phone] to schedule your imaging if you have not received a call within 24 hours. If you are unable to complete your imaging study prior to your next scheduled visit please call our office to let us know.

## 2024-05-12 NOTE — Telephone Encounter (Signed)
 2nd attempt to reach patient, left a message for him to call back.

## 2024-05-16 NOTE — Telephone Encounter (Signed)
 3rd attempt to contact pt to schedule preop clearance, will update surgeons office and remove from preop pool

## 2024-05-18 ENCOUNTER — Telehealth (INDEPENDENT_AMBULATORY_CARE_PROVIDER_SITE_OTHER): Payer: Self-pay | Admitting: Otolaryngology

## 2024-05-18 ENCOUNTER — Telehealth: Payer: Self-pay | Admitting: *Deleted

## 2024-05-18 NOTE — Telephone Encounter (Signed)
 Patient called and stated that he is unable to get a touch with Dr. Godfrey office. I gave patient the phone number to call [phone].

## 2024-05-18 NOTE — Telephone Encounter (Signed)
 Front desk staff Tod called me and said pt walked in and said we had been trying to reach him to schedule tele preop appt. While I was in the line with Tod, he helped converse with the pt for me and together we were able to schedule the pt 06/02/24 tele preop appt. Med rec and consent are done as well.

## 2024-05-18 NOTE — Telephone Encounter (Signed)
 Front desk staff Tod called me and said pt walked in and said we had been trying to reach him to schedule tele preop appt. While I was in the line with Tod, he helped converse with the pt for me and together we were able to schedule the pt 06/02/24 tele preop appt. Med rec and consent are done as well.     Patient Consent for Virtual Visit        Sean Gardner has provided verbal consent on 05/18/2024 for a virtual visit (video or telephone).   CONSENT FOR VIRTUAL VISIT FOR:  Sean Gardner  By participating in this virtual visit I agree to the following:  I hereby voluntarily request, consent and authorize [facility] and its employed or contracted physicians, physician assistants, nurse practitioners or other licensed health care professionals (the Practitioner), to provide me with telemedicine health care services (the "Services) as deemed necessary by the treating Practitioner. I acknowledge and consent to receive the Services by the Practitioner via telemedicine. I understand that the telemedicine visit will involve communicating with the Practitioner through live audiovisual communication technology and the disclosure of certain medical information by electronic transmission. I acknowledge that I have been given the opportunity to request an in-person assessment or other available alternative prior to the telemedicine visit and am voluntarily participating in the telemedicine visit.  I understand that I have the right to withhold or withdraw my consent to the use of telemedicine in the course of my care at any time, without affecting my right to future care or treatment, and that the Practitioner or I may terminate the telemedicine visit at any time. I understand that I have the right to inspect all information obtained and/or recorded in the course of the telemedicine visit and may receive copies of available information for a reasonable fee.  I understand that some of the  potential risks of receiving the Services via telemedicine include:  Delay or interruption in medical evaluation due to technological equipment failure or disruption; Information transmitted may not be sufficient (e.g. poor resolution of images) to allow for appropriate medical decision making by the Practitioner; and/or  In rare instances, security protocols could fail, causing a breach of personal health information.  Furthermore, I acknowledge that it is my responsibility to provide information about my medical history, conditions and care that is complete and accurate to the best of my ability. I acknowledge that Practitioner's advice, recommendations, and/or decision may be based on factors not within their control, such as incomplete or inaccurate data provided by me or distortions of diagnostic images or specimens that may result from electronic transmissions. I understand that the practice of medicine is not an exact science and that Practitioner makes no warranties or guarantees regarding treatment outcomes. I acknowledge that a copy of this consent can be made available to me via my patient portal [facility] MyChart), or I can request a printed copy by calling the office of [facility].    I understand that my insurance will be billed for this visit.   I have read or had this consent read to me. I understand the contents of this consent, which adequately explains the benefits and risks of the Services being provided via telemedicine.  I have been provided ample opportunity to ask questions regarding this consent and the Services and have had my questions answered to my satisfaction. I give my informed consent for the services to be provided through the use of  telemedicine in my medical care

## 2024-05-18 NOTE — Telephone Encounter (Signed)
 Patient stopped in and wanted to let you know that he has a televisit scheduled with Damien Potters at [facility] (Dr. Godfrey office) on 06/02/2024 @ 8:40am.  He will be out of town on 05/21/2024 until 05/29/2024.  He would like to know what he needs to do next in order to proceed with getting surgery scheduled.  He can be reached at [phone].

## 2024-06-02 ENCOUNTER — Ambulatory Visit: Attending: Cardiovascular Disease | Admitting: Nurse Practitioner

## 2024-06-02 DIAGNOSIS — Z01810 Encounter for preprocedural cardiovascular examination: Secondary | ICD-10-CM | POA: Diagnosis not present

## 2024-06-02 NOTE — Progress Notes (Signed)
 Virtual Visit via Telephone Note   Because of MACEN JOSLIN co-morbid illnesses, he is at least at moderate risk for complications without adequate follow up.  This format is felt to be most appropriate for this patient at this time.  Due to technical limitations with video connection (technology), today's appointment will be conducted as an audio only telehealth visit, and WEYMAN BOGDON verbally agreed to proceed in this manner.   All issues noted in this document were discussed and addressed.  No physical exam could be performed with this format.  Evaluation Performed:  Preoperative cardiovascular risk assessment _____________   Date:  06/02/2024   Patient ID:  Nancyann LITTIE Ada, DOB 21-Mar-1956, MRN [id number] Patient Location:  Home Provider location:   Office  Primary Care Provider:  Seabron Lenis, MD Primary Cardiologist:  Gordy Bergamo, MD  Chief Complaint / Patient Profile   68 y.o. y/o male with a h/o CAD s/p CABG in 2001, RBBB, hypertension, hyperlipidemia, PAD, OSA on CPAP, obesity, prior tobacco use, lung cancer s/p chemotherapy, left lower lobe lobectomy who is pending RIGHT SUBMANDIBULAR GLAND RESECTION, RIGHT NECK CERVICAL  LYMPH NODE Bx on 06/14/2024 with Dr. Eldora Blanch of [facility] and presents today for telephonic preoperative cardiovascular risk assessment.  History of Present Illness    MARVELLE CAUDILL is a 68 y.o. male who presents via audio/video conferencing for a telehealth visit today.  Pt was last seen in cardiology clinic on 09/06/2023 by Reche Finder, NP.  At that time ARISH REDNER was doing well. The patient is now pending procedure as outlined above. Since his last visit, he has done well from a cardiac standpoint.   He denies chest pain, palpitations, dyspnea, pnd, orthopnea, n, v, dizziness, syncope, edema, weight gain, or early satiety. All other systems reviewed and are otherwise negative except as noted above.   Past Medical History    Past  Medical History:  Diagnosis Date   Adenomatous colon polyp    CAD (coronary artery disease)    Claudication in peripheral vascular disease (HCC)    GERD (gastroesophageal reflux disease)    Hyperlipidemia    Hypertension    Lung cancer (HCC)    neuroendocrine lung ca dx 2010   Myocardial infarction [facility]) 07/2000   Peripheral vascular disease (HCC)    Shortness of breath    on exertion,can't lay on his back   Sleep apnea    cpap   Past Surgical History:  Procedure Laterality Date   ACHILLES TENDON SURGERY Left    ANTERIOR HIP REVISION Right 11/13/2020   Procedure: IRRIGATION AND DEBRIDEMENT RIGHT HIP;  Surgeon: Jerri Kay HERO, MD;  Location: [facility];  Service: Orthopedics;  Laterality: Right;   CORONARY ARTERY BYPASS GRAFT  08/18/2000   LIMA--dLIMA, RIMA-dRCA, left RA-OM2, SVG-D2   FLEXIBLE SIGMOIDOSCOPY N/A 01/18/2013   Procedure: FLEXIBLE SIGMOIDOSCOPY;  Surgeon: Elsie Cree, MD;  [facility];  Service: Endoscopy;  Laterality: N/A;   HOT HEMOSTASIS N/A 01/18/2013   Procedure: HOT HEMOSTASIS (ARGON PLASMA COAGULATION/BICAP);  Surgeon: Elsie Cree, MD;  Location: [facility];  Service: Endoscopy;  Laterality: N/A;   INCISION AND DRAINAGE HIP Right 12/05/2020   Procedure: IRRIGATION AND DEBRIDEMENT RIGHT HIP;  Surgeon: Jerri Kay HERO, MD;  [facility];  Service: Orthopedics;  Laterality: Right;   INGUINAL HERNIA REPAIR Bilateral    inguinal   LEFT HEART CATH AND CORS/GRAFTS ANGIOGRAPHY N/A 07/21/2022   Procedure: LEFT HEART CATH AND CORS/GRAFTS ANGIOGRAPHY;  Surgeon:  Ladona Heinz, MD;  Location: [facility] INVASIVE CV LAB;  Service: Cardiovascular;  Laterality: N/A;   LUNG SURGERY Left 01/29/2009   left lower lobectomy    TOTAL HIP ARTHROPLASTY Right 09/16/2020   Procedure: RIGHT TOTAL HIP ARTHROPLASTY ANTERIOR APPROACH;  Surgeon: Jerri Kay HERO, MD;  [facility];  Service: Orthopedics;  Laterality: Right;    Allergies  Allergies  Allergen Reactions   Lisinopril   Other (See Comments)    Hyperkalemia   Zolpidem Tartrate Other (See Comments)    Felt fuzzy and hallucinations    Home Medications    Prior to Admission medications   Medication Sig Start Date End Date Taking? Authorizing Provider  amLODipine  (NORVASC ) 10 MG tablet Take 1 tablet (10 mg total) by mouth daily. 12/26/21 05/18/24  Cantwell, Celeste C, PA-C  Carboxymethylcellulose Sodium 1 % GEL Apply 1 drop to eye at bedtime as needed.    [provider]  Cholecalciferol (VITAMIN D3) 25 MCG (1000 UT) CAPS Take 1 capsule by mouth daily.    [provider]  ciprofloxacin (CIPRO) 500 MG tablet Take 500 mg by mouth 2 (two) times daily. 05/02/24   [provider]  clopidogrel  (PLAVIX ) 75 MG tablet Take 1 tablet (75 mg total) by mouth daily. 07/07/23   Ladona Heinz, MD  diphenhydramine -acetaminophen  (TYLENOL  PM) 25-500 MG TABS tablet Take 2 tablets by mouth at bedtime as needed (sleep).    [provider]  ezetimibe  (ZETIA ) 10 MG tablet Take 1 tablet (10 mg total) by mouth daily after supper. 05/27/21   Ladona Heinz, MD  hydrALAZINE  (APRESOLINE ) 50 MG tablet Take 1 tablet (50 mg total) by mouth 3 (three) times daily. 11/05/21   Ladona Heinz, MD  metoprolol  (LOPRESSOR ) 50 MG tablet Take 50 mg by mouth 2 (two) times daily. Hold if systolic blood pressure (top blood pressure number) less than 100 mmHg or heart rate less than 60 bpm (pulse).    [provider]  rosuvastatin  (CRESTOR ) 40 MG tablet TAKE 1 TABLET BY MOUTH EVERYDAY AT BEDTIME 12/12/21   Ladona Heinz, MD  tiotropium (SPIRIVA ) 18 MCG inhalation capsule Place 18 mcg into inhaler and inhale daily.    [provider]  vitamin B-12 (CYANOCOBALAMIN) 500 MCG tablet Take 1,000 mcg by mouth daily.    [provider]    Physical Exam    Vital Signs:  Nancyann LITTIE Ada does not have vital signs available for review today.  Given telephonic nature of communication, physical exam is limited. AAOx3. NAD.  Normal affect.  Speech and respirations are unlabored.  Accessory Clinical Findings    None  Assessment & Plan    1.  Preoperative Cardiovascular Risk Assessment:  According to the Revised Cardiac Risk Index (RCRI), his Perioperative Risk of Major Cardiac Event is (%): 0.9. His Functional Capacity in METs is: 6.36 according to the [employer] Activity Status Index (DASI).Therefore, based on ACC/AHA guidelines, patient would be at acceptable risk for the planned procedure without further cardiovascular testing.   The patient was advised that if he develops new symptoms prior to surgery to contact our office to arrange for a follow-up visit, and he verbalized understanding.  Per office protocol, he may hold Plavix  for 5 days prior to procedure. Please resume Plavix  as soon as possible postprocedure, at the discretion of the surgeon.    A copy of this note will be routed to requesting surgeon.  Time:   Today, I have spent 5 minutes with the patient with telehealth technology discussing  medical history, symptoms, and management plan.     Damien JAYSON Braver, NP  06/02/2024, 8:48 AM

## 2024-06-06 ENCOUNTER — Telehealth (INDEPENDENT_AMBULATORY_CARE_PROVIDER_SITE_OTHER): Payer: Self-pay

## 2024-06-06 NOTE — Telephone Encounter (Signed)
 Received authorization for patient to stop Plavix  5 days before upcoming surgery and start immediately after. Informed patient and patient verbalizes understanding.

## 2024-06-07 ENCOUNTER — Telehealth (INDEPENDENT_AMBULATORY_CARE_PROVIDER_SITE_OTHER): Payer: Self-pay | Admitting: Otolaryngology

## 2024-06-07 NOTE — Telephone Encounter (Signed)
 Please call has questions regarding sx

## 2024-06-12 ENCOUNTER — Ambulatory Visit (HOSPITAL_BASED_OUTPATIENT_CLINIC_OR_DEPARTMENT_OTHER)
Admission: RE | Admit: 2024-06-12 | Discharge: 2024-06-12 | Disposition: A | Discharge status: Home / Self Care | Source: Ambulatory Visit | Attending: Otolaryngology | Admitting: Otolaryngology

## 2024-06-12 DIAGNOSIS — D110 Benign neoplasm of parotid gland: Secondary | ICD-10-CM

## 2024-06-12 DIAGNOSIS — R221 Localized swelling, mass and lump, neck: Secondary | ICD-10-CM | POA: Diagnosis present

## 2024-06-12 MED ORDER — IOHEXOL 300 MG/ML  SOLN
75.0000 mL | Freq: Once | INTRAMUSCULAR | Status: AC | PRN
Start: 2024-06-12 — End: 2024-06-12
  Administered 2024-06-12: 75 mL via INTRAVENOUS

## 2024-06-13 ENCOUNTER — Telehealth (INDEPENDENT_AMBULATORY_CARE_PROVIDER_SITE_OTHER): Payer: Self-pay

## 2024-06-13 NOTE — Telephone Encounter (Signed)
 Patient called regarding upcoming appointments for surgery

## 2024-06-13 NOTE — Telephone Encounter (Signed)
 Called patient back after speaking with Dr. Tobie. I let him know that he could go to his appointments but not to let anyone do surgery or any procedures. Patient is supposed to be getting gel shots for his knees. Is it okay for him to do this?. Please advise.

## 2024-06-13 NOTE — Telephone Encounter (Signed)
 Gave a call back to the patient regarding a call that I took earlier. Patient was hard to understand. Patient is concerned about if he needs to got to appointments with GI and the orthopedic doctor regarding his knee. I explained to the patient that I believe that it is okay to go to appointments before surgery but if there is another surgery or procedure close to the surgery date to let us  know. Patient wanted to know where to be for his surgery. Please advise.

## 2024-06-14 ENCOUNTER — Telehealth (INDEPENDENT_AMBULATORY_CARE_PROVIDER_SITE_OTHER): Payer: Self-pay

## 2024-06-14 NOTE — Telephone Encounter (Signed)
 Returned patient's call. Answered previous questions patient understood.

## 2024-06-29 ENCOUNTER — Encounter (INDEPENDENT_AMBULATORY_CARE_PROVIDER_SITE_OTHER): Payer: Self-pay | Admitting: Otolaryngology

## 2024-06-29 ENCOUNTER — Ambulatory Visit (INDEPENDENT_AMBULATORY_CARE_PROVIDER_SITE_OTHER): Admitting: Otolaryngology

## 2024-06-29 DIAGNOSIS — J383 Other diseases of vocal cords: Secondary | ICD-10-CM

## 2024-06-29 DIAGNOSIS — Z87891 Personal history of nicotine dependence: Secondary | ICD-10-CM

## 2024-06-29 DIAGNOSIS — K118 Other diseases of salivary glands: Secondary | ICD-10-CM

## 2024-06-29 DIAGNOSIS — R221 Localized swelling, mass and lump, neck: Secondary | ICD-10-CM | POA: Diagnosis not present

## 2024-06-29 NOTE — Progress Notes (Signed)
 Dear Dr. Seabron, Here is my assessment for our mutual patient, Sean Gardner. Thank you for allowing me the opportunity to care for your patient. Please do not hesitate to contact me should you have any other questions. Sincerely, Dr. Eldora Blanch  Otolaryngology Clinic Note Referring provider: Dr. Seabron HPI:  Sean Gardner is a 68 y.o. male kindly referred by Dr. Seabron for evaluation of right neck mass.  Initial visit (03/2024): Right neck lump noticed about 5 weeks ago, not growing, not painful, noted incidentally. Evaluated at the [place], got a CT which showed as below, and referred here. Multiple skin cancers on face (bilateral temporal areas, cheeks, lip) - does not remember which type. Treated about 10 days ago (reports froze them off).  Patient otherwise denies: - dysphagia, odynophagia, unintentional weight loss - changes in voice, shortness of breath, hemoptysis - ear pain, other neck masses  --------------------------------------------------------- 05/10/2024 Seen in follow up after FNA. We had a long discussion regarding pathology and management. No recent skin cancer diagnoses or new lesions. He continues to not have significant symptoms; no facial weakness or numbness; Size of neck nodule about the same. --------------------------------------------------------- 06/29/2024 The patient gave consent to have this visit done by telemedicine / virtual visit, two identifiers were used to identify patient. This is also consent for access the chart and treat the patient via this visit. The patient is located in [place].  I, the provider, am at the office.  We spent 21 minutes in preparation and on the phone for the visit. Visit via audio  We discussed plan and I answered his questions. He just had a cough a couple of days ago but it resolved. No sore throat, dry cough, no breathing concerns. He has been cleared by cardiology. We discussed plan for neck dissection and tonsillectomy if frozen section  is positive for carcinoma. We also reviewed his CT neck again.     ENT Surgery: denies Personal or FHx of bleeding dz or anesthesia difficulty: no  AP/AC: Plavix   Tobacco: quit (~2010) - 40 pack year Alcohol: quit, prior beer drinker (weekends)  PMHx: CAD s/p CABG, Atherosclerosis, Carcinoma of Left Lung (2010), GERD, HTN, Neuropathy, OSA, PVD Independent Review of Additional Tests or Records:  Dr. Ada Referral notes reviewed and uploaded or available in chart in media tab (03/29/2024): noted right neck mass, partially cystic; Dx: right neck mass; Rx: ref to ENT  Labs reviewed 01/2023 and uploaded or available in chart in media tab: WBC 7.99  CT Neck independently reviewed and interpreted 03/29/2024: right cystic nodule/mass in close proximity to right submandibular gland (on sagittal there may be a place?), scattered right LAD but no other necrotic nodes noted; right parotid nodule (1cm); no enhancing lesions noted oral cavity/OP/hypopharynx/larynx.     FNA 04/26/2024: Parotid mass - right: Warthin's tumor Right submandibular lesion: noted atypical cells, with keratinized debris - possible epidermal inclusion cyst v/s SCCa  CT Neck 06/12/2024 independently interpreted: noted right 3x3 cm cystic and solid mass from right SMG. No other adenopathy noted, unable to appreciate any obvious source from proximal airway if carcinoma.   PMH/Meds/All/SocHx/FamHx/ROS:   Past Medical History:  Diagnosis Date   Adenomatous colon polyp    CAD (coronary artery disease)    Claudication in peripheral vascular disease (HCC)    GERD (gastroesophageal reflux disease)    Hyperlipidemia    Hypertension    Lung cancer (HCC)    neuroendocrine lung ca dx 2010   Myocardial infarction (HCC) 07/2000   Peripheral  vascular disease (HCC)    Shortness of breath    on exertion,can't lay on his back   Sleep apnea    cpap     Past Surgical History:  Procedure Laterality Date   ACHILLES TENDON SURGERY Left     ANTERIOR HIP REVISION Right 11/13/2020   Procedure: IRRIGATION AND DEBRIDEMENT RIGHT HIP;  Surgeon: Jerri Kay HERO, MD;  Location: [facility];  Service: Orthopedics;  Laterality: Right;   CORONARY ARTERY BYPASS GRAFT  08/18/2000   LIMA--dLIMA, RIMA-dRCA, left RA-OM2, SVG-D2   FLEXIBLE SIGMOIDOSCOPY N/A 01/18/2013   Procedure: FLEXIBLE SIGMOIDOSCOPY;  Surgeon: Elsie Cree, MD;  Location: [facility];  Service: Endoscopy;  Laterality: N/A;   HOT HEMOSTASIS N/A 01/18/2013   Procedure: HOT HEMOSTASIS (ARGON PLASMA COAGULATION/BICAP);  Surgeon: Elsie Cree, MD;  Location: [facility];  Service: Endoscopy;  Laterality: N/A;   INCISION AND DRAINAGE HIP Right 12/05/2020   Procedure: IRRIGATION AND DEBRIDEMENT RIGHT HIP;  Surgeon: Jerri Kay HERO, MD;  Location: [facility];  Service: Orthopedics;  Laterality: Right;   INGUINAL HERNIA REPAIR Bilateral    inguinal   LEFT HEART CATH AND CORS/GRAFTS ANGIOGRAPHY N/A 07/21/2022   Procedure: LEFT HEART CATH AND CORS/GRAFTS ANGIOGRAPHY;  Surgeon: Ladona Heinz, MD;  Location: [facility];  Service: Cardiovascular;  Laterality: N/A;   LUNG SURGERY Left 01/29/2009   left lower lobectomy    TOTAL HIP ARTHROPLASTY Right 09/16/2020   Procedure: RIGHT TOTAL HIP ARTHROPLASTY ANTERIOR APPROACH;  Surgeon: Jerri Kay HERO, MD;  Location: [facility];  Service: Orthopedics;  Laterality: Right;    Family History  Problem Relation Age of Onset   Hypertension Mother    Dementia Mother    Lung cancer Father        Agent orange   Heart disease Brother    Heart disease Brother    Colon cancer Neg Hx    Esophageal cancer Neg Hx    Rectal cancer Neg Hx    Stomach cancer Neg Hx      Social Connections: Not on file      Current Outpatient Medications:    amLODipine  (NORVASC ) 10 MG tablet, Take 1 tablet (10 mg total) by mouth daily., Disp: 90 tablet, Rfl: 1   Carboxymethylcellulose Sodium 1 % GEL, Apply 1 drop to eye at bedtime as needed., Disp: , Rfl:     Cholecalciferol (VITAMIN D3) 25 MCG (1000 UT) CAPS, Take 1 capsule by mouth daily., Disp: , Rfl:    ciprofloxacin (CIPRO) 500 MG tablet, Take 500 mg by mouth 2 (two) times daily., Disp: , Rfl:    clopidogrel  (PLAVIX ) 75 MG tablet, Take 1 tablet (75 mg total) by mouth daily., Disp: 90 tablet, Rfl: 3   diphenhydramine -acetaminophen  (TYLENOL  PM) 25-500 MG TABS tablet, Take 2 tablets by mouth at bedtime as needed (sleep)., Disp: , Rfl:    ezetimibe  (ZETIA ) 10 MG tablet, Take 1 tablet (10 mg total) by mouth daily after supper., Disp: 90 tablet, Rfl: 3   hydrALAZINE  (APRESOLINE ) 50 MG tablet, Take 1 tablet (50 mg total) by mouth 3 (three) times daily., Disp: 270 tablet, Rfl: 3   metoprolol  (LOPRESSOR ) 50 MG tablet, Take 50 mg by mouth 2 (two) times daily. Hold if systolic blood pressure (top blood pressure number) less than 100 mmHg or heart rate less than 60 bpm (pulse)., Disp: , Rfl:    rosuvastatin  (CRESTOR ) 40 MG tablet, TAKE 1 TABLET BY MOUTH EVERYDAY AT BEDTIME, Disp: 90 tablet, Rfl: 1   tiotropium (SPIRIVA ) 18  MCG inhalation capsule, Place 18 mcg into inhaler and inhale daily., Disp: , Rfl:    vitamin B-12 (CYANOCOBALAMIN) 500 MCG tablet, Take 1,000 mcg by mouth daily., Disp: , Rfl:    Physical Exam:   There were no vitals taken for this visit.  Salient findings:  Strong voice, no cough during today's visit.   Seprately Identifiable Procedures:  Prior to initiating any procedures, risks/benefits/alternatives were explained to the patient and verbal consent obtained. Procedure Note - TFL Prior, not today  The procedure was undertaken to further evaluate above, with mirror exam inadequate for appropriate examination due to gag reflex and poor patient tolerance  Procedure:  Patient was identified as correct patient. Verbal consent was obtained. The nose was sprayed with oxymetazoline and 4% lidocaine . The The flexible laryngoscope was passed through the nose to view the nasal cavity, pharynx  (oropharynx, hypopharynx) and larynx.  The larynx was examined at rest and during multiple phonatory tasks. Documentation was obtained and reviewed with patient. The scope was removed. The patient tolerated the procedure well.  Findings: The nasal cavity and nasopharynx did not reveal any masses or lesions, mucosa appeared to be without obvious lesions. The tongue base, pharyngeal walls, piriform sinuses, vallecula, epiglottis and postcricoid region are normal in appearance. The visualized portion of the subglottis and proximal trachea is widely patent. The vocal folds are mobile bilaterally. Right posterior vocal fold with a small exophytic lesion but does not appear to be worrisome for carcinoma as it appears to be more polypoid    Electronically signed by: [name], MD 06/29/2024 3:34 PM   Impression & Plans:  Sean Gardner is a 68 y.o. male with:  1. Mass of right side of neck   2. Parotid nodule   3. Lesion of vocal fold    We again discussed the plan -- we discussed DDX - perhaps related to salivary gland, skin cancer v/s benign lesion. Given location and appearance and medical history as well as his diffuse facial skin lesions, do worry about carcinoma here.   Will poceed with right biopsy with DL/Bx with frozen section at time of surgery. If positive, will proceed with Neck dissection.  Note - if he does have skin cancer primary -- no large primaries currently appreciable and multiple possibilities for which derm is helping manage. Without known primary, do not think performing contralateral ND warranted and can treat as if CUP.  Parotid nodule: Warthin's tumor; will observe  Lesion of VF: most likely polypoid change from his smoking history; given location of neck mass, would not consider this to be the source of his neck LAD. Will do DL/Bx at time of surgery  We also discussed R/B/A for surgery, including pain, bleeding, infection, numbness, risks to major vascular structures  and life threatening bleed, tongue or lip weakness, chyle leak, injury to vagus or phrenic nerve, change in speech or swallowing, need for further procedures or treatment. We also discussed post-op management and expectations including need for drain. Patient understands all of this and is anxious to proceed  Cleared by cardiology.  He is ready to proceed - right SMG and neck mass excision, possible right ND; DL/Bx  See below regarding exact medications prescribed this encounter including dosages and route: No orders of the defined types were placed in this encounter.   Thank you for allowing me the opportunity to care for your patient. Please do not hesitate to contact me should you have any other questions.  Sincerely, Eldora Blanch,  MD Otolaryngologist (ENT), [facility] Phone: [phone] Fax: [phone]  06/29/2024, 3:34 PM   I have personally spent 21 minutes involved in non-face-to-face activities for this patient on the day of the visit.  Professional time spent excludes any procedures performed but includes the following activities, in addition to those noted in the documentation: preparing to see the patient (review of outside documentation and results), counseling, documenting in the electronic health record, independently interpreting results (CT).

## 2024-07-03 ENCOUNTER — Encounter (HOSPITAL_COMMUNITY): Payer: Self-pay

## 2024-07-03 NOTE — Progress Notes (Signed)
 PCP - Dr Alm Rav Cardiologist - Dr Gordy Bergamo (clearance on 06/02/24)  Chest x-ray - n/a EKG - 09/06/23 Stress Test - 06/08/22 ECHO - 05/28/22 Cardiac Cath - 07/21/22  ICD Pacemaker/Loop - n/a  Sleep Study -  Yes CPAP - uses CPAP nightly  Diabetes - n/a  Plavix  Instructions:  Hold Plavix  5 days prior to procedure.  Last dose will be on Saturday, 07/01/24.  Aspirin  Instructions: n/a  NPO  Anesthesia review: Yes, Isaiah saw this patient at PAT Appt.  STOP now taking any Aspirin  (unless otherwise instructed by your surgeon), Aleve, Naproxen, Ibuprofen , Motrin , Advil , Goody's, BC's, all herbal medications, fish oil, and all vitamins.   Coronavirus Screening Do you have any of the following symptoms:  Cough Yes - Isaiah saw PT in PAT  Fever (>100.96F)  yes/no: No Runny nose yes/no: No Sore throat yes/no: No Difficulty breathing/shortness of breath  occasional w/exertion  Have you traveled in the last 14 days and where? yes/no: No  Patient verbalized understanding of instructions that were given to them at the PAT appointment. Patient was also instructed that they will need to review over the PAT instructions again at home before surgery.

## 2024-07-03 NOTE — Progress Notes (Signed)
 Surgical Instructions   Your procedure is scheduled on Friday, 07/07/24. Report to [facility] Main Entrance A at 10:15 A.M., then check in with the Admitting office. Any questions or running late day of surgery: call [phone]  Questions prior to your surgery date: call [phone], Monday-Friday, 8am-4pm. If you experience any cold or flu symptoms such as cough, fever, chills, shortness of breath, etc. between now and your scheduled surgery, please notify us  at the above number.     Remember:  Do not eat or drink after midnight the night before your surgery-Thursday    Take these medicines the morning of surgery with A SIP OF WATER : amLODipine  (NORVASC )  cetirizine (ZYRTEC)  hydrALAZINE  (APRESOLINE )  metoprolol  (LOPRESSOR )  tiotropium (SPIRIVA ) Inhaler   May take these medicines IF NEEDED: PROMETHAZINE -DM) cough syrup   One week prior to surgery, STOP taking any Aspirin  (unless otherwise instructed by your surgeon) Aleve, Naproxen, Ibuprofen , Motrin , Advil , Goody's, BC's, all herbal medications, fish oil, and non-prescription vitamins.                     Do NOT Smoke (Tobacco/Vaping) for 24 hours prior to your procedure.  If you use a CPAP at night, you may bring your mask/headgear for your overnight stay.   You will be asked to remove any contacts, glasses, piercing's, hearing aid's, dentures/partials prior to surgery. Please bring cases for these items if needed.     SURGICAL WAITING ROOM VISITATION Patients may have no more than 2 support people in the waiting area - these visitors may rotate.   Pre-op nurse will coordinate an appropriate time for 1 ADULT support person, who may not rotate, to accompany patient in pre-op.  Children under the age of 34 must have an adult with them who is not the patient and must remain in the main waiting area with an adult.  If the patient needs to stay at the hospital during part of their recovery, the visitor guidelines for  inpatient rooms apply.  Please refer to the [facility] website for the visitor guidelines for any additional information.   If you received a COVID test during your pre-op visit  it is requested that you wear a mask when out in public, stay away from anyone that may not be feeling well and notify your surgeon if you develop symptoms. If you have been in contact with anyone that has tested positive in the last 10 days please notify you surgeon.      Pre-operative CHG Bathing Instructions   You can play a key role in reducing the risk of infection after surgery. Your skin needs to be as free of germs as possible. You can reduce the number of germs on your skin by washing with CHG (chlorhexidine  gluconate) soap before surgery. CHG is an antiseptic soap that kills germs and continues to kill germs even after washing.   DO NOT use if you have an allergy to chlorhexidine /CHG or antibacterial soaps. If your skin becomes reddened or irritated, stop using the CHG and notify one of our RNs at [phone].              TAKE A SHOWER THE NIGHT BEFORE SURGERY AND THE DAY OF SURGERY    Please keep in mind the following:  DO NOT shave, including legs and underarms, 48 hours prior to surgery.   You may shave your face before/day of surgery.  Place clean sheets on your bed the night before surgery Use a  clean washcloth (not used since being washed) for each shower. DO NOT sleep with pet's night before surgery.  CHG Shower Instructions:  Wash your face and private area with normal soap. If you choose to wash your hair, wash first with your normal shampoo.  After you use shampoo/soap, rinse your hair and body thoroughly to remove shampoo/soap residue.  Turn the water  OFF and apply half the bottle of CHG soap to a CLEAN washcloth.  Apply CHG soap ONLY FROM YOUR NECK DOWN TO YOUR TOES (washing for 3-5 minutes)  DO NOT use CHG soap on face, private areas, open wounds, or sores.  Pay special attention to  the area where your surgery is being performed.  If you are having back surgery, having someone wash your back for you may be helpful. Wait 2 minutes after CHG soap is applied, then you may rinse off the CHG soap.  Pat dry with a clean towel  Put on clean pajamas    Additional instructions for the day of surgery: DO NOT APPLY any lotions, deodorants, cologne, or perfumes.   Do not wear jewelry or makeup Do not wear nail polish, gel polish, artificial nails, or any other type of covering on natural nails (fingers and toes) Do not bring valuables to the hospital. [facility] is not responsible for valuables/personal belongings. Put on clean/comfortable clothes.  Please brush your teeth.  Ask your nurse before applying any prescription medications to the skin.

## 2024-07-04 ENCOUNTER — Encounter (HOSPITAL_COMMUNITY): Payer: Self-pay

## 2024-07-04 ENCOUNTER — Other Ambulatory Visit: Payer: Self-pay

## 2024-07-04 ENCOUNTER — Encounter (HOSPITAL_COMMUNITY)
Admission: RE | Admit: 2024-07-04 | Discharge: 2024-07-04 | Disposition: A | Discharge status: Home / Self Care | Source: Ambulatory Visit

## 2024-07-04 VITALS — BP 151/62 | HR 55 | Temp 98.0°F | Resp 19 | Ht 72.0 in | Wt 284.6 lb

## 2024-07-04 DIAGNOSIS — R9439 Abnormal result of other cardiovascular function study: Secondary | ICD-10-CM | POA: Diagnosis not present

## 2024-07-04 DIAGNOSIS — I4510 Unspecified right bundle-branch block: Secondary | ICD-10-CM | POA: Diagnosis not present

## 2024-07-04 DIAGNOSIS — I25810 Atherosclerosis of coronary artery bypass graft(s) without angina pectoris: Secondary | ICD-10-CM | POA: Insufficient documentation

## 2024-07-04 DIAGNOSIS — R221 Localized swelling, mass and lump, neck: Secondary | ICD-10-CM | POA: Diagnosis not present

## 2024-07-04 DIAGNOSIS — Z01818 Encounter for other preprocedural examination: Secondary | ICD-10-CM | POA: Insufficient documentation

## 2024-07-04 HISTORY — DX: Chronic obstructive pulmonary disease, unspecified: J44.9

## 2024-07-04 HISTORY — DX: Unspecified malignant neoplasm of skin, unspecified: C44.90

## 2024-07-04 LAB — BASIC METABOLIC PANEL WITH GFR
Anion gap: 6 (ref 5–15)
BUN: 14 mg/dL (ref 8–23)
CO2: 27 mmol/L (ref 22–32)
Calcium: 10.2 mg/dL (ref 8.9–10.3)
Chloride: 106 mmol/L (ref 98–111)
Creatinine, Ser: 0.9 mg/dL (ref 0.61–1.24)
GFR, Estimated: >60 mL/min (ref >60)
Glucose, Bld: 72 mg/dL (ref 70–99)
Potassium: 4.4 mmol/L (ref 3.5–5.1)
Sodium: 139 mmol/L (ref 135–145)

## 2024-07-04 LAB — CBC
HCT: 47.4 % (ref 39.0–52.0)
Hemoglobin: 15.8 g/dL (ref 13.0–17.0)
MCH: 32.6 pg (ref 26.0–34.0)
MCHC: 33.3 g/dL (ref 30.0–36.0)
MCV: 97.7 fL (ref 80.0–100.0)
Platelets: 257 K/uL (ref 150–400)
RBC: 4.85 MIL/uL (ref 4.22–5.81)
RDW: 14.4 % (ref 11.5–15.5)
WBC: 12 K/uL — ABNORMAL HIGH (ref 4.0–10.5)
nRBC: 0 % (ref 0.0–0.2)

## 2024-07-04 NOTE — Anesthesia Preprocedure Evaluation (Signed)
 Anesthesia Evaluation  Patient identified by MRN, date of birth, ID band Patient awake    Airway Mallampati: III  TM Distance: >3 FB Neck ROM: Full    Dental no notable dental hx.    Pulmonary sleep apnea , COPD, former smoker   Pulmonary exam normal        Cardiovascular hypertension, Pt. on medications and Pt. on home beta blockers + CAD, + Past MI, + CABG (2001) and + Peripheral Vascular Disease   Rhythm:Regular Rate:Normal  ECHO 2023: Echocardiogram 05/28/2022:  Normal LV systolic function with visual EF 55-60%. Left ventricle cavity  is normal in size. Moderate concentric hypertrophy of the left ventricle.  Normal global wall motion. Doppler evidence of grade I (impaired)  diastolic dysfunction, elevated LAP. Calculated EF 55%.  Left atrial cavity is moderately dilated at 5.8 cm. A lipomatous septum is  present.  Structurally normal tricuspid valve with trace regurgitation. No evidence   Left Heart Catheterization 07/21/22:  LV: 144/6, EDP 21 mmHg.  Ao 148/65, mean 98 mmHg.  No pressure gradient across the aortic valve.  EDP moderately elevated. LVEF: 55 to 60%. LM: Widely patent. LAD: Occluded in the proximal segment.  Very small diagonals.  D1 is occluded.  Mid to distal LAD supplied by LIMA to LAD.  D1 is supplied by SVG, it is occluded. CX: Moderate caliber vessel, OM1 is moderate to large, has mild diffuse disease.  OM 2 is occluded in the proximal segment.  Distal circumflex is diffusely diseased.  OM 2 is supplied by free radial graft.  Graft is patent. RCA: Very large caliber vessel and a superdominant vessel.  Mild disease is noted.  RIMA to RCA is functionally occluded.   LIMA to LAD: Patent, SVG to D1: Occluded, free radial graft to OM 2 patent, RIMA to RCA functionally occluded.   Impression: Abnormal nuclear stress test secondary to occluded SVG to D2.  Overall LVEF is preserved.  Hence overall low risk.   Continue medical management only.  Discontinue Plavix , continue aspirin  only.    Neuro/Psych negative neurological ROS  negative psych ROS   GI/Hepatic Neg liver ROS,GERD  ,,  Endo/Other  negative endocrine ROS    Renal/GU negative Renal ROS  negative genitourinary   Musculoskeletal negative musculoskeletal ROS (+)    Abdominal Normal abdominal exam  (+)   Peds  Hematology Lab Results      Component                Value               Date                      WBC                      12.0 (H)            07/04/2024                HGB                      15.8                07/04/2024                HCT                      47.4  07/04/2024                MCV                      97.7                07/04/2024                PLT                      257                 07/04/2024              Anesthesia Other Findings   Reproductive/Obstetrics                              Anesthesia Physical Anesthesia Plan  ASA: 3  Anesthesia Plan: General   Post-op Pain Management:    Induction: Intravenous  PONV Risk Score and Plan: 2 and Ondansetron , Dexamethasone , Midazolam  and Treatment may vary due to age or medical condition  Airway Management Planned: Mask and Oral ETT  Additional Equipment: None  Intra-op Plan:   Post-operative Plan: Extubation in OR  Informed Consent: I have reviewed the patients History and Physical, chart, labs and discussed the procedure including the risks, benefits and alternatives for the proposed anesthesia with the patient or authorized representative who has indicated his/her understanding and acceptance.     Dental advisory given  Plan Discussed with: CRNA  Anesthesia Plan Comments: (PAT note written 07/04/2024 by Haiven Nardone, PA-C.  )         Anesthesia Quick Evaluation

## 2024-07-04 NOTE — Progress Notes (Signed)
 Anesthesia PAT APP Evaluation:  Case: [id number] Date/Time: 07/07/24 1200   Procedures:      LYMPH NODE BIOPSY (Right) - Direct Laryngoscopy with possible biopsy, Right submandibular gland resection, right neck cervical lymph node biopsy, possible right neck dissection     EXCISION, SUBMANDIBULAR GLAND (Right)     LARYNGOSCOPY, DIRECT     DISSECTION, NECK, RADICAL (Right)   Anesthesia type: General   Diagnosis: Neck mass [R22.1]   Pre-op diagnosis: Neck mass   [facility] ROOM 08 / [facility]   Surgeons: [name], MD       DISCUSSION: Patient is a 68 year old male scheduled for the above procedure. He noticed a right neck lump ~ May 2025. He had a CT soft tissue neck done through the [facility] on 03/29/2024 that showed a lesion arising either within or abutting the right submandibular gland and a right parotid gland nodule. He was then referred to ENT Dr. Eldora Tobie. He underwent FNA of right parotid mass and right cervical/submandibular mass on 04/26/2024 by IR. 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 The right cervical/submandibular FNA showed rare atypical cell, majority of keratinized debris which could represent an epidermal inclusion cyst, but cannot completely exclude well-differentiated squamous cell carcinoma. He met with Dr. Tobie on 06/29/2024 and the above procedure was recommended.  There is also mention of possible tonsillectomy if frozen section is positive. Dr. Tobie noted patient had a cough the week prior which had resolved. No sore throat or breathing concerns.   Other history includes former smoker (quit 10/19/08), COPD, CAD (MI 2001, s/p CABG 08/18/00: LIMA-dLAD, RIMA-dRCA, left RA-OM2, SVG-D2; NSTEMI 08/13/10 with 99% SVG-D2, s/p unsuccessful thrombectomy; occluded SVG-D2, medical therapy 07/21/22), HTN, PVD/claudication (s/p left SFA stent 01/24/04), HLD, exertional dyspnea, neuroendocrine lung carcinoma (s/p LL lobectomy with LN dissection 01/29/09, 1+ LN, s/p  chemotherapy), OSA (uses CPAP), ostearthritis (right THA 09/16/20, s/p I&D for wound dehiscence post-fall), obesity.     Preoperative cardiology evaluation by Daneen Perkins, NP on 06/02/2024: According to the Revised Cardiac Risk Index (RCRI), his Perioperative Risk of Major Cardiac Event is (%): 0.9. His Functional Capacity in METs is: 6.36 according to the Duke Activity Status Index (DASI).Therefore, based on ACC/AHA guidelines, patient would be at acceptable risk for the planned procedure without further cardiovascular testing...  Per office protocol, he may hold Plavix  for 5 days prior to procedure. Please resume Plavix  as soon as possible postprocedure, at the discretion of the surgeon.  I evaluated him during his PAT visit due to fairly recent evaluation for cough (symptoms started before 06/22/2024). He says that he was evaluated about a week ago at an urgent care in [facility] for a productive cough x 3-4 days.  He said that this was really his only symptom. He denied fever, chills, sore throat, wheezing, runny nose. He does take Zyrtec and Spirivra daily, but says he does not typically have a baseline cough. Cough had whitish phlegm. No hemoptysis. Since he had upcoming surgery, he went to urgent care in hopes to get it addressed before surgery. He reportedly received as needed Promethazine -DM and a 6 day prednisone taper. He said he had one more dose of prednisone which he had not taken yet, so encouraged to complete his course as prescribed. He is no longer taking promethazine -DM, and is not taking any OTC medications. He says he feels well, although still with an occasional cough that can be productive. He denied any excessive coughing. No coughing  noted during my evaluation, although I did have him force a cough which was minimally productive. Faint coarseness in right lung base cleared following this cough. Lungs otherwise clear, no wheezing. No conversational dyspnea or hoarseness. He said he was  able to do a bit of walking and played 3 bowling rounds this past weekend.   He denied chest pain or any new symptoms since his cardiology evaluation. He denied SOB at rest. He has chronic DOE with more moderate activity or when weather is hot/humid. His grand daughter plays softball, so he gets a bit of walking here and there on the weekends. He also still does his yard work ([employer], riding mower). He denied LE edema. He denied limitations in mouth opening or neck movement. Mallampati II.  Last Plavix  planned for 07/01/2024.   As above, s/p prednisone for primary symptom of productive cough earlier this month with no other significant symptoms. He appeared well at his 07/04/2024 PAT visit, although still with minimal residual cough. He feels well and does not appear acutely ill. Surgery is for potential diagnosis of cancer. Discussed with anesthesiologist Keneth Duncans, MD. If patient continues to improve would anticipate he can proceed as planned. He was advised to continue Spiriva  as directed and practice deep breathing exercises. If any changes then advised to call with follow-up.   Anesthesia team to re-evaluate on the day of surgery.    VS: BP (!) 151/62   Pulse (!) 55   Temp 36.7 C   Resp 19   Ht 6' (1.829 m)   Wt 129.1 kg   SpO2 94%   BMI 38.60 kg/m  Provider wore face mask. Heart RRR, no murmur noted. No carotid bruit noted. See DISCUSSION.   PROVIDERS: Seabron Lenis, MD is PCP   Ladona Heinz, MD is cardiologist  Sherrod Sherrod, MD is DOUGLASS Darlean Sharper, MD is pulmonologist. Dasie Perkins, PA-C is [facility] GI provider. Last visit 06/22/2024 for follow-up IDA, colon polyps, history duodenal ulcer.    LABS: Labs reviewed: Acceptable for surgery.  (all labs ordered are listed, but only abnormal results are displayed)  Labs Reviewed  CBC - Abnormal; Notable for the following components:      Result Value   WBC 12.0 (*)    All other components within normal limits  BASIC  METABOLIC PANEL WITH GFR     IMAGES: CT Soft tissue neck 06/12/2024: IMPRESSION: 1. 2.5 x 2.5 x 3.1 cm partially cystic and solid mass arising adjacent to or possibly from the right submandibular gland. 2. 1 cm lesion in the right parotid gland. 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 3. No other adenopathy.    EKG: 09/06/2023: Normal sinus rhythm Right bundle branch block Stable inferolateral TWI compared to prior. Confirmed by Vannie Mora ([id number]) on 09/06/2023 1:32:48 PM   CV: Left Heart Catheterization 07/21/22:  LV: 144/6, EDP 21 mmHg.  Ao 148/65, mean 98 mmHg.  No pressure gradient across the aortic valve.  EDP moderately elevated. LVEF: 55 to 60%. LM: Widely patent. LAD: Occluded in the proximal segment.  Very small diagonals.  D1 is occluded.  Mid to distal LAD supplied by LIMA to LAD.  D1 is supplied by SVG, it is occluded. CX: Moderate caliber vessel, OM1 is moderate to large, has mild diffuse disease.  OM 2 is occluded in the proximal segment.  Distal circumflex is diffusely diseased.  OM 2 is supplied by free radial graft.  Graft is patent. RCA: Very large caliber vessel and a  superdominant vessel.  Mild disease is noted.  RIMA to RCA is functionally occluded.   LIMA to LAD: Patent, SVG to D1: Occluded, free radial graft to OM 2 patent, RIMA to RCA functionally occluded.   Impression: Abnormal nuclear stress test secondary to occluded SVG to D2.  Overall LVEF is preserved.  Hence overall low risk.  Continue medical management only.  Discontinue Plavix , continue aspirin  only.    Lexiscan  (with Mod Bruce protocol) Nuclear stress test 06/08/22 Myocardial perfusion is abnormal. High risk study. There is a fixed defect with no uptake in the apical cap in the apical region. There is a reversible severe defect in the anterior, lateral and inferior regions.  Overall LV systolic function is normal without regional wall motion abnormalities. Stress LV EF: 67%. TID ratio  1.26, which is abnormal. Nondiagnostic ECG stress. The heart rate response was consistent with Regadenoson . The blood pressure response was physiologic. No previous exam available for comparison.   Echocardiogram 05/28/2022:  Normal LV systolic function with visual EF 55-60%. Left ventricle cavity  is normal in size. Moderate concentric hypertrophy of the left ventricle.  Normal global wall motion. Doppler evidence of grade I (impaired)  diastolic dysfunction, elevated LAP. Calculated EF 55%.  Left atrial cavity is moderately dilated at 5.8 cm. A lipomatous septum is  present.  Structurally normal tricuspid valve with trace regurgitation. No evidence  of pulmonary hypertension.     Stress Testing Lexiscan  Myoview  12/20/2017: Gated SPECT imaging demonstrates hypokinesis of the apical anterior myocardial wall(s). The left ventricular ejection fraction was calculated or visually estimated to be 59%. SPECT images demonstrate small, fixed perfusion abnormality of moderate intensity in the apical anterior myocardial wall(s). SPECT images also demonstrate small, reversible perfusion abnormality of mild intensity in mid inferior, basal inferior myocardium. This suggests mild inferior ischemia, and possible anteroapical infarct. Low to intermediate risk study.    Past Medical History:  Diagnosis Date   Adenomatous colon polyp    CAD (coronary artery disease)    Claudication in peripheral vascular disease (HCC)    COPD (chronic obstructive pulmonary disease) (HCC)    GERD (gastroesophageal reflux disease)    Hyperlipidemia    Hypertension    Lung cancer (HCC)    neuroendocrine lung ca dx 2010   Myocardial infarction [facility]) 07/2000   Peripheral vascular disease (HCC)    Shortness of breath    on exertion,can't lay on his back   Sleep apnea    uses CPAP nightly    Past Surgical History:  Procedure Laterality Date   ACHILLES TENDON SURGERY Left    ANTERIOR HIP REVISION Right 11/13/2020    Procedure: IRRIGATION AND DEBRIDEMENT RIGHT HIP;  Surgeon: Jerri Kay HERO, MD;  Location: [facility];  Service: Orthopedics;  Laterality: Right;   COLONOSCOPY  10/02/2022   CORONARY ARTERY BYPASS GRAFT  08/18/2000   LIMA--dLIMA, RIMA-dRCA, left RA-OM2, SVG-D2   FLEXIBLE SIGMOIDOSCOPY N/A 01/18/2013   Procedure: FLEXIBLE SIGMOIDOSCOPY;  Surgeon: Elsie Cree, MD;  Location: [facility];  Service: Endoscopy;  Laterality: N/A;   HOT HEMOSTASIS N/A 01/18/2013   Procedure: HOT HEMOSTASIS (ARGON PLASMA COAGULATION/BICAP);  Surgeon: Elsie Cree, MD;  Location: [facility];  Service: Endoscopy;  Laterality: N/A;   INCISION AND DRAINAGE HIP Right 12/05/2020   Procedure: IRRIGATION AND DEBRIDEMENT RIGHT HIP;  Surgeon: Jerri Kay HERO, MD;  Location: [facility];  Service: Orthopedics;  Laterality: Right;   INGUINAL HERNIA REPAIR Bilateral    inguinal   LEFT HEART CATH AND CORS/GRAFTS ANGIOGRAPHY  N/A 07/21/2022   Procedure: LEFT HEART CATH AND CORS/GRAFTS ANGIOGRAPHY;  Surgeon: Ladona Heinz, MD;  [facility]: [facility];  Service: Cardiovascular;  Laterality: N/A;   LUNG SURGERY Left 01/29/2009   left lower lobectomy    TOTAL HIP ARTHROPLASTY Right 09/16/2020   Procedure: RIGHT TOTAL HIP ARTHROPLASTY ANTERIOR APPROACH;  Surgeon: Jerri Kay HERO, MD;  Location: [facility];  Service: Orthopedics;  Laterality: Right;   UPPER GI ENDOSCOPY  10/02/2022    MEDICATIONS:  amLODipine  (NORVASC ) 10 MG tablet   Carboxymethylcellulose Sodium 1 % GEL   cetirizine (ZYRTEC) 10 MG tablet   Cholecalciferol (VITAMIN D3) 25 MCG (1000 UT) CAPS   clopidogrel  (PLAVIX ) 75 MG tablet   ezetimibe  (ZETIA ) 10 MG tablet   hydrALAZINE  (APRESOLINE ) 50 MG tablet   metoprolol  (LOPRESSOR ) 50 MG tablet   NON FORMULARY   promethazine -dextromethorphan (PROMETHAZINE -DM) 6.25-15 MG/5ML syrup   rosuvastatin  (CRESTOR ) 40 MG tablet   tiotropium (SPIRIVA ) 18 MCG inhalation capsule   vitamin B-12 (CYANOCOBALAMIN) 500 MCG tablet   No current  facility-administered medications for this encounter.    Isaiah Ruder, PA-C Surgical Short Stay/Anesthesiology [facility] Phone [phone] [facility] Phone [phone] 07/04/2024 7:00 PM

## 2024-07-07 ENCOUNTER — Inpatient Hospital Stay (HOSPITAL_COMMUNITY): Admitting: Vascular Surgery

## 2024-07-07 ENCOUNTER — Other Ambulatory Visit: Payer: Self-pay

## 2024-07-07 ENCOUNTER — Encounter (HOSPITAL_COMMUNITY): Payer: Self-pay

## 2024-07-07 ENCOUNTER — Encounter (HOSPITAL_COMMUNITY)
Admission: RE | Disposition: A | Discharge status: Home / Self Care | Payer: Self-pay | Source: Home / Self Care | Attending: Otolaryngology

## 2024-07-07 ENCOUNTER — Inpatient Hospital Stay (HOSPITAL_COMMUNITY): Admitting: Certified Registered Nurse Anesthetist

## 2024-07-07 ENCOUNTER — Inpatient Hospital Stay (HOSPITAL_COMMUNITY)
Admission: RE | Admit: 2024-07-07 | Discharge: 2024-07-08 | DRG: 144 | Disposition: A | Discharge status: Home / Self Care | Attending: Otolaryngology | Admitting: Otolaryngology

## 2024-07-07 DIAGNOSIS — K219 Gastro-esophageal reflux disease without esophagitis: Secondary | ICD-10-CM | POA: Diagnosis present

## 2024-07-07 DIAGNOSIS — Z85118 Personal history of other malignant neoplasm of bronchus and lung: Secondary | ICD-10-CM

## 2024-07-07 DIAGNOSIS — Z87891 Personal history of nicotine dependence: Secondary | ICD-10-CM | POA: Diagnosis not present

## 2024-07-07 DIAGNOSIS — I10 Essential (primary) hypertension: Secondary | ICD-10-CM | POA: Diagnosis present

## 2024-07-07 DIAGNOSIS — C760 Malignant neoplasm of head, face and neck: Secondary | ICD-10-CM | POA: Diagnosis present

## 2024-07-07 DIAGNOSIS — C4492 Squamous cell carcinoma of skin, unspecified: Secondary | ICD-10-CM

## 2024-07-07 DIAGNOSIS — I739 Peripheral vascular disease, unspecified: Secondary | ICD-10-CM | POA: Diagnosis present

## 2024-07-07 DIAGNOSIS — E785 Hyperlipidemia, unspecified: Secondary | ICD-10-CM | POA: Diagnosis present

## 2024-07-07 DIAGNOSIS — Z96641 Presence of right artificial hip joint: Secondary | ICD-10-CM | POA: Diagnosis present

## 2024-07-07 DIAGNOSIS — J383 Other diseases of vocal cords: Secondary | ICD-10-CM

## 2024-07-07 DIAGNOSIS — I2510 Atherosclerotic heart disease of native coronary artery without angina pectoris: Secondary | ICD-10-CM

## 2024-07-07 DIAGNOSIS — R221 Localized swelling, mass and lump, neck: Secondary | ICD-10-CM | POA: Diagnosis present

## 2024-07-07 DIAGNOSIS — C770 Secondary and unspecified malignant neoplasm of lymph nodes of head, face and neck: Secondary | ICD-10-CM | POA: Diagnosis present

## 2024-07-07 DIAGNOSIS — K130 Diseases of lips: Secondary | ICD-10-CM

## 2024-07-07 DIAGNOSIS — C4402 Squamous cell carcinoma of skin of lip: Secondary | ICD-10-CM | POA: Diagnosis present

## 2024-07-07 DIAGNOSIS — Z85828 Personal history of other malignant neoplasm of skin: Secondary | ICD-10-CM

## 2024-07-07 DIAGNOSIS — Z951 Presence of aortocoronary bypass graft: Secondary | ICD-10-CM | POA: Diagnosis not present

## 2024-07-07 DIAGNOSIS — I252 Old myocardial infarction: Secondary | ICD-10-CM

## 2024-07-07 DIAGNOSIS — J449 Chronic obstructive pulmonary disease, unspecified: Secondary | ICD-10-CM | POA: Diagnosis present

## 2024-07-07 DIAGNOSIS — Z9889 Other specified postprocedural states: Principal | ICD-10-CM

## 2024-07-07 HISTORY — PX: LYMPH NODE BIOPSY: SHX201

## 2024-07-07 HISTORY — PX: SKIN BIOPSY: SHX1

## 2024-07-07 HISTORY — PX: SUBMANDIBULAR GLAND EXCISION: SHX2456

## 2024-07-07 HISTORY — PX: RADICAL NECK DISSECTION: SHX2284

## 2024-07-07 SURGERY — LYMPH NODE BIOPSY
Anesthesia: General | Site: Neck | Laterality: Right

## 2024-07-07 MED ORDER — UMECLIDINIUM BROMIDE 62.5 MCG/ACT IN AEPB
1.0000 | INHALATION_SPRAY | Freq: Every day | RESPIRATORY_TRACT | Status: DC
Start: 2024-07-08 — End: 2024-07-08
  Administered 2024-07-08: 1 via RESPIRATORY_TRACT
  Filled 2024-07-07: qty 7

## 2024-07-07 MED ORDER — PHENYLEPHRINE 80 MCG/ML (10ML) SYRINGE FOR IV PUSH (FOR BLOOD PRESSURE SUPPORT)
PREFILLED_SYRINGE | INTRAVENOUS | Status: DC | PRN
  Administered 2024-07-07 (×4): 160 ug via INTRAVENOUS

## 2024-07-07 MED ORDER — MIDAZOLAM HCL 2 MG/2ML IJ SOLN
INTRAMUSCULAR | Status: DC | PRN
  Administered 2024-07-07 (×2): 1 mg via INTRAVENOUS

## 2024-07-07 MED ORDER — CEFAZOLIN SODIUM 1 G IJ SOLR
INTRAMUSCULAR | Status: AC
  Filled 2024-07-07: qty 10

## 2024-07-07 MED ORDER — PROPOFOL 10 MG/ML IV BOLUS
INTRAVENOUS | Status: AC
Start: 2024-07-07 — End: 2024-07-07
  Filled 2024-07-07: qty 20

## 2024-07-07 MED ORDER — ALBUMIN HUMAN 5 % IV SOLN: INTRAVENOUS | Status: DC | PRN

## 2024-07-07 MED ORDER — ACETAMINOPHEN 160 MG/5ML PO SOLN
650.0000 mg | ORAL | Status: DC
  Administered 2024-07-07 – 2024-07-08 (×4): 650 mg via ORAL
  Filled 2024-07-07 (×4): qty 20.3

## 2024-07-07 MED ORDER — ACETAMINOPHEN 650 MG RE SUPP: 650.0000 mg | RECTAL | Status: DC

## 2024-07-07 MED ORDER — BACITRACIN ZINC 500 UNIT/GM EX OINT
TOPICAL_OINTMENT | CUTANEOUS | Status: AC
  Filled 2024-07-07: qty 28.35

## 2024-07-07 MED ORDER — 0.9 % SODIUM CHLORIDE (POUR BTL) OPTIME
TOPICAL | Status: DC | PRN
  Administered 2024-07-07: 1000 mL

## 2024-07-07 MED ORDER — FENTANYL CITRATE (PF) 250 MCG/5ML IJ SOLN
INTRAMUSCULAR | Status: AC
  Filled 2024-07-07: qty 5

## 2024-07-07 MED ORDER — ONDANSETRON HCL 4 MG/2ML IJ SOLN: 4.0000 mg | INTRAMUSCULAR | Status: DC | PRN

## 2024-07-07 MED ORDER — OXYCODONE HCL 5 MG/5ML PO SOLN
5.0000 mg | Freq: Four times a day (QID) | ORAL | Status: DC | PRN
  Administered 2024-07-07: 5 mg via ORAL
  Filled 2024-07-07: qty 5

## 2024-07-07 MED ORDER — SUGAMMADEX SODIUM 200 MG/2ML IV SOLN: INTRAVENOUS | Status: DC | PRN

## 2024-07-07 MED ORDER — AMLODIPINE BESYLATE 10 MG PO TABS
10.0000 mg | ORAL_TABLET | Freq: Every day | ORAL | Status: DC
  Administered 2024-07-08: 10 mg via ORAL
  Filled 2024-07-07: qty 1

## 2024-07-07 MED ORDER — LIDOCAINE 2% (20 MG/ML) 5 ML SYRINGE
INTRAMUSCULAR | Status: AC
  Filled 2024-07-07: qty 10

## 2024-07-07 MED ORDER — SUCCINYLCHOLINE CHLORIDE 200 MG/10ML IV SOSY
PREFILLED_SYRINGE | INTRAVENOUS | Status: DC | PRN
  Administered 2024-07-07: 200 mg via INTRAVENOUS

## 2024-07-07 MED ORDER — PROPOFOL 10 MG/ML IV BOLUS
INTRAVENOUS | Status: AC
  Filled 2024-07-07: qty 20

## 2024-07-07 MED ORDER — FENTANYL CITRATE (PF) 100 MCG/2ML IJ SOLN
INTRAMUSCULAR | Status: AC
  Filled 2024-07-07: qty 2

## 2024-07-07 MED ORDER — PROPOFOL 10 MG/ML IV BOLUS
INTRAVENOUS | Status: DC | PRN
  Administered 2024-07-07: 200 mg via INTRAVENOUS
  Administered 2024-07-07: 50 mg via INTRAVENOUS

## 2024-07-07 MED ORDER — DEXAMETHASONE SODIUM PHOSPHATE 10 MG/ML IJ SOLN
INTRAMUSCULAR | Status: DC | PRN
  Administered 2024-07-07: 10 mg via INTRAVENOUS

## 2024-07-07 MED ORDER — ROCURONIUM BROMIDE 10 MG/ML (PF) SYRINGE
PREFILLED_SYRINGE | INTRAVENOUS | Status: AC
  Filled 2024-07-07: qty 10

## 2024-07-07 MED ORDER — ONDANSETRON HCL 4 MG PO TABS: 4.0000 mg | ORAL_TABLET | ORAL | Status: DC | PRN

## 2024-07-07 MED ORDER — KETOROLAC TROMETHAMINE 30 MG/ML IJ SOLN
INTRAMUSCULAR | Status: AC
  Filled 2024-07-07: qty 1

## 2024-07-07 MED ORDER — PHENYLEPHRINE HCL-NACL 20-0.9 MG/250ML-% IV SOLN
INTRAVENOUS | Status: DC | PRN
  Administered 2024-07-07: 30 ug/min via INTRAVENOUS

## 2024-07-07 MED ORDER — DEXTROSE 5 % IV SOLN
INTRAVENOUS | Status: DC | PRN
  Administered 2024-07-07 (×2): 3 g via INTRAVENOUS

## 2024-07-07 MED ORDER — EPHEDRINE SULFATE-NACL 50-0.9 MG/10ML-% IV SOSY
PREFILLED_SYRINGE | INTRAVENOUS | Status: DC | PRN
  Administered 2024-07-07 (×2): 5 mg via INTRAVENOUS
  Administered 2024-07-07: 10 mg via INTRAVENOUS

## 2024-07-07 MED ORDER — METOPROLOL TARTRATE 50 MG PO TABS
50.0000 mg | ORAL_TABLET | Freq: Two times a day (BID) | ORAL | Status: DC
  Administered 2024-07-07 – 2024-07-08 (×2): 50 mg via ORAL
  Filled 2024-07-07 (×2): qty 1

## 2024-07-07 MED ORDER — LIDOCAINE 2% (20 MG/ML) 5 ML SYRINGE
INTRAMUSCULAR | Status: DC | PRN
  Administered 2024-07-07: 60 mg via INTRAVENOUS

## 2024-07-07 MED ORDER — LACTATED RINGERS IV SOLN: INTRAVENOUS | Status: DC

## 2024-07-07 MED ORDER — LACTATED RINGERS IV SOLN: INTRAVENOUS | Status: DC | PRN

## 2024-07-07 MED ORDER — ONDANSETRON HCL 4 MG/2ML IJ SOLN
INTRAMUSCULAR | Status: AC
  Filled 2024-07-07: qty 2

## 2024-07-07 MED ORDER — CHLORHEXIDINE GLUCONATE 0.12 % MT SOLN
15.0000 mL | Freq: Once | OROMUCOSAL | Status: AC
  Administered 2024-07-07: 15 mL via OROMUCOSAL
  Filled 2024-07-07: qty 15

## 2024-07-07 MED ORDER — SUCCINYLCHOLINE CHLORIDE 200 MG/10ML IV SOSY
PREFILLED_SYRINGE | INTRAVENOUS | Status: AC
  Filled 2024-07-07: qty 10

## 2024-07-07 MED ORDER — FENTANYL CITRATE (PF) 100 MCG/2ML IJ SOLN
25.0000 ug | INTRAMUSCULAR | Status: DC | PRN
  Administered 2024-07-07: 50 ug via INTRAVENOUS

## 2024-07-07 MED ORDER — DEXAMETHASONE SODIUM PHOSPHATE 10 MG/ML IJ SOLN
INTRAMUSCULAR | Status: AC
  Filled 2024-07-07: qty 1

## 2024-07-07 MED ORDER — FENTANYL CITRATE (PF) 250 MCG/5ML IJ SOLN
INTRAMUSCULAR | Status: DC | PRN
  Administered 2024-07-07: 100 ug via INTRAVENOUS
  Administered 2024-07-07 (×3): 50 ug via INTRAVENOUS

## 2024-07-07 MED ORDER — ORAL CARE MOUTH RINSE: 15.0000 mL | Freq: Once | OROMUCOSAL | Status: AC

## 2024-07-07 MED ORDER — MIDAZOLAM HCL 2 MG/2ML IJ SOLN
INTRAMUSCULAR | Status: AC
  Filled 2024-07-07: qty 2

## 2024-07-07 MED ORDER — ONDANSETRON HCL 4 MG/2ML IJ SOLN
INTRAMUSCULAR | Status: DC | PRN
  Administered 2024-07-07: 4 mg via INTRAVENOUS

## 2024-07-07 MED ORDER — ROCURONIUM BROMIDE 10 MG/ML (PF) SYRINGE
PREFILLED_SYRINGE | INTRAVENOUS | Status: DC | PRN
  Administered 2024-07-07: 30 mg via INTRAVENOUS

## 2024-07-07 MED ORDER — HYDRALAZINE HCL 50 MG PO TABS
50.0000 mg | ORAL_TABLET | Freq: Three times a day (TID) | ORAL | Status: DC
  Administered 2024-07-07 – 2024-07-08 (×2): 50 mg via ORAL
  Filled 2024-07-07 (×2): qty 1

## 2024-07-07 MED ORDER — TIOTROPIUM BROMIDE MONOHYDRATE 18 MCG IN CAPS: 18.0000 ug | ORAL_CAPSULE | Freq: Every day | RESPIRATORY_TRACT | Status: DC

## 2024-07-07 MED ORDER — ENOXAPARIN SODIUM 60 MG/0.6ML IJ SOSY
60.0000 mg | PREFILLED_SYRINGE | INTRAMUSCULAR | Status: DC
Start: 2024-07-08 — End: 2024-07-08
  Administered 2024-07-08: 60 mg via SUBCUTANEOUS
  Filled 2024-07-07: qty 0.6

## 2024-07-07 MED ORDER — BACITRACIN ZINC 500 UNIT/GM EX OINT
1.0000 | TOPICAL_OINTMENT | Freq: Two times a day (BID) | CUTANEOUS | Status: DC
  Administered 2024-07-07 – 2024-07-08 (×2): 1 via TOPICAL
  Filled 2024-07-07: qty 28.35

## 2024-07-07 MED ORDER — CEFAZOLIN SODIUM 1 G IJ SOLR
INTRAMUSCULAR | Status: AC
  Filled 2024-07-07: qty 30

## 2024-07-07 MED ORDER — SENNA 8.6 MG PO TABS
1.0000 | ORAL_TABLET | Freq: Two times a day (BID) | ORAL | Status: DC
  Administered 2024-07-07 – 2024-07-08 (×2): 8.6 mg via ORAL
  Filled 2024-07-07 (×2): qty 1

## 2024-07-07 MED ORDER — SUGAMMADEX SODIUM 200 MG/2ML IV SOLN
INTRAVENOUS | Status: DC | PRN
  Administered 2024-07-07: 200 mg via INTRAVENOUS

## 2024-07-07 MED ORDER — DOCUSATE SODIUM 100 MG PO CAPS
100.0000 mg | ORAL_CAPSULE | Freq: Two times a day (BID) | ORAL | Status: DC
  Administered 2024-07-07 – 2024-07-08 (×2): 100 mg via ORAL
  Filled 2024-07-07 (×2): qty 1

## 2024-07-07 MED ORDER — ACETAMINOPHEN 10 MG/ML IV SOLN: 1000.0000 mg | Freq: Once | INTRAVENOUS | Status: DC | PRN

## 2024-07-07 MED ORDER — IBUPROFEN 400 MG PO TABS: 400.0000 mg | ORAL_TABLET | Freq: Four times a day (QID) | ORAL | Status: DC | PRN

## 2024-07-07 SURGICAL SUPPLY — 52 items
BAG COUNTER SPONGE SURGICOUNT (BAG) ×4 IMPLANT
BLADE SURG 15 STRL LF DISP TIS (BLADE) ×4 IMPLANT
CANISTER SUCTION 3000ML PPV (SUCTIONS) ×4 IMPLANT
CATH ROBINSON RED A/P 10FR (CATHETERS) ×1 IMPLANT
CLEANER TIP ELECTROSURG 2X2 (MISCELLANEOUS) ×4 IMPLANT
CLIP TI MEDIUM 24 (CLIP) ×4 IMPLANT
CLIP TI WIDE RED SMALL 24 (CLIP) ×5 IMPLANT
CNTNR URN SCR LID CUP LEK RST (MISCELLANEOUS) ×3 IMPLANT
COAGULATOR SUCT SWTCH 10FR 6 (ELECTROSURGICAL) ×1 IMPLANT
CORD BIPOLAR FORCEPS 12FT (ELECTRODE) ×4 IMPLANT
COVER BACK TABLE 60X90IN (DRAPES) ×4 IMPLANT
COVER MAYO STAND STRL (DRAPES) ×4 IMPLANT
COVER SURGICAL LIGHT HANDLE (MISCELLANEOUS) ×4 IMPLANT
DRAPE HALF SHEET 40X57 (DRAPES) ×4 IMPLANT
DRSG TEGADERM 2-3/8X2-3/4 SM (GAUZE/BANDAGES/DRESSINGS) ×1 IMPLANT
DRSG TEGADERM 4X4.75 (GAUZE/BANDAGES/DRESSINGS) ×2 IMPLANT
ELECT COATED BLADE 2.86 ST (ELECTRODE) ×4 IMPLANT
ELECTRODE REM PT RTRN 9FT ADLT (ELECTROSURGICAL) ×4 IMPLANT
EVACUATOR SILICONE 100CC (DRAIN) ×1 IMPLANT
FORCEPS BIPOLAR SPETZLER 8 1.0 (NEUROSURGERY SUPPLIES) ×1 IMPLANT
GAUZE 4X4 16PLY ~~LOC~~+RFID DBL (SPONGE) ×6 IMPLANT
GAUZE SPONGE 2X2 8PLY STRL LF (GAUZE/BANDAGES/DRESSINGS) ×1 IMPLANT
GLOVE BIO SURGEON STRL SZ 6.5 (GLOVE) ×5 IMPLANT
GOWN STRL REUS W/ TWL LRG LVL3 (GOWN DISPOSABLE) ×9 IMPLANT
GOWN STRL REUS W/ TWL XL LVL3 (GOWN DISPOSABLE) ×4 IMPLANT
HEMOSTAT SNOW SURGICEL 2X4 (HEMOSTASIS) ×2 IMPLANT
KIT BASIN OR (CUSTOM PROCEDURE TRAY) ×4 IMPLANT
KIT TURNOVER KIT B (KITS) ×4 IMPLANT
MARKER SKIN DUAL TIP RULER LAB (MISCELLANEOUS) ×4 IMPLANT
NDL HYPO 25GX1X1/2 BEV (NEEDLE) ×3 IMPLANT
NEEDLE HYPO 25GX1X1/2 BEV (NEEDLE) ×4 IMPLANT
NS IRRIG 1000ML POUR BTL (IV SOLUTION) ×4 IMPLANT
PAD ARMBOARD POSITIONER FOAM (MISCELLANEOUS) ×9 IMPLANT
PAD MAGNETIC INSTR ST 16X20 (MISCELLANEOUS) ×1 IMPLANT
PENCIL SMOKE EVACUATOR (MISCELLANEOUS) ×4 IMPLANT
POSITIONER HEAD DONUT 9IN (MISCELLANEOUS) ×1 IMPLANT
PUNCH BIOPSY DISP 4 (MISCELLANEOUS) ×1 IMPLANT
RETRACTOR STAY HOOK 5MM (MISCELLANEOUS) ×4 IMPLANT
SPONGE INTESTINAL PEANUT (DISPOSABLE) ×4 IMPLANT
SPONGE TONSIL 1 RF SGL (DISPOSABLE) ×1 IMPLANT
STAPLER SKIN PROX 35W (STAPLE) ×4 IMPLANT
SUCTION TUBE FRAZIER 8FR DISP (SUCTIONS) ×1 IMPLANT
SUT SILK 2 0 PERMA HAND 18 BK (SUTURE) ×1 IMPLANT
SUT SILK 2-0 18XBRD TIE 12 (SUTURE) ×2 IMPLANT
SUT VIC AB 3-0 SH 8-18 (SUTURE) ×1 IMPLANT
SUT VIC AB 4-0 RB1 18 (SUTURE) ×1 IMPLANT
SUT VICRYL 3-0 RB1 18 ABS (SUTURE) ×1 IMPLANT
TOWEL GREEN STERILE FF (TOWEL DISPOSABLE) ×8 IMPLANT
TRAY ENT MC OR (CUSTOM PROCEDURE TRAY) ×4 IMPLANT
TUBE CONNECTING 12X1/4 (SUCTIONS) ×5 IMPLANT
WATER STERILE IRR 1000ML POUR (IV SOLUTION) ×4 IMPLANT
YANKAUER SUCT BULB TIP NO VENT (SUCTIONS) ×1 IMPLANT

## 2024-07-07 NOTE — Anesthesia Procedure Notes (Signed)
 Procedure Name: Intubation Date/Time: 07/07/2024 12:45 PM  Performed by: Boyce Shilling, CRNAPre-anesthesia Checklist: Patient identified, Emergency Drugs available, Suction available, Timeout performed and Patient being monitored Patient Re-evaluated:Patient Re-evaluated prior to induction Oxygen Delivery Method: Circle system utilized Preoxygenation: Pre-oxygenation with 100% oxygen Induction Type: IV induction Ventilation: Mask ventilation without difficulty Laryngoscope Size: Mac and 4 Grade View: Grade I Tube type: Oral Tube size: 7.5 mm Number of attempts: 1 Airway Equipment and Method: Stylet Placement Confirmation: ETT inserted through vocal cords under direct vision, positive ETCO2, CO2 detector and breath sounds checked- equal and bilateral Secured at: 23 cm Tube secured with: Tape Dental Injury: Teeth and Oropharynx as per pre-operative assessment

## 2024-07-07 NOTE — Plan of Care (Signed)
 ENT Note: Full op note to follow but right neck dissection, lip wedge excision and closure and DL/Bx performed Will admit for overnight observation. Has 1 suction drain. Deisha Stull B Jakera Beaupre

## 2024-07-07 NOTE — Op Note (Signed)
 Otolaryngology Operative note  Nancyann LITTIE Ada Date/Time of Admission: 07/07/2024 10:04 AM  CSN: [id number];MRN:[id number]  DOB: Apr 15, 1956 Age: 68 y.o. Location: [facility] OR    Pre-Op Diagnosis: Right Neck Mass, concerning for carcinoma Left lower lip lesion, concerning for carcinoma Vocal fold lesion - right  Post-Op Diagnosis: Squamous cell carcinoma of unknown primary Lower lip lesion, concerning for carcinoma Vocal fold lesion - right  Procedure: Right Modified Radical Neck Dissection (CPT 38724-RT) Wedge excision of Left Lower Lip with Primary Closure (CPT [phone]) Direct Laryngoscopy with Biopsy of Use of Operating Telescope (CPT [phone])  Surgeon: Eldora Blanch, MD  Anesthesia type:  General  Anesthesiologist: Anesthesiologist: Peggye Delon Brunswick, MD; Epifanio Charleston, MD; Dorethea Cordella SQUIBB, DO CRNA: Vertie Arthea RAMAN, CRNA; Delores Dus, CRNA; Boyce Shilling, CRNA; Rogge, Lonni PARAS, CRNA   Staff: Circulator: Gwenn Mardy SQUIBB, RN; Tanda Eleanor HERO, RN Scrub Person: Mercer Lilyan FALCON, CST; Starla Duwaine BROCKS, RN; [place], Norlina E Circulator Assistant: Ashley Alm PARAS, RN; Cass Blane BROCKS, RN RN First Assistant: Sherrine Moats, RN Implants: None  Specimens: ID Type Source Tests Collected by Time Destination  1 : Left lower lip biopsy - Assess for carcinoma Tissue (Frozen and permanent) PATH Skin biopsy SURGICAL PATHOLOGY Blanch Eldora NOVAK, MD 07/07/2024 1251   2 : RIGHT NECK MASS Tissue (Frozen and permanent) PATH Other SURGICAL PATHOLOGY Blanch Eldora NOVAK, MD 07/07/2024 1400   3 : RIGHT SUBMANDIBULAR GLAND Tissue PATH Other SURGICAL PATHOLOGY Blanch Eldora NOVAK, MD 07/07/2024 1420   4 : Right neck Level 1A, 2A, 1B, 3, 4 Tissue (permnanent) PATH Lymph node excision SURGICAL PATHOLOGY Blanch Eldora NOVAK, MD 07/07/2024 1615   5 : Left lower lip Tissue (permanent) PATH Skin biopsy SURGICAL PATHOLOGY Blanch Eldora NOVAK, MD 07/07/2024 1650   6 : Right vocal fold Tissue (permanent) PATH  Other SURGICAL PATHOLOGY Blanch Eldora NOVAK, MD 07/07/2024 1717     EBL:  150 mL  Drains: 19 Fr Suction Drain Right Neck  Post-op disposition and condition: PACU, hemodynamically stable  Findings: Right firm cystic neck mass - perifacial. Multiple other small nodes submandibular and level 2 and 3, but did not appear overtly firm on neck dissection Evidence of prior facial skin cancers with right lower lip excision and (possible closure) with scarring. Left lower lip with very superficial ulcerated lesion - 1x0.5 cm, concerning for carcinoma on frozen section. Excised and lip closed primarily. No other suspicious oropharyngeal or tongue base or hypopharynx lesions noted on direct laryngoscopy; small right polypoid right vocal fold lesion, biopsied.  Complications: None apparent  Indications and consent:  Macintyre Alexa is a 68 y.o. male with multiple skin cancers in the past (excised) with right neck mass with suspicious for carcinoma. The patient's options were discussed, including risks/benefits/alternatives for each option. Patient expressed understanding, and despite these risks, consented and decided to proceed with above procedures. Informed consent was signed before proceeding. The patient understood possibility of possible neck dissection if frozen section for neck mass revealed carcinoma and for possible tonsillectomy and biopsies of head and neck for concerning lesions if neck mass found caricnoma.  Procedure: The patient was identified in the preoperative area, consent confirmed, transported to the operating suite. They were transferred to the operating room table and placed in a supine position. After induction of general endotracheal anesthesia and establishment of airway, a surgeon initiated time out was performed.  The bed was rotated 90 degrees. The patient was then padded and draped appropriately for neck dissection. Prior to prepping,  the left lower lip lip lesion was biopsied and  sent for frozen section using a 4mm punch biopsy. It returned suspicious for carcinoma.   Next, a marking pen was used to mark the incision line in the direction of relaxed skin tension lines over a neck crease in the right neck.   A sharp knife was used to incise through the skin and dermis. Limited subplatysmal flaps were raised superiorly and inferiorly. Gentle dissection was used working toward the lymph node which was identified just superior to the submandibular gland but was distinct from it. It was in quite close proximity to the marginal mandibular nerve which was identified and protected. A small amount of platysma was left over the lymph node and taken with the lymph node in case it returned for carcinoma as the platysma was quite adherent to it. The capsule of the node was identified and the surrounding tissue was dissected free using sharp dissection and bipolar electrocautery A branch of the facial artery and the facial vein was going directly into this node so both were clipped, divided and ligated with 2-0 silk ties. The node was dissected off and was sent for frozen section, which showed squamous cell carcinoma, keratinizing. Decision was then made to perform a neck dissection on the right.  We first began with a level 1b neck dissection. The submandibular gland was already identified. The marginal mandibular nerve was first identified at the mandibular notch and traced medially and the anterior portion of it was already protected. Next, the submandibular fascia freed inferiorly and the gland was skeletonized in an inferior to superior direction. The mylohyoid muscle was identified anterior to the gland and the gland was dissected free from the muscle. The facial vein was identified immediately posterior to the gland and ligated. Next, the submandibular ganglion was located coming off of the lingual nerve. The ganglion and duct were then divided and ligated with silk suture. The gland was  passed off the field as a specimen. Next, the remaining perifacial fat and nodes from the inferior border of the mandible were dissected from superior to inferior while preserving marginal mandibular nerve down to the prior submandibular gland. Facial artery stump more posteriorly was identified and preserved. The specimen was then passed off the field. Given suspicion for skin origin, a submental lymph node dissection was also performed from inferior border of mandible to the hyoid in midline separating the nodal packet between the anterior bellies of digastric, which was then passed off the field as specimen. Submental vessels were controlled with clips and bipolar cautery. The posterior belly of the digastric muscle was then identified and followed posteriorly towards the mastoid. The anterior border of the sternocleidomastoid was then skeletonized. The spinal accessory nerve was then identified in its usual location and dissected superiorly. Proceeding in an inferior direction, the anterior border of the sternocleidomastoid was further skeletonized to the level of the deep cervical fascia and cervical rootlets found. The omohyoid was encountered and was kept but retracted. This marked the edges of our neck dissection borders, and the lymphatic contents of levels IIa and III, IV were then grasped and pulled anteriorly. These  contents were then carefully dissected free from the internal jugular vein, and carotid sheath. Working posteriorly, the nodes were freed up off the deep cervical fascia, just superficial to the deep cervical rootlets. The nodal packet was then lifted off the carotid sheath and the level 2A and 3 and 4 contents were removed. The internal jugular vein,  spinal accessory nerve, carotid artery and vagus nerve were preserved. The nodal packet was then passed of the field. Hemostasis was achieved with bipolar cautery. Next, we address level 2b. The spinal accessory nerve was located and the  lateral edge dissected and freed from the 2b lymph nodes. These were lifted off first from edge of the sternocleidomastoid, and then from the deep neck using sharp dissection. The nodal contents were then dissected off and passed off the field. Hemostasis was achieved with bipolar cautery.  The neck compartments were then irrigated, suctioned, and valsalva performed. Hemostasis was adequate. Surgicel SNoW was placed in the wound bed. One 19-Fr Feliciano drain was placed in the neck and brought out through the skin, securing it with silk suture. The neck was then closed in layers, using 3-0 Vicryl for the platysma, 4-0 Vicryl sutures for the dermis and staples for the skin.    Next, we returned to the lip and an approximately 5mm margin was marked and a wedge excision performed for the lip lesion. It was quite superficial so the intraoral mucosa was left intact. The wound was irrigated and hemostasis achieved with bipolar cautery. The orbicularis and dermis were reapproximated with 4-0 vicryl suture and skin closed with 5-0 prolene.   We then performed the direct laryngoscopy and biopsies. The oral cavity was first examined and no other abnormalities were identified. A mouth guard was placed to protect the maxillary gingiva. The Dedo laryngoscope was inserted into the oral cavity and advanced distally. Examination of the oral cavity, oropharynx, hypopharynx and larynx was performed in a sequential manner at that time. It revealed findings as above.. The operating telescope (0 degree) was used to examine the areas described above. Small cup forceps were used to obtain biopsies of the polypoid area on the right vocal fold and specimen passed off the field. Hemostasis was achieved with afrin pledgets. The hypopharynx and oropharynx were suctioned.  Based on the patient's history, and location of the node, suspicion appeared to highest for a skin carcinoma primary. As such, decision was made to defer a tonsillectomy  until final pathology results come back and further HPV testing. All instruments were then removed.  This concluded our procedure. The skin was cleansed and bacitracin  ointment applied to incisions. The patient tolerated the procedure well without any apparent immediate post operative complications.  The patient was rotated back to their original position, gently awakened from general anesthesia and taken to the PACU in stable condition. I was present and participated through the entirety of the procedure.

## 2024-07-07 NOTE — Telephone Encounter (Signed)
 Done

## 2024-07-07 NOTE — Progress Notes (Addendum)
 Pt has hx of sleep apnea and requesting to have cpap at bedtime. On call provider notified. Provider would not recommend to have full mask CPAP due to his lip wedge excision. Instead, provider advised to position pt on upright position and continuous pulse oximetry when pt asleep or pt can have nasal CPAP if available. Pt verbalized understanding. Will continue to monitor.

## 2024-07-07 NOTE — H&P (Addendum)
 Pre-Operative H&P - Day Of Surgery Patient Name: Sean Gardner Date:   07/07/2024  HPI: Sean Gardner is a 68 y.o. male who presents today for operative treatment of right neck mass, vocal fold lesion. Patient denies recent significant changes to health or significant new medications or physiologic change in condition which would immediately impact plans. No new types of therapy has been initiated that would change the plan or the appropriateness of the plan.   ROS:  A complete review of systems was obtained and is otherwise negative.   PMH:  Past Medical History:  Diagnosis Date   Adenomatous colon polyp    CAD (coronary artery disease)    Claudication in peripheral vascular disease (HCC)    COPD (chronic obstructive pulmonary disease) (HCC)    GERD (gastroesophageal reflux disease)    Hyperlipidemia    Hypertension    Lung cancer (HCC)    neuroendocrine lung ca dx 2010   Myocardial infarction [facility]) 07/2000   Peripheral vascular disease (HCC)    Shortness of breath    on exertion,can't lay on his back   Skin cancer    arms/face   Sleep apnea    uses CPAP nightly    PSH:  Past Surgical History:  Procedure Laterality Date   ACHILLES TENDON SURGERY Left    ANTERIOR HIP REVISION Right 11/13/2020   Procedure: IRRIGATION AND DEBRIDEMENT RIGHT HIP;  Surgeon: Jerri Kay HERO, MD;  Location: [facility] OR;  Service: Orthopedics;  Laterality: Right;   COLONOSCOPY  10/02/2022   CORONARY ARTERY BYPASS GRAFT  08/18/2000   LIMA--dLIMA, RIMA-dRCA, left RA-OM2, SVG-D2   FLEXIBLE SIGMOIDOSCOPY N/A 01/18/2013   Procedure: FLEXIBLE SIGMOIDOSCOPY;  Surgeon: Elsie Cree, MD;  Location: [facility];  Service: Endoscopy;  Laterality: N/A;   HOT HEMOSTASIS N/A 01/18/2013   Procedure: HOT HEMOSTASIS (ARGON PLASMA COAGULATION/BICAP);  Surgeon: Elsie Cree, MD;  Location: [facility];  Service: Endoscopy;  Laterality: N/A;   INCISION AND DRAINAGE HIP Right 12/05/2020   Procedure: IRRIGATION AND DEBRIDEMENT  RIGHT HIP;  Surgeon: Jerri Kay HERO, MD;  Location: [facility] OR;  Service: Orthopedics;  Laterality: Right;   INGUINAL HERNIA REPAIR Bilateral    inguinal   LEFT HEART CATH AND CORS/GRAFTS ANGIOGRAPHY N/A 07/21/2022   Procedure: LEFT HEART CATH AND CORS/GRAFTS ANGIOGRAPHY;  Surgeon: Ladona Heinz, MD;  Location: [facility];  Service: Cardiovascular;  Laterality: N/A;   LUNG SURGERY Left 01/29/2009   left lower lobectomy    TOTAL HIP ARTHROPLASTY Right 09/16/2020   Procedure: RIGHT TOTAL HIP ARTHROPLASTY ANTERIOR APPROACH;  Surgeon: Jerri Kay HERO, MD;  Location: [facility] OR;  Service: Orthopedics;  Laterality: Right;   UPPER GI ENDOSCOPY  10/02/2022    MEDS:   Current Facility-Administered Medications:    lactated ringers  infusion, , Intravenous, Continuous, Stoltzfus, Gregory P, DO  ALLERGIES: Lisinopril  and Zolpidem tartrate  EXAM: Vitals: BP (!) 156/71   Pulse 60   Temp 98.9 F (37.2 C) (Oral)   Resp 17   Ht 6' (1.829 m)   Wt 127 kg   SpO2 97%   BMI 37.97 kg/m   General Awake, at baseline alertness.   HEENT No scleral icterus or conjunctival hemorrhage. Globe position appears normal. External ears  normal. Nose patent without rhinorrhea. No lymphadenopathy. No thyromegaly  Cardiovascular No cyanosis.  Pulmonary No audible stridor. Breathing easily with no labor.  Neuro Symmetric facial movement.   Psychiatry Appropriate affect and mood.  Skin No scars or lesions on face or neck except for  small lower lip lesion, which persists today.  Extermities Moves all extremities with normal range of motion.   Other Findings None. Persistent right neck mass   Assessment & Plan: Sean Gardner has diagnoses of right neck mass, vocal fold lesion and will go to the OR today for direct laryngoscopy with biopsy and other indicated procedures, possible right, possible bilateral tonsillectomy, right neck mass and submandibular gland excision, possible right neck dissection.  He understands that if the  frozen section returns positive for carcinoma, we will complete the neck dissection 1B-4. Informed consent was obtained and available in EMR today. All questions have been answered, and risks/benefits/alternatives of procedure as noted in the consent were discussed in a quiet area. Questions were invited and answered. The patient expressed understanding, provided consent and wished to proceed despite risks.  [name] 07/07/2024 11:56 AM

## 2024-07-07 NOTE — Transfer of Care (Signed)
 Immediate Anesthesia Transfer of Care Note  Patient: Sean Gardner  Procedure(s) Performed: LYMPH NODE BIOPSY (Right) RIGHT SUBMANDIBULAR GLAND EXCISION (Right: Neck) RIGHT NECK RADICAL DISSECTION (Right: Neck) LEFT LOWER LIP BIOPSY (Left: Mouth)  Patient Location: PACU  Anesthesia Type:General  Level of Consciousness: awake and alert   Airway & Oxygen Therapy: Patient Spontanous Breathing and Patient connected to face mask oxygen  Post-op Assessment: Report given to RN and Post -op Vital signs reviewed and stable  Post vital signs: Reviewed and stable  Last Vitals:  Vitals Value Taken Time  BP 131/73 07/07/24 17:45  Temp    Pulse 79 07/07/24 18:00  Resp 16 07/07/24 18:00  SpO2 90 % 07/07/24 18:00  Vitals shown include unfiled device data.  Last Pain:  Vitals:   07/07/24 1055  TempSrc:   PainSc: 0-No pain      Patients Stated Pain Goal: 0 (07/07/24 1055)  Complications: No notable events documented.

## 2024-07-07 NOTE — Progress Notes (Incomplete Revision)
 Pt has hx of sleep apnea and requesting to have cpap at bedtime. On call provider notified. Provider would not recommend to have full mask CPAP due to his excision on his left lip instead, provider advise to position pt on upright position and continuous pulse oximetry when pt asleep.  Will continue to monitor.

## 2024-07-08 DIAGNOSIS — J383 Other diseases of vocal cords: Secondary | ICD-10-CM

## 2024-07-08 DIAGNOSIS — R221 Localized swelling, mass and lump, neck: Principal | ICD-10-CM

## 2024-07-08 DIAGNOSIS — K130 Diseases of lips: Secondary | ICD-10-CM

## 2024-07-08 DIAGNOSIS — C4492 Squamous cell carcinoma of skin, unspecified: Secondary | ICD-10-CM

## 2024-07-08 MED ORDER — BACITRACIN ZINC 500 UNIT/GM EX OINT: 1.0000 | TOPICAL_OINTMENT | Freq: Two times a day (BID) | CUTANEOUS | 0 refills | Status: AC

## 2024-07-08 MED ORDER — CLOPIDOGREL BISULFATE 75 MG PO TABS: 75.0000 mg | ORAL_TABLET | Freq: Every day | ORAL | 3 refills | Status: AC

## 2024-07-08 MED ORDER — ACETAMINOPHEN 500 MG PO TABS: 1000.0000 mg | ORAL_TABLET | Freq: Four times a day (QID) | ORAL | 2 refills | Status: DC | PRN

## 2024-07-08 MED ORDER — OXYCODONE HCL 5 MG PO TABS: 5.0000 mg | ORAL_TABLET | Freq: Three times a day (TID) | ORAL | 0 refills | Status: AC | PRN

## 2024-07-08 MED ORDER — IBUPROFEN 400 MG PO TABS: 400.0000 mg | ORAL_TABLET | Freq: Four times a day (QID) | ORAL | 0 refills | Status: AC | PRN

## 2024-07-08 MED ORDER — CEPHALEXIN 500 MG PO CAPS: 500.0000 mg | ORAL_CAPSULE | Freq: Three times a day (TID) | ORAL | 0 refills | Status: AC

## 2024-07-08 NOTE — Plan of Care (Signed)
 Problem: Education: Goal: Knowledge of General Education information will improve Description: Including pain rating scale, medication(s)/side effects and non-pharmacologic comfort measures 07/08/2024 1137 by Sebastian Daina LABOR, RN Outcome: Progressing 07/08/2024 1137 by Sebastian Daina LABOR, RN Outcome: Progressing   Problem: Health Behavior/Discharge Planning: Goal: Ability to manage health-related needs will improve 07/08/2024 1137 by Sebastian Daina LABOR, RN Outcome: Progressing 07/08/2024 1137 by Sebastian Daina LABOR, RN Outcome: Progressing   Problem: Clinical Measurements: Goal: Ability to maintain clinical measurements within normal limits will improve 07/08/2024 1137 by Sebastian Daina LABOR, RN Outcome: Progressing 07/08/2024 1137 by Sebastian Daina LABOR, RN Outcome: Progressing Goal: Will remain free from infection 07/08/2024 1137 by Sebastian Daina LABOR, RN Outcome: Progressing 07/08/2024 1137 by Sebastian Daina LABOR, RN Outcome: Progressing Goal: Diagnostic test results will improve 07/08/2024 1137 by Sebastian Daina LABOR, RN Outcome: Progressing 07/08/2024 1137 by Sebastian Daina LABOR, RN Outcome: Progressing Goal: Respiratory complications will improve 07/08/2024 1137 by Sebastian Daina LABOR, RN Outcome: Progressing 07/08/2024 1137 by Sebastian Daina LABOR, RN Outcome: Progressing Goal: Cardiovascular complication will be avoided 07/08/2024 1137 by Sebastian Daina LABOR, RN Outcome: Progressing 07/08/2024 1137 by Sebastian Daina LABOR, RN Outcome: Progressing   Problem: Activity: Goal: Risk for activity intolerance will decrease 07/08/2024 1137 by Sebastian Daina LABOR, RN Outcome: Progressing 07/08/2024 1137 by Sebastian Daina LABOR, RN Outcome: Progressing   Problem: Nutrition: Goal: Adequate nutrition will be maintained 07/08/2024 1137 by Sebastian Daina LABOR, RN Outcome: Progressing 07/08/2024 1137 by Sebastian Daina LABOR, RN Outcome: Progressing   Problem: Coping: Goal: Level of anxiety will  decrease 07/08/2024 1137 by Sebastian Daina LABOR, RN Outcome: Progressing 07/08/2024 1137 by Sebastian Daina LABOR, RN Outcome: Progressing   Problem: Elimination: Goal: Will not experience complications related to bowel motility 07/08/2024 1137 by Sebastian Daina LABOR, RN Outcome: Progressing 07/08/2024 1137 by Sebastian Daina LABOR, RN Outcome: Progressing Goal: Will not experience complications related to urinary retention 07/08/2024 1137 by Sebastian Daina LABOR, RN Outcome: Progressing 07/08/2024 1137 by Sebastian Daina LABOR, RN Outcome: Progressing   Problem: Pain Managment: Goal: General experience of comfort will improve and/or be controlled 07/08/2024 1137 by Sebastian Daina LABOR, RN Outcome: Progressing 07/08/2024 1137 by Sebastian Daina LABOR, RN Outcome: Progressing   Problem: Safety: Goal: Ability to remain free from injury will improve 07/08/2024 1137 by Sebastian Daina LABOR, RN Outcome: Progressing 07/08/2024 1137 by Sebastian Daina LABOR, RN Outcome: Progressing   Problem: Skin Integrity: Goal: Risk for impaired skin integrity will decrease 07/08/2024 1137 by Sebastian Daina LABOR, RN Outcome: Progressing 07/08/2024 1137 by Sebastian Daina LABOR, RN Outcome: Progressing   Problem: Education: Goal: Knowledge of the prescribed therapeutic regimen will improve 07/08/2024 1137 by Sebastian Daina LABOR, RN Outcome: Progressing 07/08/2024 1137 by Sebastian Daina LABOR, RN Outcome: Progressing   Problem: Activity: Goal: Ability to tolerate increased activity will improve 07/08/2024 1137 by Sebastian Daina LABOR, RN Outcome: Progressing 07/08/2024 1137 by Sebastian Daina LABOR, RN Outcome: Progressing   Problem: Health Behavior/Discharge Planning: Goal: Identification of resources available to assist in meeting health care needs will improve 07/08/2024 1137 by Sebastian Daina LABOR, RN Outcome: Progressing 07/08/2024 1137 by Sebastian Daina LABOR, RN Outcome: Progressing   Problem: Nutrition: Goal: Maintenance of  adequate nutrition will improve 07/08/2024 1137 by Sebastian Daina LABOR, RN Outcome: Progressing 07/08/2024 1137 by Sebastian Daina LABOR, RN Outcome: Progressing   Problem: Clinical Measurements: Goal: Complications related to the disease process, condition or treatment will be avoided or minimized 07/08/2024 1137 by Sebastian Daina LABOR, RN Outcome: Progressing 07/08/2024 1137 by  Sebastian Maize A, RN Outcome: Progressing   Problem: Respiratory: Goal: Will regain and/or maintain adequate ventilation 07/08/2024 1137 by Sebastian Maize LABOR, RN Outcome: Progressing 07/08/2024 1137 by Sebastian Maize LABOR, RN Outcome: Progressing   Problem: Skin Integrity: Goal: Demonstration of wound healing without infection will improve 07/08/2024 1137 by Sebastian Maize LABOR, RN Outcome: Progressing 07/08/2024 1137 by Sebastian Maize LABOR, RN Outcome: Progressing

## 2024-07-08 NOTE — Discharge Instructions (Addendum)
 ENT Contact Info: ENT Front Desk Phone including questions about appointments or questions: [phone] - If after normal business hours (Monday-Friday after 5PM or Weekends/Holidays), please call same number and follow prompts for Patient Access Line. There is a physician on call for urgent matters. For life threatening emergencies, please call 911  Surgery Discharge Instructions:  Call clinic or return to ED if you: - develop a fever greater than 101.4 - have shaking chills or are feeling ill - become short of breath - have uncontrollable nausea or vomiting - can't hold down food or liquids or feel as though you are getting dehydrated - have leakage or drainage from wound - urine output of less than 30cc/hr for 12 hours - develop redness, pain at incision(s) or wound opens up/separates - any other acute events, problems, or concerns  Wound Care/Dressings/Drain Instructions:  - To take care of your incision/cut:  - Apply bacitracin  ointment to the neck and lip incision twice per day for 7 days. Then switch to vaseline. - Shower only neck down for a week. Do NOT get the incision wet for a week, especially the drain site - Take care of the drain as shown by the nurse. Empty it and record how much comes out. The drain can lighten to a kool aid or straw color   Medications: - Resume your regular home medications except as detailed in the medication reconciliation.  - For pain, take tylenol  1000mg  every 6 hours and Ibuprofen  400mg  every 6 hours. If that is not sufficient, a stronger pain medication has been prescribed to you (oxycodone  5mg  tablet every 6 hours as needed). Do not mix with any other narcotic medication. - Take keflex  500mg  tablet three times per day for 7 days.  - Restart your Plavix  in July 13, 2024.  Follow Up:  - A follow up appointment should be scheduled for you after discharge  Activity/Restrictions:  - Resume your regular activities, as tolerated. Avoid  heavy lifting or straining (more than 5 lbs) for 10 days.   Diet: - Resume your regular diet, as tolerated  Additional Instructions: - Please take an over the counter stool softener while taking narcotic pain medication - DO NOT MIX NARCOTIC PAIN MEDICATIONS OR TAKE NARCOTIC PRESCRIPTIONS AT THE SAME TIME - DO NOT DRIVE OR OPERATE HEAVY MACHINERY WHILE ON NARCOTICS  - DO NOT TAKE MORE THAN 4 GRAMS (4000mg ) OF TYLENOL  (ACETAMINOPHEN ) IN 24 HOURS

## 2024-07-08 NOTE — Discharge Summary (Signed)
 Physician Discharge Summary  Patient ID: Sean Gardner MRN: [id number] DOB/AGE: 70-Dec-1957 70 y.o.  Admit date: 07/07/2024 Discharge date: 07/08/2024  Admission Diagnoses:  Principal Problem:   S/P lymph node biopsy Active Problems:   Mass of right side of neck   Discharge Diagnoses:  Same  Surgeries: Procedure(s): Right Neck Dissection Lower Lip excision and closure Direct Laryngoscopy with Biopsy   Consultants: None  Discharged Condition: Stable  Hospital Course: Sean Gardner is an 68 y.o. male with neck mass who underwent above procedure. They were brought to the operating room on 07/07/2024 and underwent the above named procedures. He did well overnight and his pain was controlled without any incisional issues. He voided, did drink some, and otherwise was doing well with Drain output 57 cc for 12 hours post op. Joint decision was made to discharge the patient on POD 1 and return precautions discussed.  Physical Exam:  General: Awake and alert, no acute distress Neck: soft, flat, incision c/d/I, drain with ss output Nose: no purulence Lip incision c/d/I, prolenes in place Respiratory: Respiratory effort is normal. Lungs clear to auscultation.  Recent vital signs:  Vitals:   07/08/24 0626 07/08/24 0751  BP: 138/70 138/63  Pulse: 65 60  Resp: 13 16  Temp: 97.6 F (36.4 C) 97.6 F (36.4 C)  SpO2: 97% 94%    Recent laboratory studies:  Results for orders placed or performed during the hospital encounter of 07/04/24  CBC   Collection Time: 07/04/24 11:35 AM  Result Value Ref Range   WBC 12.0 (H) 4.0 - 10.5 K/uL   RBC 4.85 4.22 - 5.81 MIL/uL   Hemoglobin 15.8 13.0 - 17.0 g/dL   HCT 52.5 60.9 - 47.9 %   MCV 97.7 80.0 - 100.0 fL   MCH 32.6 26.0 - 34.0 pg   MCHC 33.3 30.0 - 36.0 g/dL   RDW 85.5 88.4 - 84.4 %   Platelets 257 150 - 400 K/uL   nRBC 0.0 0.0 - 0.2 %  Basic metabolic panel   Collection Time: 07/04/24 11:35 AM  Result Value Ref Range   Sodium 139  135 - 145 mmol/L   Potassium 4.4 3.5 - 5.1 mmol/L   Chloride 106 98 - 111 mmol/L   CO2 27 22 - 32 mmol/L   Glucose, Bld 72 70 - 99 mg/dL   BUN 14 8 - 23 mg/dL   Creatinine, Ser 9.09 0.61 - 1.24 mg/dL   Calcium  10.2 8.9 - 10.3 mg/dL   GFR, Estimated >39 >39 mL/min   Anion gap 6 5 - 15    Discharge Medications:   Allergies as of 07/08/2024       Reactions   Lisinopril  Other (See Comments)   Hyperkalemia   Zolpidem Tartrate Other (See Comments)   Felt fuzzy and hallucinations        Medication List     TAKE these medications    acetaminophen  500 MG tablet Commonly known as: TYLENOL  Take 2 tablets (1,000 mg total) by mouth every 6 (six) hours as needed.   amLODipine  10 MG tablet Commonly known as: NORVASC  Take 1 tablet (10 mg total) by mouth daily.   bacitracin  ointment Apply 1 Application topically 2 (two) times daily for 7 days.   Carboxymethylcellulose Sodium 1 % Gel Apply 1 drop to eye at bedtime as needed (dry eyes).   cephALEXin  500 MG capsule Commonly known as: KEFLEX  Take 1 capsule (500 mg total) by mouth 3 (three) times daily for 7 days.  cetirizine 10 MG tablet Commonly known as: ZYRTEC Take 10 mg by mouth daily.   clopidogrel  75 MG tablet Commonly known as: PLAVIX  Take 1 tablet (75 mg total) by mouth daily. Start taking on: July 12, 2024 What changed: These instructions start on July 12, 2024. If you are unsure what to do until then, ask your doctor or other care provider.   ezetimibe  10 MG tablet Commonly known as: ZETIA  Take 1 tablet (10 mg total) by mouth daily after supper.   hydrALAZINE  50 MG tablet Commonly known as: APRESOLINE  Take 1 tablet (50 mg total) by mouth 3 (three) times daily.   ibuprofen  400 MG tablet Commonly known as: ADVIL  Take 1 tablet (400 mg total) by mouth every 6 (six) hours as needed for mild pain (pain score 1-3) or moderate pain (pain score 4-6).   metoprolol  tartrate 50 MG tablet Commonly known as:  LOPRESSOR  Take 50 mg by mouth 2 (two) times daily. Hold if systolic blood pressure (top blood pressure number) less than 100 mmHg or heart rate less than 60 bpm (pulse).   NON FORMULARY Pt uses a cpap nightly   oxyCODONE  5 MG immediate release tablet Commonly known as: Roxicodone  Take 1 tablet (5 mg total) by mouth every 8 (eight) hours as needed for up to 7 days.   promethazine -dextromethorphan 6.25-15 MG/5ML syrup Commonly known as: PROMETHAZINE -DM Take 5 mLs by mouth 4 (four) times daily as needed for cough.   rosuvastatin  40 MG tablet Commonly known as: CRESTOR  TAKE 1 TABLET BY MOUTH EVERYDAY AT BEDTIME   tiotropium 18 MCG inhalation capsule Commonly known as: SPIRIVA  Place 18 mcg into inhaler and inhale daily.   vitamin B-12 500 MCG tablet Commonly known as: CYANOCOBALAMIN Take 500 mcg by mouth in the morning and at bedtime.   Vitamin D3 25 MCG (1000 UT) Caps Take 1,000 Units by mouth in the morning and at bedtime.               Discharge Care Instructions  (From admission, onward)           Start     Ordered   07/08/24 0000  Discharge wound care:       Comments: Apply bacitracin  ointment twice daily to neck and lip for 7 days, then switch to vaseline   07/08/24 0824            Diagnostic Studies: CT Soft Tissue Neck W Contrast Result Date: 06/20/2024 CLINICAL DATA:  Right neck mass, preoperative planning, Warthin's tumor right parotid gland EXAM: CT NECK WITH CONTRAST TECHNIQUE: Multidetector CT imaging of the neck was performed using the standard protocol following the bolus administration of intravenous contrast. RADIATION DOSE REDUCTION: This exam was performed according to the departmental dose-optimization program which includes automated exposure control, adjustment of the mA and/or kV according to patient size and/or use of iterative reconstruction technique. CONTRAST:  75mL OMNIPAQUE  IOHEXOL  300 MG/ML  SOLN COMPARISON:  03/29/2024 FINDINGS:  Pharynx: The nasopharynx, oropharynx and hypopharynx are normal Oral cavity/floor of mouth: Normal Larynx: Normal Salivary glands: There is a 2.5 x 2.5 x 3.1 cm partially cystic and partially solid mass arising arising adjacent to or possibly from the right submandibular gland. There is a 1 cm solid lesion in the right parotid gland. The left parotid gland and submandibular gland are normal. Thyroid : Normal Lymph nodes: No adenopathy Vascular: No significant abnormality Limited intracranial: No significant abnormality Visualized orbits: No significant abnormality Mastoids and visualized paranasal sinuses: No significant abnormality Skeleton:  No significant abnormality Upper chest: No significant abnormality Other: None IMPRESSION: 1. 2.5 x 2.5 x 3.1 cm partially cystic and solid mass arising adjacent to or possibly from the right submandibular gland. 2. 1 cm lesion in the right parotid gland. 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 3. No other adenopathy. Electronically Signed   By: Nancyann Burns M.D.   On: 06/20/2024 11:13    Disposition: Discharge disposition: 01-Home or Self Care       Discharge Instructions     Diet general   Complete by: As directed    Discharge wound care:   Complete by: As directed    Apply bacitracin  ointment twice daily to neck and lip for 7 days, then switch to vaseline   Increase activity slowly   Complete by: As directed           Signed: [name] 07/08/2024, 8:38 AM

## 2024-07-08 NOTE — Anesthesia Postprocedure Evaluation (Signed)
 Anesthesia Post Note  Patient: Sean Gardner  Procedure(s) Performed: LYMPH NODE BIOPSY (Right) RIGHT SUBMANDIBULAR GLAND EXCISION (Right: Neck) RIGHT NECK RADICAL DISSECTION (Right: Neck) LEFT LOWER LIP BIOPSY (Left: Mouth)     Patient location during evaluation: PACU Anesthesia Type: General Level of consciousness: awake and alert Pain management: pain level controlled Vital Signs Assessment: post-procedure vital signs reviewed and stable Respiratory status: spontaneous breathing, nonlabored ventilation, respiratory function stable and patient connected to nasal cannula oxygen Cardiovascular status: blood pressure returned to baseline and stable Postop Assessment: no apparent nausea or vomiting Anesthetic complications: no   No notable events documented.  Last Vitals:  Vitals:   07/08/24 0626 07/08/24 0751  BP: 138/70 138/63  Pulse: 65 60  Resp: 13 16  Temp: 36.4 C 36.4 C  SpO2: 97% 94%    Last Pain:  Vitals:   07/08/24 0751  TempSrc: Oral  PainSc:                  Keifer Habib E

## 2024-07-09 ENCOUNTER — Encounter (HOSPITAL_COMMUNITY): Payer: Self-pay | Admitting: Otolaryngology

## 2024-07-12 ENCOUNTER — Encounter (INDEPENDENT_AMBULATORY_CARE_PROVIDER_SITE_OTHER): Payer: Self-pay | Admitting: Physician Assistant

## 2024-07-12 ENCOUNTER — Ambulatory Visit (INDEPENDENT_AMBULATORY_CARE_PROVIDER_SITE_OTHER): Admitting: Physician Assistant

## 2024-07-12 VITALS — BP 149/68 | HR 54

## 2024-07-12 DIAGNOSIS — R221 Localized swelling, mass and lump, neck: Secondary | ICD-10-CM

## 2024-07-12 DIAGNOSIS — Z9889 Other specified postprocedural states: Secondary | ICD-10-CM

## 2024-07-12 NOTE — Progress Notes (Signed)
 Dear Dr. Seabron, Here is my assessment for our mutual patient, Sean Gardner. Thank you for allowing me the opportunity to care for your patient. Please do not hesitate to contact me should you have any other questions. Sincerely, Chyrl Cohen PA-C  Otolaryngology Clinic Note Referring provider: Dr. Seabron HPI:  Sean Gardner is a 68 y.o. male kindly referred by Dr. Seabron   The patient is a 68 year old gentleman seen in our office for postop follow-up status post right next section, lower lip excision and closure and direct laryngoscopy with biopsy by Dr. Tobie on 07/07/2024.  Patient is companied by his wife today.  He notes he has been doing well, denies any fever, he notes some discomfort at the site of the drain.  He reports that he last emptied his JP bulb yesterday afternoon around 3 to 4 PM, since that time he has had approximately 35 to 40 cc of serosanguineous fluid.   Independent Review of Additional Tests or Records:  None   PMH/Meds/All/SocHx/FamHx/ROS:   Past Medical History:  Diagnosis Date   Adenomatous colon polyp    CAD (coronary artery disease)    Claudication in peripheral vascular disease    COPD (chronic obstructive pulmonary disease) (HCC)    GERD (gastroesophageal reflux disease)    Hyperlipidemia    Hypertension    Lung cancer (HCC)    neuroendocrine lung ca dx 2010   Myocardial infarction [facility]) 07/2000   Peripheral vascular disease    Shortness of breath    on exertion,can't lay on his back   Skin cancer    arms/face   Sleep apnea    uses CPAP nightly     Past Surgical History:  Procedure Laterality Date   ACHILLES TENDON SURGERY Left    ANTERIOR HIP REVISION Right 11/13/2020   Procedure: IRRIGATION AND DEBRIDEMENT RIGHT HIP;  Surgeon: Jerri Kay HERO, MD;  Location: [facility] OR;  Service: Orthopedics;  Laterality: Right;   COLONOSCOPY  10/02/2022   CORONARY ARTERY BYPASS GRAFT  08/18/2000   LIMA--dLIMA, RIMA-dRCA, left RA-OM2, SVG-D2   FLEXIBLE  SIGMOIDOSCOPY N/A 01/18/2013   Procedure: FLEXIBLE SIGMOIDOSCOPY;  Surgeon: Elsie Cree, MD;  Location: [facility];  Service: Endoscopy;  Laterality: N/A;   HOT HEMOSTASIS N/A 01/18/2013   Procedure: HOT HEMOSTASIS (ARGON PLASMA COAGULATION/BICAP);  Surgeon: Elsie Cree, MD;  Location: [facility];  Service: Endoscopy;  Laterality: N/A;   INCISION AND DRAINAGE HIP Right 12/05/2020   Procedure: IRRIGATION AND DEBRIDEMENT RIGHT HIP;  Surgeon: Jerri Kay HERO, MD;  Location: [facility];  Service: Orthopedics;  Laterality: Right;   INGUINAL HERNIA REPAIR Bilateral    inguinal   LEFT HEART CATH AND CORS/GRAFTS ANGIOGRAPHY N/A 07/21/2022   Procedure: LEFT HEART CATH AND CORS/GRAFTS ANGIOGRAPHY;  Surgeon: Ladona Heinz, MD;  [facility]: [facility];  Service: Cardiovascular;  Laterality: N/A;   LUNG SURGERY Left 01/29/2009   left lower lobectomy    LYMPH NODE BIOPSY Right 07/07/2024   Procedure: LYMPH NODE BIOPSY;  Surgeon: [name], MD;  Location: [facility] OR;  Service: ENT;  Laterality: Right;  Direct Laryngoscopy with possible biopsy, Right submandibular gland resection, right neck cervical lymph node biopsy, possible right neck dissection   RADICAL NECK DISSECTION Right 07/07/2024   Procedure: RIGHT NECK RADICAL DISSECTION;  Surgeon: [name], MD;  Location: [facility] OR;  Service: ENT;  Laterality: Right;   SKIN BIOPSY Left 07/07/2024   Procedure: LEFT LOWER LIP BIOPSY;  Surgeon: [name], MD;  [facility];  Service: ENT;  Laterality: Left;   SUBMANDIBULAR GLAND EXCISION Right 07/07/2024   Procedure: RIGHT SUBMANDIBULAR GLAND EXCISION;  Surgeon: [name], MD;  Location: [facility] OR;  Service: ENT;  Laterality: Right;   TOTAL HIP ARTHROPLASTY Right 09/16/2020   Procedure: RIGHT TOTAL HIP ARTHROPLASTY ANTERIOR APPROACH;  Surgeon: Jerri Kay HERO, MD;  Location: [facility];  Service: Orthopedics;  Laterality: Right;   UPPER GI ENDOSCOPY  10/02/2022    Family History  Problem Relation Age  of Onset   Hypertension Mother    Dementia Mother    Lung cancer Father        Agent orange   Heart disease Brother    Heart disease Brother    Colon cancer Neg Hx    Esophageal cancer Neg Hx    Rectal cancer Neg Hx    Stomach cancer Neg Hx      Social Connections: Moderately Isolated (07/07/2024)   Social Connection and Isolation Panel    Frequency of Communication with Friends and Family: More than three times a week    Frequency of Social Gatherings with Friends and Family: Never    Attends Religious Services: Never    Database administrator or Organizations: No    Attends Engineer, structural: Never    Marital Status: Married      Current Outpatient Medications:    acetaminophen  (TYLENOL ) 500 MG tablet, Take 2 tablets (1,000 mg total) by mouth every 6 (six) hours as needed., Disp: 100 tablet, Rfl: 2   amLODipine  (NORVASC ) 10 MG tablet, Take 1 tablet (10 mg total) by mouth daily., Disp: 90 tablet, Rfl: 1   bacitracin  ointment, Apply 1 Application topically 2 (two) times daily for 7 days., Disp: 28 g, Rfl: 0   Carboxymethylcellulose Sodium 1 % GEL, Apply 1 drop to eye at bedtime as needed (dry eyes)., Disp: , Rfl:    cephALEXin  (KEFLEX ) 500 MG capsule, Take 1 capsule (500 mg total) by mouth 3 (three) times daily for 7 days., Disp: 21 capsule, Rfl: 0   cetirizine (ZYRTEC) 10 MG tablet, Take 10 mg by mouth daily., Disp: , Rfl:    Cholecalciferol (VITAMIN D3) 25 MCG (1000 UT) CAPS, Take 1,000 Units by mouth in the morning and at bedtime., Disp: , Rfl:    clopidogrel  (PLAVIX ) 75 MG tablet, Take 1 tablet (75 mg total) by mouth daily., Disp: 90 tablet, Rfl: 3   ezetimibe  (ZETIA ) 10 MG tablet, Take 1 tablet (10 mg total) by mouth daily after supper., Disp: 90 tablet, Rfl: 3   hydrALAZINE  (APRESOLINE ) 50 MG tablet, Take 1 tablet (50 mg total) by mouth 3 (three) times daily., Disp: 270 tablet, Rfl: 3   ibuprofen  (ADVIL ) 400 MG tablet, Take 1 tablet (400 mg total) by mouth every 6  (six) hours as needed for mild pain (pain score 1-3) or moderate pain (pain score 4-6)., Disp: 30 tablet, Rfl: 0   metoprolol  (LOPRESSOR ) 50 MG tablet, Take 50 mg by mouth 2 (two) times daily. Hold if systolic blood pressure (top blood pressure number) less than 100 mmHg or heart rate less than 60 bpm (pulse)., Disp: , Rfl:    NON FORMULARY, Pt uses a cpap nightly, Disp: , Rfl:    oxyCODONE  (ROXICODONE ) 5 MG immediate release tablet, Take 1 tablet (5 mg total) by mouth every 8 (eight) hours as needed for up to 7 days., Disp: 25 tablet, Rfl: 0   promethazine -dextromethorphan (PROMETHAZINE -DM) 6.25-15 MG/5ML syrup, Take 5 mLs by mouth 4 (  four) times daily as needed for cough., Disp: , Rfl:    rosuvastatin  (CRESTOR ) 40 MG tablet, TAKE 1 TABLET BY MOUTH EVERYDAY AT BEDTIME, Disp: 90 tablet, Rfl: 1   tiotropium (SPIRIVA ) 18 MCG inhalation capsule, Place 18 mcg into inhaler and inhale daily., Disp: , Rfl:    vitamin B-12 (CYANOCOBALAMIN) 500 MCG tablet, Take 500 mcg by mouth in the morning and at bedtime., Disp: , Rfl:    Physical Exam:   BP (!) 149/68 (BP Location: Left Arm) Comment: first attempt 149/68 second attempt 144/73  Pulse (!) 54   SpO2 95%   Pertinent Findings  CN II-XII intact Right neck incision with staples in place clean dry and intact no swelling, JP drain with serosanguineous output Lower lip incision with Prolene stitches in place clean dry and intact with no surrounding swelling No respiratory distress or stridor  Seprately Identifiable Procedures:  None  Impression & Plans:  Scottie Metayer is a 68 y.o. male with the following   Postop follow-up status post right neck dissection-  Patient doing well postoperatively.  Given his significant drain output I would recommend holding off pulling the drain.  I did consult Dr. Tobie who evaluated patient, he agrees we will wait another 48 hours and have him return to the office, at that time we will remove the staples as well.  The  patient is given strict return precautions.  He verbalized understanding and agreement to today's plan.   - f/u 2 days   Thank you for allowing me the opportunity to care for your patient. Please do not hesitate to contact me should you have any other questions.  Sincerely, Chyrl Cohen PA-C [facility] Phone: [phone] Fax: [phone]  07/12/2024, 11:40 AM

## 2024-07-14 ENCOUNTER — Encounter (INDEPENDENT_AMBULATORY_CARE_PROVIDER_SITE_OTHER): Payer: Self-pay | Admitting: Physician Assistant

## 2024-07-14 ENCOUNTER — Ambulatory Visit (INDEPENDENT_AMBULATORY_CARE_PROVIDER_SITE_OTHER): Admitting: Physician Assistant

## 2024-07-14 VITALS — BP 121/68 | HR 57 | Temp 97.6°F

## 2024-07-14 DIAGNOSIS — Z9889 Other specified postprocedural states: Secondary | ICD-10-CM

## 2024-07-14 DIAGNOSIS — R221 Localized swelling, mass and lump, neck: Secondary | ICD-10-CM

## 2024-07-14 MED ORDER — BACITRACIN 500 UNIT/GM EX OINT: 1.0000 | TOPICAL_OINTMENT | Freq: Two times a day (BID) | CUTANEOUS | 0 refills | Status: AC

## 2024-07-14 NOTE — Progress Notes (Signed)
 Dear Dr. Seabron, Here is my assessment for our mutual patient, Sean Gardner. Thank you for allowing me the opportunity to care for your patient. Please do not hesitate to contact me should you have any other questions. Sincerely, Chyrl Cohen PA-C  Otolaryngology Clinic Note Referring provider: Dr. Seabron HPI:  Sean Gardner is a 68 y.o. male kindly referred by Dr. Seabron   The patient is a 68 year old gentleman seen in our office for follow-up evaluation status post right neck dissection, lower lip excision and closure and direct laryngoscopy with biopsy by Dr. Tobie on 07/07/2024.  The patient was last seen in the office on 07/12/2024.  At that time his drain was putting out more than we felt comfortable removing it.  He is following up closely for drain removal and staple removal.  Since I saw him last he notes that the drain has significantly decreased his output, he notes they emptied it this morning and he had 15 cc over 24-hour period of clearish fluid.  He denies any neck swelling, no fever, no redness, no significant pain.   Independent Review of Additional Tests or Records:  Pathology 07/07/2024-  FINAL MICROSCOPIC DIAGNOSIS:  A. LIP, LEFT LOWER, BIOPSY:      Invasive squamous cell carcinoma with ulcer, moderately differentiated, keratinizing.      Squamous cell carcinoma in situ identified.  B. SOFT TISSUE MASS, RIGHT NECK, EXCISION:      Invasive squamous cell carcinoma, moderately differentiated, keratinizing.      Carcinoma arises from squamous cell carcinoma in situ in cystic structure.      One lymph node, positive for carcinoma with direct extension (0/1).      Surgical margin of resection is negative for carcinoma.  C. SUBMANDIBULAR GLAND, RIGHT, EXCISION:      Benign salivary gland tissue.      Benign lymphoid tissue.      Negative for malignancy.  D. RIGHT NECK DISSECTION, LEVELS 1A, 1B, 2A, 3 AND 4:      Thirty-two lymph nodes, negative for carcinoma  (0/32).  E. LIP, LEFT LOWER, EXCISION:      Squamous cell carcinoma in situ.      Carcinoma extends to mucosal margins.  F. VOCAL FOLD, RIGHT, BIOPSY:      Vocal fold polyp.      Negative for malignancy.      PMH/Meds/All/SocHx/FamHx/ROS:   Past Medical History:  Diagnosis Date   Adenomatous colon polyp    CAD (coronary artery disease)    Claudication in peripheral vascular disease    COPD (chronic obstructive pulmonary disease) (HCC)    GERD (gastroesophageal reflux disease)    Hyperlipidemia    Hypertension    Lung cancer (HCC)    neuroendocrine lung ca dx 2010   Myocardial infarction [facility]) 07/2000   Peripheral vascular disease    Shortness of breath    on exertion,can't lay on his back   Skin cancer    arms/face   Sleep apnea    uses CPAP nightly     Past Surgical History:  Procedure Laterality Date   ACHILLES TENDON SURGERY Left    ANTERIOR HIP REVISION Right 11/13/2020   Procedure: IRRIGATION AND DEBRIDEMENT RIGHT HIP;  Surgeon: Jerri Kay HERO, MD;  Location: [facility];  Service: Orthopedics;  Laterality: Right;   COLONOSCOPY  10/02/2022   CORONARY ARTERY BYPASS GRAFT  08/18/2000   LIMA--dLIMA, RIMA-dRCA, left RA-OM2, SVG-D2   FLEXIBLE SIGMOIDOSCOPY N/A 01/18/2013   Procedure: FLEXIBLE SIGMOIDOSCOPY;  Surgeon: Elsie Cree,  MD;  Location: [facility];  Service: Endoscopy;  Laterality: N/A;   HOT HEMOSTASIS N/A 01/18/2013   Procedure: HOT HEMOSTASIS (ARGON PLASMA COAGULATION/BICAP);  Surgeon: Elsie Cree, MD;  Location: [facility];  Service: Endoscopy;  Laterality: N/A;   INCISION AND DRAINAGE HIP Right 12/05/2020   Procedure: IRRIGATION AND DEBRIDEMENT RIGHT HIP;  Surgeon: Jerri Kay HERO, MD;  Location: [facility];  Service: Orthopedics;  Laterality: Right;   INGUINAL HERNIA REPAIR Bilateral    inguinal   LEFT HEART CATH AND CORS/GRAFTS ANGIOGRAPHY N/A 07/21/2022   Procedure: LEFT HEART CATH AND CORS/GRAFTS ANGIOGRAPHY;  Surgeon: Ladona Heinz, MD;  [facility]: [facility]  INVASIVE CV LAB;  Service: Cardiovascular;  Laterality: N/A;   LUNG SURGERY Left 01/29/2009   left lower lobectomy    LYMPH NODE BIOPSY Right 07/07/2024   Procedure: LYMPH NODE BIOPSY;  Surgeon: [name], MD;  Location: [facility] OR;  Service: ENT;  Laterality: Right;  Direct Laryngoscopy with possible biopsy, Right submandibular gland resection, right neck cervical lymph node biopsy, possible right neck dissection   RADICAL NECK DISSECTION Right 07/07/2024   Procedure: RIGHT NECK RADICAL DISSECTION;  Surgeon: [name], MD;  Location: [facility] OR;  Service: ENT;  Laterality: Right;   SKIN BIOPSY Left 07/07/2024   Procedure: LEFT LOWER LIP BIOPSY;  Surgeon: [name], MD;  Location: [facility] OR;  Service: ENT;  Laterality: Left;   SUBMANDIBULAR GLAND EXCISION Right 07/07/2024   Procedure: RIGHT SUBMANDIBULAR GLAND EXCISION;  Surgeon: [name], MD;  Location: [facility] OR;  Service: ENT;  Laterality: Right;   TOTAL HIP ARTHROPLASTY Right 09/16/2020   Procedure: RIGHT TOTAL HIP ARTHROPLASTY ANTERIOR APPROACH;  Surgeon: Jerri Kay HERO, MD;  [facility];  Service: Orthopedics;  Laterality: Right;   UPPER GI ENDOSCOPY  10/02/2022    Family History  Problem Relation Age of Onset   Hypertension Mother    Dementia Mother    Lung cancer Father        Agent orange   Heart disease Brother    Heart disease Brother    Colon cancer Neg Hx    Esophageal cancer Neg Hx    Rectal cancer Neg Hx    Stomach cancer Neg Hx      Social Connections: Moderately Isolated (07/07/2024)   Social Connection and Isolation Panel    Frequency of Communication with Friends and Family: More than three times a week    Frequency of Social Gatherings with Friends and Family: Never    Attends Religious Services: Never    Database administrator or Organizations: No    Attends Banker Meetings: Never    Marital Status: Married      Current Outpatient Medications:    bacitracin  500 UNIT/GM ointment, Apply  1 Application topically 2 (two) times daily., Disp: 15 g, Rfl: 0   acetaminophen  (TYLENOL ) 500 MG tablet, Take 2 tablets (1,000 mg total) by mouth every 6 (six) hours as needed., Disp: 100 tablet, Rfl: 2   amLODipine  (NORVASC ) 10 MG tablet, Take 1 tablet (10 mg total) by mouth daily., Disp: 90 tablet, Rfl: 1   bacitracin  ointment, Apply 1 Application topically 2 (two) times daily for 7 days., Disp: 28 g, Rfl: 0   Carboxymethylcellulose Sodium 1 % GEL, Apply 1 drop to eye at bedtime as needed (dry eyes)., Disp: , Rfl:    cephALEXin  (KEFLEX ) 500 MG capsule, Take 1 capsule (500 mg total) by mouth 3 (three) times daily for 7 days., Disp:  21 capsule, Rfl: 0   cetirizine (ZYRTEC) 10 MG tablet, Take 10 mg by mouth daily., Disp: , Rfl:    Cholecalciferol (VITAMIN D3) 25 MCG (1000 UT) CAPS, Take 1,000 Units by mouth in the morning and at bedtime., Disp: , Rfl:    clopidogrel  (PLAVIX ) 75 MG tablet, Take 1 tablet (75 mg total) by mouth daily., Disp: 90 tablet, Rfl: 3   ezetimibe  (ZETIA ) 10 MG tablet, Take 1 tablet (10 mg total) by mouth daily after supper., Disp: 90 tablet, Rfl: 3   hydrALAZINE  (APRESOLINE ) 50 MG tablet, Take 1 tablet (50 mg total) by mouth 3 (three) times daily., Disp: 270 tablet, Rfl: 3   ibuprofen  (ADVIL ) 400 MG tablet, Take 1 tablet (400 mg total) by mouth every 6 (six) hours as needed for mild pain (pain score 1-3) or moderate pain (pain score 4-6)., Disp: 30 tablet, Rfl: 0   metoprolol  (LOPRESSOR ) 50 MG tablet, Take 50 mg by mouth 2 (two) times daily. Hold if systolic blood pressure (top blood pressure number) less than 100 mmHg or heart rate less than 60 bpm (pulse)., Disp: , Rfl:    NON FORMULARY, Pt uses a cpap nightly, Disp: , Rfl:    oxyCODONE  (ROXICODONE ) 5 MG immediate release tablet, Take 1 tablet (5 mg total) by mouth every 8 (eight) hours as needed for up to 7 days., Disp: 25 tablet, Rfl: 0   promethazine -dextromethorphan (PROMETHAZINE -DM) 6.25-15 MG/5ML syrup, Take 5 mLs by  mouth 4 (four) times daily as needed for cough., Disp: , Rfl:    rosuvastatin  (CRESTOR ) 40 MG tablet, TAKE 1 TABLET BY MOUTH EVERYDAY AT BEDTIME, Disp: 90 tablet, Rfl: 1   tiotropium (SPIRIVA ) 18 MCG inhalation capsule, Place 18 mcg into inhaler and inhale daily., Disp: , Rfl:    vitamin B-12 (CYANOCOBALAMIN) 500 MCG tablet, Take 500 mcg by mouth in the morning and at bedtime., Disp: , Rfl:    Physical Exam:   BP 121/68 (BP Location: Right Arm)   Pulse (!) 57   Temp 97.6 F (36.4 C)   SpO2 94%   Pertinent Findings  CN II-XII intact Right neck incision with staples in place clean dry and intact no swelling, JP drain with serosanguineous output Lower lip incision with Prolene stitches in place clean dry and intact with no surrounding swelling No respiratory distress or strido  Seprately Identifiable Procedures:  None  Impression & Plans:  Slayton Lubitz is a 68 y.o. male with the following   Postop follow-up status post right neck dissection-  Patient doing well from a postoperative standpoint.  No signs of infection or fluid accumulation.  Very minimal drain output.  I removed the drain and the staples as well as the sutures in the lower lip.  His pathology results have returned, Dr. Tobie would like to discuss these with him, he will reach out Monday to discuss results and a plan moving forward.  I instructed them to reach out to the office if they develop any new or worsening signs or symptoms in the meantime.   - f/u PRN, phone call discussion with Dr. Tobie for pathology results.   Thank you for allowing me the opportunity to care for your patient. Please do not hesitate to contact me should you have any other questions.  Sincerely, Chyrl Cohen PA-C [facility] Phone: [phone] Fax: [phone]  07/14/2024, 10:43 AM

## 2024-07-17 ENCOUNTER — Ambulatory Visit (INDEPENDENT_AMBULATORY_CARE_PROVIDER_SITE_OTHER): Admitting: Otolaryngology

## 2024-07-17 DIAGNOSIS — R221 Localized swelling, mass and lump, neck: Secondary | ICD-10-CM

## 2024-07-17 DIAGNOSIS — Z9889 Other specified postprocedural states: Secondary | ICD-10-CM

## 2024-07-17 NOTE — Progress Notes (Signed)
 Discussed pathology results with Sean Gardner. Case added on at upcoming TB. Will call him Wednesday with recommendations. At this point, likely just PET. Possible radiation v/s observation but will see where TB discussion leads. Sean Gardner  [phone]

## 2024-07-19 ENCOUNTER — Other Ambulatory Visit: Payer: Self-pay

## 2024-07-19 DIAGNOSIS — C4492 Squamous cell carcinoma of skin, unspecified: Secondary | ICD-10-CM

## 2024-07-20 ENCOUNTER — Other Ambulatory Visit: Payer: Self-pay

## 2024-07-21 ENCOUNTER — Inpatient Hospital Stay (HOSPITAL_COMMUNITY)

## 2024-07-21 ENCOUNTER — Emergency Department (HOSPITAL_COMMUNITY)

## 2024-07-21 ENCOUNTER — Other Ambulatory Visit: Payer: Self-pay

## 2024-07-21 ENCOUNTER — Inpatient Hospital Stay (HOSPITAL_COMMUNITY)
Admission: EM | Admit: 2024-07-21 | Discharge: 2024-07-23 | DRG: 872 | Disposition: A | Discharge status: Home / Self Care | Attending: Family Medicine | Admitting: Family Medicine

## 2024-07-21 DIAGNOSIS — Z8589 Personal history of malignant neoplasm of other organs and systems: Secondary | ICD-10-CM | POA: Diagnosis not present

## 2024-07-21 DIAGNOSIS — Z7902 Long term (current) use of antithrombotics/antiplatelets: Secondary | ICD-10-CM

## 2024-07-21 DIAGNOSIS — Z87440 Personal history of urinary (tract) infections: Secondary | ICD-10-CM

## 2024-07-21 DIAGNOSIS — A419 Sepsis, unspecified organism: Principal | ICD-10-CM | POA: Diagnosis present

## 2024-07-21 DIAGNOSIS — R5381 Other malaise: Secondary | ICD-10-CM | POA: Diagnosis present

## 2024-07-21 DIAGNOSIS — J441 Chronic obstructive pulmonary disease with (acute) exacerbation: Secondary | ICD-10-CM | POA: Diagnosis present

## 2024-07-21 DIAGNOSIS — Z951 Presence of aortocoronary bypass graft: Secondary | ICD-10-CM

## 2024-07-21 DIAGNOSIS — I739 Peripheral vascular disease, unspecified: Secondary | ICD-10-CM | POA: Diagnosis present

## 2024-07-21 DIAGNOSIS — R6510 Systemic inflammatory response syndrome (SIRS) of non-infectious origin without acute organ dysfunction: Secondary | ICD-10-CM | POA: Diagnosis present

## 2024-07-21 DIAGNOSIS — E785 Hyperlipidemia, unspecified: Secondary | ICD-10-CM | POA: Diagnosis present

## 2024-07-21 DIAGNOSIS — Z87891 Personal history of nicotine dependence: Secondary | ICD-10-CM | POA: Diagnosis not present

## 2024-07-21 DIAGNOSIS — Z85118 Personal history of other malignant neoplasm of bronchus and lung: Secondary | ICD-10-CM | POA: Diagnosis not present

## 2024-07-21 DIAGNOSIS — Z79899 Other long term (current) drug therapy: Secondary | ICD-10-CM

## 2024-07-21 DIAGNOSIS — J439 Emphysema, unspecified: Secondary | ICD-10-CM | POA: Diagnosis present

## 2024-07-21 DIAGNOSIS — Z888 Allergy status to other drugs, medicaments and biological substances status: Secondary | ICD-10-CM

## 2024-07-21 DIAGNOSIS — I4510 Unspecified right bundle-branch block: Secondary | ICD-10-CM | POA: Diagnosis present

## 2024-07-21 DIAGNOSIS — G4733 Obstructive sleep apnea (adult) (pediatric): Secondary | ICD-10-CM | POA: Diagnosis present

## 2024-07-21 DIAGNOSIS — I2510 Atherosclerotic heart disease of native coronary artery without angina pectoris: Secondary | ICD-10-CM | POA: Diagnosis present

## 2024-07-21 DIAGNOSIS — R32 Unspecified urinary incontinence: Secondary | ICD-10-CM

## 2024-07-21 DIAGNOSIS — R519 Headache, unspecified: Secondary | ICD-10-CM | POA: Diagnosis present

## 2024-07-21 DIAGNOSIS — I517 Cardiomegaly: Secondary | ICD-10-CM | POA: Insufficient documentation

## 2024-07-21 DIAGNOSIS — I252 Old myocardial infarction: Secondary | ICD-10-CM | POA: Diagnosis not present

## 2024-07-21 DIAGNOSIS — Z96641 Presence of right artificial hip joint: Secondary | ICD-10-CM | POA: Diagnosis present

## 2024-07-21 DIAGNOSIS — Z789 Other specified health status: Secondary | ICD-10-CM

## 2024-07-21 DIAGNOSIS — I119 Hypertensive heart disease without heart failure: Secondary | ICD-10-CM | POA: Diagnosis present

## 2024-07-21 DIAGNOSIS — N3941 Urge incontinence: Secondary | ICD-10-CM | POA: Diagnosis present

## 2024-07-21 DIAGNOSIS — N390 Urinary tract infection, site not specified: Secondary | ICD-10-CM

## 2024-07-21 LAB — RESP PANEL BY RT-PCR (RSV, FLU A&B, COVID)  RVPGX2
Influenza A by PCR: NEGATIVE
Influenza B by PCR: NEGATIVE
Resp Syncytial Virus by PCR: NEGATIVE
SARS Coronavirus 2 by RT PCR: NEGATIVE

## 2024-07-21 LAB — URINALYSIS, W/ REFLEX TO CULTURE (INFECTION SUSPECTED)
Bacteria, UA: NONE SEEN
Bilirubin Urine: NEGATIVE
Glucose, UA: NEGATIVE mg/dL
Hgb urine dipstick: NEGATIVE
Ketones, ur: NEGATIVE mg/dL
Nitrite: NEGATIVE
Protein, ur: NEGATIVE mg/dL
Specific Gravity, Urine: 1.027 (ref 1.005–1.030)
WBC, UA: >50 WBC/hpf (ref 0–5)
pH: 6 (ref 5.0–8.0)

## 2024-07-21 LAB — CBC WITH DIFFERENTIAL/PLATELET
Abs Immature Granulocytes: 0.13 K/uL — ABNORMAL HIGH (ref 0.00–0.07)
Basophils Absolute: 0 K/uL (ref 0.0–0.1)
Basophils Relative: 0 %
Eosinophils Absolute: 0 K/uL (ref 0.0–0.5)
Eosinophils Relative: 0 %
HCT: 41 % (ref 39.0–52.0)
Hemoglobin: 13.4 g/dL (ref 13.0–17.0)
Immature Granulocytes: 1 %
Lymphocytes Relative: 4 %
Lymphs Abs: 0.7 K/uL (ref 0.7–4.0)
MCH: 32.7 pg (ref 26.0–34.0)
MCHC: 32.7 g/dL (ref 30.0–36.0)
MCV: 100 fL (ref 80.0–100.0)
Monocytes Absolute: 1.1 K/uL — ABNORMAL HIGH (ref 0.1–1.0)
Monocytes Relative: 6 %
Neutro Abs: 16.3 K/uL — ABNORMAL HIGH (ref 1.7–7.7)
Neutrophils Relative %: 89 %
Platelets: 236 K/uL (ref 150–400)
RBC: 4.1 MIL/uL — ABNORMAL LOW (ref 4.22–5.81)
RDW: 14.9 % (ref 11.5–15.5)
WBC: 18.2 K/uL — ABNORMAL HIGH (ref 4.0–10.5)
nRBC: 0 % (ref 0.0–0.2)

## 2024-07-21 LAB — COMPREHENSIVE METABOLIC PANEL WITH GFR
ALT: 22 U/L (ref 0–44)
AST: 19 U/L (ref 15–41)
Albumin: 3.3 g/dL — ABNORMAL LOW (ref 3.5–5.0)
Alkaline Phosphatase: 46 U/L (ref 38–126)
Anion gap: 10 (ref 5–15)
BUN: 11 mg/dL (ref 8–23)
CO2: 21 mmol/L — ABNORMAL LOW (ref 22–32)
Calcium: 9.4 mg/dL (ref 8.9–10.3)
Chloride: 107 mmol/L (ref 98–111)
Creatinine, Ser: 1.02 mg/dL (ref 0.61–1.24)
GFR, Estimated: >60 mL/min (ref >60)
Glucose, Bld: 94 mg/dL (ref 70–99)
Potassium: 3.9 mmol/L (ref 3.5–5.1)
Sodium: 138 mmol/L (ref 135–145)
Total Bilirubin: 1 mg/dL (ref 0.0–1.2)
Total Protein: 6.2 g/dL — ABNORMAL LOW (ref 6.5–8.1)

## 2024-07-21 LAB — PROTIME-INR
INR: 1 (ref 0.8–1.2)
Prothrombin Time: 13.9 s (ref 11.4–15.2)

## 2024-07-21 LAB — I-STAT CG4 LACTIC ACID, ED: Lactic Acid, Venous: 1.2 mmol/L (ref 0.5–1.9)

## 2024-07-21 MED ORDER — SODIUM CHLORIDE 0.9 % IV SOLN: 2.0000 g | INTRAVENOUS | Status: DC

## 2024-07-21 MED ORDER — IPRATROPIUM-ALBUTEROL 0.5-2.5 (3) MG/3ML IN SOLN: 3.0000 mL | RESPIRATORY_TRACT | Status: DC | PRN

## 2024-07-21 MED ORDER — EZETIMIBE 10 MG PO TABS
10.0000 mg | ORAL_TABLET | Freq: Every day | ORAL | Status: DC
  Administered 2024-07-22: 10 mg via ORAL
  Filled 2024-07-21: qty 1

## 2024-07-21 MED ORDER — ACETAMINOPHEN 325 MG PO TABS
650.0000 mg | ORAL_TABLET | Freq: Four times a day (QID) | ORAL | Status: DC | PRN
  Administered 2024-07-21: 650 mg via ORAL
  Filled 2024-07-21: qty 2

## 2024-07-21 MED ORDER — LACTATED RINGERS IV BOLUS (SEPSIS)
1000.0000 mL | Freq: Once | INTRAVENOUS | Status: AC
  Administered 2024-07-21: 1000 mL via INTRAVENOUS

## 2024-07-21 MED ORDER — AZITHROMYCIN 500 MG IV SOLR
500.0000 mg | INTRAVENOUS | Status: DC
  Administered 2024-07-21: 500 mg via INTRAVENOUS
  Filled 2024-07-21: qty 5

## 2024-07-21 MED ORDER — TIOTROPIUM BROMIDE MONOHYDRATE 18 MCG IN CAPS: 18.0000 ug | ORAL_CAPSULE | Freq: Every day | RESPIRATORY_TRACT | Status: DC

## 2024-07-21 MED ORDER — POLYVINYL ALCOHOL 1.4 % OP SOLN: 1.0000 [drp] | Freq: Every evening | OPHTHALMIC | Status: DC | PRN

## 2024-07-21 MED ORDER — CLOPIDOGREL BISULFATE 75 MG PO TABS
75.0000 mg | ORAL_TABLET | Freq: Every day | ORAL | Status: DC
  Administered 2024-07-22 – 2024-07-23 (×2): 75 mg via ORAL
  Filled 2024-07-21 (×2): qty 1

## 2024-07-21 MED ORDER — BACITRACIN ZINC 500 UNIT/GM EX OINT
1.0000 | TOPICAL_OINTMENT | Freq: Two times a day (BID) | CUTANEOUS | Status: DC
  Administered 2024-07-22 – 2024-07-23 (×3): 1 via TOPICAL
  Filled 2024-07-21: qty 28.4

## 2024-07-21 MED ORDER — IOHEXOL 350 MG/ML SOLN
80.0000 mL | Freq: Once | INTRAVENOUS | Status: AC | PRN
  Administered 2024-07-21: 80 mL via INTRAVENOUS

## 2024-07-21 MED ORDER — MELATONIN 3 MG PO TABS
3.0000 mg | ORAL_TABLET | Freq: Every day | ORAL | Status: DC
Start: 2024-07-22 — End: 2024-07-23
  Administered 2024-07-21 – 2024-07-22 (×2): 3 mg via ORAL
  Filled 2024-07-21 (×2): qty 1

## 2024-07-21 MED ORDER — ROSUVASTATIN CALCIUM 20 MG PO TABS
40.0000 mg | ORAL_TABLET | Freq: Every day | ORAL | Status: DC
Start: 2024-07-22 — End: 2024-07-23
  Administered 2024-07-22 – 2024-07-23 (×2): 40 mg via ORAL
  Filled 2024-07-21 (×2): qty 2

## 2024-07-21 MED ORDER — UMECLIDINIUM BROMIDE 62.5 MCG/ACT IN AEPB
1.0000 | INHALATION_SPRAY | Freq: Every day | RESPIRATORY_TRACT | Status: DC
  Administered 2024-07-22 – 2024-07-23 (×2): 1 via RESPIRATORY_TRACT
  Filled 2024-07-21: qty 7

## 2024-07-21 MED ORDER — BENZONATATE 100 MG PO CAPS
100.0000 mg | ORAL_CAPSULE | Freq: Three times a day (TID) | ORAL | Status: DC | PRN
  Administered 2024-07-21: 100 mg via ORAL
  Filled 2024-07-21: qty 1

## 2024-07-21 MED ORDER — CEFTRIAXONE SODIUM 2 G IJ SOLR
2.0000 g | Freq: Once | INTRAMUSCULAR | Status: AC
  Administered 2024-07-21: 2 g via INTRAVENOUS
  Filled 2024-07-21: qty 20

## 2024-07-21 MED ORDER — ENOXAPARIN SODIUM 60 MG/0.6ML IJ SOSY
60.0000 mg | PREFILLED_SYRINGE | INTRAMUSCULAR | Status: DC
  Administered 2024-07-22 – 2024-07-23 (×2): 60 mg via SUBCUTANEOUS
  Filled 2024-07-21 (×2): qty 0.6

## 2024-07-21 MED ORDER — IPRATROPIUM-ALBUTEROL 0.5-2.5 (3) MG/3ML IN SOLN
3.0000 mL | RESPIRATORY_TRACT | Status: DC
  Administered 2024-07-21: 3 mL via RESPIRATORY_TRACT
  Filled 2024-07-21: qty 3

## 2024-07-21 MED ORDER — LACTATED RINGERS IV SOLN: INTRAVENOUS | Status: DC

## 2024-07-21 NOTE — Sepsis Progress Note (Signed)
 Elink will follow per sepsis protocol.

## 2024-07-21 NOTE — ED Triage Notes (Signed)
 Pt arrived via EMS with reports of weakness and malaise that started yesterday. Pt is post op surgical tumor removal right side of neck 2 weeks ago. Pt experiencing fever at home of 103.8. Pt took tylenol  pta. Pt also experiencing chills, incontinence, and weakness.

## 2024-07-21 NOTE — Assessment & Plan Note (Addendum)
 Unknown source. Most likely pulmonary as patient has new whitish sputum production and increased shortness of breath. Febrile and hypotensive on admission. CXR showed no acute abnormality. EKG showed RBBB.  UA with small leukocytes. Wells score 2.5, moderate risk. - Admit to [facility], med/tele, attending Dr. Rosalynn - CTA PE to assess for PE and pulmonary abnormalities - Vital signs per floor - Ceftriaxone  (10/3 -) continue until cultures negative x2 days, consider atypical coverage pending CT  - Urine culture pending - Blood cultures collected - Fall precautions - Tylenol  650 mg Q6H PRN - AM CBC and BMP - s/p 4 L fluid bolus, continue 150 mL/hr, monitor volume status no prior hx of HF  - holding BP meds d/t initial hypotension

## 2024-07-21 NOTE — ED Provider Notes (Signed)
 [place] EMERGENCY DEPARTMENT AT [facility] Provider Note   CSN: [id number] Arrival date & time: 07/21/24  1558     Patient presents with: Code Sepsis   Sean Gardner is a 68 y.o. male history of hypertension, squamous cell carcinoma of the neck status post right mandibular gland excision 2 weeks ago here presenting with fever.  Patient states that yesterday he tried to go to a ball game and had some chills.  He states that he had to go home.  He then was laying in bed and had intermittent chills.  He also had an episode of urinary incontinence and urinary frequency and states that he gets frequent urinary tract infections as well as bacterial infections.  Patient has nonproductive cough.  Denies any abdominal pain or vomiting.  Patient was noted to be febrile 103 at home.  Patient was given Tylenol  1000 mg prior to arrival.   The history is provided by the patient.       Prior to Admission medications   Medication Sig Start Date End Date Taking? Authorizing Provider  acetaminophen  (TYLENOL ) 500 MG tablet Take 2 tablets (1,000 mg total) by mouth every 6 (six) hours as needed. 07/08/24 07/08/25  [name], MD  amLODipine  (NORVASC ) 10 MG tablet Take 1 tablet (10 mg total) by mouth daily. 12/26/21 07/07/24  Cantwell, Celeste C, PA-C  bacitracin  500 UNIT/GM ointment Apply 1 Application topically 2 (two) times daily. 07/14/24   Hedges, Reyes, PA-C  Carboxymethylcellulose Sodium 1 % GEL Apply 1 drop to eye at bedtime as needed (dry eyes).    [provider]  cetirizine (ZYRTEC) 10 MG tablet Take 10 mg by mouth daily.    [provider]  Cholecalciferol (VITAMIN D3) 25 MCG (1000 UT) CAPS Take 1,000 Units by mouth in the morning and at bedtime.    [provider]  clopidogrel  (PLAVIX ) 75 MG tablet Take 1 tablet (75 mg total) by mouth daily. 07/12/24   [name], MD  ezetimibe  (ZETIA ) 10 MG tablet Take 1 tablet (10 mg total) by mouth daily after  supper. 05/27/21   Ladona Heinz, MD  hydrALAZINE  (APRESOLINE ) 50 MG tablet Take 1 tablet (50 mg total) by mouth 3 (three) times daily. 11/05/21   Ladona Heinz, MD  ibuprofen  (ADVIL ) 400 MG tablet Take 1 tablet (400 mg total) by mouth every 6 (six) hours as needed for mild pain (pain score 1-3) or moderate pain (pain score 4-6). 07/08/24   [name], MD  metoprolol  (LOPRESSOR ) 50 MG tablet Take 50 mg by mouth 2 (two) times daily. Hold if systolic blood pressure (top blood pressure number) less than 100 mmHg or heart rate less than 60 bpm (pulse).    [provider]  NON FORMULARY Pt uses a cpap nightly    [provider]  promethazine -dextromethorphan (PROMETHAZINE -DM) 6.25-15 MG/5ML syrup Take 5 mLs by mouth 4 (four) times daily as needed for cough. 06/28/24   [provider]  rosuvastatin  (CRESTOR ) 40 MG tablet TAKE 1 TABLET BY MOUTH EVERYDAY AT BEDTIME 12/12/21   Ladona Heinz, MD  tiotropium (SPIRIVA ) 18 MCG inhalation capsule Place 18 mcg into inhaler and inhale daily.    [provider]  vitamin B-12 (CYANOCOBALAMIN) 500 MCG tablet Take 500 mcg by mouth in the morning and at bedtime.    [provider]    Allergies: Lisinopril  and Zolpidem tartrate    Review of Systems  Constitutional:  Positive for fever.  All other  systems reviewed and are negative.   Updated Vital Signs BP (!) 143/63   Pulse 79   Temp 98 F (36.7 C) (Oral)   Resp (!) 21   Ht 6' (1.829 m)   Wt 124.7 kg   SpO2 96%   BMI 37.30 kg/m   Physical Exam Vitals and nursing note reviewed.  HENT:     Head: Normocephalic.     Nose: Nose normal.     Mouth/Throat:     Mouth: Mucous membranes are dry.  Eyes:     Extraocular Movements: Extraocular movements intact.     Pupils: Pupils are equal, round, and reactive to light.  Neck:     Comments: Right mandibular gland surgical scar healing well with no obvious erythema or signs of cellulitis or fluctuance Cardiovascular:     Rate  and Rhythm: Regular rhythm. Tachycardia present.     Pulses: Normal pulses.     Heart sounds: Normal heart sounds.  Pulmonary:     Effort: Pulmonary effort is normal.     Breath sounds: Normal breath sounds.  Abdominal:     General: Abdomen is flat.     Palpations: Abdomen is soft.  Musculoskeletal:        General: Normal range of motion.     Cervical back: Normal range of motion and neck supple.  Skin:    General: Skin is warm.     Capillary Refill: Capillary refill takes less than 2 seconds.  Neurological:     General: No focal deficit present.     Mental Status: He is alert and oriented to person, place, and time.  Psychiatric:        Mood and Affect: Mood normal.        Behavior: Behavior normal.     (all labs ordered are listed, but only abnormal results are displayed) Labs Reviewed  COMPREHENSIVE METABOLIC PANEL WITH GFR - Abnormal; Notable for the following components:      Result Value   CO2 21 (*)    Total Protein 6.2 (*)    Albumin  3.3 (*)    All other components within normal limits  CBC WITH DIFFERENTIAL/PLATELET - Abnormal; Notable for the following components:   WBC 18.2 (*)    RBC 4.10 (*)    Neutro Abs 16.3 (*)    Monocytes Absolute 1.1 (*)    Abs Immature Granulocytes 0.13 (*)    All other components within normal limits  URINALYSIS, W/ REFLEX TO CULTURE (INFECTION SUSPECTED) - Abnormal; Notable for the following components:   Leukocytes,Ua SMALL (*)    All other components within normal limits  RESP PANEL BY RT-PCR (RSV, FLU A&B, COVID)  RVPGX2  CULTURE, BLOOD (ROUTINE X 2)  CULTURE, BLOOD (ROUTINE X 2)  URINE CULTURE  PROTIME-INR  I-STAT CG4 LACTIC ACID, ED    EKG: EKG Interpretation Date/Time:  Friday July 21 2024 16:27:26 EDT Ventricular Rate:  80 PR Interval:  187 QRS Duration:  140 QT Interval:  385 QTC Calculation: 445 R Axis:   -18  Text Interpretation: Sinus rhythm Ventricular premature complex Right bundle branch block No  significant change since last tracing Confirmed by Patt Alm DEL ([id number]) on 07/21/2024 4:29:43 PM  Radiology: [facility] Chest Port 1 View Result Date: 07/21/2024 CLINICAL DATA:  Questionable sepsis - evaluate for abnormality EXAM: PORTABLE CHEST - 1 VIEW COMPARISON:  None available. FINDINGS: No focal airspace consolidation, pleural effusion, or pneumothorax. Mild cardiomegaly. Sternotomy wires and CABG markers. Tortuous aorta with aortic atherosclerosis.  No acute fracture or destructive lesions. Multilevel thoracic osteophytosis. IMPRESSION: No acute cardiopulmonary abnormality. Electronically Signed   By: Rogelia Myers M.D.   On: 07/21/2024 16:48     Procedures   CRITICAL CARE Performed by: Alm VEAR Cave   Total critical care time: 45 minutes  Critical care time was exclusive of separately billable procedures and treating other patients.  Critical care was necessary to treat or prevent imminent or life-threatening deterioration.  Critical care was time spent personally by me on the following activities: development of treatment plan with patient and/or surrogate as well as nursing, discussions with consultants, evaluation of patient's response to treatment, examination of patient, obtaining history from patient or surrogate, ordering and performing treatments and interventions, ordering and review of laboratory studies, ordering and review of radiographic studies, pulse oximetry and re-evaluation of patient's condition.   Medications Ordered in the ED  lactated ringers  infusion ( Intravenous New Bag/Given 07/21/24 1628)  lactated ringers  bolus 1,000 mL (0 mLs Intravenous Stopped 07/21/24 1706)    And  lactated ringers  bolus 1,000 mL (0 mLs Intravenous Stopped 07/21/24 1759)    And  lactated ringers  bolus 1,000 mL (1,000 mLs Intravenous New Bag/Given 07/21/24 1802)    And  lactated ringers  bolus 1,000 mL (has no administration in time range)  cefTRIAXone  (ROCEPHIN ) 2 g in sodium chloride  0.9 %  100 mL IVPB (0 g Intravenous Stopped 07/21/24 1706)                                    Medical Decision Making KEHINDE BOWDISH is a 68 y.o. male here presenting with fever and chills and malaise.  Patient just had right mandibular gland removed 2 weeks ago.  Patient now has chills and malaise since yesterday.  Patient also has some urinary frequency.  Differential include sepsis from UTI versus bacteremia versus pneumonia.  Patient has no obvious signs of wound infection or deep space neck infection.  Plan to get CBC CMP and lactate and cultures and chest x-ray and urinalysis.  Will give IV antibiotics for UTI   6:51 PM Reviewed patient's labs and white blood cell count is 18,000.  UA is positive for UTI.  Chest x-ray is clear and COVID and flu are negative.  Patient will be admitted for sepsis from UTI.  Of note patient blood pressure is now 143/63 which is improved from 90/60 on arrival.    Problems Addressed: Sepsis, due to unspecified organism, unspecified whether acute organ dysfunction present [facility]): acute illness or injury Urinary tract infection without hematuria, site unspecified: acute illness or injury  Amount and/or Complexity of Data Reviewed Labs: ordered. Decision-making details documented in ED Course. Radiology: ordered and independent interpretation performed. Decision-making details documented in ED Course.  Risk Prescription drug management. Decision regarding hospitalization.    Final diagnoses:  None    ED Discharge Orders     None          Cave Alm Macho, MD 07/21/24 1857

## 2024-07-21 NOTE — Plan of Care (Signed)
 Patient presents to the emergency department from home after experiencing burning pain and overflow incontinence.  On arrival he had low blood pressure which made him meet sepsis criteria.  In the emergency department he was given a dose of Rocephin , he was febrile and hypotensive, so also got LR bolus.  General: A&O, NAD Cardiac: RRR, no m/r/g Respiratory: CTAB, normal WOB, no w/c/r GI: Soft, NTTP, non-distended  Extremities: NTTP, no peripheral edema.  Patient is currently stable, will handoff to night team for full admission.  Lucie Pinal, DO PGY-2, Family Medicine

## 2024-07-21 NOTE — H&P (Addendum)
 Hospital Admission History and Physical Service Pager: [phone]  Patient name: Sean Gardner Medical record number: [id number] Date of Birth: 11/05/1955 Age: 68 y.o. Gender: male  Primary Care Provider: [facility] Consultants: None Code Status: Full code which was confirmed with family if patient unable to confirm   Preferred Emergency Contact:  Contact Information     Name Relation Home Work Mobile   Mcquarrie,Lucinda  Cindi Spouse [phone]  [phone]      Other Contacts   None on File      Chief Complaint: chills  Differential and Medical Decision Making:  Sean Gardner is a 68 y.o. male presenting with chills that began yesterday evening, patient febrile and hypotensive on admission meeting SIRS criteria.   Differential for this patient's presentation of this includes COPD exacerbation (likely given history of COPD, new sputum production, and increased shortness of breath), pneumonia (less likely given no focal consolidation on chest x-ray, obtaining CTA PE to assess further), PE (possible given recent surgery with decreased mobility, malignancy, and chronic cough, obtaining CTA PE to assess further) and UTI (unlikely due to lack of symptoms, UA with only small leukocytes, urine culture pending to assess further).   Assessment & Plan SIRS (systemic inflammatory response syndrome) (HCC) Unknown source. Most likely pulmonary as patient has new whitish sputum production and increased shortness of breath. Febrile and hypotensive on admission. CXR showed no acute abnormality. EKG showed RBBB.  UA with small leukocytes. Wells score 2.5, moderate risk. - Admit to FMTS, med/tele, attending Dr. Rosalynn - CTA PE to assess for PE and pulmonary abnormalities - Vital signs per floor - Ceftriaxone  (10/3 -) continue until cultures negative x2 days, consider atypical coverage pending CT  - Urine culture pending - Blood cultures collected - Fall precautions - Tylenol  650 mg Q6H  PRN - AM CBC and BMP - s/p 4 L fluid bolus, continue 150 mL/hr, monitor volume status no prior hx of HF  - holding BP meds d/t initial hypotension  Chronic health problem HTN - holding home meds 2/2 hypotension: amlodipine  10 mg, hydralazine  50 mg 3 times daily, and metoprolol  50 mg twice daily  OSA - ordered CPAP nightly HLD - Home Crestor  40 mg and ezetimibe  10 mg daily S/p right mandibular gland excision and radial neck dissection 2 weeks ago - Home bacitracin  ointment twice daily  FEN/GI: Heart healthy  VTE Prophylaxis: Lovenox    Disposition: Med tele   History of Present Illness:  Sean Gardner is a 68 y.o. male presenting with chills and subjective fevers that began yesterday.  Patient states that he began having chills while driving yesterday and had to pull over and call his daughter.  Reports a fever of 103.  Patient took Tylenol  earlier today.  He also endorses having a headache earlier that is now resolved.  He was feeling so poorly that he called an ambulance to bring him to the hospital this morning.  Patient's wife was at bedside and helped provide history.    Patient had squamous cell carcinoma of the neck that was removed 2 weeks ago.  He has not been feeling well since.  He has been more fatigued with decreased appetite and the change in his taste.  Patient also endorses a cough for about 6 weeks that is productive of whitish sputum.  He has a history of COPD but states the sputum production is new for him.  Patient also reports shortness of breath with exertion, however, this has  been ongoing for him.  He endorses chest pressure that he describes as a heavy weight on his chest.  This chest pressure is not currently present, and he states that it comes and goes.  Patient has had a heart attack in 2001, and states that the chest pressure does not feel like his heart attack.  Patient also endorses urinary frequency and incontinence, but denies dysuria and hematuria.  Patient also  has had difficulty initiating urine stream.  In the ED, patient was febrile and hypotensive and code sepsis was initiated.  WBC 18.2 and neutrophils 16.3.  UA showed small leukocytes.  Chest x-ray showed no acute abnormalities.  Lactic acid was 1.2.  Flu and COVID were negative.  Blood cultures were obtained.  EKG showed RBBB.  Patient received 4 L LR bolus and was started on maintenance.  Patient received 2 g ceftriaxone .  Review Of Systems: Per HPI  Pertinent Past Medical History: Squamous cell carcinoma of the neck  Lung cancer HTN OSA (uses CPAP) CAD HLD MI (2001) PVD COPD  Remainder reviewed in history tab.   Pertinent Past Surgical History: S/p right mandibular gland excision and radial neck dissection 2 weeks ago CABG Bilateral Inguinal hernia repair Left lower lobectomy  R total hip arthroplasty   Remainder reviewed in history tab.   Pertinent Social History: Tobacco use: Former, quit 2010  Alcohol use: occasional  Other Substance use: denies Lives with wife and mother   Pertinent Family History: Mother - dementia, HTN Father - lung cancer  Brother - heart disease   Important Outpatient Medications: Amlodipine  10 mg Bacitracin  500 units ointment Plavix  75 mg Ezetimibe  10 mg Hydralazine  50 mg 3 times daily Metoprolol  50 mg twice daily Rosuvastatin  40 mg Spiriva  daily Vitamin B12 500 mcg  Objective: BP (!) 143/63   Pulse 79   Temp 98 F (36.7 C) (Oral)   Resp (!) 21   Ht 6' (1.829 m)   Wt 124.7 kg   SpO2 96%   BMI 37.30 kg/m  Exam: General: Ill-appearing male, NAD Eyes: Nonicteric, EOM grossly intact ENTM: Poor dentition Neck: Well-healing incision site on the right Cardiovascular: RRR, no M/R/G, no peripheral edema Respiratory: CTAB, normal work of breathing on room air Gastrointestinal: soft, nontender to palpation, nondistended MSK: Moves all limbs grossly equally Neuro: No gross focal deficits Psych: Mood and affect appropriate  Labs:   CBC BMET  Recent Labs  Lab 07/21/24 1418  WBC 18.2*  HGB 13.4  HCT 41.0  PLT 236   Recent Labs  Lab 07/21/24 1418  NA 138  K 3.9  CL 107  CO2 21*  BUN 11  CREATININE 1.02  GLUCOSE 94  CALCIUM  9.4    Pertinent additional labs: UA: Small leukocytes Lactic acid: 1.2  EKG: My own interpretation: Sinus rhythm, no ST elevation, RBBB.    Imaging Studies Performed:  Chest x-ray Impression from Radiologist: No acute cardiopulmonary abnormality.    My Interpretation: No consolidation, no pleural effusion, no opacities.    Lennie Raguel MATSU, DO 07/21/2024, 7:17 PM PGY-1, [facility] Family Medicine  FPTS Intern pager: [phone], text pages welcome Secure chat group [facility] Teaching Service

## 2024-07-21 NOTE — Assessment & Plan Note (Signed)
 HTN - holding home meds 2/2 hypotension: amlodipine  10 mg, hydralazine  50 mg 3 times daily, and metoprolol  50 mg twice daily  OSA - ordered CPAP nightly HLD - Home Crestor  40 mg and ezetimibe  10 mg daily S/p right mandibular gland excision and radial neck dissection 2 weeks ago - Home bacitracin  ointment twice daily

## 2024-07-21 NOTE — ED Notes (Signed)
 2W notified of pts arrival

## 2024-07-22 ENCOUNTER — Inpatient Hospital Stay (HOSPITAL_COMMUNITY)

## 2024-07-22 ENCOUNTER — Other Ambulatory Visit (HOSPITAL_COMMUNITY)

## 2024-07-22 ENCOUNTER — Encounter (HOSPITAL_COMMUNITY): Payer: Self-pay

## 2024-07-22 DIAGNOSIS — I4510 Unspecified right bundle-branch block: Secondary | ICD-10-CM

## 2024-07-22 DIAGNOSIS — J441 Chronic obstructive pulmonary disease with (acute) exacerbation: Secondary | ICD-10-CM | POA: Diagnosis not present

## 2024-07-22 DIAGNOSIS — I517 Cardiomegaly: Secondary | ICD-10-CM | POA: Insufficient documentation

## 2024-07-22 LAB — CBC
HCT: 36.3 % — ABNORMAL LOW (ref 39.0–52.0)
Hemoglobin: 12.1 g/dL — ABNORMAL LOW (ref 13.0–17.0)
MCH: 32.8 pg (ref 26.0–34.0)
MCHC: 33.3 g/dL (ref 30.0–36.0)
MCV: 98.4 fL (ref 80.0–100.0)
Platelets: 182 K/uL (ref 150–400)
RBC: 3.69 MIL/uL — ABNORMAL LOW (ref 4.22–5.81)
RDW: 14.8 % (ref 11.5–15.5)
WBC: 12.2 K/uL — ABNORMAL HIGH (ref 4.0–10.5)
nRBC: 0 % (ref 0.0–0.2)

## 2024-07-22 LAB — BASIC METABOLIC PANEL WITH GFR
Anion gap: 7 (ref 5–15)
BUN: 6 mg/dL — ABNORMAL LOW (ref 8–23)
CO2: 23 mmol/L (ref 22–32)
Calcium: 9.1 mg/dL (ref 8.9–10.3)
Chloride: 107 mmol/L (ref 98–111)
Creatinine, Ser: 0.77 mg/dL (ref 0.61–1.24)
GFR, Estimated: >60 mL/min (ref >60)
Glucose, Bld: 99 mg/dL (ref 70–99)
Potassium: 3.8 mmol/L (ref 3.5–5.1)
Sodium: 137 mmol/L (ref 135–145)

## 2024-07-22 LAB — ECHOCARDIOGRAM COMPLETE
Area-P 1/2: 3.34 cm2
Height: 72 in
S' Lateral: 3.7 cm
Weight: 4400 [oz_av]

## 2024-07-22 LAB — RESPIRATORY PANEL BY PCR

## 2024-07-22 LAB — URINE CULTURE: Culture: <10000 — AB

## 2024-07-22 LAB — STREP PNEUMONIAE URINARY ANTIGEN: Strep Pneumo Urinary Antigen: NEGATIVE

## 2024-07-22 LAB — MRSA NEXT GEN BY PCR, NASAL: MRSA by PCR Next Gen: NOT DETECTED

## 2024-07-22 LAB — HIV ANTIBODY (ROUTINE TESTING W REFLEX): HIV Screen 4th Generation wRfx: NONREACTIVE

## 2024-07-22 MED ORDER — POLYETHYLENE GLYCOL 3350 17 G PO PACK
17.0000 g | PACK | Freq: Every day | ORAL | Status: DC
  Administered 2024-07-22 – 2024-07-23 (×2): 17 g via ORAL
  Filled 2024-07-22 (×2): qty 1

## 2024-07-22 MED ORDER — PREDNISONE 20 MG PO TABS
40.0000 mg | ORAL_TABLET | Freq: Every day | ORAL | Status: DC
  Administered 2024-07-22 – 2024-07-23 (×2): 40 mg via ORAL
  Filled 2024-07-22 (×2): qty 2

## 2024-07-22 MED ORDER — PHENAZOPYRIDINE HCL 100 MG PO TABS
100.0000 mg | ORAL_TABLET | Freq: Three times a day (TID) | ORAL | Status: DC
  Administered 2024-07-23 (×2): 100 mg via ORAL
  Filled 2024-07-22 (×3): qty 1

## 2024-07-22 MED ORDER — SENNA 8.6 MG PO TABS
1.0000 | ORAL_TABLET | Freq: Every day | ORAL | Status: DC
  Administered 2024-07-22 – 2024-07-23 (×2): 8.6 mg via ORAL
  Filled 2024-07-22 (×2): qty 1

## 2024-07-22 MED ORDER — ONDANSETRON 4 MG PO TBDP
4.0000 mg | ORAL_TABLET | Freq: Three times a day (TID) | ORAL | Status: DC | PRN
  Administered 2024-07-22: 4 mg via ORAL
  Filled 2024-07-22: qty 1

## 2024-07-22 MED ORDER — AMLODIPINE BESYLATE 10 MG PO TABS
10.0000 mg | ORAL_TABLET | Freq: Every day | ORAL | Status: DC
  Administered 2024-07-22 – 2024-07-23 (×2): 10 mg via ORAL
  Filled 2024-07-22 (×2): qty 1

## 2024-07-22 MED ORDER — DOXYCYCLINE HYCLATE 100 MG PO TABS
100.0000 mg | ORAL_TABLET | Freq: Two times a day (BID) | ORAL | Status: DC
Start: 2024-07-22 — End: 2024-07-26
  Administered 2024-07-22 – 2024-07-23 (×3): 100 mg via ORAL
  Filled 2024-07-22 (×3): qty 1

## 2024-07-22 MED ORDER — METOPROLOL TARTRATE 50 MG PO TABS
50.0000 mg | ORAL_TABLET | Freq: Two times a day (BID) | ORAL | Status: DC
Start: 2024-07-22 — End: 2024-07-23
  Administered 2024-07-22 – 2024-07-23 (×3): 50 mg via ORAL
  Filled 2024-07-22: qty 1
  Filled 2024-07-22: qty 2
  Filled 2024-07-22: qty 1

## 2024-07-22 NOTE — Assessment & Plan Note (Signed)
 HTN -restart metoprolol  to tartrate 50 mg twice daily; holding home meds amlodipine  10 mg, hydralazine  50 mg 3 times daily OSA - ordered CPAP nightly HLD - Home Crestor  40 mg and ezetimibe  10 mg daily S/p right mandibular gland excision and radial neck dissection 2 weeks ago - Home bacitracin  ointment twice daily

## 2024-07-22 NOTE — Plan of Care (Signed)
   Problem: Education: Goal: Knowledge of General Education information will improve Description Including pain rating scale, medication(s)/side effects and non-pharmacologic comfort measures Outcome: Progressing

## 2024-07-22 NOTE — Plan of Care (Signed)

## 2024-07-22 NOTE — Progress Notes (Signed)
  Echocardiogram 2D Echocardiogram has been performed.  Sean Gardner 07/22/2024, 3:43 PM

## 2024-07-22 NOTE — Progress Notes (Addendum)
 Daily Progress Note Intern Pager: [phone]  Patient name: Sean Gardner Medical record number: [id number] Date of birth: 05-06-1956 Age: 68 y.o. Gender: male  Primary Care Provider: [facility] Consultants: None Code Status: Full code  Pt Overview and Major Events to Date:  10/3: Admitted to FMTS  Assessment and Plan: Sean Gardner is a 69 year old male with a past medical history squamous cell carcinoma of the neck s/p resection, lung cancer, HTN, OSA, CAD with prior MI, HLD, COPD and PVD meeting SIRS criteria upon presentation, likely respiratory source. Assessment & Plan COPD exacerbation (HCC) Initially meeting SIRS criteria, most likely respiratory component given denies intra-abdominal and urinary symptoms.  CT PE showed no acute PE but concern for bronchitis/small airway disease without focal consolidation.  Reassuringly normal lung exam and on room air this a.m.  Good p.o. intake, will d/c fluids.  Continue with respiratory/urinary antibiotic coverage pending urine culture results. - Continue CTX and azithromycin, narrow pending culture results - Start Prednisone 40mg  daily x 5 days - Urine culture pending - F/u Blood cultures - Fall precautions - Tylenol  650 mg Q6H PRN - AM CBC and BMP - d/c MIVF Cardiomegaly Noted on CT PE with interseptal thickening.  Asymptomatic. Echo ordered, pending. Chronic health problem HTN -restart metoprolol  to tartrate 50 mg twice daily; holding home meds amlodipine  10 mg, hydralazine  50 mg 3 times daily OSA - ordered CPAP nightly HLD - Home Crestor  40 mg and ezetimibe  10 mg daily S/p right mandibular gland excision and radial neck dissection 2 weeks ago - Home bacitracin  ointment twice daily   FEN/GI: Heart healthy PPx: Lovenox  Dispo: Pending clinical improvement  Subjective:  Assessed at bedside with wife present, sitting up in bed eating breakfast.  Speaking in full sentences without shortness of breath.  Reports  increased sputum production and shortness of breath in recent days.  Denies dysuria or hematuria.  Some reduced p.o. intake at home in the setting of recent resection of squamous cell carcinoma of the neck.  Objective: Temp:  [97.8 F (36.6 C)-101.2 F (38.4 C)] 97.8 F (36.6 C) (10/04 0411) Pulse Rate:  [68-93] 72 (10/04 0411) Resp:  [16-26] 20 (10/04 0411) BP: (90-151)/(62-102) 128/68 (10/04 0411) SpO2:  [96 %-100 %] 98 % (10/04 0411) FiO2 (%):  [28 %] 28 % (10/03 2350) Weight:  [124.7 kg] 124.7 kg (10/03 1629) Physical Exam: General: Sitting in bed, NAD Cardiovascular: RRR without murmur Respiratory: Normal work of breathing on room air.  No focal consolidation or wheezing noted. Abdomen: Soft, nontender, nondistended Extremities: Trace peripheral edema  Laboratory: Most recent CBC Lab Results  Component Value Date   WBC 18.2 (H) 07/21/2024   HGB 13.4 07/21/2024   HCT 41.0 07/21/2024   MCV 100.0 07/21/2024   PLT 236 07/21/2024   Most recent BMP    Latest Ref Rng & Units 07/21/2024    2:18 PM  BMP  Glucose 70 - 99 mg/dL 94   BUN 8 - 23 mg/dL 11   Creatinine 9.38 - 1.24 mg/dL 8.97   Sodium 864 - 854 mmol/L 138   Potassium 3.5 - 5.1 mmol/L 3.9   Chloride 98 - 111 mmol/L 107   CO2 22 - 32 mmol/L 21   Calcium  8.9 - 10.3 mg/dL 9.4     Other pertinent labs: Lactate: 1.2 Quad screen: Negative UA: Small leukocytes, 50+ WBCs Urine strep antigen: Negative Blood cultures: Pending  Imaging/Diagnostic Tests: CT Angio Chest Pulmonary Embolism (PE) W or WO Contrast  Result Date: 07/21/2024 IMPRESSION: 1. No evidence of arterial embolus. Prominent pulmonary trunk and veins. 2. Cardiomegaly with increased left chamber predominance, increased hypertrophy in the left ventricular wall and septum. Lipomatous hypertrophy interatrial septum. 3. Increased interstitial markings in the lung apices which could be due to slight interstitial edema or interstitial pneumonitis. 4. Diffuse  bronchial thickening consistent with bronchitis or reactive airways disease. 5. Interval smaller caliber of central airways consistent with respiration, bronchospasm or bronchomalacia. 6. Aortic and coronary artery atherosclerosis. 7. Hepatic steatosis. Borderline cirrhotic liver appearance. 8. Stable borderline prominent mediastinal and left hilar lymph nodes. 9. Emphysema.  Status post left lower lobectomy. Aortic Atherosclerosis (ICD10-I70.0) and Emphysema (ICD10-J43.9). Electronically Signed   By: Francis Quam M.D.   On: 07/21/2024 22:06   DG Chest Port 1 View Result Date: 07/21/2024 IMPRESSION: No acute cardiopulmonary abnormality.     Theophilus Pagan, MD 07/22/2024, 7:41 AM  PGY-3, [facility] Family Medicine FPTS Intern pager: [phone], text pages welcome Secure chat group [facility] Teaching Service

## 2024-07-22 NOTE — Hospital Course (Signed)
 Sean Gardner is a 68 y.o.male with a history of lung cancer, OSA, CAD with prior MI, HLD, SCC of neck s/p resection who was admitted to the family medicine teaching Service at [facility] for sepsis. His hospital course is detailed below:  Sepsis COPD exacerbation Initially thought to be urinary source, initiated on CTX by the ED.  Upon admission symptoms of increased sputum and shortness of breath, added azithromycin for CAP coverage.  UA mostly unremarkable, follow-up urine culture negative therefore CTX discontinued.  CT PE negative for acute thrombus and focal consolidation, transition to doxycycline for COPD exacerbation coverage.  Completed prednisone x 5-day course received breathing treatments supportive care.  Cardiomegaly CT PE showed some interseptal thickening, echo showed EF 55-60% which was unchanged from previous without any ventricular dysfunction, but again showed the asymmetric septal thickening.  Urinary incontinence Present on admission, but new from patient's baseline.  Initially thought to be related to UTI, but UA showed no bacteria and normal urine.  Patient continued to have what appears to be urge incontinence managed with external catheter during admission.  Bladder scans showed no retention with postvoid residuals.  At time of discharge patient was trialed on Oxybutynin IR 5 mg x 2, plan to transition to 10 mg extended release on discharge.  Explained to family that urinary incontinence would not be fully addressed at time of discharge and he will need to follow-up with urology outpatient to fully treat his symptoms.  Other chronic conditions were medically managed with home medications and formulary alternatives as necessary (HTN, OSA, HLD)  PCP Follow-up Recommendations: COPD maintenance therapy. Outpatient cardiology follow-up Outpatient referral to urology for urinary incontinence

## 2024-07-22 NOTE — Assessment & Plan Note (Addendum)
 Initially meeting SIRS criteria, most likely respiratory component given denies intra-abdominal and urinary symptoms.  CT PE showed no acute PE but concern for bronchitis/small airway disease without focal consolidation.  Reassuringly normal lung exam and on room air this a.m.  Good p.o. intake, will d/c fluids.  Continue with respiratory/urinary antibiotic coverage pending urine culture results. - Continue CTX and azithromycin, narrow pending culture results - Start Prednisone 40mg  daily x 5 days - Urine culture pending - F/u Blood cultures - Fall precautions - Tylenol  650 mg Q6H PRN - AM CBC and BMP - d/c MIVF

## 2024-07-22 NOTE — Assessment & Plan Note (Addendum)
 Noted on CT PE with interseptal thickening.  Asymptomatic. Echo ordered, pending.

## 2024-07-23 ENCOUNTER — Other Ambulatory Visit (HOSPITAL_COMMUNITY): Payer: Self-pay

## 2024-07-23 DIAGNOSIS — J441 Chronic obstructive pulmonary disease with (acute) exacerbation: Secondary | ICD-10-CM | POA: Diagnosis not present

## 2024-07-23 DIAGNOSIS — R32 Unspecified urinary incontinence: Secondary | ICD-10-CM

## 2024-07-23 LAB — LEGIONELLA PNEUMOPHILA SEROGP 1 UR AG: L. pneumophila Serogp 1 Ur Ag: NEGATIVE

## 2024-07-23 LAB — CBC
HCT: 36.8 % — ABNORMAL LOW (ref 39.0–52.0)
Hemoglobin: 12.3 g/dL — ABNORMAL LOW (ref 13.0–17.0)
MCH: 32.5 pg (ref 26.0–34.0)
MCHC: 33.4 g/dL (ref 30.0–36.0)
MCV: 97.4 fL (ref 80.0–100.0)
Platelets: 185 K/uL (ref 150–400)
RBC: 3.78 MIL/uL — ABNORMAL LOW (ref 4.22–5.81)
RDW: 14.5 % (ref 11.5–15.5)
WBC: 8.9 K/uL (ref 4.0–10.5)
nRBC: 0 % (ref 0.0–0.2)

## 2024-07-23 MED ORDER — OXYBUTYNIN CHLORIDE 5 MG PO TABS
5.0000 mg | ORAL_TABLET | Freq: Once | ORAL | Status: AC
  Administered 2024-07-23: 5 mg via ORAL
  Filled 2024-07-23: qty 1

## 2024-07-23 MED ORDER — PHENAZOPYRIDINE HCL 100 MG PO TABS: 100.0000 mg | ORAL_TABLET | Freq: Three times a day (TID) | ORAL | 0 refills | Status: DC

## 2024-07-23 MED ORDER — PREDNISONE 20 MG PO TABS: 40.0000 mg | ORAL_TABLET | Freq: Every day | ORAL | 0 refills | Status: DC

## 2024-07-23 MED ORDER — OXYBUTYNIN CHLORIDE ER 10 MG PO TB24
10.0000 mg | ORAL_TABLET | Freq: Every day | ORAL | 0 refills | Status: AC
  Filled 2024-07-23: qty 30, 30d supply, fill #0

## 2024-07-23 MED ORDER — PHENAZOPYRIDINE HCL 100 MG PO TABS
100.0000 mg | ORAL_TABLET | Freq: Three times a day (TID) | ORAL | 0 refills | Status: AC
  Filled 2024-07-23: qty 10, 4d supply, fill #0

## 2024-07-23 MED ORDER — OXYBUTYNIN CHLORIDE ER 10 MG PO TB24
10.0000 mg | ORAL_TABLET | Freq: Every day | ORAL | 0 refills | Status: DC
  Filled 2024-07-23: qty 30, 30d supply, fill #0

## 2024-07-23 MED ORDER — PREDNISONE 20 MG PO TABS
40.0000 mg | ORAL_TABLET | Freq: Every day | ORAL | 0 refills | Status: AC
  Filled 2024-07-23: qty 6, 3d supply, fill #0

## 2024-07-23 MED ORDER — OXYBUTYNIN CHLORIDE 5 MG PO TABS
5.0000 mg | ORAL_TABLET | Freq: Two times a day (BID) | ORAL | Status: DC
  Administered 2024-07-23 (×2): 5 mg via ORAL
  Filled 2024-07-23: qty 1

## 2024-07-23 MED ORDER — DOXYCYCLINE HYCLATE 100 MG PO TABS
100.0000 mg | ORAL_TABLET | Freq: Two times a day (BID) | ORAL | 0 refills | Status: DC
  Filled 2024-07-23: qty 4, 2d supply, fill #0

## 2024-07-23 MED ORDER — PREDNISONE 20 MG PO TABS
40.0000 mg | ORAL_TABLET | Freq: Every day | ORAL | 0 refills | Status: DC
  Filled 2024-07-23: qty 6, 3d supply, fill #0

## 2024-07-23 MED ORDER — PHENAZOPYRIDINE HCL 100 MG PO TABS
100.0000 mg | ORAL_TABLET | Freq: Three times a day (TID) | ORAL | 0 refills | Status: DC
  Filled 2024-07-23: qty 10, 4d supply, fill #0

## 2024-07-23 MED ORDER — OXYBUTYNIN CHLORIDE ER 10 MG PO TB24: 10.0000 mg | ORAL_TABLET | Freq: Every day | ORAL | 0 refills | Status: DC

## 2024-07-23 MED ORDER — DOXYCYCLINE HYCLATE 100 MG PO TABS
100.0000 mg | ORAL_TABLET | Freq: Two times a day (BID) | ORAL | 0 refills | Status: AC
  Filled 2024-07-23: qty 4, 2d supply, fill #0

## 2024-07-23 MED ORDER — DOXYCYCLINE HYCLATE 100 MG PO TABS: 100.0000 mg | ORAL_TABLET | Freq: Two times a day (BID) | ORAL | 0 refills | Status: DC

## 2024-07-23 NOTE — Assessment & Plan Note (Signed)
 Improved today with antibiotic therapy as well as steroids and breathing treatments.  Will plan for a 5-day total course of antibiotics, completed with doxycycline as well as a 5-day course of prednisone. - S/p CTX and azithromycin, continue doxycycline for 5-day total course - Prednisone 40mg  daily x 5 days - Urine cultures negative - Blood cultures NGTD - Fall precautions - Tylenol  650 mg Q6H PRN - AM CBC and BMP - d/c MIVF

## 2024-07-23 NOTE — Discharge Instructions (Addendum)
 Dear Sean Gardner,   Thank you for letting us  participate in your care! In this section, you will find a brief hospital admission summary of why you were admitted to the hospital, what happened during your admission, your diagnosis/diagnoses, and recommended follow up.  Primary diagnosis: COPD Exacerbation Treatment plan: complete your courses of doxycycline and steroids, continue to use your home inhalers as needed.  Secondary diagnosis: Urinary incontinence Treatment plan: complete your course of pyridium, continue taking the oxybutynin to manage your symptoms. You should follow up with your doctor this week to get a referral to a urologist for further treatment. In the mean time, you may need to use incontinence supplies to go about your   We also noticed some asymmetry in the muscular part of your heart between the two lower chambers. We recommend discussing this with your primary doctor, or with a cardiologist if you see one already.   POST-HOSPITAL & CARE INSTRUCTIONS We recommend following up with your PCP within 1 week from being discharged from the hospital. Please let PCP/Specialists know of any changes in medications that were made which you will be able to see in the medications section of this packet. Please also follow up with a cardiologist to discuss the findings on your echocardiogram.   Please also see urology in the outpatient setting to address your urinary concerns.  DOCTOR'S APPOINTMENTS & FOLLOW UP No future appointments.   Thank you for choosing [facility]! Take care and be well!  Family Medicine Teaching Service Inpatient Team [place] [facility]  [facility]  [address] [place], [place] [address] [phone]

## 2024-07-23 NOTE — Assessment & Plan Note (Signed)
 Echocardiogram confirmed asymmetric septal thickening but did not show any left ventricular dysfunction or outflow obstruction.  Recommend outpatient follow-up with patient's cardiologist

## 2024-07-23 NOTE — Assessment & Plan Note (Signed)
 HTN -restart metoprolol  to tartrate 50 mg twice daily; holding home meds amlodipine  10 mg, hydralazine  50 mg 3 times daily OSA - ordered CPAP nightly HLD - Home Crestor  40 mg and ezetimibe  10 mg daily S/p right mandibular gland excision and radial neck dissection 2 weeks ago - Home bacitracin  ointment twice daily

## 2024-07-23 NOTE — Discharge Summary (Signed)
 Family Medicine Teaching [facility] Discharge Summary  Patient name: Sean Gardner Medical record number: [id number] Date of birth: 09-02-1956 Age: 68 y.o. Gender: male Date of Admission: 07/21/2024  Date of Discharge: 07/23/2024 Admitting Physician: Raguel KANDICE Lee, DO  Primary Care Provider: [facility] Consultants: None  Indication for Hospitalization: SIRS Criteria  Discharge Diagnoses/Problem List:  Principal Problem for Admission: COPD Exacerbation Other Problems addressed during stay:  Principal Problem:   COPD exacerbation (HCC) Active Problems:   Chronic health problem   Cardiomegaly   Urinary incontinence   Brief Hospital Course:  Sean Gardner is a 68 y.o.male with a history of lung cancer, OSA, CAD with prior MI, HLD, SCC of neck s/p resection who was admitted to the family medicine teaching Service at [facility] for sepsis. His hospital course is detailed below:  Sepsis COPD exacerbation Initially thought to be urinary source, initiated on CTX by the ED.  Upon admission symptoms of increased sputum and shortness of breath, added azithromycin for CAP coverage.  UA mostly unremarkable, follow-up urine culture negative therefore CTX discontinued.  CT PE negative for acute thrombus and focal consolidation, transition to doxycycline for COPD exacerbation coverage.  Completed prednisone x 5-day course received breathing treatments supportive care.  Cardiomegaly CT PE showed some interseptal thickening, echo showed EF 55-60% which was unchanged from previous without any ventricular dysfunction, but again showed the asymmetric septal thickening.  Urinary incontinence Present on admission, but new from patient's baseline.  Initially thought to be related to UTI, but UA showed no bacteria and normal urine.  Patient continued to have what appears to be urge incontinence managed with external catheter during admission.  Bladder scans showed no retention with postvoid  residuals.  At time of discharge patient was trialed on Oxybutynin IR 5 mg x 2, plan to transition to 10 mg extended release on discharge.  Explained to family that urinary incontinence would not be fully addressed at time of discharge and he will need to follow-up with urology outpatient to fully treat his symptoms.  Other chronic conditions were medically managed with home medications and formulary alternatives as necessary (HTN, OSA, HLD)  PCP Follow-up Recommendations: COPD maintenance therapy. Outpatient cardiology follow-up Outpatient referral to urology for urinary incontinence    Results/Tests Pending at Time of Discharge:  Unresulted Labs (From admission, onward)     Start     Ordered   07/21/24 2223  Legionella Pneumophila Serogp 1 Ur Ag  Once,   R        07/21/24 2222             Disposition: Home  Discharge Condition: Stable  Discharge Exam:  Vitals:   07/23/24 0744 07/23/24 0807  BP:  (!) 155/64  Pulse:  63  Resp:    Temp:  (!) 97.4 F (36.3 C)  SpO2: 95% 97%   General: A&O, NAD Cardiac: RRR, no m/r/g Respiratory: CTAB, normal WOB, no w/c/r GI: Soft, NTTP, non-distended  Extremities: NTTP, no peripheral edema.    Significant Procedures:   ECHOCARDIOGRAM IMPRESSIONS     1. Left ventricular ejection fraction, by estimation, is 55 to 60%. The  left ventricle has normal function. Left ventricular endocardial border  not optimally defined to evaluate regional wall motion. There is severe  asymmetric left ventricular  hypertrophy of the septal segment. Left ventricular diastolic parameters  are consistent with Grade I diastolic dysfunction (impaired relaxation).  Elevated left ventricular end-diastolic pressure.   2. Right ventricular systolic function  is normal. The right ventricular  size is mildly enlarged. There is normal pulmonary artery systolic  pressure.   3. The mitral valve is grossly normal. Trivial mitral valve  regurgitation. No  evidence of mitral stenosis.   4. The aortic valve was not well visualized. Aortic valve regurgitation  is not visualized. No aortic stenosis is present.   5. The inferior vena cava is dilated in size with <50% respiratory  variability, suggesting right atrial pressure of 15 mmHg.    Significant Labs and Imaging:  Recent Labs  Lab 07/21/24 1418 07/22/24 0740 07/23/24 0444  WBC 18.2* 12.2* 8.9  HGB 13.4 12.1* 12.3*  HCT 41.0 36.3* 36.8*  PLT 236 182 185   Recent Labs  Lab 07/21/24 1418 07/22/24 0740  NA 138 137  K 3.9 3.8  CL 107 107  CO2 21* 23  GLUCOSE 94 99  BUN 11 6*  CREATININE 1.02 0.77  CALCIUM  9.4 9.1  ALKPHOS 46  --   AST 19  --   ALT 22  --   ALBUMIN  3.3*  --    EXAM: CT ANGIOGRAPHY CHEST WITH CONTRAST IMPRESSION: 1. No evidence of arterial embolus. Prominent pulmonary trunk and veins. 2. Cardiomegaly with increased left chamber predominance, increased hypertrophy in the left ventricular wall and septum. Lipomatous hypertrophy interatrial septum. 3. Increased interstitial markings in the lung apices which could be due to slight interstitial edema or interstitial pneumonitis. 4. Diffuse bronchial thickening consistent with bronchitis or reactive airways disease. 5. Interval smaller caliber of central airways consistent with respiration, bronchospasm or bronchomalacia. 6. Aortic and coronary artery atherosclerosis. 7. Hepatic steatosis. Borderline cirrhotic liver appearance. 8. Stable borderline prominent mediastinal and left hilar lymph nodes. 9. Emphysema.  Status post left lower lobectomy.  Discharge Medications:  Allergies as of 07/23/2024       Reactions   Lisinopril  Other (See Comments)   Hyperkalemia   Zolpidem Tartrate Other (See Comments)   Felt fuzzy and hallucinations        Medication List     TAKE these medications    acetaminophen  500 MG tablet Commonly known as: TYLENOL  Take 2 tablets (1,000 mg total) by mouth every 6  (six) hours as needed. What changed: reasons to take this   amLODipine  10 MG tablet Commonly known as: NORVASC  Take 1 tablet (10 mg total) by mouth daily.   bacitracin  500 UNIT/GM ointment Apply 1 Application topically 2 (two) times daily.   Carboxymethylcellulose Sodium 1 % Gel Apply 1 drop to eye at bedtime as needed (dry eyes).   cetirizine 10 MG tablet Commonly known as: ZYRTEC Take 10 mg by mouth daily as needed for allergies.   clopidogrel  75 MG tablet Commonly known as: PLAVIX  Take 1 tablet (75 mg total) by mouth daily.   doxycycline 100 MG tablet Commonly known as: VIBRA-TABS Take 1 tablet (100 mg total) by mouth every 12 (twelve) hours for 2 days.   ezetimibe  10 MG tablet Commonly known as: ZETIA  Take 1 tablet (10 mg total) by mouth daily after supper.   hydrALAZINE  50 MG tablet Commonly known as: APRESOLINE  Take 1 tablet (50 mg total) by mouth 3 (three) times daily.   ibuprofen  400 MG tablet Commonly known as: ADVIL  Take 1 tablet (400 mg total) by mouth every 6 (six) hours as needed for mild pain (pain score 1-3) or moderate pain (pain score 4-6).   metoprolol  tartrate 50 MG tablet Commonly known as: LOPRESSOR  Take 50 mg by mouth 2 (two) times daily.  Hold if systolic blood pressure (top blood pressure number) less than 100 mmHg or heart rate less than 60 bpm (pulse).   NON FORMULARY Pt uses a cpap nightly   oxybutynin 10 MG 24 hr tablet Commonly known as: Ditropan XL Take 1 tablet (10 mg total) by mouth daily.   phenazopyridine 100 MG tablet Commonly known as: PYRIDIUM Take 1 tablet (100 mg total) by mouth 3 (three) times daily with meals.   predniSONE 20 MG tablet Commonly known as: DELTASONE Take 2 tablets (40 mg total) by mouth daily with breakfast for 3 days.   promethazine -dextromethorphan 6.25-15 MG/5ML syrup Commonly known as: PROMETHAZINE -DM Take 5 mLs by mouth 4 (four) times daily as needed for cough.   rosuvastatin  40 MG tablet Commonly  known as: CRESTOR  TAKE 1 TABLET BY MOUTH EVERYDAY AT BEDTIME   tiotropium 18 MCG inhalation capsule Commonly known as: SPIRIVA  Place 18 mcg into inhaler and inhale daily.   vitamin B-12 500 MCG tablet Commonly known as: CYANOCOBALAMIN Take 500 mcg by mouth in the morning and at bedtime.   Vitamin D3 25 MCG (1000 UT) Caps Take 1,000 Units by mouth in the morning and at bedtime.        Discharge Instructions: Please refer to Patient Instructions section of EMR for full details.  Patient was counseled important signs and symptoms that should prompt return to medical care, changes in medications, dietary instructions, activity restrictions, and follow up appointments.   Follow-Up Appointments:  Follow-up Information     [facility] Follow up.   Contact information: [address] [facility] [place] [address] [phone]                 Cleotilde Lukes, DO 07/23/2024, 1:13 PM PGY-2,  Family Medicine

## 2024-07-23 NOTE — Evaluation (Signed)
 Physical Therapy Brief Evaluation and Discharge Note Patient Details Name: Sean Gardner MRN: [id number] DOB: [birth year] Today's Date: 07/23/2024   History of Present Illness  Pt is 68 yo presenting [facility] on [facility] due to fever s/p R mandibular gland excision 2 weeks ago. Pt meeting SIRS criteria upon presentation due to COPD exacerbation. Pmh: squamous cell carcinoma of the neck, lung cancer, HTN, OSA, CAD, MI, HLD, COPD and PVD.  Clinical Impression  Pt is currently presenting at Mod I to Ind for all functional activities including sit to stand and gait without an AD. Family is very supportive. Pt with some tight scar tissue on the R side of cervical spine and currently has urinary incontinence; discussed possibility of receiving physical therapy OP for these issues if he needs to at a later time due to co-morbidities and current health concerns. Currently pt is presenting at baseline level of functioning and no skilled physical therapy services recommended. Pt will be discharged from skilled physical therapy services at this time; please re-consult if further needs arise.           PT Assessment Patient does not need any further PT services     Equipment Recommendations None recommended by PT     Precautions/Restrictions Precautions Precautions: None Recall of Precautions/Restrictions: Intact Restrictions Weight Bearing Restrictions Per Provider Order: No        Mobility  Bed Mobility       General bed mobility comments: Up in chair at beginning and end of session  Transfers Overall transfer level: Independent Equipment used: None    Ambulation/Gait Ambulation/Gait assistance: Independent Gait Distance (Feet): 400 Feet Assistive device: None Gait Pattern/deviations: Step-through pattern, WFL(Within Functional Limits) Gait Speed: Pace WFL General Gait Details: no noted deviations    Balance Overall balance assessment: No apparent balance deficits (not formally  assessed)        Pertinent Vitals/Pain   Pain Assessment Pain Assessment: No/denies pain     Home Living Family/patient expects to be discharged to:: Private residence Living Arrangements: Spouse/significant other Available Help at Discharge: Family;Available 24 hours/day Home Environment: Stairs to enter;No rail  Stairs-Number of Steps: 2 Home Equipment: Agricultural consultant (2 wheels);Shower seat - built in;Cane - single point        Prior Function Level of Independence: Independent      UE/LE Assessment   UE ROM/Strength/Tone/Coordination: WFL    LE ROM/Strength/Tone/Coordination: [facility]      Communication   Communication Communication: No apparent difficulties     Cognition Overall Cognitive Status: Appears within functional limits for tasks assessed/performed       General Comments General comments (skin integrity, edema, etc.): Spouse present and supportive. Scar tissue noted on the R cervical area with adherence to tissue        Assessment/Plan           No Skilled PT Patient is modified independent with all activity/mobility;Patient at baseline level of functioning    AMPAC 6 Clicks Help needed turning from your back to your side while in a flat bed without using bedrails?: None Help needed moving from lying on your back to sitting on the side of a flat bed without using bedrails?: None Help needed moving to and from a bed to a chair (including a wheelchair)?: None Help needed standing up from a chair using your arms (e.g., wheelchair or bedside chair)?: None Help needed to walk in hospital room?: None Help needed climbing 3-5 steps with a railing? : A Little  6 Click Score: 23      End of Session   Activity Tolerance: Patient tolerated treatment well Patient left: in chair;with family/visitor present;with call bell/phone within reach Nurse Communication: Mobility status       Time: 8547-8495 PT Time Calculation (min) (ACUTE ONLY): 12  min  Charges:   PT Evaluation $PT Eval Low Complexity: 1 Low     Sean Gardner, DPT, CLT  Acute Rehabilitation Services Office: [phone] (Secure chat preferred)   Sean Gardner  07/23/2024, 3:12 PM

## 2024-07-23 NOTE — Plan of Care (Signed)

## 2024-07-23 NOTE — Assessment & Plan Note (Signed)
 Initially thought to be due to urinary tract infection, however urinary testing has been negative for source of infection.  Likely urge incontinence, treatment as below. -Begin oxybutynin 5 mg twice daily while inpatient, transition to 10 mg extended release on discharge -Trial of voiding without vacuum catheter prior to discharge -Recommend outpatient follow-up with urology.

## 2024-07-23 NOTE — Progress Notes (Signed)
 Daily Progress Note Intern Pager: [phone]  Patient name: Sean Gardner Medical record number: [id number] Date of birth: Dec 29, 1955 Age: 68 y.o. Gender: male  Primary Care Provider: [facility] Consultants: none Code Status: Full  Pt Overview and Major Events to Date:  10/3: Admitted to FMTS  Medical Decision Making:  This is a 68 year old male patient with a past medical history of squamous cell carcinoma of the neck s/p resection, lung cancer, hypertension, OSA, CAD with prior MI, HLD, COPD and PVD meeting SIRS criteria upon presentation due to likely respiratory source.  He no longer meets SIRS criteria and has been appropriately covered for suspected COPD exacerbation.  Stable for discharge. Assessment & Plan COPD exacerbation (HCC) Improved today with antibiotic therapy as well as steroids and breathing treatments.  Will plan for a 5-day total course of antibiotics, completed with doxycycline as well as a 5-day course of prednisone. - S/p CTX and azithromycin, continue doxycycline for 5-day total course - Prednisone 40mg  daily x 5 days - Urine cultures negative - Blood cultures NGTD - Fall precautions - Tylenol  650 mg Q6H PRN - AM CBC and BMP - d/c MIVF Cardiomegaly Echocardiogram confirmed asymmetric septal thickening but did not show any left ventricular dysfunction or outflow obstruction.  Recommend outpatient follow-up with patient's cardiologist Urinary incontinence Initially thought to be due to urinary tract infection, however urinary testing has been negative for source of infection.  Likely urge incontinence, treatment as below. -Begin oxybutynin 5 mg twice daily while inpatient, transition to 10 mg extended release on discharge -Trial of voiding without vacuum catheter prior to discharge -Recommend outpatient follow-up with urology. Chronic health problem HTN -restart metoprolol  to tartrate 50 mg twice daily; holding home meds amlodipine  10 mg,  hydralazine  50 mg 3 times daily OSA - ordered CPAP nightly HLD - Home Crestor  40 mg and ezetimibe  10 mg daily S/p right mandibular gland excision and radial neck dissection 2 weeks ago - Home bacitracin  ointment twice daily  FEN/GI: Regular PPx: Lovenox  Dispo:Home pending void trial.   Subjective:  Patient feels overall improved from admission but is still concerned with his urinary incontinence.  He is concerned about infection risk and would prefer to go home without symptoms of incontinence.  Discussed with patient and family that while there are treatments for incontinence they take time to take effect and he will likely leave the hospital without this problem fully resolved.  Objective: Temp:  [97.4 F (36.3 C)-98.3 F (36.8 C)] 97.4 F (36.3 C) (10/05 0807) Pulse Rate:  [57-72] 63 (10/05 0807) Resp:  [18] 18 (10/05 0342) BP: (123-155)/(64-88) 155/64 (10/05 0807) SpO2:  [88 %-98 %] 97 % (10/05 0807) Physical Exam: General: Well-appearing elderly gentleman no distress Cardiovascular: RRR, no M/R/G Respiratory: CTAB, no increased work of breathing Abdomen: Flat, soft, nontender Extremities: 2+ pulses in the bilateral lower and upper extremities.  Laboratory: Most recent CBC Lab Results  Component Value Date   WBC 8.9 07/23/2024   HGB 12.3 (L) 07/23/2024   HCT 36.8 (L) 07/23/2024   MCV 97.4 07/23/2024   PLT 185 07/23/2024   Most recent BMP    Latest Ref Rng & Units 07/22/2024    7:40 AM  BMP  Glucose 70 - 99 mg/dL 99   BUN 8 - 23 mg/dL 6   Creatinine 9.38 - 8.75 mg/dL 9.22   Sodium 864 - 854 mmol/L 137   Potassium 3.5 - 5.1 mmol/L 3.8   Chloride 98 - 111 mmol/L  107   CO2 22 - 32 mmol/L 23   Calcium  8.9 - 10.3 mg/dL 9.1     Cleotilde Lukes, DO 07/23/2024, 1:05 PM  PGY-2, [facility] Family Medicine FPTS Intern pager: [phone], text pages welcome Secure chat group [facility] Teaching Service

## 2024-07-24 ENCOUNTER — Other Ambulatory Visit (HOSPITAL_COMMUNITY): Payer: Self-pay

## 2024-07-26 LAB — CULTURE, BLOOD (ROUTINE X 2)
Culture: NO GROWTH
Culture: NO GROWTH
Special Requests: ADEQUATE
Special Requests: ADEQUATE

## 2024-07-27 ENCOUNTER — Ambulatory Visit (INDEPENDENT_AMBULATORY_CARE_PROVIDER_SITE_OTHER): Payer: Self-pay | Admitting: Otolaryngology

## 2024-07-27 ENCOUNTER — Encounter (INDEPENDENT_AMBULATORY_CARE_PROVIDER_SITE_OTHER): Payer: Self-pay | Admitting: Otolaryngology

## 2024-07-27 DIAGNOSIS — C4442 Squamous cell carcinoma of skin of scalp and neck: Secondary | ICD-10-CM

## 2024-07-27 NOTE — Progress Notes (Signed)
 No phone visit done, left VM as unable to reach. Referred to radiation oncology [name]

## 2024-07-28 ENCOUNTER — Telehealth: Payer: Self-pay | Admitting: Oncology

## 2024-07-28 NOTE — Telephone Encounter (Signed)
 Scheduled appointments per referral. Talked with the patient's daughter and she is aware of the appointment time and date as well as the address. Patient was informed to arrive 10-15 minutes prior with updated insurance information. Patient was also informed that two guests are permitted but needs to be at least 70 years of age. All questions were answered.

## 2024-08-01 ENCOUNTER — Encounter (INDEPENDENT_AMBULATORY_CARE_PROVIDER_SITE_OTHER): Payer: Self-pay | Admitting: Otolaryngology

## 2024-08-01 ENCOUNTER — Ambulatory Visit (INDEPENDENT_AMBULATORY_CARE_PROVIDER_SITE_OTHER): Admitting: Otolaryngology

## 2024-08-01 DIAGNOSIS — C4442 Squamous cell carcinoma of skin of scalp and neck: Secondary | ICD-10-CM

## 2024-08-05 NOTE — Progress Notes (Signed)
 ENT Note: --------------------------------------------------------- 08/01/2024 The patient gave consent to have this visit done by telemedicine / virtual visit, two identifiers were used to identify patient. This is also consent for access the chart and treat the patient via this visit. The patient is located in [place] .  I, the provider, am at the office. Joined by telephone.  Mr. Sean Gardner was recently admitted for possible PNA/sepsis. Neck incision without issue, not tender, otherwise he feels like he is doing well. He had several questions regarding radiation and post-op management which I answered. He has not had RT visit but is scheduled to see Dr. Autumn. He is cleared for radiation and I did report that I have filled out his [facility] paperwork. If he has issues seeing radiation or does not hear back from them in 1 week, he is to call our office.  I will see him back in late March/approx 3 months after finishing radiation and any other adjuvant therapy -- as recommended during tumor board.  Taylinn Brabant B Derek Laughter

## 2024-08-07 ENCOUNTER — Telehealth (INDEPENDENT_AMBULATORY_CARE_PROVIDER_SITE_OTHER): Payer: Self-pay | Admitting: Otolaryngology

## 2024-08-07 NOTE — Telephone Encounter (Signed)
 SABRA

## 2024-08-10 ENCOUNTER — Inpatient Hospital Stay

## 2024-08-10 ENCOUNTER — Inpatient Hospital Stay: Attending: Oncology | Admitting: Oncology

## 2024-08-10 VITALS — BP 133/63 | HR 55 | Temp 97.7°F | Resp 14 | Wt 280.7 lb

## 2024-08-10 DIAGNOSIS — Z801 Family history of malignant neoplasm of trachea, bronchus and lung: Secondary | ICD-10-CM | POA: Diagnosis not present

## 2024-08-10 DIAGNOSIS — Z85828 Personal history of other malignant neoplasm of skin: Secondary | ICD-10-CM | POA: Insufficient documentation

## 2024-08-10 DIAGNOSIS — C4402 Squamous cell carcinoma of skin of lip: Secondary | ICD-10-CM | POA: Insufficient documentation

## 2024-08-10 DIAGNOSIS — Z85118 Personal history of other malignant neoplasm of bronchus and lung: Secondary | ICD-10-CM | POA: Insufficient documentation

## 2024-08-10 DIAGNOSIS — Z902 Acquired absence of lung [part of]: Secondary | ICD-10-CM | POA: Insufficient documentation

## 2024-08-10 DIAGNOSIS — Z7902 Long term (current) use of antithrombotics/antiplatelets: Secondary | ICD-10-CM | POA: Diagnosis not present

## 2024-08-10 DIAGNOSIS — Z87891 Personal history of nicotine dependence: Secondary | ICD-10-CM | POA: Insufficient documentation

## 2024-08-10 DIAGNOSIS — Z79899 Other long term (current) drug therapy: Secondary | ICD-10-CM | POA: Insufficient documentation

## 2024-08-10 DIAGNOSIS — I2510 Atherosclerotic heart disease of native coronary artery without angina pectoris: Secondary | ICD-10-CM | POA: Diagnosis not present

## 2024-08-10 DIAGNOSIS — I10 Essential (primary) hypertension: Secondary | ICD-10-CM | POA: Insufficient documentation

## 2024-08-10 DIAGNOSIS — C4492 Squamous cell carcinoma of skin, unspecified: Secondary | ICD-10-CM | POA: Insufficient documentation

## 2024-08-10 DIAGNOSIS — Z85820 Personal history of malignant melanoma of skin: Secondary | ICD-10-CM | POA: Insufficient documentation

## 2024-08-10 DIAGNOSIS — I252 Old myocardial infarction: Secondary | ICD-10-CM | POA: Insufficient documentation

## 2024-08-10 DIAGNOSIS — Z85110 Personal history of malignant carcinoid tumor of bronchus and lung: Secondary | ICD-10-CM | POA: Insufficient documentation

## 2024-08-10 DIAGNOSIS — G4733 Obstructive sleep apnea (adult) (pediatric): Secondary | ICD-10-CM | POA: Diagnosis not present

## 2024-08-10 DIAGNOSIS — Z860101 Personal history of adenomatous and serrated colon polyps: Secondary | ICD-10-CM | POA: Diagnosis not present

## 2024-08-10 DIAGNOSIS — Z8249 Family history of ischemic heart disease and other diseases of the circulatory system: Secondary | ICD-10-CM | POA: Insufficient documentation

## 2024-08-10 NOTE — Progress Notes (Signed)
 Oncology Nurse Navigator Documentation   Met with patient during initial consult with Dr. Autumn.  He was accompanied by his wife and daughter.  I introduced myself as his/their Navigator, explained my role as a member of the Care Team. Assisted with post-consult appt scheduling. He will need a PET and we are working on authorization from the [place] for radiation consult.  They verbalized understanding of information provided. I provided them with my direct contact information and encouraged them to call with questions/concerns moving forward.  Delon Jefferson, RN, BSN, OCN Head & Neck Oncology Nurse Navigator [facility] [phone]

## 2024-08-10 NOTE — Progress Notes (Signed)
 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  Recurrent squamous cell carcinoma of skin Recurrent squamous cell carcinoma primarily affects the head and neck region, with concern for recurrence and deeper invasion due to anatomical location.   He has undergone multiple excisions and cryotherapy.   Reviewed NCCN guidelines and discussed treatment options.  Given recurrent squamous cell carcinoma of the skin, proposed systemic treatments with Cemiplimab (Libtayo).  Discussed side effect profile including autoimmune reactions including hypothyroidism or hyperthyroidism, dermatitis, pneumonitis, colitis, hepatitis etc.  Will arrange for formal chemo education.    Plan is to proceed with cemiplimab treatments, given every 3 weeks intravenously for up to 2 years. He and his family were informed about the treatment plan, benefits, and potential side effects.  We will arrange for formal education session.  Patient is awaiting [facility] authorization of radiation oncology treatments.   Following radiation therapy, we will plan to initiate Cemiplimab.  We will arrange for Port-A-Cath placement accordingly.  We did submit request for whole-body PET scan for staging.  Will follow-up on results, once available.  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  Currently in remission.  Radiation therapy for squamous cell carcinoma Radiation therapy is planned to address residual cancer cells in the lip and neck area, especially given the positive lymph node and uncertain surgical margins. Potential side effects include localized sunburn-like effects and fatigue, with no systemic effects like alopecia or nausea expected. Radiation will precede immunotherapy to target remaining cancer cells. - Coordinate with radiation oncology for treatment planning, pending VA authorization.  ? Hx of Melanoma Melanoma, possibly on the face, lacks current evidence of recurrence. Past treatment involved excision without further intervention. He is advised to confirm with the dermatologist regarding any past melanoma diagnosis. - Confirm past melanoma diagnosis with dermatologist   I reviewed lab results and outside records for this visit and discussed relevant results with the patient. Diagnosis, plan of care and treatment options were also discussed in detail with the patient. Opportunity provided to ask questions and answers provided to his apparent satisfaction. Provided instructions to call our clinic with any problems, questions or concerns prior to return visit. I recommended to continue follow-up with PCP and sub-specialists. He verbalized understanding and agreed with the plan. No barriers to learning was detected.  NCCN guidelines have been consulted in the planning of this patient's care.  Chinita Patten, MD  08/10/2024 1:22 PM  [facility]  ONC - A DEPT  OF [facility] [address] [place] [address] Dept: [phone] Dept Fax: [phone]   HISTORY OF PRESENTING ILLNESS:   I have reviewed his chart and materials related to his cancer extensively and collaborated history with the patient. Summary of oncologic history is as follows:  ONCOLOGY HISTORY:  He has a history of Stage IIA (T1 N1 MX) non-small cell lung cancer, presented with low grade neuroendocrine carcinoma diagnosed in March 2010. Status post left lower lobectomy under the care of Dr. Dusty on 01/29/2009. Status post 4 cycles of adjuvant chemotherapy. First cycle was given with cisplatin and docetaxel, discontinued secondary to intolerance. The patient completed 3 more cycles with carboplatin and docetaxel. Last dose was given 05/27/2009.  He remains on surveillance with no evidence of disease recurrence, last seen by Dr. Sherrod in our clinic in November 2019.  He has a history of multiple skin cancers, including squamous cell carcinoma and melanoma, primarily located on his arms and face. He has undergone several excisions, including a recent surgery on July 07, 2024, and has had lesions frozen during dermatology visits. The lesions are frequently removed but continue to recur, particularly on his face and arms.  He recalls a melanoma diagnosis in the past, possibly on his face, but details are unclear.  He has a significant history of sun exposure due to spending time outdoors [place], and he did not use sunscreen during those times.  He started noticing a right sided neck lump in May 2025.  It was not painful.  He was eventually evaluated at [facility] and had CT of the neck on 03/29/2024: right cystic nodule/mass in close proximity to right submandibular gland (on sagittal there may be a place?), scattered right LAD but no other necrotic nodes noted; right parotid nodule (1cm); no enhancing lesions noted oral  cavity/OP/hypopharynx/larynx.  He was referred to Dr. Eldora Blanch. FNA 04/26/2024: Parotid mass - right: Warthin's tumor. Right submandibular lesion: noted atypical cells, with keratinized debris - possible epidermal inclusion cyst v/s SCCa.  CT Neck 06/12/2024 : right 3x3 cm cystic and solid mass from right SMG. No other adenopathy noted, unable to appreciate any obvious source from proximal airway if carcinoma.   On 07/07/2024, Dr. Eldora Blanch performed right modified radical neck dissection, wedge excision of the left lower lip with primary closure, direct laryngoscopy with biopsy.  Pathology from left lower lip came back positive for invasive squamous cell carcinoma with ulcer, moderately differentiated, keratinizing.  Right neck mass excision also showed invasive squamous cell carcinoma, moderately differentiated, keratinizing.  Carcinoma arises from squamous cell carcinoma in situ in cystic structure.  1 lymph node was positive for carcinoma with direct extension.  Surgical margin of resection was negative for carcinoma.  Right sided submandibular gland was excised and it showed benign salivary gland tissue.  32 lymph nodes from right neck dissection were negative for carcinoma.  Excision of left lower lip showed squamous cell carcinoma in situ with carcinoma extending to mucosal margins.  Right vocal fold biopsy showed polyp, negative for malignancy.  With these findings, patient was referred to medical oncology and radiation oncology for further evaluation and management.  Given recurrent squamous cell skin cancers of head and neck area and elsewhere, proposed systemic treatments with Cemiplimab and patient is agreeable.  Patient is awaiting VA authorization of radiation oncology treatments.  Following radiation therapy, we will plan to initiate Cemiplimab.  INTERVAL HISTORY:  Discussed the use of AI scribe software  for clinical note transcription with the patient, who gave verbal consent to  proceed.  History of Present Illness Sean Gardner is a 68 year old male with a history of skin cancer who presents for evaluation of recurrent skin cancers. He is accompanied by his wife, Esiah Bazinet, and his daughter. He was referred by Dr. Tobie for evaluation of his recurrent skin cancers.  He has a history of multiple skin cancers, including squamous cell carcinoma and melanoma, primarily located on his arms and face. He has undergone several excisions, including a recent surgery on July 07, 2024, and has had lesions frozen during dermatology visits. The lesions are frequently removed but continue to recur, particularly on his face and arms.  He recalls a melanoma diagnosis in the past, possibly on his face, but details are unclear.  He has a significant history of sun exposure due to spending time outdoors [place] and [place], and he did not use sunscreen during those times.  He reports a rash across his chest since his recent surgery, which does not itch. He has tried Benadryl  cream and cortisone without relief. He also experiences altered taste and difficulty swallowing following his recent surgery.  He has a history of lung cancer, for which he underwent a partial lung resection in 2010, followed by five chemotherapy sessions post-surgery.  He has a history of open-heart surgery and hip replacement, with the latter resulting in infections requiring hospitalization. He is prone to infections and typically requires antibiotics prior to surgeries.  No pain in the back despite having bumps and a rash. Altered taste and difficulty swallowing post-surgery.     MEDICAL HISTORY:  Past Medical History:  Diagnosis Date   Adenomatous colon polyp    CAD (coronary artery disease)    Claudication in peripheral vascular disease    COPD (chronic obstructive pulmonary disease) (HCC)    GERD (gastroesophageal reflux disease)    Hyperlipidemia    Hypertension    Lung cancer (HCC)     neuroendocrine lung ca dx 2010   Myocardial infarction [facility]) 07/2000   Peripheral vascular disease    Shortness of breath    on exertion,can't lay on his back   Skin cancer    arms/face   Sleep apnea    uses CPAP nightly    SURGICAL HISTORY: Past Surgical History:  Procedure Laterality Date   ACHILLES TENDON SURGERY Left    ANTERIOR HIP REVISION Right 11/13/2020   Procedure: IRRIGATION AND DEBRIDEMENT RIGHT HIP;  Surgeon: Jerri Kay HERO, MD;  Location: [facility] OR;  Service: Orthopedics;  Laterality: Right;   COLONOSCOPY  10/02/2022   CORONARY ARTERY BYPASS GRAFT  08/18/2000   LIMA--dLIMA, RIMA-dRCA, left RA-OM2, SVG-D2   FLEXIBLE SIGMOIDOSCOPY N/A 01/18/2013   Procedure: FLEXIBLE SIGMOIDOSCOPY;  Surgeon: Elsie Cree, MD;  Location: [facility];  Service: Endoscopy;  Laterality: N/A;   HOT HEMOSTASIS N/A 01/18/2013   Procedure: HOT HEMOSTASIS (ARGON PLASMA COAGULATION/BICAP);  Surgeon: Elsie Cree, MD;  Location: [facility];  Service: Endoscopy;  Laterality: N/A;   INCISION AND DRAINAGE HIP Right 12/05/2020   Procedure: IRRIGATION AND DEBRIDEMENT RIGHT HIP;  Surgeon: Jerri Kay HERO, MD;  Location: [facility] OR;  Service: Orthopedics;  Laterality: Right;   INGUINAL HERNIA REPAIR Bilateral    inguinal   LEFT HEART CATH AND CORS/GRAFTS ANGIOGRAPHY N/A 07/21/2022   Procedure: LEFT HEART CATH AND CORS/GRAFTS ANGIOGRAPHY;  Surgeon: Ladona Heinz, MD;  Location: [facility] INVASIVE CV LAB;  Service: Cardiovascular;  Laterality: N/A;   LUNG  SURGERY Left 01/29/2009   left lower lobectomy    LYMPH NODE BIOPSY Right 07/07/2024   Procedure: LYMPH NODE BIOPSY;  Surgeon: [name], MD;  Location: [facility] OR;  Service: ENT;  Laterality: Right;  Direct Laryngoscopy with possible biopsy, Right submandibular gland resection, right neck cervical lymph node biopsy, possible right neck dissection   RADICAL NECK DISSECTION Right 07/07/2024   Procedure: RIGHT NECK RADICAL DISSECTION;  Surgeon: [name], MD;   Location: [facility] OR;  Service: ENT;  Laterality: Right;   SKIN BIOPSY Left 07/07/2024   Procedure: LEFT LOWER LIP BIOPSY;  Surgeon: [name], MD;  Location: [facility] OR;  Service: ENT;  Laterality: Left;   SUBMANDIBULAR GLAND EXCISION Right 07/07/2024   Procedure: RIGHT SUBMANDIBULAR GLAND EXCISION;  Surgeon: [name], MD;  Location: [facility] OR;  Service: ENT;  Laterality: Right;   TOTAL HIP ARTHROPLASTY Right 09/16/2020   Procedure: RIGHT TOTAL HIP ARTHROPLASTY ANTERIOR APPROACH;  Surgeon: Jerri Kay HERO, MD;  Location: [facility] OR;  Service: Orthopedics;  Laterality: Right;   UPPER GI ENDOSCOPY  10/02/2022    SOCIAL HISTORY: He reports that he quit smoking about 15 years ago. His smoking use included cigarettes. He started smoking about 35 years ago. He has a 40 pack-year smoking history. He has never used smokeless tobacco. He reports that he does not currently use alcohol. He reports that he does not use drugs. Social History   Socioeconomic History   Marital status: Married    Spouse name: Not on file   Number of children: 2   Years of education: Not on file   Highest education level: Not on file  Occupational History   Occupation: retired  Tobacco Use   Smoking status: Former    Current packs/day: 0.00    Average packs/day: 2.0 packs/day for 20.0 years (40.0 ttl pk-yrs)    Types: Cigarettes    Start date: 10/19/1988    Quit date: 10/19/2008    Years since quitting: 15.8   Smokeless tobacco: Never  Vaping Use   Vaping status: Never Used  Substance and Sexual Activity   Alcohol use: Not Currently   Drug use: No   Sexual activity: Not Currently  Other Topics Concern   Not on file  Social History Narrative   Not on file   Social Drivers of Health   Financial Resource Strain: Not on file  Food Insecurity: No Food Insecurity (08/10/2024)   Hunger Vital Sign    Worried About Running Out of Food in the Last Year: Never true    Ran Out of Food in the Last Year: Never true   Transportation Needs: No Transportation Needs (08/10/2024)   PRAPARE - Administrator, Civil Service (Medical): No    Lack of Transportation (Non-Medical): No  Physical Activity: Not on file  Stress: Not on file  Social Connections: Moderately Isolated (07/22/2024)   Social Connection and Isolation Panel    Frequency of Communication with Friends and Family: More than three times a week    Frequency of Social Gatherings with Friends and Family: Never    Attends Religious Services: Never    Database Administrator or Organizations: No    Attends Banker Meetings: Never    Marital Status: Married  Catering Manager Violence: Not At Risk (08/10/2024)   Humiliation, Afraid, Rape, and Kick questionnaire    Fear of Current or Ex-Partner: No    Emotionally Abused: No    Physically Abused:  No    Sexually Abused: No    FAMILY HISTORY: Family History  Problem Relation Age of Onset   Hypertension Mother    Dementia Mother    Lung cancer Father        Agent orange   Heart disease Brother    Heart disease Brother    Colon cancer Neg Hx    Esophageal cancer Neg Hx    Rectal cancer Neg Hx    Stomach cancer Neg Hx     ALLERGIES:  He is allergic to lisinopril  and zolpidem tartrate.  MEDICATIONS:  Current Outpatient Medications  Medication Sig Dispense Refill   amLODipine  (NORVASC ) 10 MG tablet Take 1 tablet (10 mg total) by mouth daily. 90 tablet 1   Carboxymethylcellulose Sodium 1 % GEL Apply 1 drop to eye at bedtime as needed (dry eyes).     cetirizine (ZYRTEC) 10 MG tablet Take 10 mg by mouth daily as needed for allergies.     Cholecalciferol (VITAMIN D3) 25 MCG (1000 UT) CAPS Take 1,000 Units by mouth in the morning and at bedtime.     clopidogrel  (PLAVIX ) 75 MG tablet Take 1 tablet (75 mg total) by mouth daily. 90 tablet 3   ezetimibe  (ZETIA ) 10 MG tablet Take 1 tablet (10 mg total) by mouth daily after supper. 90 tablet 3   hydrALAZINE  (APRESOLINE ) 50 MG  tablet Take 1 tablet (50 mg total) by mouth 3 (three) times daily. 270 tablet 3   ibuprofen  (ADVIL ) 400 MG tablet Take 1 tablet (400 mg total) by mouth every 6 (six) hours as needed for mild pain (pain score 1-3) or moderate pain (pain score 4-6). 30 tablet 0   metoprolol  (LOPRESSOR ) 50 MG tablet Take 50 mg by mouth 2 (two) times daily. Hold if systolic blood pressure (top blood pressure number) less than 100 mmHg or heart rate less than 60 bpm (pulse).     NON FORMULARY Pt uses a cpap nightly     rosuvastatin  (CRESTOR ) 40 MG tablet TAKE 1 TABLET BY MOUTH EVERYDAY AT BEDTIME 90 tablet 1   tiotropium (SPIRIVA ) 18 MCG inhalation capsule Place 18 mcg into inhaler and inhale daily.     vitamin B-12 (CYANOCOBALAMIN) 500 MCG tablet Take 500 mcg by mouth in the morning and at bedtime.     bacitracin  500 UNIT/GM ointment Apply 1 Application topically 2 (two) times daily. (Patient not taking: Reported on 08/10/2024) 15 g 0   oxybutynin (DITROPAN XL) 10 MG 24 hr tablet Take 1 tablet (10 mg total) by mouth daily. (Patient not taking: Reported on 08/10/2024) 30 tablet 0   phenazopyridine (PYRIDIUM) 100 MG tablet Take 1 tablet (100 mg total) by mouth 3 (three) times daily with meals. (Patient not taking: Reported on 08/10/2024) 10 tablet 0   No current facility-administered medications for this visit.    REVIEW OF SYSTEMS:    Review of Systems - Oncology  All other pertinent systems were reviewed with the patient and are negative.  PHYSICAL EXAMINATION:    Onc Performance Status - 08/10/24 1056       ECOG Perf Status   ECOG Perf Status Restricted in physically strenuous activity but ambulatory and able to carry out work of a light or sedentary nature, e.g., light house work, office work      KPS SCALE   KPS % SCORE Normal activity with effort, some s/s of disease          Vitals:   08/10/24 1048  BP: 133/63  Pulse: (!) 55  Resp: 14  Temp: 97.7 F (36.5 C)  SpO2: 96%   Filed Weights    08/10/24 1048  Weight: 280 lb 11.2 oz (127.3 kg)    Physical Exam Constitutional:      General: He is not in acute distress.    Appearance: Normal appearance.  HENT:     Head: Normocephalic and atraumatic.  Eyes:     Conjunctiva/sclera: Conjunctivae normal.  Neck:     Comments: Scars from recent right-sided neck surgery are healing well.  No palpable lymphadenopathy.  Mild lymphedema. Cardiovascular:     Rate and Rhythm: Normal rate and regular rhythm.  Pulmonary:     Effort: Pulmonary effort is normal. No respiratory distress.  Abdominal:     General: There is no distension.  Skin:    Comments: Several scattered actinic keratoses and scars from previously treated squamous cell carcinomas on torso, face, extremities bilaterally  Neurological:     General: No focal deficit present.     Mental Status: He is alert and oriented to person, place, and time.  Psychiatric:        Mood and Affect: Mood normal.        Behavior: Behavior normal.      LABORATORY DATA:   I have reviewed the data as listed.   Lab Results  Component Value Date   WBC 8.9 07/23/2024   HGB 12.3 (L) 07/23/2024   HCT 36.8 (L) 07/23/2024   MCV 97.4 07/23/2024   PLT 185 07/23/2024   Recent Labs    09/06/23 1405 07/04/24 1135 07/21/24 1418 07/22/24 0740  NA 142 139 138 137  K 5.0 4.4 3.9 3.8  CL 108* 106 107 107  CO2 19* 27 21* 23  GLUCOSE 85 72 94 99  BUN 16 14 11  6*  CREATININE 1.09 0.90 1.02 0.77  CALCIUM  10.3* 10.2 9.4 9.1  GFRNONAA  --  >60 >60 >60  PROT 6.6  --  6.2*  --   ALBUMIN  4.2  --  3.3*  --   AST 26  --  19  --   ALT 28  --  22  --   ALKPHOS 54  --  46  --   BILITOT 0.6  --  1.0  --      RADIOGRAPHIC STUDIES:  I have personally reviewed the radiological images as listed and agree with the findings in the report.  ECHOCARDIOGRAM COMPLETE Result Date: 07/22/2024    ECHOCARDIOGRAM REPORT   Patient Name:   Sean Gardner Date of Exam: 07/22/2024 Medical Rec #:  [id number]       Height:       72.0 in Accession #:    [id number]     Weight:       275.0 lb Date of Birth:  Feb 24, 1956       BSA:          2.439 m Patient Age:    68 years       BP:           142/64 mmHg Patient Gender: M              HR:           67 bpm. Exam Location:  Inpatient Procedure: 2D Echo (Both Spectral and Color Flow Doppler were utilized during            procedure). Indications:    Heart block. Right bundle branch block.  History:  Patient has prior history of Echocardiogram examinations, most                 recent 08/13/2010. Cardiomegaly, Prior CABG, COPD,                 Signs/Symptoms:Shortness of Breath; Risk Factors:Dyslipidemia.  Sonographer:    Tinnie Barefoot RDCS Referring Phys: [id number] SARA L NEAL  Sonographer Comments: Image acquisition challenging due to patient body habitus. IMPRESSIONS  1. Left ventricular ejection fraction, by estimation, is 55 to 60%. The left ventricle has normal function. Left ventricular endocardial border not optimally defined to evaluate regional wall motion. There is severe asymmetric left ventricular hypertrophy of the septal segment. Left ventricular diastolic parameters are consistent with Grade I diastolic dysfunction (impaired relaxation). Elevated left ventricular end-diastolic pressure.  2. Right ventricular systolic function is normal. The right ventricular size is mildly enlarged. There is normal pulmonary artery systolic pressure.  3. The mitral valve is grossly normal. Trivial mitral valve regurgitation. No evidence of mitral stenosis.  4. The aortic valve was not well visualized. Aortic valve regurgitation is not visualized. No aortic stenosis is present.  5. The inferior vena cava is dilated in size with <50% respiratory variability, suggesting right atrial pressure of 15 mmHg. Comparison(s): No prior Echocardiogram. FINDINGS  Left Ventricle: Left ventricular ejection fraction, by estimation, is 55 to 60%. The left ventricle has normal function. Left  ventricular endocardial border not optimally defined to evaluate regional wall motion. Strain was performed and the global longitudinal strain is indeterminate. The left ventricular internal cavity size was normal in size. There is severe asymmetric left ventricular hypertrophy of the septal segment. Left ventricular diastolic parameters are consistent with Grade I diastolic  dysfunction (impaired relaxation). Elevated left ventricular end-diastolic pressure. Right Ventricle: The right ventricular size is mildly enlarged. No increase in right ventricular wall thickness. Right ventricular systolic function is normal. There is normal pulmonary artery systolic pressure. The tricuspid regurgitant velocity is 2.27  m/s, and with an assumed right atrial pressure of 15 mmHg, the estimated right ventricular systolic pressure is 35.6 mmHg. Left Atrium: Left atrial size was normal in size. Right Atrium: Right atrial size was normal in size. Pericardium: There is no evidence of pericardial effusion. Mitral Valve: The mitral valve is grossly normal. Trivial mitral valve regurgitation. No evidence of mitral valve stenosis. Tricuspid Valve: The tricuspid valve is grossly normal. Tricuspid valve regurgitation is trivial. No evidence of tricuspid stenosis. Aortic Valve: The aortic valve was not well visualized. Aortic valve regurgitation is not visualized. No aortic stenosis is present. Pulmonic Valve: The pulmonic valve was not well visualized. Pulmonic valve regurgitation is not visualized. No evidence of pulmonic stenosis. Aorta: The aortic root is normal in size and structure. Venous: The inferior vena cava is dilated in size with less than 50% respiratory variability, suggesting right atrial pressure of 15 mmHg. IAS/Shunts: No atrial level shunt detected by color flow Doppler. Additional Comments: 3D was performed not requiring image post processing on an independent workstation and was indeterminate.  LEFT VENTRICLE PLAX 2D  LVIDd:         5.60 cm   Diastology LVIDs:         3.70 cm   LV e' medial:    5.77 cm/s LV PW:         1.30 cm   LV E/e' medial:  17.9 LV IVS:        1.80 cm   LV e' lateral:   6.42 cm/s  LVOT diam:     2.30 cm   LV E/e' lateral: 16.0 LV SV:         103 LV SV Index:   42 LVOT Area:     4.15 cm  RIGHT VENTRICLE             IVC RV Basal diam:  3.50 cm     IVC diam: 2.60 cm RV S prime:     10.70 cm/s TAPSE (M-mode): 1.4 cm LEFT ATRIUM             Index        RIGHT ATRIUM           Index LA diam:        5.20 cm 2.13 cm/m   RA Area:     19.70 cm LA Vol (A2C):   60.5 ml 24.80 ml/m  RA Volume:   55.70 ml  22.83 ml/m LA Vol (A4C):   66.5 ml 27.26 ml/m LA Biplane Vol: 66.2 ml 27.14 ml/m  AORTIC VALVE LVOT Vmax:   142.00 cm/s LVOT Vmean:  89.000 cm/s LVOT VTI:    0.247 m  AORTA Ao Root diam: 3.40 cm MITRAL VALVE                TRICUSPID VALVE MV Area (PHT): 3.34 cm     TR Peak grad:   20.6 mmHg MV Decel Time: 227 msec     TR Vmax:        227.00 cm/s MV E velocity: 103.00 cm/s MV A velocity: 89.70 cm/s   SHUNTS MV E/A ratio:  1.15         Systemic VTI:  0.25 m                             Systemic Diam: 2.30 cm Vishnu Priya Mallipeddi Electronically signed by Diannah Late Mallipeddi Signature Date/Time: 07/22/2024/5:13:47 PM    Final    CT Angio Chest Pulmonary Embolism (PE) W or WO Contrast Result Date: 07/21/2024 CLINICAL DATA:  Non-small-cell lung cancer survivor status post left lower lobectomy and chemotherapy 2010, presents with chronic cough and supplemental oxygen requirement. Concern for acute or chronic PE. EXAM: CT ANGIOGRAPHY CHEST WITH CONTRAST TECHNIQUE: Multidetector CT imaging of the chest was performed using the standard protocol during bolus administration of intravenous contrast. Multiplanar CT image reconstructions and MIPs were obtained to evaluate the vascular anatomy. RADIATION DOSE REDUCTION: This exam was performed according to the departmental dose-optimization program which includes  automated exposure control, adjustment of the mA and/or kV according to patient size and/or use of iterative reconstruction technique. CONTRAST:  80mL OMNIPAQUE  IOHEXOL  350 MG/ML SOLN COMPARISON:  Portable chest today, PA and lateral chest 09/05/2020, PET-CT 09/22/2018, and chest CT with contrast 08/04/2018. FINDINGS: Cardiovascular: The heart is moderately enlarged with left chamber predominance, increased hypertrophy in the left ventricular wall and septum. There is lipomatous hypertrophy of the inter-atrial septum. Changes of remote CABG with patchy three-vessel native CAD. Pulmonary arteries are well opacified and clear with prominent pulmonary trunk measuring 3.3 cm, previously 2.5 cm. The pulmonary veins are slightly prominent, increased. There are scattered atherosclerosis in the great vessels without stenosis, tortuosity and scattered calcific plaques of the aorta without aneurysm, stenosis or dissection. Mediastinum/Nodes: There is mediastinal lipomatosis. Hiatal fat hernia. Stable slight prominence of a left anterior hilar lymph node measuring 1.2 cm on 7:58. There are stable borderline right paratracheal, subcarinal and azygoesophageal recess nodes.  Negative thyroid  gland, axillary spaces and esophagus, mild retained secretions in the thoracic trachea. Lungs/Pleura: Interval smaller caliber of central airways consistent with respiration, bronchospasm or bronchomalacia. Diffuse bronchial thickening. Surgical change remote left lower lobectomy. There are mild paraseptal emphysematous changes in both lung apices, linear scar-like opacities right upper lobe, left lower lung field. There are increased interstitial markings in both lung apices which could be due to slight interstitial edema or interstitial pneumonitis. There is no consolidation, effusion or pneumothorax, no visible nodule. Upper Abdomen: Hepatic steatosis. No acute upper abdominal findings. Abdominal aortic atherosclerosis. Borderline cirrhotic  liver appearance. Musculoskeletal: Extensive thoracic spine bridging enthesopathy. No acute or significant osseous findings. The visualized ribcage is intact. Visualized chest wall without mass. Review of the MIP images confirms the above findings. IMPRESSION: 1. No evidence of arterial embolus. Prominent pulmonary trunk and veins. 2. Cardiomegaly with increased left chamber predominance, increased hypertrophy in the left ventricular wall and septum. Lipomatous hypertrophy interatrial septum. 3. Increased interstitial markings in the lung apices which could be due to slight interstitial edema or interstitial pneumonitis. 4. Diffuse bronchial thickening consistent with bronchitis or reactive airways disease. 5. Interval smaller caliber of central airways consistent with respiration, bronchospasm or bronchomalacia. 6. Aortic and coronary artery atherosclerosis. 7. Hepatic steatosis. Borderline cirrhotic liver appearance. 8. Stable borderline prominent mediastinal and left hilar lymph nodes. 9. Emphysema.  Status post left lower lobectomy. Aortic Atherosclerosis (ICD10-I70.0) and Emphysema (ICD10-J43.9). Electronically Signed   By: Francis Quam M.D.   On: 07/21/2024 22:06   DG Chest Port 1 View Result Date: 07/21/2024 CLINICAL DATA:  Questionable sepsis - evaluate for abnormality EXAM: PORTABLE CHEST - 1 VIEW COMPARISON:  None available. FINDINGS: No focal airspace consolidation, pleural effusion, or pneumothorax. Mild cardiomegaly. Sternotomy wires and CABG markers. Tortuous aorta with aortic atherosclerosis. No acute fracture or destructive lesions. Multilevel thoracic osteophytosis. IMPRESSION: No acute cardiopulmonary abnormality. Electronically Signed   By: Rogelia Myers M.D.   On: 07/21/2024 16:48    Orders Placed This Encounter  Procedures   NM PET Image Initial (PI) Whole Body    Patient is a veteran    Standing Status:   Future    Expected Date:   08/14/2024    Expiration Date:   08/10/2025     If indicated for the ordered procedure, I authorize the administration of a radiopharmaceutical per Radiology protocol:   Yes    Preferred imaging location?:   [facility]    CODE STATUS:  Code Status History     Date Active Date Inactive Code Status Order ID Comments User Context   07/21/2024 2041 07/23/2024 2047 Full Code [id number]  Lennie Raguel MATSU, DO ED   07/07/2024 1949 07/08/2024 1654 Full Code [id number]  [name], MD Inpatient   07/21/2022 1114 07/21/2022 1903 Full Code [id number]  Ladona Heinz, MD Inpatient   12/05/2020 1827 12/06/2020 2126 Full Code [id number]  Jerri Kay HERO, MD Inpatient   11/13/2020 1945 11/18/2020 1804 Full Code [id number]  Jerri Kay HERO, MD Inpatient   09/16/2020 1150 09/17/2020 2242 Full Code [id number]  Jerri Kay HERO, MD Inpatient   05/11/2016 2033 05/15/2016 1752 Full Code [id number]  Celinda Alm Lot, MD Inpatient    Questions for Most Recent Historical Code Status (Order [id number])     Question Answer   By: Consent: discussion documented in EHR            Future Appointments  Date Time Provider Department Center  08/18/2024 12:00 PM [facility] PET CT  1 [facility] [place]     I spent a total of 70 minutes during this encounter with the patient including review of chart and various tests results, discussions about plan of care and coordination of care plan.  This document was completed utilizing speech recognition software. Grammatical errors, random word insertions, pronoun errors, and incomplete sentences are an occasional consequence of this system due to software limitations, ambient noise, and hardware issues. Any formal questions or concerns about the content, text or information contained within the body of this dictation should be directly addressed to the provider for clarification.

## 2024-08-11 ENCOUNTER — Telehealth (INDEPENDENT_AMBULATORY_CARE_PROVIDER_SITE_OTHER): Payer: Self-pay | Admitting: Otolaryngology

## 2024-08-11 ENCOUNTER — Other Ambulatory Visit

## 2024-08-11 ENCOUNTER — Ambulatory Visit: Admitting: Oncology

## 2024-08-11 NOTE — Telephone Encounter (Signed)
 Patient stopped by office on 08/10/2024 with a letter he had received from the [employer] stating there was a problem with billing for a claim for DOS 07/07/2024.  Dr. Tobie did a biopsy in the [facility] OR on that day.  I called [facility]illing on 08/11/2024 and spoke with Joen.  The physician listed on the letter is Cordella Fix who is the anesthesiologist for the that day.  Joen stated that there does not appear to be a problem or balance due on the Physician side for the claim.  The hospital billing side appears to have had a billing issue that is currently being worked on.  A corrected claim was process on 08/04/2024 and Medical Records were sent on 08/07/2024.  I called the patient and spoke with his daughter, Maeola.  She is now handling all of his doctors appointments.  I relayed this information and gave her the [facility] Billing Dept phone # in case there are any questions.  Date of Letter: 07/26/2024  Claim Details: Provider: Cordella Cowing Patient Control Number:  [id number] Claim Dates:  07/07/2024 - 07/07/2024 Billed Amount:  $[id number]  Date of Service: 07/07/2024 Service Code: 99679 Service Description:  Anesth Neck Organ 1 yr/> Rejection Code: [id number]  Rejection Reason:  the claim has a billing issue and the provider has been notified.

## 2024-08-14 ENCOUNTER — Encounter: Payer: Self-pay | Admitting: Oncology

## 2024-08-14 DIAGNOSIS — Z85118 Personal history of other malignant neoplasm of bronchus and lung: Secondary | ICD-10-CM | POA: Insufficient documentation

## 2024-08-14 NOTE — Assessment & Plan Note (Signed)
 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  Currently in remission.

## 2024-08-14 NOTE — Assessment & Plan Note (Addendum)
 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   He has undergone multiple excisions and cryotherapy.   Reviewed NCCN guidelines and discussed treatment options.  Given recurrent squamous cell carcinoma of the skin, proposed systemic treatments with Cemiplimab (Libtayo).  Discussed side effect profile including autoimmune reactions including hypothyroidism or hyperthyroidism, dermatitis, pneumonitis, colitis, hepatitis etc.  Will arrange for formal chemo education.    Plan is to proceed with cemiplimab treatments, given every 3 weeks intravenously for up to 2 years. He and his family were informed about the treatment plan, benefits, and potential side effects.  We will arrange for formal education session.  Patient is awaiting [facility] authorization of radiation oncology treatments.  Following radiation therapy, we will plan to initiate Cemiplimab.  We will arrange for Port-A-Cath placement accordingly.  We did submit request for whole-body PET scan for staging.  Will follow-up on results, once available.

## 2024-08-17 ENCOUNTER — Telehealth (INDEPENDENT_AMBULATORY_CARE_PROVIDER_SITE_OTHER): Payer: Self-pay | Admitting: Otolaryngology

## 2024-08-17 NOTE — Telephone Encounter (Signed)
 08/17/24 Patient spouse called with concerns of red spots that patient has from the location of surgery. When the patient squeezes the spots, there is clear liquid that comes out. Please give the patient a call back at [phone]

## 2024-08-18 ENCOUNTER — Ambulatory Visit (HOSPITAL_COMMUNITY)
Admission: RE | Admit: 2024-08-18 | Discharge: 2024-08-18 | Disposition: A | Discharge status: Home / Self Care | Source: Ambulatory Visit | Attending: Oncology | Admitting: Oncology

## 2024-08-18 DIAGNOSIS — C4492 Squamous cell carcinoma of skin, unspecified: Secondary | ICD-10-CM | POA: Diagnosis present

## 2024-08-18 LAB — GLUCOSE, CAPILLARY: Glucose-Capillary: 96 mg/dL (ref 70–99)

## 2024-08-18 MED ORDER — FLUDEOXYGLUCOSE F - 18 (FDG) INJECTION
14.0000 | Freq: Once | INTRAVENOUS | Status: AC
  Administered 2024-08-18: 13.4 via INTRAVENOUS

## 2024-08-24 NOTE — Progress Notes (Signed)
 Head and Neck Cancer Location of Tumor / Histology:  Squamous Cell Carcinoma of Neck  Patient presented  months ago with symptoms of:   Patient felt there was a lump in his throat  Biopsies revealed:    Nutrition Status Yes No Comments  Weight changes? [x]  []  Lost 15 pounds  Swallowing concerns? []  [x]    PEG? []  [x]     Referrals Yes No Comments  Social Work? [x]  []    Dentistry? [x]  []    Swallowing therapy? [x]  []    Nutrition? [x]  []    Med/Onc? [x]  []     Safety Issues Yes No Comments  Prior radiation? []  [x]    Pacemaker/ICD? []  [x]    Possible current pregnancy? []  [x]    Is the patient on methotrexate? []  [x]     Tobacco/Marijuana/Snuff/ETOH use: None  Past/Anticipated interventions by otolaryngology, if any:  07/07/2024 Tobie, MD   Past/Anticipated interventions by medical oncology, if any:      Current Complaints / other details:  Taste changes and dry mouth

## 2024-08-29 NOTE — Progress Notes (Addendum)
 Radiation Oncology         [phone] ________________________________  Initial outpatient Consultation  Name: Sean Gardner MRN: [id number]  Date: 08/30/2024  DOB: 1955-11-23  RR:Ropwpr, Bonni Refugia Tobie Eldora KATHEE, MD   REFERRING PHYSICIAN: Tobie Eldora KATHEE, MD  DIAGNOSIS: C77.0   ICD-10-CM   1. Squamous cell carcinoma, scalp/neck  C44.42     2. Secondary malignant neoplasm of cervical lymph node (HCC)  C77.0 LORazepam  (ATIVAN ) 1 MG tablet         CHIEF COMPLAINT: Here to discuss management of neck cancer  HISTORY OF PRESENT ILLNESS::Sean Gardner is a 68 y.o. 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   He presented in May of 2025 with complains of a neck mass. He was then evaluated at [facility] and had CT of the neck on 03/29/2024 showing a right cystic nodule/mass in close proximity to right submandibular gland and a right parotid nodule measuring 1 cm.  TO MY EYE, this submandibular mass was not visible on a past PET CT performed in Dec 2019  He was then referred to Dr. 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 Right submandibular lesion pathology showed atypical cells, with keratinized debris - possible epidermal inclusion cyst v/s SCCa.     A neck CT performed on 06/12/24 showed a right 3x3 cm cystic and solid mass in submandibular region. No other adenopathy noted, unable to appreciate any obvious source from proximal airway if carcinoma.   See serial images:    Subsequently, patient underwent a right modified radical neck dissection with wedge excision of the left lower lip with primary closure, direct laryngoscopy with biopsy.  on 07/07/24 under the care of Dr. Tobie. Pathology from left lower lip came back positive for  invasive squamous cell carcinoma with ulcer, moderately differentiated, keratinizing. Right neck mass excision also showed invasive squamous cell carcinoma, moderately differentiated, keratinizing. Carcinoma arises from squamous cell carcinoma in situ in cystic structure. 1 lymph node was positive for carcinoma with direct extension. Surgical margin of resection was negative for carcinoma. Right sided submandibular gland was excised and it showed benign salivary gland tissue. 32 lymph nodes from right neck dissection were negative for carcinoma. Excision of left lower lip showed squamous cell carcinoma in situ with carcinoma extending to mucosal margins. Right vocal fold biopsy showed polyp, negative for malignancy.    Subsequently, the patient saw Dr. SHAUNNA on 08/10/24 who recommends undergoing radiation therapy to the lip and neck to address residual cancer cells followed by cemiplimab treatments, given every 3 weeks intravenously for up to 2 years .  Most recent PET scan performed on 10/31 indicated hypermetabolic thickening in the lateral right neck favoring cellulitis and a small right parotid primary neoplasm (note previous bx showed Warthins tumor). Scan did not indicate any convincing evidence of squamous cell carcinoma skin cancer recurrence.    More details from discussion today:    He has multiple squamous cell carcinomas on his arms, legs, chest, head, and face.   No history of melanoma. He is not immunosuppressed to his knowledge and has not had radiation therapy before, though he has undergone chemotherapy.  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 He does not smoke.  He experiences chapped lips, worsened by sun exposure, and uses blood thinners, which exacerbate bleeding when scabs are removed.  Weight Changes: He does acknowledge about 15 pounds weight loss over the past several months.  He also  knowledges taste changes since surgery which may get less appealing to eat Wt Readings from Last 3 Encounters:  08/30/24 275 lb 8 oz (125 kg)  08/10/24 280 lb 11.2 oz (127.3 kg)  07/21/24 275 lb (124.7 kg)    Other symptoms: bumps and a rash in back of neck   Tobacco history, if any: 40 pack-year smoking history for 35 years, report quitting 15 years ago.  ETOH abuse, if any: none   Prior cancers, if any: left lung cancer, recurrent squamous cell carcinoma of skin  PATHOLOGY 07-07-24: FINAL MICROSCOPIC DIAGNOSIS:  A. LIP, LEFT LOWER, BIOPSY:      Invasive squamous cell carcinoma with ulcer, moderately differentiated, keratinizing.      Squamous cell carcinoma in situ identified.  B. SOFT TISSUE MASS, RIGHT NECK, EXCISION:      Invasive squamous cell carcinoma, moderately differentiated, keratinizing.      Carcinoma arises from squamous cell carcinoma in situ in cystic structure.      One lymph node, positive for carcinoma with direct extension (1/1).      Surgical margin of resection is negative for carcinoma.  C. SUBMANDIBULAR GLAND, RIGHT, EXCISION:      Benign salivary gland tissue.      Benign lymphoid tissue.      Negative for malignancy.  D. RIGHT NECK DISSECTION, LEVELS 1A, 1B, 2A, 3 AND 4:      Thirty-two lymph nodes, negative for carcinoma (0/32).  E. LIP, LEFT LOWER, EXCISION:      Squamous cell carcinoma in situ.      Carcinoma extends to mucosal margins.  F. VOCAL FOLD, RIGHT, BIOPSY:      Vocal fold polyp.      Negative for malignancy.  COMMENT  The specimen from the left lower lip (A) demonstrates invasive squamous cell carcinoma arising from squamous cell carcinoma in situ (A and E). The presence of carcinoma in situ in both samples confirms this lesion as a primary tumor. In the soft tissue mass specimen (B), invasive squamous cell carcinoma also arises within carcinoma in situ inside a cystic structure lined by squamous epithelium, likely  representing a branchial cleft cyst undergoing malignant transformation. This lesion is similarly considered a primary tumor due to the carcinoma in situ component. Additionally, squamous cell carcinoma involves an adjacent lymph node by direct extension. However, metastasis between the lip lesion and the right neck mass cannot be entirely excluded, although it is less likely.  A1. LIP, LEFT LOWER, BIOPSY, FROZEN SECTION:  Ulcer with atypia, suspicious for carcinoma. Rapid intraoperative consult diagnosis called to Dr. Tobie by Dr. Belvie @ 1313 07/07/2024.  B1. SOFT TISSUE MASS, RIGHT NECK, FROZEN SECTION:  Squamous cell carcinoma.   PREVIOUS RADIATION THERAPY:  no  PAST MEDICAL HISTORY:  has a past medical history of Adenomatous colon polyp, CAD (coronary artery disease), Claudication in peripheral vascular disease, COPD (chronic obstructive pulmonary disease) (HCC), GERD (gastroesophageal reflux disease), Hyperlipidemia, Hypertension, Lung cancer (HCC), Myocardial infarction (HCC) (07/2000), Peripheral vascular disease, Shortness of breath, Skin cancer, and Sleep apnea.    PAST SURGICAL HISTORY: Past Surgical History:  Procedure Laterality Date   ACHILLES TENDON SURGERY Left    ANTERIOR HIP REVISION Right 11/13/2020   Procedure: IRRIGATION AND DEBRIDEMENT RIGHT HIP;  Surgeon: Jerri Moody  M, MD;  Location: [facility] OR;  Service: Orthopedics;  Laterality: Right;   COLONOSCOPY  10/02/2022   CORONARY ARTERY BYPASS GRAFT  08/18/2000   LIMA--dLIMA, RIMA-dRCA, left RA-OM2, SVG-D2   FLEXIBLE SIGMOIDOSCOPY N/A 01/18/2013   Procedure: FLEXIBLE SIGMOIDOSCOPY;  Surgeon: Elsie Cree, MD;  Location: [facility];  Service: Endoscopy;  Laterality: N/A;   HOT HEMOSTASIS N/A 01/18/2013   Procedure: HOT HEMOSTASIS (ARGON PLASMA COAGULATION/BICAP);  Surgeon: Elsie Cree, MD;  Location: [facility];  Service: Endoscopy;  Laterality: N/A;   INCISION AND DRAINAGE HIP Right 12/05/2020   Procedure:  IRRIGATION AND DEBRIDEMENT RIGHT HIP;  Surgeon: Jerri Kay HERO, MD;  Location: [facility] OR;  Service: Orthopedics;  Laterality: Right;   INGUINAL HERNIA REPAIR Bilateral    inguinal   LEFT HEART CATH AND CORS/GRAFTS ANGIOGRAPHY N/A 07/21/2022   Procedure: LEFT HEART CATH AND CORS/GRAFTS ANGIOGRAPHY;  Surgeon: Ladona Heinz, MD;  Location: [facility];  Service: Cardiovascular;  Laterality: N/A;   LUNG SURGERY Left 01/29/2009   left lower lobectomy    LYMPH NODE BIOPSY Right 07/07/2024   Procedure: LYMPH NODE BIOPSY;  Surgeon: [name], MD;  Location[facility];  Service: ENT;  Laterality: Right;  Direct Laryngoscopy with possible biopsy, Right submandibular gland resection, right neck cervical lymph node biopsy, possible right neck dissection   RADICAL NECK DISSECTION Right 07/07/2024   Procedure: RIGHT NECK RADICAL DISSECTION;  Surgeon: [name], MD;  Location: [facility] OR;  Service: ENT;  Laterality: Right;   SKIN BIOPSY Left 07/07/2024   Procedure: LEFT LOWER LIP BIOPSY;  Surgeon: [name], MD;  Location: [facility] OR;  Service: ENT;  Laterality: Left;   SUBMANDIBULAR GLAND EXCISION Right 07/07/2024   Procedure: RIGHT SUBMANDIBULAR GLAND EXCISION;  Surgeon: [name], MD;  Location: [facility] OR;  Service: ENT;  Laterality: Right;   TOTAL HIP ARTHROPLASTY Right 09/16/2020   Procedure: RIGHT TOTAL HIP ARTHROPLASTY ANTERIOR APPROACH;  Surgeon: Jerri Kay HERO, MD;  Location: [facility] OR;  Service: Orthopedics;  Laterality: Right;   UPPER GI ENDOSCOPY  10/02/2022    FAMILY HISTORY: family history includes Dementia in his mother; Heart disease in his brother and brother; Hypertension in his mother; Lung cancer in his father.  SOCIAL HISTORY:  reports that he quit smoking about 15 years ago. His smoking use included cigarettes. He started smoking about 35 years ago. He has a 40 pack-year smoking history. He has never used smokeless tobacco. He reports that he does not currently use alcohol . He reports that  he does not use drugs.  ALLERGIES: Lisinopril  and Zolpidem tartrate  MEDICATIONS:  Current Outpatient Medications  Medication Sig Dispense Refill   amLODipine  (NORVASC ) 10 MG tablet Take 1 tablet (10 mg total) by mouth daily. 90 tablet 1   Cholecalciferol (VITAMIN D3) 25 MCG (1000 UT) CAPS Take 1,000 Units by mouth in the morning and at bedtime.     clopidogrel  (PLAVIX ) 75 MG tablet Take 1 tablet (75 mg total) by mouth daily. 90 tablet 3   ezetimibe  (ZETIA ) 10 MG tablet Take 1 tablet (10 mg total) by mouth daily after supper. 90 tablet 3   hydrALAZINE  (APRESOLINE ) 50 MG tablet Take 1 tablet (50 mg total) by mouth 3 (three) times daily. 270 tablet 3   LORazepam  (ATIVAN ) 1 MG tablet Take 1 tablet by mouth 30 minute prior to radiation procedures prn claustrophobia 7 tablet 0   metoprolol  (LOPRESSOR ) 50 MG tablet Take 50 mg by mouth 2 (two) times daily. Hold  if systolic blood pressure (top blood pressure number) less than 100 mmHg or heart rate less than 60 bpm (pulse).     rosuvastatin  (CRESTOR ) 40 MG tablet TAKE 1 TABLET BY MOUTH EVERYDAY AT BEDTIME 90 tablet 1   tiotropium (SPIRIVA ) 18 MCG inhalation capsule Place 18 mcg into inhaler and inhale daily.     vitamin B-12 (CYANOCOBALAMIN) 500 MCG tablet Take 500 mcg by mouth in the morning and at bedtime.     bacitracin  500 UNIT/GM ointment Apply 1 Application topically 2 (two) times daily. (Patient not taking: Reported on 08/30/2024) 15 g 0   Carboxymethylcellulose Sodium 1 % GEL Apply 1 drop to eye at bedtime as needed (dry eyes). (Patient not taking: Reported on 08/30/2024)     cetirizine (ZYRTEC) 10 MG tablet Take 10 mg by mouth daily as needed for allergies. (Patient not taking: Reported on 08/30/2024)     ibuprofen  (ADVIL ) 400 MG tablet Take 1 tablet (400 mg total) by mouth every 6 (six) hours as needed for mild pain (pain score 1-3) or moderate pain (pain score 4-6). 30 tablet 0   NON FORMULARY Pt uses a cpap nightly     oxybutynin  (DITROPAN   XL) 10 MG 24 hr tablet Take 1 tablet (10 mg total) by mouth daily. (Patient not taking: Reported on 08/30/2024) 30 tablet 0   phenazopyridine  (PYRIDIUM ) 100 MG tablet Take 1 tablet (100 mg total) by mouth 3 (three) times daily with meals. (Patient not taking: Reported on 08/30/2024) 10 tablet 0   No current facility-administered medications for this encounter.    REVIEW OF SYSTEMS:  Notable for that above.   PHYSICAL EXAM:  height is 6' (1.829 m) and weight is 275 lb 8 oz (125 kg). His temporal temperature is 97.5 F (36.4 C) (abnormal). His blood pressure is 156/75 (abnormal) and his pulse is 58 (abnormal). His respiration is 18 and oxygen saturation is 98%.   General: Alert and oriented, in no acute distress Physical Exam HEENT: Lower lip healed well from surgery. Scar crosses midline over lower lip. Nodularity c/w surgical changes under mucosa of inner lower lip. No signs of recurrence in lower lip. No lesions in oral cavity or upper throat. Oral mucosa moist. Tongue midline. Most teeth intact, some broken/ missing. NECK: Incision on right cervical neck healed well. No palpable masses in neck. CARDIOVASCULAR: Heart regular rhythm. ABDOMEN: Abdomen soft, non-tender, non-distended. EXTREMITIES: No edema in ankles. SKIN: Skin shows redness, dryness, scaling from chronic sun exposure.  Musculoskeletal: symmetric strength and muscle tone throughout. Neurologic: Cranial nerves II through XII are grossly intact. No obvious focalities. Speech is fluent. Coordination is intact. Psychiatric: Judgment and insight are intact. Affect is appropriate.   ECOG = 1  0 - Asymptomatic (Fully active, able to carry on all predisease activities without restriction)  1 - Symptomatic but completely ambulatory (Restricted in physically strenuous activity but ambulatory and able to carry out work of a light or sedentary nature. For example, light housework, office work)  2 - Symptomatic, <50% in bed during the  day (Ambulatory and capable of all self care but unable to carry out any work activities. Up and about more than 50% of waking hours)  3 - Symptomatic, >50% in bed, but not bedbound (Capable of only limited self-care, confined to bed or chair 50% or more of waking hours)  4 - Bedbound (Completely disabled. Cannot carry on any self-care. Totally confined to bed or chair)  5 - Death   Raylene MCGREGOR, Leanne  RH, Tormey DC, et al. [phone]). Toxicity and response criteria of the [facility]. Am. DOROTHA Bridges. Oncol. 5 (6): 649-55   LABORATORY DATA:  Lab Results  Component Value Date   WBC 8.9 07/23/2024   HGB 12.3 (L) 07/23/2024   HCT 36.8 (L) 07/23/2024   MCV 97.4 07/23/2024   PLT 185 07/23/2024   CMP     Component Value Date/Time   NA 137 07/22/2024 0740   NA 142 09/06/2023 1405   NA 140 10/27/2013 0947   K 3.8 07/22/2024 0740   K 4.4 10/27/2013 0947   CL 107 07/22/2024 0740   CL 110 (H) 09/27/2012 1157   CO2 23 07/22/2024 0740   CO2 23 10/27/2013 0947   GLUCOSE 99 07/22/2024 0740   GLUCOSE 93 10/27/2013 0947   GLUCOSE 90 09/27/2012 1157   BUN 6 (L) 07/22/2024 0740   BUN 16 09/06/2023 1405   BUN 13.4 10/27/2013 0947   CREATININE 0.77 07/22/2024 0740   CREATININE 1.01 09/05/2018 1148   CREATININE 0.8 10/27/2013 0947   CALCIUM  9.1 07/22/2024 0740   CALCIUM  9.6 10/27/2013 0947   PROT 6.2 (L) 07/21/2024 1418   PROT 6.6 09/06/2023 1405   PROT 6.8 10/27/2013 0947   ALBUMIN  3.3 (L) 07/21/2024 1418   ALBUMIN  4.2 09/06/2023 1405   ALBUMIN  3.7 10/27/2013 0947   AST 19 07/21/2024 1418   AST 48 (H) 09/05/2018 1148   AST 51 (H) 10/27/2013 0947   ALT 22 07/21/2024 1418   ALT 60 (H) 09/05/2018 1148   ALT 94 (H) 10/27/2013 0947   ALKPHOS 46 07/21/2024 1418   ALKPHOS 57 10/27/2013 0947   BILITOT 1.0 07/21/2024 1418   BILITOT 0.6 09/06/2023 1405   BILITOT 0.7 09/05/2018 1148   BILITOT 0.69 10/27/2013 0947   GFRNONAA >60 07/22/2024 0740   GFRNONAA >60 09/05/2018  1148   GFRAA 95 01/09/2020 1155   GFRAA >60 09/05/2018 1148      No results found for: TSH   RADIOGRAPHY: NM PET Image Initial (PI) Whole Body Result Date: 08/22/2024 EXAM: PET AND CT SKULL BASE TO MID THIGH 08/18/2024 01:40:56 PM TECHNIQUE: RADIOPHARMACEUTICAL: 14 mCi F-18 FDG Uptake time 60 minutes. Glucose level 96 mg/dl. PET imaging was acquired from the base of the skull to the mid thighs. Non-contrast enhanced computed tomography was obtained for attenuation correction and anatomic localization. COMPARISON: None available. CLINICAL HISTORY: Patient with recurrent squamous cell skin cancer. Needs staging. FINDINGS: HEAD AND NECK: No metabolically active cervical lymphadenopathy. Small round hepatic lesion is present within the right parotid gland, measuring 9 mm on image 49. This small rounds nodule is favored to be primary parotid neoplasm. Within the lateral right neck there is a focus of activity along a skin fold with mild soft tissue thickening measuring 7 mm on image 56. This activity is moderate with SUV max equal to 6.7. Lesion is indeterminate but favors a focus of cellulitis rather than carcinoma. Recommend clinical correlation. No cutaneous lesions are identified on whole body scan. CHEST: No metabolically active pulmonary nodules. No metabolically active lymphadenopathy. Post CABG anatomy. ABDOMEN AND PELVIS: No metabolically active lymphadenopathy. Physiologic activity within the gastrointestinal and genitourinary systems. Atherosclerotic calcification of the aorta. BONES AND SOFT TISSUE: No abnormal FDG activity localizes to the bones. Right hip prosthetic. IMPRESSION: 1. No convincing evidence of squamous cell carcinoma skin cancer recurrence. 2. Small right parotid primary neoplasm. 3. Hypermetabolic thickening in the lateral right neck favoring cellulitis. Recommend clinical evaluation. Electronically signed by:  Norleen Boxer MD 08/22/2024 09:54 AM EST RP Workstation: [id number]       IMPRESSION/PLAN: In summary this is a very pleasant 68 year old gentleman with a history of multiple skin cancers.  He presented with a submandibular lymph node in his neck which demonstrates squamous cell carcinoma.  This carcinoma invaded adjacent tissue.  It was the only lymph node that was positive when he underwent right neck dissection.  He would benefit from 6 wks of adjuvant radiation therapy to the highest risk lymph nodes in the right neck/submental region, with particular focus on the submandibular lymph node bed, to prevent further recurrences.  He is already seeing medical oncology to discuss sequential immunotherapy.  The patient has been discussed in detail at our tumor board.  He does have a distant smoking history -it is not certain that this is metastatic skin cancer -but that is our best assessment based on his history.  I think it is less likely that this was a branchial cleft cyst.  The mass was not visible 6 years ago and imaging.   Metastatic squamous cell carcinoma to right submandibular lymph node, post neck dissection, planned radiation. Post neck dissection with lymph node capsule rupture. Radiation planned to sterilize microscopic residual disease. Primary cancer origin uncertain, but likely skin. Emphasized minimizing treatment area to reduce side effects. - Scheduled radiation planning session. - Treat right submandibular area and adjacent lymph nodes with radiation. - Avoid parotid gland to prevent xerostomia - we talked about the pros and cons of this.  He would like to try to preserve his salivary function, and while it is possible that he has microscopic disease in his parotid gland, I think the risks of treating the whole parotid gland outweigh the benefits.  We also discussed that the Warthin's tumor is not a clinical concern - Coordinate with Dr. Tobie for treatment plan --I will verify if his lower lip needs to be treated and if so how much tissue needs to be covered -  Prescribed Ativan  for claustrophobia during treatment planning and first week of radiation.  Squamous cell carcinoma of lower lip, post-excision, planned radiation Post-excision radiation planned to ensure no residual disease. Extent determined by photographs and consultation with Dr. Tobie. Wedge to limit radiation penetration to tongue and teeth. - Obtain photographs from Dr. Tobie to determine radiation extent. - Treat lower lip with radiation using a wedge to limit penetration. - Schedule radiation planning session. - Of note it is possible that the lower lip is considered cured by surgery.  I will clarify this with Dr. Tobie as the margin status is not clear to me when I look at his pathology report  Squamous cell carcinoma of skin, multiple sites Multiple squamous cell carcinomas on arms, legs, chest, and face. No melanoma.   - Continue dermatological care   - Eventual immunotherapy. - Coordinate with dermatologist for any new lesions, especially in the radiation field - do not excise or freeze during radiation therapy.  Unintentional weight loss 15-pound loss likely due to taste changes and decreased appetite from previous treatments. Nutritionist referral planned for weight maintenance during radiation. - Referred to nutritionist for dietary guidance and weight maintenance strategies. - Encouraged high-calorie intake, including peanut butter and protein shakes.  Risk of lymphedema and neck -Referral to physical therapy  Risk of dry mouth - Although I will move the radiation away from the salivary tissue he may have a dry mouth in the future to some extent.  We talked about the  opportunity to be referred to a special dentist before he starts radiation planning.  However this may delay his treatment.  He has already had some delay in seeing me for consultation due to insurance issues.  I recommend that we proceed with treatment planning at the next available appointment and that he see  his dentist for regular cleanings after he finishes radiation therapy.  He is pleased with this plan  We discussed the potential risks, benefits, and side effects of radiotherapy. We talked in detail about acute and late effects. We discussed that some of the most bothersome acute effects may be mucositis, dysgeusia, salivary changes, skin irritation, hair loss, dehydration, weight loss and fatigue. We talked about late effects which include but are not necessarily limited to dysphagia, hypothyroidism, nerve injury, vascular injury, spinal cord injury, xerostomia, trismus, neck edema, dental issues, non-healing wound, and potentially fatal injury to any of the tissues in the head and neck region. No guarantees of treatment were given. A consent form was signed and placed in the patient's medical record. The patient is enthusiastic about proceeding with treatment. I look forward to participating in the patient's care.    Simulation (treatment planning) will take place in the near future    On date of service, in total, I spent 80 minutes on this encounter. Patient was seen in person.  ADDENDUM: After tumor board discussion with otolaryngology and pathology, the consensus was that the patient's lip was probably cured by surgical resection.  There is also minimal concern for residual disease beyond the surgical bed where his mass was removed in the upper neck.  I will focus on the patient's right upper neck for his radiation fields.  __________________________________________   Lauraine Golden, MD  This document serves as a record of services personally performed by Lauraine Golden, MD. It was created on her behalf by Reymundo Cartwright, a trained medical scribe. The creation of this record is based on the scribe's personal observations and the provider's statements to them. This document has been checked and approved by the attending provider.

## 2024-08-30 ENCOUNTER — Ambulatory Visit
Admission: RE | Admit: 2024-08-30 | Discharge: 2024-08-30 | Disposition: A | Discharge status: Home / Self Care | Source: Ambulatory Visit | Attending: Radiation Oncology | Admitting: Radiation Oncology

## 2024-08-30 VITALS — BP 156/75 | HR 58 | Temp 97.5°F | Resp 18 | Ht 72.0 in | Wt 275.5 lb

## 2024-08-30 DIAGNOSIS — Z860101 Personal history of adenomatous and serrated colon polyps: Secondary | ICD-10-CM | POA: Insufficient documentation

## 2024-08-30 DIAGNOSIS — Z87891 Personal history of nicotine dependence: Secondary | ICD-10-CM | POA: Insufficient documentation

## 2024-08-30 DIAGNOSIS — I739 Peripheral vascular disease, unspecified: Secondary | ICD-10-CM | POA: Diagnosis not present

## 2024-08-30 DIAGNOSIS — Z801 Family history of malignant neoplasm of trachea, bronchus and lung: Secondary | ICD-10-CM | POA: Insufficient documentation

## 2024-08-30 DIAGNOSIS — I10 Essential (primary) hypertension: Secondary | ICD-10-CM | POA: Diagnosis not present

## 2024-08-30 DIAGNOSIS — Z85118 Personal history of other malignant neoplasm of bronchus and lung: Secondary | ICD-10-CM | POA: Insufficient documentation

## 2024-08-30 DIAGNOSIS — Z7902 Long term (current) use of antithrombotics/antiplatelets: Secondary | ICD-10-CM | POA: Insufficient documentation

## 2024-08-30 DIAGNOSIS — I252 Old myocardial infarction: Secondary | ICD-10-CM | POA: Diagnosis not present

## 2024-08-30 DIAGNOSIS — C770 Secondary and unspecified malignant neoplasm of lymph nodes of head, face and neck: Secondary | ICD-10-CM | POA: Diagnosis not present

## 2024-08-30 DIAGNOSIS — R21 Rash and other nonspecific skin eruption: Secondary | ICD-10-CM | POA: Insufficient documentation

## 2024-08-30 DIAGNOSIS — I2510 Atherosclerotic heart disease of native coronary artery without angina pectoris: Secondary | ICD-10-CM | POA: Insufficient documentation

## 2024-08-30 DIAGNOSIS — Z85828 Personal history of other malignant neoplasm of skin: Secondary | ICD-10-CM | POA: Diagnosis not present

## 2024-08-30 DIAGNOSIS — J449 Chronic obstructive pulmonary disease, unspecified: Secondary | ICD-10-CM | POA: Insufficient documentation

## 2024-08-30 DIAGNOSIS — C001 Malignant neoplasm of external lower lip: Secondary | ICD-10-CM | POA: Insufficient documentation

## 2024-08-30 DIAGNOSIS — Z79899 Other long term (current) drug therapy: Secondary | ICD-10-CM | POA: Insufficient documentation

## 2024-08-30 DIAGNOSIS — G4730 Sleep apnea, unspecified: Secondary | ICD-10-CM | POA: Diagnosis not present

## 2024-08-30 DIAGNOSIS — E785 Hyperlipidemia, unspecified: Secondary | ICD-10-CM | POA: Diagnosis not present

## 2024-08-30 DIAGNOSIS — Z9221 Personal history of antineoplastic chemotherapy: Secondary | ICD-10-CM | POA: Insufficient documentation

## 2024-08-30 DIAGNOSIS — R634 Abnormal weight loss: Secondary | ICD-10-CM | POA: Diagnosis not present

## 2024-08-30 DIAGNOSIS — C4442 Squamous cell carcinoma of skin of scalp and neck: Secondary | ICD-10-CM

## 2024-08-30 MED ORDER — LORAZEPAM 1 MG PO TABS: ORAL_TABLET | ORAL | 0 refills | Status: AC

## 2024-08-30 NOTE — Progress Notes (Signed)
 Oncology Nurse Navigator Documentation   Met with patient during initial consult with Dr. Izell.  He was accompanied by his wife and daughter.  Further introduced myself as his/their Navigator, explained my role as a member of the Care Team. Assisted with post-consult appt scheduling. A message was sent to CT simulation to get him set up for radiation planning.  They verbalized understanding of information provided. I encouraged them to call with questions/concerns moving forward.  Sean Jefferson, RN, BSN, OCN Head & Neck Oncology Nurse Navigator [facility] [phone]

## 2024-09-06 LAB — SURGICAL PATHOLOGY

## 2024-09-08 ENCOUNTER — Ambulatory Visit
Admission: RE | Admit: 2024-09-08 | Discharge: 2024-09-08 | Disposition: A | Discharge status: Home / Self Care | Source: Ambulatory Visit | Attending: Radiation Oncology | Admitting: Radiation Oncology

## 2024-09-08 DIAGNOSIS — Z510 Encounter for antineoplastic radiation therapy: Secondary | ICD-10-CM | POA: Insufficient documentation

## 2024-09-08 DIAGNOSIS — C770 Secondary and unspecified malignant neoplasm of lymph nodes of head, face and neck: Secondary | ICD-10-CM | POA: Diagnosis present

## 2024-09-08 DIAGNOSIS — C001 Malignant neoplasm of external lower lip: Secondary | ICD-10-CM | POA: Diagnosis present

## 2024-09-08 NOTE — Progress Notes (Signed)
 Oncology Nurse Navigator Documentation  Sean Gardner presented for his CT simulation/radiation planning today. He tolerated procedure without difficulty, denied questions/concerns.   He knows to call me prior to his 09/20/24 [employer].   Delon Jefferson RN, BSN, OCN Head & Neck Oncology Nurse Navigator [facility] at [facility] Phone # [phone]  Fax # [phone]

## 2024-09-13 ENCOUNTER — Other Ambulatory Visit: Payer: Self-pay

## 2024-09-13 ENCOUNTER — Ambulatory Visit: Admitting: Radiation Oncology

## 2024-09-13 DIAGNOSIS — C4492 Squamous cell carcinoma of skin, unspecified: Secondary | ICD-10-CM

## 2024-09-13 DIAGNOSIS — C770 Secondary and unspecified malignant neoplasm of lymph nodes of head, face and neck: Secondary | ICD-10-CM

## 2024-09-18 ENCOUNTER — Ambulatory Visit
Admission: RE | Admit: 2024-09-18 | Discharge: 2024-09-18 | Disposition: A | Discharge status: Home / Self Care | Source: Ambulatory Visit | Attending: Radiation Oncology | Admitting: Radiation Oncology

## 2024-09-18 ENCOUNTER — Telehealth: Payer: Self-pay

## 2024-09-18 DIAGNOSIS — C770 Secondary and unspecified malignant neoplasm of lymph nodes of head, face and neck: Secondary | ICD-10-CM | POA: Insufficient documentation

## 2024-09-18 NOTE — Telephone Encounter (Signed)
 [facility] Clinical Social Work  Clinical Social Work was referred by statistician for assessment of psychosocial needs.  Clinical Social Worker attempted to contact patient by phone to offer support and assess for needs.  CSW briefly spoke with patient's daughter who provided secondary number for POC. CSW attempted to reach patient. CSW was unable to reach patient, left vm with direct contact.      Lizbeth Sprague, LCSW  Clinical Social Worker [facility]

## 2024-09-20 ENCOUNTER — Inpatient Hospital Stay: Attending: Oncology

## 2024-09-20 ENCOUNTER — Other Ambulatory Visit: Payer: Self-pay

## 2024-09-20 DIAGNOSIS — C770 Secondary and unspecified malignant neoplasm of lymph nodes of head, face and neck: Secondary | ICD-10-CM | POA: Diagnosis not present

## 2024-09-20 LAB — RAD ONC ARIA SESSION SUMMARY
Course Elapsed Days: 0
Plan Fractions Treated to Date: 1
Plan Prescribed Dose Per Fraction: 2 Gy
Plan Total Fractions Prescribed: 30
Plan Total Prescribed Dose: 60 Gy
Reference Point Dosage Given to Date: 2 Gy
Reference Point Session Dosage Given: 2 Gy
Session Number: 1

## 2024-09-20 NOTE — Progress Notes (Signed)
 [facility] Clinical Social Work  Clinical Social Work was referred by statistician for assessment of psychosocial needs.  Clinical Social Worker briefly spoke with patient this morning  to offer support and assess for needs.  Patient was preparing to leave for [facility] appointment. CSW introduced herself as a member of his support team and assessed for any needs (none). Patient understands CSW will touch base in 3-4 weeks for follow up via telephone.   Lizbeth Sprague, LCSW  Clinical Social Worker [facility]

## 2024-09-21 ENCOUNTER — Other Ambulatory Visit: Payer: Self-pay

## 2024-09-21 ENCOUNTER — Ambulatory Visit: Admitting: Physical Therapy

## 2024-09-21 ENCOUNTER — Ambulatory Visit
Admission: RE | Admit: 2024-09-21 | Discharge: 2024-09-21 | Disposition: A | Source: Ambulatory Visit | Attending: Radiation Oncology

## 2024-09-21 DIAGNOSIS — C770 Secondary and unspecified malignant neoplasm of lymph nodes of head, face and neck: Secondary | ICD-10-CM | POA: Diagnosis not present

## 2024-09-21 LAB — RAD ONC ARIA SESSION SUMMARY
Course Elapsed Days: 1
Plan Fractions Treated to Date: 2
Plan Prescribed Dose Per Fraction: 2 Gy
Plan Total Fractions Prescribed: 30
Plan Total Prescribed Dose: 60 Gy
Reference Point Dosage Given to Date: 4 Gy
Reference Point Session Dosage Given: 2 Gy
Session Number: 2

## 2024-09-22 ENCOUNTER — Inpatient Hospital Stay: Admitting: Dietician

## 2024-09-22 ENCOUNTER — Telehealth: Payer: Self-pay | Admitting: Dietician

## 2024-09-22 ENCOUNTER — Ambulatory Visit
Admission: RE | Admit: 2024-09-22 | Discharge: 2024-09-22 | Disposition: A | Source: Ambulatory Visit | Attending: Radiation Oncology

## 2024-09-22 ENCOUNTER — Other Ambulatory Visit: Payer: Self-pay

## 2024-09-22 DIAGNOSIS — C770 Secondary and unspecified malignant neoplasm of lymph nodes of head, face and neck: Secondary | ICD-10-CM | POA: Diagnosis not present

## 2024-09-22 LAB — RAD ONC ARIA SESSION SUMMARY
Course Elapsed Days: 2
Plan Fractions Treated to Date: 3
Plan Prescribed Dose Per Fraction: 2 Gy
Plan Total Fractions Prescribed: 30
Plan Total Prescribed Dose: 60 Gy
Reference Point Dosage Given to Date: 6 Gy
Reference Point Session Dosage Given: 2 Gy
Session Number: 3

## 2024-09-22 NOTE — Telephone Encounter (Signed)
 Patient scheduled for nutrition assessment per [facility] referral. RD called preferred number and reached daughter of patient. Daughter provided contact information for patient. Patient did not answer. Left VM with request for return call. Contact information provided.

## 2024-09-25 ENCOUNTER — Other Ambulatory Visit: Payer: Self-pay

## 2024-09-25 ENCOUNTER — Ambulatory Visit: Admission: RE | Admit: 2024-09-25 | Discharge: 2024-09-25 | Attending: Radiation Oncology

## 2024-09-25 ENCOUNTER — Ambulatory Visit
Admission: RE | Admit: 2024-09-25 | Discharge: 2024-09-25 | Disposition: A | Source: Ambulatory Visit | Attending: Radiation Oncology

## 2024-09-25 DIAGNOSIS — C770 Secondary and unspecified malignant neoplasm of lymph nodes of head, face and neck: Secondary | ICD-10-CM

## 2024-09-25 LAB — RAD ONC ARIA SESSION SUMMARY
Course Elapsed Days: 5
Plan Fractions Treated to Date: 4
Plan Prescribed Dose Per Fraction: 2 Gy
Plan Total Fractions Prescribed: 30
Plan Total Prescribed Dose: 60 Gy
Reference Point Dosage Given to Date: 8 Gy
Reference Point Session Dosage Given: 2 Gy
Session Number: 4

## 2024-09-25 MED ORDER — SONAFINE EX EMUL: 1.0000 | Freq: Two times a day (BID) | CUTANEOUS | Status: DC

## 2024-09-25 NOTE — Addendum Note (Signed)
 Encounter addended by: Izell Domino, MD on: 09/25/2024 1:46 PM  Actions taken: Clinical Note Signed

## 2024-09-26 ENCOUNTER — Ambulatory Visit
Admission: RE | Admit: 2024-09-26 | Discharge: 2024-09-26 | Disposition: A | Source: Ambulatory Visit | Attending: Radiation Oncology

## 2024-09-26 ENCOUNTER — Inpatient Hospital Stay: Admitting: Dietician

## 2024-09-26 ENCOUNTER — Telehealth: Payer: Self-pay | Admitting: *Deleted

## 2024-09-26 ENCOUNTER — Other Ambulatory Visit: Payer: Self-pay

## 2024-09-26 DIAGNOSIS — C770 Secondary and unspecified malignant neoplasm of lymph nodes of head, face and neck: Secondary | ICD-10-CM | POA: Diagnosis not present

## 2024-09-26 LAB — RAD ONC ARIA SESSION SUMMARY
Course Elapsed Days: 6
Plan Fractions Treated to Date: 5
Plan Prescribed Dose Per Fraction: 2 Gy
Plan Total Fractions Prescribed: 30
Plan Total Prescribed Dose: 60 Gy
Reference Point Dosage Given to Date: 10 Gy
Reference Point Session Dosage Given: 2 Gy
Session Number: 5

## 2024-09-26 NOTE — Telephone Encounter (Signed)
 Called patient to inform that the nutritionist will see him on 10-03-24 @ 10:30 am and that the telephone fu on 09-29-24 has been cancelled, lvm for a return call

## 2024-09-27 ENCOUNTER — Other Ambulatory Visit: Payer: Self-pay

## 2024-09-27 ENCOUNTER — Ambulatory Visit
Admission: RE | Admit: 2024-09-27 | Discharge: 2024-09-27 | Disposition: A | Discharge status: Home / Self Care | Source: Ambulatory Visit | Attending: Radiation Oncology | Admitting: Radiation Oncology

## 2024-09-27 DIAGNOSIS — C770 Secondary and unspecified malignant neoplasm of lymph nodes of head, face and neck: Secondary | ICD-10-CM | POA: Diagnosis not present

## 2024-09-27 LAB — RAD ONC ARIA SESSION SUMMARY
Course Elapsed Days: 7
Plan Fractions Treated to Date: 6
Plan Prescribed Dose Per Fraction: 2 Gy
Plan Total Fractions Prescribed: 30
Plan Total Prescribed Dose: 60 Gy
Reference Point Dosage Given to Date: 12 Gy
Reference Point Session Dosage Given: 2 Gy
Session Number: 6

## 2024-09-28 ENCOUNTER — Other Ambulatory Visit: Payer: Self-pay

## 2024-09-28 ENCOUNTER — Ambulatory Visit
Admission: RE | Admit: 2024-09-28 | Discharge: 2024-09-28 | Disposition: A | Discharge status: Home / Self Care | Source: Ambulatory Visit | Attending: Radiation Oncology | Admitting: Radiation Oncology

## 2024-09-28 DIAGNOSIS — C770 Secondary and unspecified malignant neoplasm of lymph nodes of head, face and neck: Secondary | ICD-10-CM | POA: Diagnosis not present

## 2024-09-28 LAB — RAD ONC ARIA SESSION SUMMARY
Course Elapsed Days: 8
Plan Fractions Treated to Date: 7
Plan Prescribed Dose Per Fraction: 2 Gy
Plan Total Fractions Prescribed: 30
Plan Total Prescribed Dose: 60 Gy
Reference Point Dosage Given to Date: 14 Gy
Reference Point Session Dosage Given: 2 Gy
Session Number: 7

## 2024-09-29 ENCOUNTER — Ambulatory Visit
Admission: RE | Admit: 2024-09-29 | Discharge: 2024-09-29 | Disposition: A | Source: Ambulatory Visit | Attending: Radiation Oncology

## 2024-09-29 ENCOUNTER — Other Ambulatory Visit: Payer: Self-pay

## 2024-09-29 ENCOUNTER — Inpatient Hospital Stay: Admitting: Dietician

## 2024-09-29 DIAGNOSIS — C770 Secondary and unspecified malignant neoplasm of lymph nodes of head, face and neck: Secondary | ICD-10-CM | POA: Diagnosis not present

## 2024-09-29 LAB — RAD ONC ARIA SESSION SUMMARY
Course Elapsed Days: 9
Plan Fractions Treated to Date: 8
Plan Prescribed Dose Per Fraction: 2 Gy
Plan Total Fractions Prescribed: 30
Plan Total Prescribed Dose: 60 Gy
Reference Point Dosage Given to Date: 16 Gy
Reference Point Session Dosage Given: 2 Gy
Session Number: 8

## 2024-10-02 ENCOUNTER — Other Ambulatory Visit: Payer: Self-pay | Admitting: Radiation Oncology

## 2024-10-02 ENCOUNTER — Ambulatory Visit: Admission: RE | Admit: 2024-10-02 | Discharge: 2024-10-02 | Attending: Radiation Oncology

## 2024-10-02 ENCOUNTER — Other Ambulatory Visit: Payer: Self-pay

## 2024-10-02 ENCOUNTER — Ambulatory Visit
Admission: RE | Admit: 2024-10-02 | Discharge: 2024-10-02 | Disposition: A | Source: Ambulatory Visit | Attending: Radiation Oncology

## 2024-10-02 DIAGNOSIS — C770 Secondary and unspecified malignant neoplasm of lymph nodes of head, face and neck: Secondary | ICD-10-CM | POA: Diagnosis not present

## 2024-10-02 LAB — RAD ONC ARIA SESSION SUMMARY
Course Elapsed Days: 12
Plan Fractions Treated to Date: 9
Plan Prescribed Dose Per Fraction: 2 Gy
Plan Total Fractions Prescribed: 30
Plan Total Prescribed Dose: 60 Gy
Reference Point Dosage Given to Date: 18 Gy
Reference Point Session Dosage Given: 2 Gy
Session Number: 9

## 2024-10-02 MED ORDER — LIDOCAINE VISCOUS HCL 2 % MT SOLN: OROMUCOSAL | 3 refills | Status: AC

## 2024-10-03 ENCOUNTER — Other Ambulatory Visit: Payer: Self-pay

## 2024-10-03 ENCOUNTER — Inpatient Hospital Stay: Admitting: Nutrition

## 2024-10-03 ENCOUNTER — Ambulatory Visit
Admission: RE | Admit: 2024-10-03 | Discharge: 2024-10-03 | Disposition: A | Source: Ambulatory Visit | Attending: Radiation Oncology

## 2024-10-03 DIAGNOSIS — C770 Secondary and unspecified malignant neoplasm of lymph nodes of head, face and neck: Secondary | ICD-10-CM | POA: Diagnosis not present

## 2024-10-03 LAB — RAD ONC ARIA SESSION SUMMARY
Course Elapsed Days: 13
Plan Fractions Treated to Date: 10
Plan Prescribed Dose Per Fraction: 2 Gy
Plan Total Fractions Prescribed: 30
Plan Total Prescribed Dose: 60 Gy
Reference Point Dosage Given to Date: 20 Gy
Reference Point Session Dosage Given: 2 Gy
Session Number: 10

## 2024-10-04 ENCOUNTER — Other Ambulatory Visit: Payer: Self-pay

## 2024-10-04 ENCOUNTER — Other Ambulatory Visit: Payer: Self-pay | Admitting: Radiation Oncology

## 2024-10-04 ENCOUNTER — Ambulatory Visit: Admission: RE | Admit: 2024-10-04 | Discharge: 2024-10-04 | Attending: Radiation Oncology

## 2024-10-04 DIAGNOSIS — C770 Secondary and unspecified malignant neoplasm of lymph nodes of head, face and neck: Secondary | ICD-10-CM | POA: Diagnosis not present

## 2024-10-04 LAB — RAD ONC ARIA SESSION SUMMARY
Course Elapsed Days: 14
Plan Fractions Treated to Date: 11
Plan Prescribed Dose Per Fraction: 2 Gy
Plan Total Fractions Prescribed: 30
Plan Total Prescribed Dose: 60 Gy
Reference Point Dosage Given to Date: 22 Gy
Reference Point Session Dosage Given: 2 Gy
Session Number: 11

## 2024-10-05 ENCOUNTER — Ambulatory Visit: Admission: RE | Admit: 2024-10-05

## 2024-10-05 ENCOUNTER — Other Ambulatory Visit: Payer: Self-pay

## 2024-10-05 DIAGNOSIS — C770 Secondary and unspecified malignant neoplasm of lymph nodes of head, face and neck: Secondary | ICD-10-CM | POA: Diagnosis not present

## 2024-10-05 LAB — RAD ONC ARIA SESSION SUMMARY
Course Elapsed Days: 15
Plan Fractions Treated to Date: 12
Plan Prescribed Dose Per Fraction: 2 Gy
Plan Total Fractions Prescribed: 30
Plan Total Prescribed Dose: 60 Gy
Reference Point Dosage Given to Date: 24 Gy
Reference Point Session Dosage Given: 2 Gy
Session Number: 12

## 2024-10-06 ENCOUNTER — Other Ambulatory Visit: Payer: Self-pay

## 2024-10-06 ENCOUNTER — Ambulatory Visit: Admission: RE | Admit: 2024-10-06 | Discharge: 2024-10-06 | Attending: Radiation Oncology

## 2024-10-06 DIAGNOSIS — C770 Secondary and unspecified malignant neoplasm of lymph nodes of head, face and neck: Secondary | ICD-10-CM | POA: Diagnosis not present

## 2024-10-06 LAB — RAD ONC ARIA SESSION SUMMARY
Course Elapsed Days: 16
Plan Fractions Treated to Date: 13
Plan Prescribed Dose Per Fraction: 2 Gy
Plan Total Fractions Prescribed: 30
Plan Total Prescribed Dose: 60 Gy
Reference Point Dosage Given to Date: 26 Gy
Reference Point Session Dosage Given: 2 Gy
Session Number: 13

## 2024-10-08 ENCOUNTER — Other Ambulatory Visit: Payer: Self-pay

## 2024-10-08 ENCOUNTER — Ambulatory Visit

## 2024-10-08 DIAGNOSIS — C770 Secondary and unspecified malignant neoplasm of lymph nodes of head, face and neck: Secondary | ICD-10-CM | POA: Diagnosis not present

## 2024-10-08 LAB — RAD ONC ARIA SESSION SUMMARY
Course Elapsed Days: 18
Plan Fractions Treated to Date: 14
Plan Prescribed Dose Per Fraction: 2 Gy
Plan Total Fractions Prescribed: 30
Plan Total Prescribed Dose: 60 Gy
Reference Point Dosage Given to Date: 28 Gy
Reference Point Session Dosage Given: 2 Gy
Session Number: 14

## 2024-10-09 ENCOUNTER — Ambulatory Visit

## 2024-10-09 ENCOUNTER — Other Ambulatory Visit: Payer: Self-pay

## 2024-10-09 DIAGNOSIS — C770 Secondary and unspecified malignant neoplasm of lymph nodes of head, face and neck: Secondary | ICD-10-CM | POA: Diagnosis not present

## 2024-10-09 LAB — RAD ONC ARIA SESSION SUMMARY
Course Elapsed Days: 19
Plan Fractions Treated to Date: 15
Plan Prescribed Dose Per Fraction: 2 Gy
Plan Total Fractions Prescribed: 30
Plan Total Prescribed Dose: 60 Gy
Reference Point Dosage Given to Date: 30 Gy
Reference Point Session Dosage Given: 2 Gy
Session Number: 15

## 2024-10-10 ENCOUNTER — Ambulatory Visit
Admission: RE | Admit: 2024-10-10 | Discharge: 2024-10-10 | Disposition: A | Discharge status: Home / Self Care | Source: Ambulatory Visit | Attending: Radiation Oncology | Admitting: Radiation Oncology

## 2024-10-10 ENCOUNTER — Other Ambulatory Visit: Payer: Self-pay

## 2024-10-10 ENCOUNTER — Inpatient Hospital Stay: Admitting: Nutrition

## 2024-10-10 DIAGNOSIS — C770 Secondary and unspecified malignant neoplasm of lymph nodes of head, face and neck: Secondary | ICD-10-CM | POA: Diagnosis not present

## 2024-10-10 LAB — RAD ONC ARIA SESSION SUMMARY
Course Elapsed Days: 20
Plan Fractions Treated to Date: 16
Plan Prescribed Dose Per Fraction: 2 Gy
Plan Total Fractions Prescribed: 30
Plan Total Prescribed Dose: 60 Gy
Reference Point Dosage Given to Date: 32 Gy
Reference Point Session Dosage Given: 2 Gy
Session Number: 16

## 2024-10-11 ENCOUNTER — Ambulatory Visit
Admission: RE | Admit: 2024-10-11 | Discharge: 2024-10-11 | Disposition: A | Source: Ambulatory Visit | Attending: Radiation Oncology

## 2024-10-11 ENCOUNTER — Inpatient Hospital Stay: Admitting: Dietician

## 2024-10-11 ENCOUNTER — Telehealth: Payer: Self-pay | Admitting: Dietician

## 2024-10-11 ENCOUNTER — Other Ambulatory Visit: Payer: Self-pay

## 2024-10-11 DIAGNOSIS — C770 Secondary and unspecified malignant neoplasm of lymph nodes of head, face and neck: Secondary | ICD-10-CM | POA: Diagnosis not present

## 2024-10-11 LAB — RAD ONC ARIA SESSION SUMMARY
Course Elapsed Days: 21
Plan Fractions Treated to Date: 17
Plan Prescribed Dose Per Fraction: 2 Gy
Plan Total Fractions Prescribed: 30
Plan Total Prescribed Dose: 60 Gy
Reference Point Dosage Given to Date: 34 Gy
Reference Point Session Dosage Given: 2 Gy
Session Number: 17

## 2024-10-11 NOTE — Telephone Encounter (Signed)
 Called preferred number for nutrition assessment. Daughter of patient answered and reports she handles appointments for him. Daughter requested to contact patient on his cell. Patient did not answer. Left VM with request for return call. Contact information provided along with 12/30 appointment reminder.

## 2024-10-16 ENCOUNTER — Other Ambulatory Visit: Payer: Self-pay

## 2024-10-16 ENCOUNTER — Ambulatory Visit
Admission: RE | Admit: 2024-10-16 | Discharge: 2024-10-16 | Disposition: A | Discharge status: Home / Self Care | Source: Ambulatory Visit | Attending: Radiation Oncology | Admitting: Radiation Oncology

## 2024-10-16 ENCOUNTER — Ambulatory Visit

## 2024-10-16 DIAGNOSIS — C770 Secondary and unspecified malignant neoplasm of lymph nodes of head, face and neck: Secondary | ICD-10-CM | POA: Diagnosis not present

## 2024-10-16 LAB — RAD ONC ARIA SESSION SUMMARY
Course Elapsed Days: 26
Plan Fractions Treated to Date: 18
Plan Prescribed Dose Per Fraction: 2 Gy
Plan Total Fractions Prescribed: 30
Plan Total Prescribed Dose: 60 Gy
Reference Point Dosage Given to Date: 36 Gy
Reference Point Session Dosage Given: 2 Gy
Session Number: 18

## 2024-10-17 ENCOUNTER — Inpatient Hospital Stay: Admitting: Nutrition

## 2024-10-17 ENCOUNTER — Other Ambulatory Visit: Payer: Self-pay

## 2024-10-17 ENCOUNTER — Ambulatory Visit
Admission: RE | Admit: 2024-10-17 | Discharge: 2024-10-17 | Disposition: A | Discharge status: Home / Self Care | Source: Ambulatory Visit | Attending: Radiation Oncology | Admitting: Radiation Oncology

## 2024-10-17 DIAGNOSIS — C770 Secondary and unspecified malignant neoplasm of lymph nodes of head, face and neck: Secondary | ICD-10-CM | POA: Diagnosis not present

## 2024-10-17 LAB — RAD ONC ARIA SESSION SUMMARY
Course Elapsed Days: 27
Plan Fractions Treated to Date: 19
Plan Prescribed Dose Per Fraction: 2 Gy
Plan Total Fractions Prescribed: 30
Plan Total Prescribed Dose: 60 Gy
Reference Point Dosage Given to Date: 38 Gy
Reference Point Session Dosage Given: 2 Gy
Session Number: 19

## 2024-10-17 NOTE — Progress Notes (Signed)
 Patient did not show up for nutrition appointment.

## 2024-10-18 ENCOUNTER — Other Ambulatory Visit: Payer: Self-pay

## 2024-10-18 ENCOUNTER — Ambulatory Visit
Admission: RE | Admit: 2024-10-18 | Discharge: 2024-10-18 | Disposition: A | Discharge status: Home / Self Care | Source: Ambulatory Visit | Attending: Radiation Oncology | Admitting: Radiation Oncology

## 2024-10-18 DIAGNOSIS — C770 Secondary and unspecified malignant neoplasm of lymph nodes of head, face and neck: Secondary | ICD-10-CM | POA: Diagnosis not present

## 2024-10-18 LAB — RAD ONC ARIA SESSION SUMMARY
Course Elapsed Days: 28
Plan Fractions Treated to Date: 20
Plan Prescribed Dose Per Fraction: 2 Gy
Plan Total Fractions Prescribed: 30
Plan Total Prescribed Dose: 60 Gy
Reference Point Dosage Given to Date: 40 Gy
Reference Point Session Dosage Given: 2 Gy
Session Number: 20

## 2024-10-20 ENCOUNTER — Ambulatory Visit
Admission: RE | Admit: 2024-10-20 | Discharge: 2024-10-20 | Disposition: A | Discharge status: Home / Self Care | Source: Ambulatory Visit | Attending: Radiation Oncology | Admitting: Radiation Oncology

## 2024-10-20 ENCOUNTER — Other Ambulatory Visit: Payer: Self-pay

## 2024-10-20 DIAGNOSIS — Z510 Encounter for antineoplastic radiation therapy: Secondary | ICD-10-CM | POA: Diagnosis present

## 2024-10-20 DIAGNOSIS — C770 Secondary and unspecified malignant neoplasm of lymph nodes of head, face and neck: Secondary | ICD-10-CM | POA: Diagnosis present

## 2024-10-20 LAB — RAD ONC ARIA SESSION SUMMARY
Course Elapsed Days: 30
Plan Fractions Treated to Date: 21
Plan Prescribed Dose Per Fraction: 2 Gy
Plan Total Fractions Prescribed: 30
Plan Total Prescribed Dose: 60 Gy
Reference Point Dosage Given to Date: 42 Gy
Reference Point Session Dosage Given: 2 Gy
Session Number: 21

## 2024-10-22 ENCOUNTER — Ambulatory Visit

## 2024-10-23 ENCOUNTER — Ambulatory Visit
Admission: RE | Admit: 2024-10-23 | Discharge: 2024-10-23 | Disposition: A | Discharge status: Home / Self Care | Source: Ambulatory Visit | Attending: Radiation Oncology | Admitting: Radiation Oncology

## 2024-10-23 ENCOUNTER — Other Ambulatory Visit: Payer: Self-pay

## 2024-10-23 DIAGNOSIS — Z510 Encounter for antineoplastic radiation therapy: Secondary | ICD-10-CM | POA: Diagnosis not present

## 2024-10-23 LAB — RAD ONC ARIA SESSION SUMMARY
Course Elapsed Days: 33
Plan Fractions Treated to Date: 22
Plan Prescribed Dose Per Fraction: 2 Gy
Plan Total Fractions Prescribed: 30
Plan Total Prescribed Dose: 60 Gy
Reference Point Dosage Given to Date: 44 Gy
Reference Point Session Dosage Given: 2 Gy
Session Number: 22

## 2024-10-24 ENCOUNTER — Ambulatory Visit
Admission: RE | Admit: 2024-10-24 | Discharge: 2024-10-24 | Disposition: A | Source: Ambulatory Visit | Attending: Radiation Oncology

## 2024-10-24 ENCOUNTER — Other Ambulatory Visit: Payer: Self-pay

## 2024-10-24 DIAGNOSIS — Z510 Encounter for antineoplastic radiation therapy: Secondary | ICD-10-CM | POA: Diagnosis not present

## 2024-10-24 LAB — RAD ONC ARIA SESSION SUMMARY
Course Elapsed Days: 34
Plan Fractions Treated to Date: 23
Plan Prescribed Dose Per Fraction: 2 Gy
Plan Total Fractions Prescribed: 30
Plan Total Prescribed Dose: 60 Gy
Reference Point Dosage Given to Date: 46 Gy
Reference Point Session Dosage Given: 2 Gy
Session Number: 23

## 2024-10-25 ENCOUNTER — Inpatient Hospital Stay: Attending: Oncology | Admitting: Dietician

## 2024-10-25 ENCOUNTER — Ambulatory Visit
Admission: RE | Admit: 2024-10-25 | Discharge: 2024-10-25 | Disposition: A | Discharge status: Home / Self Care | Source: Ambulatory Visit | Attending: Radiation Oncology | Admitting: Radiation Oncology

## 2024-10-25 ENCOUNTER — Other Ambulatory Visit: Payer: Self-pay

## 2024-10-25 DIAGNOSIS — Z510 Encounter for antineoplastic radiation therapy: Secondary | ICD-10-CM | POA: Diagnosis not present

## 2024-10-25 LAB — RAD ONC ARIA SESSION SUMMARY
Course Elapsed Days: 35
Plan Fractions Treated to Date: 24
Plan Prescribed Dose Per Fraction: 2 Gy
Plan Total Fractions Prescribed: 30
Plan Total Prescribed Dose: 60 Gy
Reference Point Dosage Given to Date: 48 Gy
Reference Point Session Dosage Given: 2 Gy
Session Number: 24

## 2024-10-25 NOTE — Progress Notes (Signed)
 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 He is receiving 6 weeks adjuvant radiation to right neck/submental region. Final RT planned 1/15. Patient under the care of Dr. Izell.   Past medical history includes HTN, CAD s/p CABG, cardiomegaly, OSA, COPD, dyspnea, HLD, urinary incontinence   Met with patient in office after radiation. Reports treatment is getting to him. Says he did not want to come on Monday, but knows how important it is to complete plan. Appetite is poor secondary to taste changes. His mouth and thick saliva is dry making breads difficult to eat. Patient has not started baking soda salt water  gargles yet. He is agreeable to begin today. Wife is encouraging oral intake and has started making him shakes. Says he did well with vanilla milkshake. This tasted good. Patient also liked the banana pie wife prepared over the weekend. He is drinking lots of water . Recalls large Lion[employer] mug that he keeps filled. Patient denies nausea. He is constipated. Last BM was a week ago. Tried prune juice last night, but this was not helpful.   Nutrition Focused Physical Exam: deferred   Medications: amlodipine , D3, plavix , zetia , apresoline , lidocaine , lopressor , oxybutynin , pyridium , crestor , B12   Labs: no recent labs for review    Anthropometrics:   Height: 6' Weight: 264 lb (1/5 - aria) UBW: 275-280 lb (October 2025) BMI: 35.8   NUTRITION DIAGNOSIS: Inadequate oral intake related to [facility] and associated treatment related side effects as evidenced by pt report, altered taste, dry mouth, 4.7% wt loss in 2 weeks which is severe for time frame. Pt weighed 277 lb redia) on 12/24   INTERVENTION:  Educated on rationale for baking  soda salt water  gargles - recommend rinsing several times daily - handout with recipe provided Educated on strategies for taste changes, encouraged leaning into flavor profiles that are tolerable and suggested trying baking soda salt water  rinses prior to po - handout with tips provided Encourage small frequent meals vs attempting larger meals. Educated on soft moist foods for ease of intake - shake recipes, snack ideas, soft moist protein foods list provided Pt does like milk, suggested mixing with CIB powder for added calories and protein - recommend 2-3/day if tolerated - sample packets + Boost [employer] samples provided for pt to try Recommend bowel regimen - miralax  2x/day Contact information provided     MONITORING, EVALUATION, GOAL: Pt will tolerate increased calories and protein to minimize further wt loss during treatment    Next Visit: Wednesday January 14 after radiation - pt aware

## 2024-10-26 ENCOUNTER — Other Ambulatory Visit: Payer: Self-pay

## 2024-10-26 ENCOUNTER — Ambulatory Visit
Admission: RE | Admit: 2024-10-26 | Discharge: 2024-10-26 | Disposition: A | Discharge status: Home / Self Care | Source: Ambulatory Visit | Attending: Radiation Oncology | Admitting: Radiation Oncology

## 2024-10-26 ENCOUNTER — Ambulatory Visit: Attending: Radiation Oncology | Admitting: Physical Therapy

## 2024-10-26 ENCOUNTER — Encounter: Payer: Self-pay | Admitting: Physical Therapy

## 2024-10-26 DIAGNOSIS — C4492 Squamous cell carcinoma of skin, unspecified: Secondary | ICD-10-CM

## 2024-10-26 DIAGNOSIS — R293 Abnormal posture: Secondary | ICD-10-CM | POA: Insufficient documentation

## 2024-10-26 DIAGNOSIS — C770 Secondary and unspecified malignant neoplasm of lymph nodes of head, face and neck: Secondary | ICD-10-CM

## 2024-10-26 DIAGNOSIS — Z483 Aftercare following surgery for neoplasm: Secondary | ICD-10-CM | POA: Insufficient documentation

## 2024-10-26 DIAGNOSIS — Z510 Encounter for antineoplastic radiation therapy: Secondary | ICD-10-CM | POA: Diagnosis not present

## 2024-10-26 LAB — RAD ONC ARIA SESSION SUMMARY
Course Elapsed Days: 36
Plan Fractions Treated to Date: 25
Plan Prescribed Dose Per Fraction: 2 Gy
Plan Total Fractions Prescribed: 30
Plan Total Prescribed Dose: 60 Gy
Reference Point Dosage Given to Date: 50 Gy
Reference Point Session Dosage Given: 2 Gy
Session Number: 25

## 2024-10-26 NOTE — Therapy (Addendum)
 " OUTPATIENT PHYSICAL THERAPY HEAD AND NECK BASELINE EVALUATION   Patient Name: Sean Gardner MRN: [id number] DOB:[birth year], 69 y.o., male Today's Date: 10/26/2024  END OF SESSION:  PT End of Session - 10/26/24 1551     Visit Number 1    Number of Visits 2    Date for Recertification  12/07/24    PT Start Time 1502    PT Stop Time 1538    PT Time Calculation (min) 36 min    Activity Tolerance Patient tolerated treatment well    Behavior During Therapy WFL for tasks assessed/performed          Past Medical History:  Diagnosis Date   Adenomatous colon polyp    CAD (coronary artery disease)    Claudication in peripheral vascular disease    COPD (chronic obstructive pulmonary disease) (HCC)    GERD (gastroesophageal reflux disease)    Hyperlipidemia    Hypertension    Lung cancer (HCC)    neuroendocrine lung ca dx 2010   Myocardial infarction [facility]) 07/2000   Peripheral vascular disease    Shortness of breath    on exertion,can't lay on his back   Skin cancer    arms/face   Sleep apnea    uses CPAP nightly   Past Surgical History:  Procedure Laterality Date   ACHILLES TENDON SURGERY Left    ANTERIOR HIP REVISION Right 11/13/2020   Procedure: IRRIGATION AND DEBRIDEMENT RIGHT HIP;  Surgeon: Jerri Kay HERO, MD;  Location: [facility] OR;  Service: Orthopedics;  Laterality: Right;   COLONOSCOPY  10/02/2022   CORONARY ARTERY BYPASS GRAFT  08/18/2000   LIMA--dLIMA, RIMA-dRCA, left RA-OM2, SVG-D2   FLEXIBLE SIGMOIDOSCOPY N/A 01/18/2013   Procedure: FLEXIBLE SIGMOIDOSCOPY;  Surgeon: Elsie Cree, MD;  Location: [facility];  Service: Endoscopy;  Laterality: N/A;   HOT HEMOSTASIS N/A 01/18/2013   Procedure: HOT HEMOSTASIS (ARGON PLASMA COAGULATION/BICAP);  Surgeon: Elsie Cree, MD;  Location: [facility];  Service: Endoscopy;  Laterality: N/A;   INCISION AND DRAINAGE HIP Right 12/05/2020   Procedure: IRRIGATION AND DEBRIDEMENT RIGHT HIP;  Surgeon: Jerri Kay HERO, MD;  Location: [facility]  OR;  Service: Orthopedics;  Laterality: Right;   INGUINAL HERNIA REPAIR Bilateral    inguinal   LEFT HEART CATH AND CORS/GRAFTS ANGIOGRAPHY N/A 07/21/2022   Procedure: LEFT HEART CATH AND CORS/GRAFTS ANGIOGRAPHY;  Surgeon: Ladona Heinz, MD;  Location: [facility];  Service: Cardiovascular;  Laterality: N/A;   LUNG SURGERY Left 01/29/2009   left lower lobectomy    LYMPH NODE BIOPSY Right 07/07/2024   Procedure: LYMPH NODE BIOPSY;  Surgeon: [name], MD;  Location: [facility] OR;  Service: ENT;  Laterality: Right;  Direct Laryngoscopy with possible biopsy, Right submandibular gland resection, right neck cervical lymph node biopsy, possible right neck dissection   RADICAL NECK DISSECTION Right 07/07/2024   Procedure: RIGHT NECK RADICAL DISSECTION;  Surgeon: [name], MD;  Location: [facility] OR;  Service: ENT;  Laterality: Right;   SKIN BIOPSY Left 07/07/2024   Procedure: LEFT LOWER LIP BIOPSY;  Surgeon: [name], MD;  Location: [facility] OR;  Service: ENT;  Laterality: Left;   SUBMANDIBULAR GLAND EXCISION Right 07/07/2024   Procedure: RIGHT SUBMANDIBULAR GLAND EXCISION;  Surgeon: [name], MD;  Location: [facility] OR;  Service: ENT;  Laterality: Right;   TOTAL HIP ARTHROPLASTY Right 09/16/2020   Procedure: RIGHT TOTAL HIP ARTHROPLASTY ANTERIOR APPROACH;  Surgeon: Jerri Kay HERO, MD;  Location: [facility] OR;  Service: Orthopedics;  Laterality:  Right;   UPPER GI ENDOSCOPY  10/02/2022   Patient Active Problem List   Diagnosis Date Noted   Secondary malignant neoplasm of cervical lymph node (HCC) 08/30/2024   Hx of cancer of lung 08/14/2024   Recurrent squamous cell carcinoma of skin 08/10/2024   Urinary incontinence 07/23/2024   Cardiomegaly 07/22/2024   COPD exacerbation (HCC) 07/21/2024   Chronic health problem 07/21/2024   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) 07/08/2024   Lesion of vocal fold 07/08/2024   Lip lesion 07/08/2024   S/P lymph node biopsy  07/07/2024   Mass of right side of neck 07/07/2024   Abnormal nuclear stress test    Hx of CABG    Staphylococcus aureus infection    Status post total hip replacement, right 11/13/2020   Postoperative wound infection of right hip 11/13/2020   Status post total replacement of right hip 09/16/2020   Hyperkalemia 01/10/2020   Sepsis secondary to UTI (HCC) 05/11/2016   CAD (coronary artery disease) 05/11/2016   Hyperlipidemia 05/11/2016   Morbid (severe) obesity due to excess calories (HCC) 03/21/2016   Sleep apnea 09/18/2015   Dyspnea and respiratory abnormality 09/18/2015   Dyspnea 10/11/2012   Essential hypertension 10/11/2012    PCP: [facility]  REFERRING PROVIDER: Izell Domino, MD  REFERRING DIAG: C77.0 (ICD-10-CM) - Secondary malignant neoplasm of cervical lymph node (HCC) C44.92 (ICD-10-CM) - Recurrent squamous cell carcinoma of skin  THERAPY DIAG:  Abnormal posture  Secondary malignant neoplasm of cervical lymph node (HCC)  Recurrent squamous cell carcinoma of skin  Rationale for Evaluation and Treatment: Rehabilitation  ONSET DATE: 07/07/24  SUBJECTIVE:     SUBJECTIVE STATEMENT: Patient reports they are here today to be seen by their medical team for newly diagnosed cancer of cervical lymph node.    PERTINENT HISTORY:  He presented in May of 2025 with complains of a neck mass. He was then evaluated at [facility] and had CT of the neck on 03/29/2024 showing a right cystic nodule/mass in close proximity to right submandibular gland and a right parotid nodule measuring 1 cm. He was then referred to Dr. 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 Right submandibular lesion pathology showed atypical cells, with keratinized debris - possible epidermal inclusion cyst v/s SCCa. A neck CT performed on 06/12/24 showed a right 3x3 cm cystic and solid mass in submandibular region. No other adenopathy noted, unable to  appreciate any obvious source from proximal airway if carcinoma. Subsequently, patient underwent a right modified radical neck dissection with wedge excision of the left lower lip with primary closure, direct laryngoscopy with biopsy on 07/07/24 under the care of Dr. Tobie. Pathology from left lower lip came back positive for invasive squamous cell carcinoma with ulcer, moderately differentiated, keratinizing. Right neck mass excision also showed invasive squamous cell carcinoma, moderately differentiated, keratinizing. Carcinoma arises from squamous cell carcinoma in situ in cystic structure. 1 lymph node was positive for carcinoma with direct extension. Surgical margin of resection was negative for carcinoma. Right sided submandibular gland was excised and it showed benign salivary gland tissue. 32 lymph nodes from right neck dissection were negative for carcinoma. Excision of left lower lip showed squamous cell carcinoma in situ with carcinoma extending to mucosal margins. Right vocal fold biopsy showed polyp, negative for malignancy.  He will receive radiation to his right neck and started on 09/18/24 and will complete on 11/02/24. History of  left lung neuroendocrine carcinoma s/p resection in 2010 followed by adjuvant chemotherapy and recurrent squamous cell carcinoma of the skin of face s/p multiple excisions and cryotherapy. has a past medical history of Adenomatous colon polyp, CAD (coronary artery disease), Claudication in peripheral vascular disease, COPD (chronic obstructive pulmonary disease) , GERD (gastroesophageal reflux disease), Hyperlipidemia, Hypertension, Lung cancer, Myocardial infarction (07/2000), Shortness of breath, Skin cancer, and Sleep apnea.  Pt will complete radiation on 11/02/24 and then begin immunotherapy every 3 weeks for up to 2 years. Sleeps with CPAP machine.   PATIENT GOALS:   to be educated about the signs and symptoms of lymphedema and learn post op HEP.   PAIN:  Are you  having pain? No  PRECAUTIONS: Active CA, Joint replacement, and Comment (R THA)  RED FLAGS: None   WEIGHT BEARING RESTRICTIONS: No  FALLS:  Has patient fallen in last 6 months? No Does the patient have a fear of falling that limits activity? No Is the patient reluctant to leave the house due to a fear of falling?No  LIVING ENVIRONMENT: Patient lives with: wife Lives in: House/apartment Has following equipment at home: Single point cane, Environmental Consultant - 4 wheeled, Wheelchair (manual), shower chair, Shower bench, and bed side commode  OCCUPATION: retired  LEISURE: does not exercise  PRIOR LEVEL OF FUNCTION: Independent   OBJECTIVE: Note: Objective measures were completed at Evaluation unless otherwise noted.  COGNITION: Overall cognitive status: Within functional limits for tasks assessed                  POSTURE:  Forward head and rounded shoulders posture  30 SEC SIT TO STAND: Not tested due to pt's COPD - pt states he will get short of breath and has been unable to use his CPAP machine  SHOULDER AROM:   WFL   CERVICAL AROM:   Percent limited  Flexion WFL  Extension 50% limited since surgery  Right lateral flexion WFL  Left lateral flexion 25% limited  Right rotation 25% limited  Left rotation 50% limited    (Blank rows=not tested)  Neck Disability Index score: 12 / 50 = 24.0 %  PATIENT EDUCATION:  Education details: Neck ROM, importance of posture when sitting, standing and lying down, deep breathing, walking program and importance of staying active throughout treatment, CURE article on staying active, Why exercise? flyer, lymphedema and PT info Person educated: Patient Education method: Explanation, Demonstration, Handout Education comprehension: Patient verbalized understanding and returned demonstration  HOME EXERCISE PROGRAM: Patient was instructed today in a home exercise program today for head and neck range of motion exercises. These included active  cervical flexion, active cervical extension, active cervical rotation to each direction, upper trap stretch, and shoulder retraction. Patient was encouraged to do these 2-3 times a day, holding for 5 sec each and completing for 5 reps. Pt was educated that once this becomes easier then hold the stretches for 30-60 seconds.    ASSESSMENT:  CLINICAL IMPRESSION: Pt arrives to PT with recently diagnosed secondary malignant neoplasm of cervical lymph node. Pt will undergo radiation. He started treatment on 09/18/24 and complete on 11/02/24.  Pt's cervical ROM was limited in all direction except for flexion and he reports it has been that way since his neck dissection surgery. Educated pt about signs and symptoms of lymphedema as well as anatomy and physiology of lymphatic system. Educated pt in importance of staying as active as possible throughout treatment to decrease fatigue as well as head and neck ROM exercises  to decrease loss of ROM. Will see pt after completion of radiation to reassess ROM and assess for lymphedema and to determine therapy needs at that time.  Pt will benefit from skilled therapeutic intervention to improve on the following deficits: Decreased knowledge of precautions and postural dysfunction. Other deficits: decreased knowledge of condition, decreased knowledge of use of DME, decreased ROM, increased fascial restrictions, and postural dysfunction  PT treatment/interventions: ADL/self-care home management, pt/family education, therapeutic exercise. Other interventions [id number]- PT Re-evaluation, 97110-Therapeutic exercises, 97530- Therapeutic activity, [id number]- Neuromuscular re-education, 97535- Self Care, [id number]- Manual therapy, 97760- Orthotic Initial, [phone]- Orthotic/Prosthetic subsequent, and Patient/Family education  REHAB POTENTIAL: Good  CLINICAL DECISION MAKING: Evolving/moderate complexity  EVALUATION COMPLEXITY: Moderate   GOALS: Goals reviewed with patient? YES  LONG TERM  GOALS: (STG=LTG)   Name Target Date  Goal status  1 Patient will be able to verbalize understanding of a home exercise program for cervical range of motion, posture, and walking.   Baseline:  No knowledge 10/26/2024 Achieved at eval  2 Patient will be able to verbalize understanding of proper sitting and standing posture. Baseline:  No knowledge 10/26/2024 Achieved at eval  3 Patient will be able to verbalize understanding of lymphedema risk and availability of treatment for this condition Baseline:  No knowledge 10/26/2024 Achieved at eval  4 Pt will demonstrate a return to full cervical ROM and function post operatively compared to baselines and not demonstrate any signs or symptoms of lymphedema.  Baseline: See objective measurements taken today. 12/07/24 New    PLAN:  PT FREQUENCY/DURATION: EVAL and 1 follow up appointment.   PLAN FOR NEXT SESSION: will reassess 2 weeks after completion of radiation to determine needs.  Patient will follow up at outpatient cancer rehab 2 weeks after completion of radiation.  If the patient requires physical therapy at that time, a specific plan will be dictated and sent to the referring physician for approval. The patient was educated today on appropriate basic range of motion exercises to begin now and continue throughout radiation and educated on the signs and symptoms of lymphedema. Patient verbalized good understanding.     Physical Therapy Information for During and After Head/Neck Cancer Treatment: Lymphedema is a swelling condition that you may be at risk for in your neck and/or face if you have radiation treatment to the area and/or if you have surgery that includes removing lymph nodes.  There is treatment available for this condition and it is not life-threatening.  Contact your physician or physical therapist with concerns. An excellent resource for those seeking information on lymphedema is the National Lymphedema Network's website.  It can be  accessed at www.lymphnet.org If you notice swelling in your neck or face at any time following surgery (even if it is many years from now), please contact your doctor or physical therapist to discuss this.  Lymphedema can be treated at any time but it is easier for you if it is treated early on. If you have had surgery to your neck, please check with your surgeon about how soon to start doing neck range of motion exercises.  If you are not having surgery, I encourage you to start doing neck range of motion exercises today and continue these while undergoing treatment, UNLESS you have irritation of your skin or soft tissue that is aggravated by doing them.  These exercises are intended to help you prevent loss of range of motion and/or to gain range of motion in your neck (which can be limited by  tightening effects of radiation), and NOT to aggravate these tissues if they develop sensitivities from treatment. Neck range of motion exercises should be done to the point of feeling a GENTLE, TOLERABLE stretch only.  You are encouraged to start a walking or other exercise program tomorrow and continue this as much as you are able through and after treatment.  Please feel free to call me with any questions. Florina Lanis Carbon, PT, CLT Physical Therapist and Certified Lymphedema Therapist [facility] [address], [place], [place] [address] [phone] [email or web address]  WALKING  Walking is a great form of exercise to increase your strength, endurance and overall fitness.  A walking program can help you start slowly and gradually build endurance as you go.  Everyone's ability is different, so each person's starting point will be different.  You do not have to follow them exactly.  The are just samples. You should simply find out what's right for you and stick to that program.   In the beginning, you'll start off walking 2-3 times a day for short distances.   As you get stronger, you'll be walking further at just 1-2 times per day.  A. You Can Walk For A Certain Length Of Time Each Day    Walk 5 minutes 3 times per day.  Increase 2 minutes every 2 days (3 times per day).  Work up to 25-30 minutes (1-2 times per day).   Example:   Day 1-2 5 minutes 3 times per day   Day 7-8 12 minutes 2-3 times per day   Day 13-14 25 minutes 1-2 times per day  B. You Can Walk For a Certain Distance Each Day     Distance can be substituted for time.    Example:   3 trips to mailbox (at road)   3 trips to corner of block   3 trips around the block  C. Go to local high school and use the track.    Walk for distance ____ around track  Or time ____ minutes  D. Walk ____ Jog ____ Run ___   Why exercise?  So many benefits! Here are SOME of them: Heart health, including raising your good cholesterol level and reducing heart rate and blood pressure Lung health, including improved lung capacity It burns fats, and most of us  can stand to be leaner, whether or not we are overweight. It increases the body's natural painkillers and mood elevators, so makes you feel better. Not only makes you feel better, but look better too Improves sleep Takes a bite out of stress May decrease your risk of many types of cancer If you are currently undergoing cancer treatment, exercise may improve your ability to tolerate treatments including chemotherapy. For everybody, it can improve your energy level. Those with cancer-related fatigue report a 40-50% reduction in this symptom when exercising regularly. If you are a survivor of breast, colon, or prostate cancer, it may decrease your risk of a recurrence. (This may hold for other cancers too, but so far we have data just for these three types.)  How to exercise: Get your doctor's okay. Pick something you enjoy doing, like walking, Zumba, biking, swimming, or whatever. Start at low intensity and time, then gradually  increase.  (See walking program handout.) Set a goal to achieve over time.  The American Cancer Society, American Heart Association, and U.S. Dept. of Health and Human Services recommend 150 minutes of moderate exercise, 75 minutes of vigorous exercise, or a  combination of both per week. This should be done in episodes at least 10 minutes long, spread throughout the week.  Need help being motivated? Pick something you enjoy doing, because you'll be more inclined to stick with that activity than something that feels like a chore. Do it with a friend so that you are accountable to each other. Schedule it into your day. Place it on your calendar and keep that appointment just like you do any appointment that you make. Join an exercise group that meets at a specific time.  That way, you have to show up on time, and that makes it harder to procrastinate about doing your workout.  It also keeps you accountable--people begin to expect you to be there. Join a gym where you feel comfortable and not intimidated, at the right cost. Sign up for something that you'll need to be in shape for on a specific date, like a 1K or a 5K to walk or run, a 20 or 30 mile bike ride, a mud run or something like that. If the date is looming, you know you'll need to train to be ready for it.  An added benefit is that many of these are fundraisers for good causes. If you've already paid for a gym membership, group exercise class or event, you might as well work out, so you haven't wasted your money!    Florina Sever [place], PT 10/26/2024, 3:52 PM                       "

## 2024-10-27 ENCOUNTER — Other Ambulatory Visit: Payer: Self-pay

## 2024-10-27 ENCOUNTER — Ambulatory Visit
Admission: RE | Admit: 2024-10-27 | Discharge: 2024-10-27 | Disposition: A | Source: Ambulatory Visit | Attending: Radiation Oncology

## 2024-10-27 DIAGNOSIS — Z510 Encounter for antineoplastic radiation therapy: Secondary | ICD-10-CM | POA: Diagnosis not present

## 2024-10-27 LAB — RAD ONC ARIA SESSION SUMMARY
Course Elapsed Days: 37
Plan Fractions Treated to Date: 26
Plan Prescribed Dose Per Fraction: 2 Gy
Plan Total Fractions Prescribed: 30
Plan Total Prescribed Dose: 60 Gy
Reference Point Dosage Given to Date: 52 Gy
Reference Point Session Dosage Given: 2 Gy
Session Number: 26

## 2024-10-30 ENCOUNTER — Other Ambulatory Visit: Payer: Self-pay | Admitting: Oncology

## 2024-10-30 ENCOUNTER — Other Ambulatory Visit: Payer: Self-pay

## 2024-10-30 ENCOUNTER — Ambulatory Visit: Admission: RE | Admit: 2024-10-30 | Discharge: 2024-10-30 | Attending: Radiation Oncology

## 2024-10-30 ENCOUNTER — Ambulatory Visit
Admission: RE | Admit: 2024-10-30 | Discharge: 2024-10-30 | Disposition: A | Source: Ambulatory Visit | Attending: Radiation Oncology

## 2024-10-30 ENCOUNTER — Telehealth: Payer: Self-pay | Admitting: Oncology

## 2024-10-30 DIAGNOSIS — Z510 Encounter for antineoplastic radiation therapy: Secondary | ICD-10-CM | POA: Diagnosis not present

## 2024-10-30 DIAGNOSIS — C770 Secondary and unspecified malignant neoplasm of lymph nodes of head, face and neck: Secondary | ICD-10-CM

## 2024-10-30 DIAGNOSIS — C4492 Squamous cell carcinoma of skin, unspecified: Secondary | ICD-10-CM

## 2024-10-30 LAB — RAD ONC ARIA SESSION SUMMARY
Course Elapsed Days: 40
Plan Fractions Treated to Date: 27
Plan Prescribed Dose Per Fraction: 2 Gy
Plan Total Fractions Prescribed: 30
Plan Total Prescribed Dose: 60 Gy
Reference Point Dosage Given to Date: 54 Gy
Reference Point Session Dosage Given: 2 Gy
Session Number: 27

## 2024-10-30 MED ORDER — PROCHLORPERAZINE MALEATE 10 MG PO TABS: 10.0000 mg | ORAL_TABLET | Freq: Four times a day (QID) | ORAL | 1 refills | Status: AC | PRN

## 2024-10-30 MED ORDER — ONDANSETRON HCL 8 MG PO TABS: 8.0000 mg | ORAL_TABLET | Freq: Three times a day (TID) | ORAL | 1 refills | Status: AC | PRN

## 2024-10-30 MED ORDER — SONAFINE EX EMUL
1.0000 | Freq: Once | CUTANEOUS | Status: AC
  Administered 2024-10-30: 1 via TOPICAL

## 2024-10-30 NOTE — Telephone Encounter (Signed)
 I spoke with patient and he is aware of chemo education on 11/03/2024 and appointments on 11/22/2024.

## 2024-10-31 ENCOUNTER — Ambulatory Visit
Admission: RE | Admit: 2024-10-31 | Discharge: 2024-10-31 | Disposition: A | Source: Ambulatory Visit | Attending: Radiation Oncology

## 2024-10-31 ENCOUNTER — Other Ambulatory Visit: Payer: Self-pay

## 2024-10-31 DIAGNOSIS — Z510 Encounter for antineoplastic radiation therapy: Secondary | ICD-10-CM | POA: Diagnosis not present

## 2024-10-31 LAB — RAD ONC ARIA SESSION SUMMARY
Course Elapsed Days: 41
Plan Fractions Treated to Date: 28
Plan Prescribed Dose Per Fraction: 2 Gy
Plan Total Fractions Prescribed: 30
Plan Total Prescribed Dose: 60 Gy
Reference Point Dosage Given to Date: 56 Gy
Reference Point Session Dosage Given: 2 Gy
Session Number: 28

## 2024-11-01 ENCOUNTER — Inpatient Hospital Stay: Admitting: Dietician

## 2024-11-01 ENCOUNTER — Other Ambulatory Visit: Payer: Self-pay

## 2024-11-01 ENCOUNTER — Ambulatory Visit
Admission: RE | Admit: 2024-11-01 | Discharge: 2024-11-01 | Disposition: A | Discharge status: Home / Self Care | Source: Ambulatory Visit | Attending: Radiation Oncology | Admitting: Radiation Oncology

## 2024-11-01 DIAGNOSIS — Z510 Encounter for antineoplastic radiation therapy: Secondary | ICD-10-CM | POA: Diagnosis not present

## 2024-11-01 LAB — RAD ONC ARIA SESSION SUMMARY
Course Elapsed Days: 42
Plan Fractions Treated to Date: 29
Plan Prescribed Dose Per Fraction: 2 Gy
Plan Total Fractions Prescribed: 30
Plan Total Prescribed Dose: 60 Gy
Reference Point Dosage Given to Date: 58 Gy
Reference Point Session Dosage Given: 2 Gy
Session Number: 29

## 2024-11-02 ENCOUNTER — Ambulatory Visit

## 2024-11-02 ENCOUNTER — Ambulatory Visit
Admission: RE | Admit: 2024-11-02 | Discharge: 2024-11-02 | Disposition: A | Source: Ambulatory Visit | Attending: Radiation Oncology

## 2024-11-02 ENCOUNTER — Other Ambulatory Visit: Payer: Self-pay

## 2024-11-02 DIAGNOSIS — Z510 Encounter for antineoplastic radiation therapy: Secondary | ICD-10-CM | POA: Diagnosis not present

## 2024-11-02 LAB — RAD ONC ARIA SESSION SUMMARY
Course Elapsed Days: 43
Plan Fractions Treated to Date: 30
Plan Prescribed Dose Per Fraction: 2 Gy
Plan Total Fractions Prescribed: 30
Plan Total Prescribed Dose: 60 Gy
Reference Point Dosage Given to Date: 60 Gy
Reference Point Session Dosage Given: 2 Gy
Session Number: 30

## 2024-11-03 ENCOUNTER — Ambulatory Visit

## 2024-11-03 ENCOUNTER — Inpatient Hospital Stay

## 2024-11-03 NOTE — Radiation Completion Notes (Signed)
 Patient Name: Sean Gardner, Sean Gardner MRN: [id number] Date of Birth: [birth year] Referring Physician: ELDORA BLANCH, M.D. Date of Service: 2024-11-03 Radiation Oncologist: Lauraine Golden, M.D. [facility]                             RADIATION ONCOLOGY END OF TREATMENT NOTE     Diagnosis: C77.0 Secondary and unspecified malignant neoplasm of lymph nodes of head, face and neck Intent: Curative     ==========DELIVERED PLANS==========  First Treatment Date: 2024-09-20 Last Treatment Date: 2024-11-02   Plan Name: HN_R_Neck Site: Neck Right Technique: IMRT Mode: Photon Dose Per Fraction: 2 Gy Prescribed Dose (Delivered / Prescribed): 60 Gy / 60 Gy Prescribed Fxs (Delivered / Prescribed): 30 / 30     ==========ON TREATMENT VISIT DATES========== 2024-09-25, 2024-10-02, 2024-10-09, 2024-10-11, 2024-10-23, 2024-10-30     ==========UPCOMING VISITS========== 01/02/2025 [facility] FOLLOW UP Blanch Eldora NOVAK, MD  11/28/2024 [facility] REH AT [address] [place], [place], [place]  11/22/2024 [facility] NUT 45 Ivonne Harlene RAMAN, [place]  11/22/2024 [facility] INFUSION 2HR (120) [facility] INFUSION  11/22/2024 [facility] EST PT 15 Pasam, Avinash, MD  11/22/2024 [facility] LAB [facility]  11/20/2024 [facility] FOLLOW UP 30 Golden Lauraine, MD  11/03/2024 [facility] PATIENT EDUCATION [facility] CHEMO EDU        ==========APPENDIX - ON TREATMENT VISIT NOTES==========   See weekly On Treatment Notes in Epic for details in the Media tab (listed as Progress notes on the On Treatment Visit Dates listed above).

## 2024-11-06 ENCOUNTER — Ambulatory Visit

## 2024-11-06 NOTE — Progress Notes (Incomplete)
 Pain issues, if any: *** Using a feeding tube?: *** Weight changes, if any: *** Swallowing issues, if any: *** Smoking or chewing tobacco? *** Using fluoride toothpaste daily? *** Last ENT visit was on: *** Other notable issues, if any: ***

## 2024-11-07 ENCOUNTER — Ambulatory Visit

## 2024-11-08 ENCOUNTER — Ambulatory Visit

## 2024-11-09 ENCOUNTER — Ambulatory Visit

## 2024-11-10 ENCOUNTER — Ambulatory Visit

## 2024-11-15 NOTE — Progress Notes (Signed)
 Pharmacist Chemotherapy Monitoring - Initial Assessment    Anticipated start date: 11/22/24   The following has been reviewed per standard work regarding the patient's treatment regimen: The patient's diagnosis, treatment plan and drug doses, and organ/hematologic function Lab orders and baseline tests specific to treatment regimen  The treatment plan start date, drug sequencing, and pre-medications Prior authorization status  Patient's documented medication list, including drug-drug interaction screen and prescriptions for anti-emetics and supportive care specific to the treatment regimen The drug concentrations, fluid compatibility, administration routes, and timing of the medications to be used The patient's access for treatment and lifetime cumulative dose history, if applicable  The patient's medication allergies and previous infusion related reactions, if applicable   Changes made to treatment plan:  N/A  Follow up needed:  N/A  Sean Gardner, RPH, 11/15/2024  2:26 PM

## 2024-11-20 ENCOUNTER — Ambulatory Visit: Admitting: Radiation Oncology

## 2024-11-21 ENCOUNTER — Telehealth: Payer: Self-pay | Admitting: Radiation Oncology

## 2024-11-21 NOTE — Telephone Encounter (Signed)
 Spoke to pt's wife to r/s appt; pt and spouse agreeable to 2/6@1pm .

## 2024-11-21 NOTE — Telephone Encounter (Signed)
 LVM to r/s cx appt from 2/2

## 2024-11-22 ENCOUNTER — Inpatient Hospital Stay: Admitting: Oncology

## 2024-11-22 ENCOUNTER — Inpatient Hospital Stay: Admitting: Dietician

## 2024-11-22 ENCOUNTER — Inpatient Hospital Stay

## 2024-11-22 ENCOUNTER — Inpatient Hospital Stay: Attending: Oncology

## 2024-11-22 ENCOUNTER — Encounter: Payer: Self-pay | Admitting: Oncology

## 2024-11-22 VITALS — BP 131/60 | HR 52 | Temp 98.2°F | Resp 16 | Ht 72.0 in | Wt 251.3 lb

## 2024-11-22 DIAGNOSIS — C4492 Squamous cell carcinoma of skin, unspecified: Secondary | ICD-10-CM

## 2024-11-22 LAB — CMP (CANCER CENTER ONLY)
ALT: 20 U/L (ref 0–44)
AST: 24 U/L (ref 15–41)
Albumin: 4.1 g/dL (ref 3.5–5.0)
Alkaline Phosphatase: 54 U/L (ref 38–126)
Anion gap: 9 (ref 5–15)
BUN: 12 mg/dL (ref 8–23)
CO2: 25 mmol/L (ref 22–32)
Calcium: 10.1 mg/dL (ref 8.9–10.3)
Chloride: 104 mmol/L (ref 98–111)
Creatinine: 0.93 mg/dL (ref 0.61–1.24)
GFR, Estimated: >60 mL/min
Glucose, Bld: 91 mg/dL (ref 70–99)
Potassium: 4 mmol/L (ref 3.5–5.1)
Sodium: 138 mmol/L (ref 135–145)
Total Bilirubin: 0.8 mg/dL (ref 0.0–1.2)
Total Protein: 6.4 g/dL — ABNORMAL LOW (ref 6.5–8.1)

## 2024-11-22 LAB — CBC WITH DIFFERENTIAL (CANCER CENTER ONLY)
Abs Immature Granulocytes: 0.02 10*3/uL (ref 0.00–0.07)
Basophils Absolute: 0 10*3/uL (ref 0.0–0.1)
Basophils Relative: 1 %
Eosinophils Absolute: 0.1 10*3/uL (ref 0.0–0.5)
Eosinophils Relative: 3 %
HCT: 41.9 % (ref 39.0–52.0)
Hemoglobin: 14.4 g/dL (ref 13.0–17.0)
Immature Granulocytes: 0 %
Lymphocytes Relative: 13 %
Lymphs Abs: 0.6 10*3/uL — ABNORMAL LOW (ref 0.7–4.0)
MCH: 32.3 pg (ref 26.0–34.0)
MCHC: 34.4 g/dL (ref 30.0–36.0)
MCV: 93.9 fL (ref 80.0–100.0)
Monocytes Absolute: 0.5 10*3/uL (ref 0.1–1.0)
Monocytes Relative: 10 %
Neutro Abs: 3.7 10*3/uL (ref 1.7–7.7)
Neutrophils Relative %: 73 %
Platelet Count: 162 10*3/uL (ref 150–400)
RBC: 4.46 MIL/uL (ref 4.22–5.81)
RDW: 15.3 % (ref 11.5–15.5)
WBC Count: 5.1 10*3/uL (ref 4.0–10.5)
nRBC: 0 % (ref 0.0–0.2)

## 2024-11-22 LAB — TSH: TSH: 1.79 u[IU]/mL (ref 0.350–4.500)

## 2024-11-22 MED ORDER — SODIUM CHLORIDE 0.9 % IV SOLN
350.0000 mg | Freq: Once | INTRAVENOUS | Status: AC
  Administered 2024-11-22: 350 mg via INTRAVENOUS
  Filled 2024-11-22: qty 7

## 2024-11-22 MED ORDER — SODIUM CHLORIDE 0.9 % IV SOLN: INTRAVENOUS | Status: DC

## 2024-11-22 NOTE — Progress Notes (Signed)
 "  [facility]  ONCOLOGY CLINIC PROGRESS NOTE   Patient Care Team: Clinic, Bonni Lien as PCP - General Ladona Heinz, MD as PCP - Cardiology (Cardiology) [name], MD as Consulting Physician (Otolaryngology) Izell Domino, MD as Attending Physician (Radiation Oncology) Malmfelt, Delon CROME, RN as Oncology Nurse Navigator Autumn Millman, MD as Consulting Physician (Oncology)  PATIENT NAME: Sean Gardner   MR#: [id number] DOB: [birth year]  Date of visit: 11/22/2024   ASSESSMENT & PLAN:   Sean Gardner is a 69 y.o. 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  Completed adjuvant radiation treatments from 09/20/2024 until 11/02/2024, for a total of 60 Gray in 30 fractions. Started systemic treatments with Cemiplimab  from 11/22/2024.  Recurrent squamous cell carcinoma of skin Recurrent squamous cell carcinoma primarily affects the head and neck region, with concern for recurrence and deeper invasion due to anatomical location.   He has undergone multiple excisions and cryotherapy.   Reviewed NCCN guidelines and discussed treatment options.  Given recurrent squamous cell carcinoma of the skin, proposed systemic treatments with Cemiplimab  (Libtayo ).  - Discussed potential immune-related adverse events of Cemiplimab , including hypo/hyperthyroidism, dermatitis, pneumonitis, colitis, hepatitis, and criteria for holding therapy. He completed formal chemo education.   On 08/18/2024, staging PET scan showed no convincing evidence of squamous cell carcinoma skin cancer recurrence. Small right parotid primary  neoplasm.  Plan is to proceed with cemiplimab  treatments, given every 3 weeks intravenously for up to 2 years. He and his family were informed about the treatment plan, benefits, and potential side effects.     He completed adjuvant radiation treatments from 09/20/2024 until 11/02/2024, for a total of 60 Gray in 30 fractions.  Tolerated radiation without major complications. Persistent xerostomia and dysgeusia are present, expected to improve over 1-3 months. Blood counts and organ function are normal. He remains at high risk for recurrence and is scheduled to initiate systemic therapy with Cemiplimab .  - Reviewed post-radiation symptoms and provided reassurance regarding expected recovery timeline for xerostomia and dysgeusia.  - Recommended supportive care for dysgeusia, including hard candy, mints, and maintaining hydration.  Plan is to proceed with systemic treatments with Cemiplimab .  Labs today reveal no dose-limiting toxicities.  Will proceed with cycle 1 of Cemiplimab  today, 11/22/2024.  This will be continued every 3 weeks.  We will arrange for Port-A-Cath placement prior to return visit.  RTC in 3 weeks for labs, follow-up and continuation of Cemiplimab  treatments.    Planned placement of vascular access device He has had difficulty with peripheral venous access. Port placement is planned to facilitate systemic therapy and blood draws. - Requested port placement with interventional radiology. - Discussed that port should be available by next visit.   I reviewed lab results and outside records for this visit and discussed relevant results with the patient. Diagnosis, plan of care and treatment options were also discussed in detail with the patient. Opportunity provided to ask questions and answers provided to his apparent satisfaction. Provided instructions to call our clinic with any problems, questions or concerns prior to return visit. I recommended to continue follow-up with PCP  and sub-specialists. He verbalized understanding and agreed with the  plan.   NCCN guidelines have been consulted in the planning of this patients care.  I spent a total of 42 minutes during this encounter with the patient including review of chart and various tests results, discussions about plan of care and coordination of care plan.   Chinita Patten, MD  11/22/2024 3:48 PM  [facility] - [facility] DE[facility] [address] [place] [address] Dept: [phone] Dept Fax: [phone]    CHIEF COMPLAINT/ REASON FOR VISIT:   Recurrent squamous cell carcinoma of the head and neck area, including lower lip.  Current Treatment:  Completed adjuvant radiation treatments from 09/20/2024 until 11/02/2024, for a total of 60 Gray in 30 fractions.  Started systemic treatments with Cemiplimab  from 11/22/2024.  INTERVAL HISTORY:    Discussed the use of AI scribe software for clinical note transcription with the patient, who gave verbal consent to proceed.  History of Present Illness Sean Gardner is a 69 year old male with recurrent squamous cell carcinoma of the skin who presents for oncology follow-up prior to initiation of systemic therapy.  He completed adjuvant radiation therapy to the head and neck region two weeks ago for recurrent squamous cell carcinoma involving the left lower lip and right neck with regional lymph node involvement. He reports no major skin issues following radiation.  He continues to experience persistent dysgeusia and sialorrhea, which are expected post-radiation effects. He inquired about the timeline for recovery of taste, and supportive measures were discussed.  The patient was previously told he had mild anemia, but his most recent blood work was reported as normal. He denies abnormal bleeding, bruising, fatigue, weight loss, fevers, or nausea. Systemic therapy has not yet been  initiated.  Arrangements for port placement have been initiated.  Aug 10, 2024: Oncology consultation for recurrent squamous cell carcinoma of the skin of the face; recent pathology confirmed invasive squamous cell carcinoma in the left lower lip and right neck mass. Treatment options discussed, including planned radiation therapy and subsequent systemic therapy with Cemiplimab  pending [facility] authorization. Whole-body PET scan requested for staging; arrangements made for Port-A-Cath placement and chemotherapy education.    I have reviewed the past medical history, past surgical history, social history and family history with the patient and they are unchanged from previous note.  HISTORY OF PRESENT ILLNESS:   ONCOLOGY HISTORY:   He has a history of Stage IIA (T1 N1 MX) non-small cell lung cancer, presented with low grade neuroendocrine carcinoma diagnosed in March 2010. Status post left lower lobectomy under the care of Dr. Dusty on 01/29/2009. Status post 4 cycles of adjuvant chemotherapy. First cycle was given with cisplatin and docetaxel, discontinued secondary to intolerance. The patient completed 3 more cycles with carboplatin and docetaxel. Last dose was given 05/27/2009.  He remains on surveillance with no evidence of disease recurrence, last seen by Dr. Sherrod in our clinic in November 2019.   He has a history of multiple skin cancers, including squamous cell carcinoma and melanoma, primarily located on his arms and face. He has undergone several excisions, including a recent surgery on July 07, 2024, and has had lesions frozen during dermatology visits. The lesions are frequently removed but continue to recur, particularly on his face and arms.   He recalls a melanoma diagnosis in the past, possibly on his face, but details are unclear.   He has a significant history of sun exposure due to spending time [place]  [place], and he did not use sunscreen during those times.    He started noticing a right sided neck lump in May 2025.  It was not painful.  He was eventually evaluated at [facility] and had CT of the neck on 03/29/2024: right cystic nodule/mass in close proximity to right submandibular gland (on sagittal there may be a place?), scattered right LAD but no other necrotic nodes noted; right parotid nodule (1cm); no enhancing lesions noted oral cavity/OP/hypopharynx/larynx.   He was referred to Dr. Eldora Blanch. FNA 04/26/2024: Parotid mass - right: Warthin's tumor. Right submandibular lesion: noted atypical cells, with keratinized debris - possible epidermal inclusion cyst v/s SCCa.   CT Neck 06/12/2024 : right 3x3 cm cystic and solid mass from right SMG. No other adenopathy noted, unable to appreciate any obvious source from proximal airway if carcinoma.    On 07/07/2024, Dr. Eldora Blanch performed right modified radical neck dissection, wedge excision of the left lower lip with primary closure, direct laryngoscopy with biopsy.  Pathology from left lower lip came back positive for invasive squamous cell carcinoma with ulcer, moderately differentiated, keratinizing.  Right neck mass excision also showed invasive squamous cell carcinoma, moderately differentiated, keratinizing.  Carcinoma arises from squamous cell carcinoma in situ in cystic structure.  1 lymph node was positive for carcinoma with direct extension.  Surgical margin of resection was negative for carcinoma.  Right sided submandibular gland was excised and it showed benign salivary gland tissue.  32 lymph nodes from right neck dissection were negative for carcinoma.  Excision of left lower lip showed squamous cell carcinoma in situ with carcinoma extending to mucosal margins.  Right vocal fold biopsy showed polyp, negative for malignancy.   With these findings, patient was referred to medical oncology and radiation oncology for further evaluation and management.   Given recurrent squamous cell skin cancers  of head and neck area and elsewhere, proposed systemic treatments with Cemiplimab  and patient is agreeable.  On 08/18/2024, staging PET scan showed no convincing evidence of squamous cell carcinoma skin cancer recurrence. Small right parotid primary neoplasm.   Following [facility] authorization, he completed adjuvant radiation treatments from 09/20/2024 until 11/02/2024, for a total of 60 Gray in 30 fractions.  Started systemic treatments with Cemiplimab  from 11/22/2024.  Oncology History  Recurrent squamous cell carcinoma of skin  08/10/2024 Initial Diagnosis   Recurrent squamous cell carcinoma of skin   11/22/2024 -  Chemotherapy   Patient is on Treatment Plan : ADVANCED CUTANEOUS SQUAMOUS CELL CARCINOMA Cemiplimab  q21d         REVIEW OF SYSTEMS:   Review of Systems - Oncology  All other pertinent systems were reviewed with the patient and are negative.  ALLERGIES: He is allergic to lisinopril  and zolpidem tartrate.  MEDICATIONS:  Current Outpatient Medications  Medication Sig Dispense Refill   amLODipine  (NORVASC ) 10 MG tablet Take 1 tablet (10 mg total) by mouth daily. 90 tablet 1   bacitracin  500 UNIT/GM ointment Apply 1 Application topically 2 (two) times daily. (Patient not taking: Reported on 08/30/2024) 15 g 0   Carboxymethylcellulose Sodium 1 % GEL Apply 1 drop to eye at bedtime as needed (dry eyes). (Patient not taking: Reported on 08/30/2024)     cetirizine (ZYRTEC) 10 MG tablet Take 10 mg by mouth daily as needed for allergies. (Patient not taking: Reported on 08/30/2024)     Cholecalciferol (VITAMIN D3) 25 MCG (1000 UT) CAPS Take 1,000 Units by mouth in the morning and at  bedtime.     clopidogrel  (PLAVIX ) 75 MG tablet Take 1 tablet (75 mg total) by mouth daily. 90 tablet 3   ezetimibe  (ZETIA ) 10 MG tablet Take 1 tablet (10 mg total) by mouth daily after supper. 90 tablet 3   hydrALAZINE  (APRESOLINE ) 50 MG tablet Take 1 tablet (50 mg total) by mouth 3 (three) times daily. 270  tablet 3   ibuprofen  (ADVIL ) 400 MG tablet Take 1 tablet (400 mg total) by mouth every 6 (six) hours as needed for mild pain (pain score 1-3) or moderate pain (pain score 4-6). 30 tablet 0   lidocaine  (XYLOCAINE ) 2 % solution Patient: Mix 1part 2% viscous lidocaine , 1part H20. Swish & swallow 10mL of diluted mixture, 30min before meals and at bedtime, up to QID 200 mL 3   LORazepam  (ATIVAN ) 1 MG tablet Take 1 tablet by mouth 30 minute prior to radiation procedures prn claustrophobia 7 tablet 0   metoprolol  (LOPRESSOR ) 50 MG tablet Take 50 mg by mouth 2 (two) times daily. Hold if systolic blood pressure (top blood pressure number) less than 100 mmHg or heart rate less than 60 bpm (pulse).     NON FORMULARY Pt uses a cpap nightly     ondansetron  (ZOFRAN ) 8 MG tablet Take 1 tablet (8 mg total) by mouth every 8 (eight) hours as needed for nausea or vomiting. 30 tablet 1   oxybutynin  (DITROPAN  XL) 10 MG 24 hr tablet Take 1 tablet (10 mg total) by mouth daily. (Patient not taking: Reported on 08/30/2024) 30 tablet 0   phenazopyridine  (PYRIDIUM ) 100 MG tablet Take 1 tablet (100 mg total) by mouth 3 (three) times daily with meals. (Patient not taking: Reported on 08/30/2024) 10 tablet 0   prochlorperazine  (COMPAZINE ) 10 MG tablet Take 1 tablet (10 mg total) by mouth every 6 (six) hours as needed for nausea or vomiting. 30 tablet 1   rosuvastatin  (CRESTOR ) 40 MG tablet TAKE 1 TABLET BY MOUTH EVERYDAY AT BEDTIME 90 tablet 1   tiotropium (SPIRIVA ) 18 MCG inhalation capsule Place 18 mcg into inhaler and inhale daily.     vitamin B-12 (CYANOCOBALAMIN) 500 MCG tablet Take 500 mcg by mouth in the morning and at bedtime.     No current facility-administered medications for this visit.   Facility-Administered Medications Ordered in Other Visits  Medication Dose Route Frequency Provider Last Rate Last Admin   0.9 %  sodium chloride  infusion   Intravenous Continuous Clayden Withem, MD 10 mL/hr at 11/22/24 1525 New  Bag at 11/22/24 1525   cemiplimab -rwlc (LIBTAYO ) 350 mg in sodium chloride  0.9 % 100 mL chemo infusion  350 mg Intravenous Once Matisse Salais, MD 214 mL/hr at 11/22/24 1534 350 mg at 11/22/24 1534     VITALS:   Blood pressure 131/60, pulse (!) 52, temperature 98.2 F (36.8 C), temperature source Temporal, resp. rate 16, height 6' (1.829 m), weight 251 lb 4.8 oz (114 kg), SpO2 97%.  Wt Readings from Last 3 Encounters:  11/22/24 251 lb 4.8 oz (114 kg)  08/30/24 275 lb 8 oz (125 kg)  08/10/24 280 lb 11.2 oz (127.3 kg)    Body mass index is 34.08 kg/m.    Onc Performance Status - 11/22/24 1500       ECOG Perf Status   ECOG Perf Status Restricted in physically strenuous activity but ambulatory and able to carry out work of a light or sedentary nature, e.g., light house work, office work      KPS SCALE  KPS % SCORE Normal activity with effort, some s/s of disease          PHYSICAL EXAM:   Physical Exam Constitutional:      General: He is not in acute distress.    Appearance: Normal appearance.  HENT:     Head: Normocephalic and atraumatic.  Eyes:     Conjunctiva/sclera: Conjunctivae normal.  Cardiovascular:     Rate and Rhythm: Normal rate and regular rhythm.  Pulmonary:     Effort: Pulmonary effort is normal. No respiratory distress.  Abdominal:     General: There is no distension.  Skin:    Comments: Mild radiation associated skin changes in the neck area, improving  Neurological:     General: No focal deficit present.     Mental Status: He is alert and oriented to person, place, and time.  Psychiatric:        Mood and Affect: Mood normal.        Behavior: Behavior normal.      LABORATORY DATA:   I have reviewed the data as listed.  Results for orders placed or performed in visit on 11/22/24  CMP (Cancer Center only)  Result Value Ref Range   Sodium 138 135 - 145 mmol/L   Potassium 4.0 3.5 - 5.1 mmol/L   Chloride 104 98 - 111 mmol/L   CO2 25 22 - 32  mmol/L   Glucose, Bld 91 70 - 99 mg/dL   BUN 12 8 - 23 mg/dL   Creatinine 9.06 9.38 - 1.24 mg/dL   Calcium  10.1 8.9 - 10.3 mg/dL   Total Protein 6.4 (L) 6.5 - 8.1 g/dL   Albumin  4.1 3.5 - 5.0 g/dL   AST 24 15 - 41 U/L   ALT 20 0 - 44 U/L   Alkaline Phosphatase 54 38 - 126 U/L   Total Bilirubin 0.8 0.0 - 1.2 mg/dL   GFR, Estimated >39 >39 mL/min   Anion gap 9 5 - 15  CBC with Differential (Cancer Center Only)  Result Value Ref Range   WBC Count 5.1 4.0 - 10.5 K/uL   RBC 4.46 4.22 - 5.81 MIL/uL   Hemoglobin 14.4 13.0 - 17.0 g/dL   HCT 58.0 60.9 - 47.9 %   MCV 93.9 80.0 - 100.0 fL   MCH 32.3 26.0 - 34.0 pg   MCHC 34.4 30.0 - 36.0 g/dL   RDW 84.6 88.4 - 84.4 %   Platelet Count 162 150 - 400 K/uL   nRBC 0.0 0.0 - 0.2 %   Neutrophils Relative % 73 %   Neutro Abs 3.7 1.7 - 7.7 K/uL   Lymphocytes Relative 13 %   Lymphs Abs 0.6 (L) 0.7 - 4.0 K/uL   Monocytes Relative 10 %   Monocytes Absolute 0.5 0.1 - 1.0 K/uL   Eosinophils Relative 3 %   Eosinophils Absolute 0.1 0.0 - 0.5 K/uL   Basophils Relative 1 %   Basophils Absolute 0.0 0.0 - 0.1 K/uL   Immature Granulocytes 0 %   Abs Immature Granulocytes 0.02 0.00 - 0.07 K/uL      RADIOGRAPHIC STUDIES:  I have personally reviewed the radiological images as listed and agree with the findings in the report.  NM PET Image Initial (PI) Whole Body EXAM: PET AND CT SKULL BASE TO MID THIGH 08/18/2024 01:40:56 PM  TECHNIQUE:  RADIOPHARMACEUTICAL: 14 mCi F-18 FDG Uptake time 60 minutes. Glucose level 96 mg/dl.  PET imaging was acquired from the base of the skull to  the mid thighs. Non-contrast enhanced computed tomography was obtained for attenuation correction and anatomic localization.  COMPARISON: None available.  CLINICAL HISTORY: Patient with recurrent squamous cell skin cancer. Needs staging.  FINDINGS:  HEAD AND NECK: No metabolically active cervical lymphadenopathy. Small round hepatic lesion is present within  the right parotid gland, measuring 9 mm on image 49. This small rounds nodule is favored to be primary parotid neoplasm.  Within the lateral right neck there is a focus of activity along a skin fold with mild soft tissue thickening measuring 7 mm on image 56. This activity is moderate with SUV max equal to 6.7. Lesion is indeterminate but favors a focus of cellulitis rather than carcinoma. Recommend clinical correlation. No cutaneous lesions are identified on whole body scan.  CHEST: No metabolically active pulmonary nodules. No metabolically active lymphadenopathy. Post CABG anatomy.  ABDOMEN AND PELVIS: No metabolically active lymphadenopathy. Physiologic activity within the gastrointestinal and genitourinary systems. Atherosclerotic calcification of the aorta.  BONES AND SOFT TISSUE: No abnormal FDG activity localizes to the bones. Right hip prosthetic.  IMPRESSION: 1. No convincing evidence of squamous cell carcinoma skin cancer recurrence. 2. Small right parotid primary neoplasm. 3. Hypermetabolic thickening in the lateral right neck favoring cellulitis. Recommend clinical evaluation.  Electronically signed by: Norleen Boxer MD 08/22/2024 09:54 AM EST RP Workstation: [id number]    CODE STATUS:  Code Status History     Date Active Date Inactive Code Status Order ID Comments User Context   07/21/2024 2041 07/23/2024 2047 Full Code [id number]  Lennie Raguel MATSU, DO ED   07/07/2024 1949 07/08/2024 1654 Full Code [id number]  [name], MD Inpatient   07/21/2022 1114 07/21/2022 1903 Full Code [id number]  Ladona Heinz, MD Inpatient   12/05/2020 1827 12/06/2020 2126 Full Code [id number]  Jerri Kay HERO, MD Inpatient   11/13/2020 1945 11/18/2020 1804 Full Code [id number]  Jerri Kay HERO, MD Inpatient   09/16/2020 1150 09/17/2020 2242 Full Code [id number]  Jerri Kay HERO, MD Inpatient   05/11/2016 2033 05/15/2016 1752 Full Code [id number]  Celinda Alm Lot, MD Inpatient    Questions for Most  Recent Historical Code Status (Order [id number])     Question Answer   By: Consent: discussion documented in EHR            Orders Placed This Encounter  Procedures   CBC with Differential (Cancer Center Only)    Standing Status:   Future    Expected Date:   01/03/2025    Expiration Date:   01/03/2026   CMP (Cancer Center only)    Standing Status:   Future    Expected Date:   01/03/2025    Expiration Date:   01/03/2026   T4    Standing Status:   Future    Expected Date:   01/03/2025    Expiration Date:   01/03/2026   TSH    Standing Status:   Future    Expected Date:   01/03/2025    Expiration Date:   01/03/2026     Future Appointments  Date Time Provider Department Center  11/24/2024  1:00 PM Izell Domino, MD [facility] None  11/28/2024 10:00 AM Lanis Carbon, Florina CROME, PT [facility] None  01/02/2025  4:15 PM [name], MD [facility] None     This document was completed utilizing speech recognition software. Grammatical errors, random word insertions, pronoun errors, and incomplete sentences are an occasional consequence of this system due to software limitations, ambient noise, and hardware issues. Any  formal questions or concerns about the content, text or information contained within the body of this dictation should be directly addressed to the provider for clarification.   "

## 2024-11-22 NOTE — Progress Notes (Signed)
 Nutrition Follow-up:  Pt with oncology history significant for left lung neuroendocrine carcinoma s/p resection in 2010 followed by adjuvant chemotherapy and recurrent squamous cell carcinoma of the skin of face s/p multiple excisions and cryotherapy. He completed 6 weeks adjuvant radiation to right neck/submental region. Final RT 1/15 under the care of Dr. Izell. He is currently receiving Libtayo  (first 2/4). Patient is under the care of Dr. Autumn  Met with patient and wife in infusion. Patient doing well today. His appetite is slowly improving, but foods still do not taste good. Reports eating half of steak/cheese yesterday that was very good. He was full after this. Ate sausage egg bowl and a banana for breakfast. Patient drinking high protein shake occasionally per wife. He is drinking tons of water . His mouth is dry. Says it was not too bad this morning. Patient denies nausea or vomiting. Continues to have constipation. Reports abdominal discomfort and urge, but has not had a good movement in a while. He does report small volume liquid stool. Patient is not on bowel regimen.   Medications: reviewed   Labs: reviewed   Anthropometrics: Wt 151 lb 4.8 oz today decreased 3.5% in 3 weeks, 9.4% in the last 6 week - this is severe for time frame  1/12 - 260 lb 6 oz 12/24 - 277 lb    NUTRITION DIAGNOSIS: Inadequate oral intake - continues    INTERVENTION:  Encourage small frequent meals/snacks to increase daily calorie/protein energy intake Suggested resuming baking soda salt water  rinses Recommend daily protein shake, suggested switching to higher calorie shake to support wt maintenance Educated on strategies for constipation - discussed bowel regimen with Dr. Autumn who recommends daily miralax  (pt understanding)     MONITORING, EVALUATION, GOAL: wt trends, intake   NEXT VISIT: To be determined

## 2024-11-22 NOTE — Assessment & Plan Note (Addendum)
 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   He has undergone multiple excisions and cryotherapy.   Reviewed NCCN guidelines and discussed treatment options.  Given recurrent squamous cell carcinoma of the skin, proposed systemic treatments with Cemiplimab  (Libtayo ).  - Discussed potential immune-related adverse events of Cemiplimab , including hypo/hyperthyroidism, dermatitis, pneumonitis, colitis, hepatitis, and criteria for holding therapy. He completed formal chemo education.   On 08/18/2024, staging PET scan showed no convincing evidence of squamous cell carcinoma skin cancer recurrence. Small right parotid primary neoplasm.  Plan is to proceed with cemiplimab  treatments, given every 3 weeks intravenously for up to 2 years. He and his family were informed about the treatment plan, benefits, and potential side effects.     He completed adjuvant radiation treatments from 09/20/2024 until 11/02/2024, for a total of 60 Gray in 30 fractions.  Tolerated radiation without major complications. Persistent xerostomia and dysgeusia are present, expected to improve over 1-3 months. Blood counts and organ function are normal. He remains at high risk for recurrence and is scheduled to initiate systemic therapy with Cemiplimab .  - Reviewed post-radiation symptoms and provided reassurance regarding expected recovery timeline for xerostomia and dysgeusia.  - Recommended supportive care for dysgeusia, including hard candy, mints, and maintaining hydration.  Plan is to proceed with systemic treatments with Cemiplimab .  Labs today reveal no dose-limiting toxicities.  Will proceed with cycle 1 of Cemiplimab  today, 11/22/2024.  This will be continued every 3 weeks.  We will arrange for Port-A-Cath placement prior to return visit.  RTC in 3 weeks for labs, follow-up and continuation of Cemiplimab   treatments.

## 2024-11-22 NOTE — Patient Instructions (Signed)
 [facility] - A DEPT OF [facility]  Discharge Instructions: Thank you for choosing [facility] to provide your oncology and hematology care.   If you have a lab appointment with the Cancer Center, please go directly to the Cancer Center and check in at the registration area.   Wear comfortable clothing and clothing appropriate for easy access to any Portacath or PICC line.   We strive to give you quality time with your provider. You may need to reschedule your appointment if you arrive late (15 or more minutes).  Arriving late affects you and other patients whose appointments are after yours.  Also, if you miss three or more appointments without notifying the office, you may be dismissed from the clinic at the provider's discretion.      For prescription refill requests, have your pharmacy contact our office and allow 72 hours for refills to be completed.    Today you received the following chemotherapy and/or immunotherapy agents libtayo      To help prevent nausea and vomiting after your treatment, we encourage you to take your nausea medication as directed.  BELOW ARE SYMPTOMS THAT SHOULD BE REPORTED IMMEDIATELY: *FEVER GREATER THAN 100.4 F (38 C) OR HIGHER *CHILLS OR SWEATING *NAUSEA AND VOMITING THAT IS NOT CONTROLLED WITH YOUR NAUSEA MEDICATION *UNUSUAL SHORTNESS OF BREATH *UNUSUAL BRUISING OR BLEEDING *URINARY PROBLEMS (pain or burning when urinating, or frequent urination) *BOWEL PROBLEMS (unusual diarrhea, constipation, pain near the anus) TENDERNESS IN MOUTH AND THROAT WITH OR WITHOUT PRESENCE OF ULCERS (sore throat, sores in mouth, or a toothache) UNUSUAL RASH, SWELLING OR PAIN  UNUSUAL VAGINAL DISCHARGE OR ITCHING   Items with * indicate a potential emergency and should be followed up as soon as possible or go to the Emergency Department if any problems should occur.  Please show the CHEMOTHERAPY ALERT CARD or IMMUNOTHERAPY  ALERT CARD at check-in to the Emergency Department and triage nurse.  Should you have questions after your visit or need to cancel or reschedule your appointment, please contact [facility] - A DEPT OF [facility]  Dept: [phone]  and follow the prompts.  Office hours are 8:00 a.m. to 4:30 p.m. Monday - Friday. Please note that voicemails left after 4:00 p.m. may not be returned until the following business day.  We are closed weekends and major holidays. You have access to a nurse at all times for urgent questions. Please call the main number to the clinic Dept: [phone] and follow the prompts.   For any non-urgent questions, you may also contact your provider using MyChart. We now offer e-Visits for anyone 104 and older to request care online for non-urgent symptoms. For details visit [email or web address].   Also download the MyChart app! Go to the app store, search "MyChart", open the app, select [facility], and log in with your MyChart username and password.

## 2024-11-23 ENCOUNTER — Other Ambulatory Visit: Payer: Self-pay

## 2024-11-23 ENCOUNTER — Telehealth: Payer: Self-pay

## 2024-11-23 DIAGNOSIS — C770 Secondary and unspecified malignant neoplasm of lymph nodes of head, face and neck: Secondary | ICD-10-CM

## 2024-11-23 DIAGNOSIS — C4492 Squamous cell carcinoma of skin, unspecified: Secondary | ICD-10-CM

## 2024-11-23 DIAGNOSIS — Z85118 Personal history of other malignant neoplasm of bronchus and lung: Secondary | ICD-10-CM

## 2024-11-23 DIAGNOSIS — C7989 Secondary malignant neoplasm of other specified sites: Secondary | ICD-10-CM

## 2024-11-23 LAB — T4: T4, Total: 6.9 ug/dL (ref 4.5–12.0)

## 2024-11-23 NOTE — Telephone Encounter (Signed)
 LM for patient that this nurse was calling to see how they were doing after their treatment. Please call back to Dr. Zenda Alpers nurse at [phone] if they have any questions or concerns regarding the treatment.

## 2024-11-23 NOTE — Telephone Encounter (Signed)
-----   Message from Nurse Damien ORN, RN sent at 11/22/2024  4:17 PM EST ----- Regarding: first time dr pasam First time libtayo . Tolerated well. Please f/u. Thanks!

## 2024-11-24 ENCOUNTER — Ambulatory Visit: Admission: RE | Admit: 2024-11-24 | Admitting: Radiation Oncology

## 2024-11-24 VITALS — BP 128/65 | HR 59 | Temp 97.7°F | Resp 20 | Ht 72.0 in | Wt 250.6 lb

## 2024-11-24 DIAGNOSIS — C770 Secondary and unspecified malignant neoplasm of lymph nodes of head, face and neck: Secondary | ICD-10-CM

## 2024-11-24 NOTE — Progress Notes (Signed)
" °  Pain issues, if any: None Using a feeding tube?: None Weight changes, if any: 50 pounds lost  Swallowing issues, if any: Sometimes food gets stuck Smoking or chewing tobacco? None Using fluoride toothpaste daily? Yes Last ENT visit was on: March 2026 Other notable issues, if any: None     "

## 2024-11-24 NOTE — Progress Notes (Signed)
 " Radiation Oncology         [phone] ________________________________  Name: Sean Gardner MRN: [id number]  Date: 11/24/2024  DOB: 02-09-1956  Follow-Up Visit Note  CC: [facility], [facility]  Diagnosis and Prior Radiotherapy:       ICD-10-CM   1. 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   Pain issues, if any: None Using a feeding tube?: None Weight changes, if any: He states his weight is starting to stabilize.  He is checking it at home Swallowing issues, if any: Sometimes food gets stuck when swallowing. Smoking or chewing tobacco? None Using fluoride toothpaste daily? Yes He sees otolaryngology next month Other notable issues, if any: He reports that he has thick saliva and dry mouth.  Taste changes persist                        ALLERGIES:  is allergic to lisinopril  and zolpidem tartrate.  Meds: Current Outpatient Medications  Medication Sig Dispense Refill   amLODipine  (NORVASC ) 10 MG tablet Take 1 tablet (10 mg total) by mouth daily. 90 tablet 1   bacitracin  500 UNIT/GM ointment Apply 1 Application topically 2 (two) times daily. (Patient not taking: Reported on 08/30/2024) 15 g 0   Carboxymethylcellulose Sodium 1 % GEL Apply 1 drop to eye at bedtime as needed (dry eyes). (Patient not taking: Reported on 08/30/2024)     cetirizine (ZYRTEC) 10 MG tablet Take 10 mg by mouth daily as needed for  allergies. (Patient not taking: Reported on 08/30/2024)     Cholecalciferol (VITAMIN D3) 25 MCG (1000 UT) CAPS Take 1,000 Units by mouth in the morning and at bedtime.     clopidogrel  (PLAVIX ) 75 MG tablet Take 1 tablet (75 mg total) by mouth daily. 90 tablet 3   ezetimibe  (ZETIA ) 10 MG tablet Take 1 tablet (10 mg total) by mouth daily after supper. 90 tablet 3   hydrALAZINE  (APRESOLINE ) 50 MG tablet Take 1 tablet (50 mg total) by mouth 3 (three) times daily. 270 tablet 3   ibuprofen  (ADVIL ) 400 MG tablet Take 1 tablet (400 mg total) by mouth every 6 (six) hours as needed for mild pain (pain score 1-3) or moderate pain (pain score 4-6). 30 tablet 0   lidocaine  (XYLOCAINE ) 2 % solution Patient: Mix 1part 2% viscous lidocaine , 1part H20. Swish & swallow 10mL of diluted mixture, 30min before meals and at bedtime, up to QID 200 mL 3   LORazepam  (ATIVAN ) 1 MG tablet Take 1 tablet by mouth 30 minute prior to radiation procedures prn claustrophobia 7 tablet 0   metoprolol  (LOPRESSOR ) 50 MG tablet Take 50 mg by mouth 2 (two) times daily. Hold if systolic blood pressure (top blood pressure number) less than 100 mmHg or heart rate less than 60 bpm (pulse).     NON FORMULARY Pt uses a cpap nightly     ondansetron  (ZOFRAN ) 8 MG tablet Take 1 tablet (8  mg total) by mouth every 8 (eight) hours as needed for nausea or vomiting. 30 tablet 1   oxybutynin  (DITROPAN  XL) 10 MG 24 hr tablet Take 1 tablet (10 mg total) by mouth daily. (Patient not taking: Reported on 08/30/2024) 30 tablet 0   phenazopyridine  (PYRIDIUM ) 100 MG tablet Take 1 tablet (100 mg total) by mouth 3 (three) times daily with meals. (Patient not taking: Reported on 08/30/2024) 10 tablet 0   prochlorperazine  (COMPAZINE ) 10 MG tablet Take 1 tablet (10 mg total) by mouth every 6 (six) hours as needed for nausea or vomiting. 30 tablet 1   rosuvastatin  (CRESTOR ) 40 MG tablet TAKE 1 TABLET BY MOUTH EVERYDAY AT BEDTIME 90 tablet 1   tiotropium (SPIRIVA ) 18  MCG inhalation capsule Place 18 mcg into inhaler and inhale daily.     vitamin B-12 (CYANOCOBALAMIN) 500 MCG tablet Take 500 mcg by mouth in the morning and at bedtime.     No current facility-administered medications for this visit.    Physical Findings: The patient is in no acute distress. Patient is alert and oriented. Wt Readings from Last 3 Encounters:  11/22/24 251 lb 4.8 oz (114 kg)  08/30/24 275 lb 8 oz (125 kg)  08/10/24 280 lb 11.2 oz (127.3 kg)    vitals were not taken for this visit. .  General: Alert and oriented, in no acute distress HEENT: Head is normocephalic. Extraocular movements are intact. Oropharynx is notable for no lesions Neck: Neck is notable for no palpable masses.  Stable postoperative changes in the right neck Skin: Skin in treatment fields shows satisfactory healing with patchy hair loss in the right neck Lymphatics: see Neck Exam Psychiatric: Judgment and insight are intact. Affect is appropriate.   Lab Findings: Lab Results  Component Value Date   WBC 5.1 11/22/2024   HGB 14.4 11/22/2024   HCT 41.9 11/22/2024   MCV 93.9 11/22/2024   PLT 162 11/22/2024    Lab Results  Component Value Date   TSH 1.790 11/22/2024    Radiographic Findings: No results found.  Impression/Plan:    + Head and Neck Cancer Status: This patient presented with a submandibular lymph node in his neck which demonstrates squamous cell carcinoma.  This carcinoma invaded adjacent tissue.  It was the only lymph node that was positive when he underwent right neck dissection.  He received from 6 wks of adjuvant radiation therapy to the highest risk lymph nodes in the right neck/submental region, with particular focus on the submandibular lymph node bed, to prevent further recurrences.  He is healing well.  He recently met with medical oncology to discuss adjuvant immunotherapy.  He will proceed with systemic treatments with Cemiplimab  - given every 3 weeks intravenously for up  to 2 years.   + Nutritional Status: He will continue to weigh himself at home.  Taste changes have affected his oral intake.  He knows to continue to push calories and protein.  I suggested he avoid dry meats and crusty breads as these are more likely to get stuck when he swallows.  We talked about using sauces and gravy on his food to make it easier to swallow  + Swallowing: As above  + Dental: Encouraged to continue regular followup with dentistry as indicated, and dental hygiene at home.  He does report dry mouth.  I wrote a list of over-the-counter antidotes for dry mouth and he will try them.  This includes XyliMelts, dry mouth lozenges, artificial saliva supplements, papaya extract tablets  +  Thyroid  function: Checking regularly in medical oncology Lab Results  Component Value Date   TSH 1.790 11/22/2024    + Other: I will ask our navigator to arrange CT of neck and chest in about 2-1/2 months.  He can review those results and medical oncology.  He will follow closely in medical oncology while he gets immunotherapy.  I will see him back in 1 year   On date of service, in total, I spent 25 minutes on this encounter. Patient was seen in person. _____________________________________   Lauraine Golden, MD  "

## 2024-11-28 ENCOUNTER — Ambulatory Visit: Admitting: Physical Therapy

## 2024-12-13 ENCOUNTER — Inpatient Hospital Stay: Admitting: Oncology

## 2024-12-13 ENCOUNTER — Inpatient Hospital Stay

## 2025-01-02 ENCOUNTER — Ambulatory Visit (INDEPENDENT_AMBULATORY_CARE_PROVIDER_SITE_OTHER): Admitting: Otolaryngology

## 2025-01-03 ENCOUNTER — Inpatient Hospital Stay: Admitting: Oncology

## 2025-01-03 ENCOUNTER — Inpatient Hospital Stay: Attending: Oncology

## 2025-01-03 ENCOUNTER — Inpatient Hospital Stay
